# Patient Record
Sex: Male | Born: 1950 | Race: White | Hispanic: No | Marital: Single | Smoking: Current every day smoker
Health system: Southern US, Community
[De-identification: ages and names within clinical notes are randomized; demographics above are authoritative.]

## PROBLEM LIST (undated history)

## (undated) ENCOUNTER — Emergency Department (HOSPITAL_COMMUNITY): Payer: Commercial Managed Care - HMO

## (undated) DIAGNOSIS — B029 Zoster without complications: Secondary | ICD-10-CM

## (undated) DIAGNOSIS — I10 Essential (primary) hypertension: Secondary | ICD-10-CM

## (undated) DIAGNOSIS — F419 Anxiety disorder, unspecified: Secondary | ICD-10-CM

## (undated) DIAGNOSIS — M25519 Pain in unspecified shoulder: Secondary | ICD-10-CM

---

## 1999-02-04 ENCOUNTER — Emergency Department (HOSPITAL_COMMUNITY): Admission: EM | Admit: 1999-02-04 | Discharge: 1999-02-04 | Payer: Self-pay | Admitting: Emergency Medicine

## 2000-10-23 ENCOUNTER — Emergency Department (HOSPITAL_COMMUNITY): Admission: EM | Admit: 2000-10-23 | Discharge: 2000-10-23 | Payer: Self-pay | Admitting: Emergency Medicine

## 2000-11-25 ENCOUNTER — Emergency Department (HOSPITAL_COMMUNITY): Admission: EM | Admit: 2000-11-25 | Discharge: 2000-11-25 | Payer: Self-pay | Admitting: Emergency Medicine

## 2000-11-25 ENCOUNTER — Encounter: Payer: Self-pay | Admitting: Emergency Medicine

## 2000-12-21 ENCOUNTER — Encounter: Payer: Self-pay | Admitting: Emergency Medicine

## 2000-12-21 ENCOUNTER — Emergency Department (HOSPITAL_COMMUNITY): Admission: EM | Admit: 2000-12-21 | Discharge: 2000-12-21 | Payer: Self-pay | Admitting: Emergency Medicine

## 2001-04-03 ENCOUNTER — Encounter: Admission: RE | Admit: 2001-04-03 | Discharge: 2001-04-03 | Payer: Self-pay | Admitting: Internal Medicine

## 2001-11-21 ENCOUNTER — Encounter: Admission: RE | Admit: 2001-11-21 | Discharge: 2001-11-21 | Payer: Self-pay

## 2002-08-10 ENCOUNTER — Emergency Department (HOSPITAL_COMMUNITY): Admission: EM | Admit: 2002-08-10 | Discharge: 2002-08-10 | Payer: Self-pay | Admitting: Emergency Medicine

## 2002-08-10 ENCOUNTER — Encounter: Payer: Self-pay | Admitting: Emergency Medicine

## 2002-08-12 ENCOUNTER — Emergency Department (HOSPITAL_COMMUNITY): Admission: EM | Admit: 2002-08-12 | Discharge: 2002-08-12 | Payer: Self-pay | Admitting: Emergency Medicine

## 2002-08-16 ENCOUNTER — Emergency Department (HOSPITAL_COMMUNITY): Admission: EM | Admit: 2002-08-16 | Discharge: 2002-08-16 | Payer: Self-pay | Admitting: Emergency Medicine

## 2002-09-13 ENCOUNTER — Encounter: Payer: Self-pay | Admitting: Emergency Medicine

## 2002-09-13 ENCOUNTER — Emergency Department (HOSPITAL_COMMUNITY): Admission: EM | Admit: 2002-09-13 | Discharge: 2002-09-13 | Payer: Self-pay | Admitting: Emergency Medicine

## 2002-09-27 ENCOUNTER — Encounter: Payer: Self-pay | Admitting: Emergency Medicine

## 2002-09-27 ENCOUNTER — Emergency Department (HOSPITAL_COMMUNITY): Admission: EM | Admit: 2002-09-27 | Discharge: 2002-09-27 | Payer: Self-pay | Admitting: Emergency Medicine

## 2002-11-08 ENCOUNTER — Encounter: Payer: Self-pay | Admitting: Emergency Medicine

## 2002-11-08 ENCOUNTER — Emergency Department (HOSPITAL_COMMUNITY): Admission: EM | Admit: 2002-11-08 | Discharge: 2002-11-08 | Payer: Self-pay | Admitting: Emergency Medicine

## 2003-07-09 ENCOUNTER — Emergency Department (HOSPITAL_COMMUNITY): Admission: EM | Admit: 2003-07-09 | Discharge: 2003-07-09 | Payer: Self-pay | Admitting: Emergency Medicine

## 2003-07-09 ENCOUNTER — Encounter: Payer: Self-pay | Admitting: Emergency Medicine

## 2003-11-21 ENCOUNTER — Emergency Department (HOSPITAL_COMMUNITY): Admission: EM | Admit: 2003-11-21 | Discharge: 2003-11-21 | Payer: Self-pay | Admitting: Emergency Medicine

## 2003-12-13 ENCOUNTER — Emergency Department (HOSPITAL_COMMUNITY): Admission: EM | Admit: 2003-12-13 | Discharge: 2003-12-13 | Payer: Self-pay | Admitting: Emergency Medicine

## 2003-12-15 ENCOUNTER — Emergency Department (HOSPITAL_COMMUNITY): Admission: EM | Admit: 2003-12-15 | Discharge: 2003-12-15 | Payer: Self-pay | Admitting: Emergency Medicine

## 2004-09-13 ENCOUNTER — Emergency Department (HOSPITAL_COMMUNITY): Admission: EM | Admit: 2004-09-13 | Discharge: 2004-09-13 | Payer: Self-pay | Admitting: Emergency Medicine

## 2005-07-03 ENCOUNTER — Emergency Department (HOSPITAL_COMMUNITY): Admission: EM | Admit: 2005-07-03 | Discharge: 2005-07-03 | Payer: Self-pay | Admitting: Emergency Medicine

## 2006-01-10 ENCOUNTER — Emergency Department (HOSPITAL_COMMUNITY): Admission: EM | Admit: 2006-01-10 | Discharge: 2006-01-10 | Payer: Self-pay | Admitting: Emergency Medicine

## 2006-07-24 ENCOUNTER — Ambulatory Visit: Payer: Self-pay | Admitting: Internal Medicine

## 2006-08-23 ENCOUNTER — Ambulatory Visit: Payer: Self-pay | Admitting: Internal Medicine

## 2007-08-30 ENCOUNTER — Emergency Department (HOSPITAL_COMMUNITY): Admission: EM | Admit: 2007-08-30 | Discharge: 2007-08-30 | Payer: Self-pay | Admitting: Emergency Medicine

## 2009-08-15 ENCOUNTER — Emergency Department (HOSPITAL_COMMUNITY): Admission: EM | Admit: 2009-08-15 | Discharge: 2009-08-15 | Payer: Self-pay | Admitting: Emergency Medicine

## 2010-10-13 ENCOUNTER — Emergency Department (HOSPITAL_COMMUNITY)
Admission: EM | Admit: 2010-10-13 | Discharge: 2010-10-13 | Payer: Self-pay | Source: Home / Self Care | Admitting: Family Medicine

## 2011-02-01 LAB — URINALYSIS, ROUTINE W REFLEX MICROSCOPIC
Glucose, UA: NEGATIVE mg/dL
Nitrite: NEGATIVE
Protein, ur: NEGATIVE mg/dL
Urobilinogen, UA: 1 mg/dL (ref 0.0–1.0)
pH: 6 (ref 5.0–8.0)

## 2011-02-01 LAB — URINE CULTURE: Colony Count: NO GROWTH

## 2011-02-01 LAB — VALPROIC ACID LEVEL: Valproic Acid Lvl: 65.4 ug/mL (ref 50.0–100.0)

## 2011-02-01 LAB — POCT I-STAT, CHEM 8
BUN: 16 mg/dL (ref 6–23)
Calcium, Ion: 1.15 mmol/L (ref 1.12–1.32)
Glucose, Bld: 175 mg/dL — ABNORMAL HIGH (ref 70–99)
HCT: 48 % (ref 39.0–52.0)
TCO2: 27 mmol/L (ref 0–100)

## 2011-03-16 NOTE — Assessment & Plan Note (Signed)
[facility]                               PULMONARY OFFICE NOTE   NAME:Bryan Davies, Bryan Davies                        MRN:          [id number]  DATE:07/24/2006                            DOB:          28-Apr-1951    CHIEF COMPLAINT:  Cough.   HISTORY:  A 60 year old white male, active smoker, complaining of coughing  and dyspnea for the last 2 years, worse in the morning and has progressed to  the point where he is having trouble making it to the [employer] which is a  25-minute walk from his house.  It does appear to be worse in cold weather  than in warm.  The dyspnea is not associated with any exertional chest pain,  fever, chills, orthopnea, PND, or leg swelling.  His sputum consists of  thick white mucus with no purulence or hemoptysis.   He does have a history of mild chronic nasal congestion but no classic  itching, sneezing, or subjective wheezing.   PAST MEDICAL HISTORY:  Significant for hypertension and bipolar disorder.   ALLERGIES:  None known.   MEDICATIONS:  Valproic acid and chlorpromazine.   SOCIAL HISTORY:  He continues to smoke a pack-and-a-half per day, says his  roommate.  He is on disability for bipolar disorder.   FAMILY HISTORY:  Taken in detail and significant for the fact that his  father had lung cancer, was a smoker.   REVIEW OF SYSTEMS:  Taken in detail on the worksheet and significant for the  problems outlined above.   PHYSICAL EXAMINATION:  GENERAL:  This is a pleasant, ambulatory white male  who appears much older than stated age.  He is afebrile, normal vital signs.  HEENT:  Reveals poor dentition.  Neck supple without cervical adenopathy or  tenderness.  Trachea is midline without thyromegaly.  Nasal turbinates  normal.  Ear canals clear bilaterally.  LUNG FIELDS:  Reveal decreased breath sounds with hyperresonance to  percussion and moderate increase in expiratory time but no true wheezes.  HEART:  He has a  regular rhythm without murmur, gallop, or rub.  ABDOMEN:  Soft, benign.  EXTREMITIES:  Warm without calf tenderness, cyanosis, clubbing, or edema.   Chest x-ray is reviewed from January 10, 2006, and reveals mild COPD only.   IMPRESSION:  Chronic obstructive pulmonary disease, mostly consisting of  chronic bronchitic features with not much in terms in air flow obstruction  or variability to suggest significant emphysema or asthma.  For now I  believe bronchitis without significant bronchospasm is the main problem and  recommended therefore Mucinex DM two b.i.d. and emphasized efforts toward  smoking cessation as the primary goal of therapy here.  In fact, I told him  without smoking cessation there was very little I could do to either control  his cough or reverse the decline he will experience in terms of reduction in  best-day activity tolerance over the next 5-10 years.   I would like him to return for PFTs in 6 weeks and in the meantime work  harder on the issue of smoking  cessation that we discussed at length today.  Emphasized that he needs to discuss the smoking issue, which I believe is  entirely behavioral, with Behavioral Health.            ______________________________  Charlaine Dalton. Sherene Sires, MD, [facility]      MBW/MedQ  DD:  07/24/2006  DT:  07/26/2006  Job #:  [id number]   cc:   Talmadge Coventry, M.D.  Behavioral Health

## 2011-03-16 NOTE — Assessment & Plan Note (Signed)
[facility]                               PULMONARY OFFICE NOTE   NAME:Bryan Davies, Bryan Davies                        MRN:          [id number]  DATE:08/23/2006                            DOB:          1950-11-26    HISTORY:  A 60 year old white male active smoker with chronic cough  requesting inhalers and a cough medicine that my insurance will pay for.  He was seen initially on July 24, 2006, complaining of a chronic cough  and dyspnea with exertion for 2 years, worse in the morning, and typical of  a COPD/chronic bronchitic.  His chest x-ray revealed COPD only and it was  strongly recommended that he commit to quit smoking and then return for  followup PFTs.  He did not do either one, but returns today complaining that  his insurance will not pay for the Mucinex that I recommended and wants to  know if he could have an inhaler.   PHYSICAL EXAMINATION:  GENERAL:  He is a hoarse, ambulatory white male who  smells heavily of cigarettes.  He is afebrile with normal vital signs.  HEENT:  Unremarkable.  Pharynx clear.  LUNG FIELDS:  Reveal junky rhonchi on FVC maneuver, but no true wheezes.  HEART:  He has a regular rhythm without murmur, gallop, rub.  ABDOMEN:  Soft, benign.  EXTREMITIES:  Warm without calf tenderness, cyanosis, clubbing.   IMPRESSION:  1. Chronic obstructive pulmonary disease with chronic bronchitic features.      The only proven effective way to stop the natural history of the      disease is to stop smoking, and to not see specialists or use      inhalers.  However, I note this patient appears to have significant      psychologic dysfunction and therefore I am going to refer him back to      [facility] for consideration of smoking cessation treatment      including possibly using Wellbutrin and Chantix together (I would not      even attempt to do so in a patient who has underlying ongoing      psychiatric issues, however).  2. I pointed out to the patient that if he stopped smoking he probably      will not need inhalers, but in the meantime to treat him      symptomatically I did give a prescription for Combivent two puffs every      4 p.r.n., guaifenesin DM two b.i.d. p.r.n.   I have requested again that the patient return for a set of PFTs for  baseline purposes and have scheduled him for this as well.    ______________________________  Charlaine Dalton. Sherene Sires, MD, [facility]    MBW/MedQ  DD: 08/23/2006  DT: 08/26/2006  Job #: [id number]   cc:   Talmadge Coventry, M.D.

## 2011-07-12 ENCOUNTER — Inpatient Hospital Stay (INDEPENDENT_AMBULATORY_CARE_PROVIDER_SITE_OTHER)
Admission: RE | Admit: 2011-07-12 | Discharge: 2011-07-12 | Disposition: A | Discharge status: Home / Self Care | Payer: Medicare Other | Source: Ambulatory Visit | Attending: Family Medicine | Admitting: Family Medicine

## 2011-07-12 DIAGNOSIS — R7989 Other specified abnormal findings of blood chemistry: Secondary | ICD-10-CM

## 2011-07-12 LAB — POCT URINALYSIS DIP (DEVICE)
Glucose, UA: NEGATIVE mg/dL
Nitrite: NEGATIVE
Protein, ur: NEGATIVE mg/dL
Specific Gravity, Urine: 1.015 (ref 1.005–1.030)
Urobilinogen, UA: 1 mg/dL (ref 0.0–1.0)

## 2011-10-30 ENCOUNTER — Encounter: Payer: Self-pay | Admitting: Nurse Practitioner

## 2011-10-30 ENCOUNTER — Emergency Department (HOSPITAL_COMMUNITY)
Admission: EM | Admit: 2011-10-30 | Discharge: 2011-10-30 | Disposition: A | Discharge status: Home / Self Care | Payer: Medicare Other | Attending: Emergency Medicine | Admitting: Emergency Medicine

## 2011-10-30 DIAGNOSIS — M549 Dorsalgia, unspecified: Secondary | ICD-10-CM | POA: Insufficient documentation

## 2011-10-30 DIAGNOSIS — R079 Chest pain, unspecified: Secondary | ICD-10-CM | POA: Insufficient documentation

## 2011-10-30 DIAGNOSIS — B029 Zoster without complications: Secondary | ICD-10-CM

## 2011-10-30 DIAGNOSIS — F17200 Nicotine dependence, unspecified, uncomplicated: Secondary | ICD-10-CM | POA: Insufficient documentation

## 2011-10-30 HISTORY — DX: Zoster without complications: B02.9

## 2011-10-30 HISTORY — DX: Anxiety disorder, unspecified: F41.9

## 2011-10-30 MED ORDER — HYDROCODONE-ACETAMINOPHEN 5-500 MG PO TABS: 1.0000 | ORAL_TABLET | Freq: Four times a day (QID) | ORAL | Status: AC | PRN

## 2011-10-30 MED ORDER — ACYCLOVIR 400 MG PO TABS: 400.0000 mg | ORAL_TABLET | Freq: Four times a day (QID) | ORAL | Status: AC

## 2011-10-30 MED ORDER — PREDNISONE 20 MG PO TABS: 40.0000 mg | ORAL_TABLET | Freq: Every day | ORAL | Status: AC

## 2011-10-30 NOTE — ED Provider Notes (Signed)
History     CSN: [id number]  Arrival date & time 10/30/11  1357   First MD Initiated Contact with Patient 10/30/11 1421      Chief Complaint  Patient presents with  . Rash    (Consider location/radiation/quality/duration/timing/severity/associated sxs/prior treatment) Patient is a 61 y.o. male presenting with rash. The history is provided by the patient.  Rash  This is a new problem. The current episode started 2 days ago. The problem has been gradually worsening. The problem is associated with nothing. There has been no fever. The rash is present on the torso. The pain is at a severity of 6/10. The pain is moderate. The pain has been constant since onset. Associated symptoms include blisters and pain. He has tried nothing for the symptoms. The treatment provided no relief.    Past Medical History  Diagnosis Date  . Anxiety     History reviewed. No pertinent past surgical history.  History reviewed. No pertinent family history.  History  Substance Use Topics  . Smoking status: Current Everyday Smoker -- 1.0 packs/day  . Smokeless tobacco: Not on file  . Alcohol Use: No      Review of Systems  Skin: Positive for rash.  All other systems reviewed and are negative.    Allergies  Review of patient's allergies indicates no known allergies.  Home Medications   Current Outpatient Rx  Name Route Sig Dispense Refill  . IBUPROFEN 200 MG PO TABS Oral Take 600 mg by mouth every 6 (six) hours as needed. For headache.     Marland Kitchen VALPROIC ACID 250 MG PO CAPS Oral Take 750 mg by mouth 2 (two) times daily.       BP 145/102  Pulse 118  Temp(Src) 98.2 F (36.8 C) (Oral)  Resp 18  SpO2 97%  Physical Exam  Nursing note and vitals reviewed. Constitutional: He is oriented to person, place, and time. He appears well-developed and well-nourished. No distress.  HENT:  Head: Normocephalic and atraumatic.  Eyes: EOM are normal. Pupils are equal, round, and reactive to light.    Neurological: He is alert and oriented to person, place, and time.  Skin: Skin is warm and dry. Rash noted. Rash is maculopapular and vesicular.     Psychiatric: He has a normal mood and affect. His behavior is normal.    ED Course  Procedures (including critical care time)  Labs Reviewed - No data to display No results found.   No diagnosis found.    MDM   Patient with evidence of zoster. No other associated symptoms. Will treat with acyclovir and steroids.        Gwyneth Sprout, MD 10/30/11 1439

## 2011-10-30 NOTE — ED Notes (Signed)
Felt something itchy on chest and noticed rash on R chest. No new products or medications. Denies pain. Rash is not anywhere else on his body

## 2011-12-08 ENCOUNTER — Encounter (HOSPITAL_COMMUNITY): Payer: Self-pay | Admitting: *Deleted

## 2011-12-08 ENCOUNTER — Emergency Department (HOSPITAL_COMMUNITY)
Admission: EM | Admit: 2011-12-08 | Discharge: 2011-12-08 | Disposition: A | Discharge status: Home / Self Care | Payer: Medicare Other | Attending: Emergency Medicine | Admitting: Emergency Medicine

## 2011-12-08 DIAGNOSIS — F17200 Nicotine dependence, unspecified, uncomplicated: Secondary | ICD-10-CM | POA: Insufficient documentation

## 2011-12-08 DIAGNOSIS — B0229 Other postherpetic nervous system involvement: Secondary | ICD-10-CM | POA: Insufficient documentation

## 2011-12-08 DIAGNOSIS — R079 Chest pain, unspecified: Secondary | ICD-10-CM | POA: Insufficient documentation

## 2011-12-08 HISTORY — DX: Zoster without complications: B02.9

## 2011-12-08 MED ORDER — PREDNISONE 20 MG PO TABS: ORAL_TABLET | ORAL | Status: AC

## 2011-12-08 MED ORDER — GABAPENTIN 100 MG PO CAPS: 100.0000 mg | ORAL_CAPSULE | Freq: Three times a day (TID) | ORAL | Status: DC

## 2011-12-08 MED ORDER — OXYCODONE-ACETAMINOPHEN 5-325 MG PO TABS: 1.0000 | ORAL_TABLET | Freq: Four times a day (QID) | ORAL | Status: AC | PRN

## 2011-12-08 NOTE — ED Provider Notes (Signed)
History     CSN: [id number]  Arrival date & time 12/08/11  1104   First MD Initiated Contact with Patient 12/08/11 1229      Chief Complaint  Patient presents with  . Herpes Zoster    (Consider location/radiation/quality/duration/timing/severity/associated sxs/prior treatment) HPI  61yo male with recent diagnosis of shingle 3 months ago is presenting complaining of persisting burning sensation to his right mid chest in the same location of this shingle rash. Patient states he was treated with a course of antiviral medication, along with pain medication for one month. Medication initially helped but continues to endorse the same burning sensation. He denies fever, shortness of breath, or any other rash. He denies any change in his medication, chest and exertion, having new pets. Patient does not have a primary care Dr.  Past Medical History  Diagnosis Date  . Anxiety   . Shingles     History reviewed. No pertinent past surgical history.  History reviewed. No pertinent family history.  History  Substance Use Topics  . Smoking status: Current Everyday Smoker -- 1.0 packs/day  . Smokeless tobacco: Not on file  . Alcohol Use: No      Review of Systems  All other systems reviewed and are negative.    Allergies  Review of patient's allergies indicates no known allergies.  Home Medications   Current Outpatient Rx  Name Route Sig Dispense Refill  . IBUPROFEN 200 MG PO TABS Oral Take 600 mg by mouth every 6 (six) hours as needed. For headache.     Marland Kitchen VALPROIC ACID 250 MG PO CAPS Oral Take 750 mg by mouth 2 (two) times daily.       BP 135/100  Pulse 108  Temp(Src) 98.3 F (36.8 C) (Oral)  Resp 20  SpO2 98%  Physical Exam  Nursing note and vitals reviewed. Constitutional: He appears well-developed and well-nourished. No distress.  HENT:  Head: Atraumatic.  Eyes: Conjunctivae are normal.  Pulmonary/Chest: Effort normal. No respiratory distress. He exhibits  tenderness.  Abdominal: Soft.  Musculoskeletal: Normal range of motion.  Neurological: He is alert.  Skin: Rash noted.       ED Course  Procedures (including critical care time)  Labs Reviewed - No data to display No results found.   No diagnosis found.    MDM  Patient with recently diagnosed shingle. He is presenting with postherpetic neuralgia. His rash is resolving. There is no active lesion. Patient will be treated with pain medication, prednisone taper course, and Neurontin. I will also get patient's resources to find a primary care Dr. for further management of his condition.        Fayrene Helper, PA-C 12/08/11 1254

## 2011-12-08 NOTE — ED Notes (Signed)
Reports being diagnosed with shingles 3 months ago and still having severe pain to right side, a burning pain. Ambulatory at triage, no acute distress noted.

## 2011-12-08 NOTE — ED Provider Notes (Signed)
Medical screening examination/treatment/procedure(s) were conducted as a shared visit with non-physician practitioner(s) and myself.  I personally evaluated the patient during the encounter  Patient zoster rash for the past 3 months. Continued pain and burning. He's completed a course of antiviral medication.  resolving lesions. Tender to palpation.  Postherpetic neuralgia. Will be placed on Neurontin, steroids, pain medication.  Dayton Bailiff, MD 12/08/11 1311

## 2012-05-21 ENCOUNTER — Ambulatory Visit (INDEPENDENT_AMBULATORY_CARE_PROVIDER_SITE_OTHER): Payer: Medicare Other | Admitting: Family Medicine

## 2012-05-21 ENCOUNTER — Encounter: Payer: Self-pay | Admitting: Family Medicine

## 2012-05-21 VITALS — BP 160/99 | HR 89 | Ht 67.0 in | Wt 171.0 lb

## 2012-05-21 DIAGNOSIS — B0229 Other postherpetic nervous system involvement: Secondary | ICD-10-CM

## 2012-05-21 DIAGNOSIS — F17200 Nicotine dependence, unspecified, uncomplicated: Secondary | ICD-10-CM

## 2012-05-21 DIAGNOSIS — F319 Bipolar disorder, unspecified: Secondary | ICD-10-CM

## 2012-05-21 DIAGNOSIS — I10 Essential (primary) hypertension: Secondary | ICD-10-CM

## 2012-05-21 DIAGNOSIS — Z720 Tobacco use: Secondary | ICD-10-CM | POA: Insufficient documentation

## 2012-05-21 MED ORDER — GABAPENTIN 100 MG PO CAPS: 300.0000 mg | ORAL_CAPSULE | Freq: Three times a day (TID) | ORAL | Status: DC

## 2012-05-21 MED ORDER — CHLORTHALIDONE 25 MG PO TABS: 25.0000 mg | ORAL_TABLET | Freq: Every day | ORAL | Status: DC

## 2012-05-21 NOTE — Patient Instructions (Signed)
Dear Mr. Alesi,   Thank you for coming to clinic today. Please read below regarding the issues that we discussed.   1. Hypertension - Please start taking the medication called chlorthalidone. Take one pill daily.   2. Shingle Pain - Start the medication called gabapentin. Take 1 pill every 8 hours, but start with only 1 pill on the first day just to make sure it doesn't make you too tired.   Please follow up in clinic in 4 weeks . Please call earlier if you have any questions or concerns.   Sincerely,   Dr. Clinton Sawyer

## 2012-05-22 ENCOUNTER — Encounter: Payer: Self-pay | Admitting: Family Medicine

## 2012-05-22 NOTE — Assessment & Plan Note (Signed)
Offered assistance with quitting. Patient was thankful, but not ready to quit. In pre-contemplation stage. If he does want to try, then I will likely use a combination of nicotine patches and gum. I will not use Chantix with his bipolar disorder.

## 2012-05-22 NOTE — Progress Notes (Signed)
  Subjective:    Patient ID: Bryan Davies, male    DOB: 12-04-1950, 61 y.o.   MRN: [id number]  HPI  61 year old man with bipolar disorder who was instrurcted by his mental health provider to establish care with a PCP.   1. Right sided abdominal pain - in a distribution along his his right flank under his right pectoral, pain started 7 months ago when he developed shingles and since that time has been having pain exacerbated by movement and touch; was relieved by gabapentin prescribed several months ago by another provider but has not received a refill   2. High Blood Pressure - He was told by his mental health provider that his BP was high; denies a history of being on BP medication; no history of MI or CVA; denies chest pain, shortness of breath, TIA like symptoms  Review of Systems  All other systems reviewed and are negative.       Objective:   Physical Exam  Constitutional: He appears well-developed and well-nourished. No distress.       Smells strongly of cigarette smoke  HENT:  Head: Normocephalic and atraumatic.  Eyes: Pupils are equal, round, and reactive to light.  Cardiovascular: Normal rate, regular rhythm and normal heart sounds.   Pulmonary/Chest: Effort normal and breath sounds normal.  Skin: Lesion noted. No laceration noted. He is not diaphoretic.     Psychiatric: He has a normal mood and affect. Thought content normal.          Assessment & Plan:  61 year old man with bipolar, HTN, and post-herpectic neuralgia who is establishing care.

## 2012-05-22 NOTE — Assessment & Plan Note (Addendum)
Never been on medication, but has likely had essential hypertension for a prolonged period of time. Started with chlorthalidone 25 mg on 05/21/12. Check screening labs at upcoming visit.

## 2012-05-22 NOTE — Assessment & Plan Note (Signed)
Start Neurotin 300 mg TID. If this is ineffective, consider topical capsaicin and lidocaine. Additionally, I could try a TCA, but I'm hesitant to do so until I know more about his bipolar disorder.

## 2012-06-20 ENCOUNTER — Ambulatory Visit: Payer: Medicare Other | Admitting: Family Medicine

## 2012-07-01 ENCOUNTER — Ambulatory Visit (INDEPENDENT_AMBULATORY_CARE_PROVIDER_SITE_OTHER): Payer: Medicare Other | Admitting: Family Medicine

## 2012-07-01 ENCOUNTER — Encounter: Payer: Self-pay | Admitting: Family Medicine

## 2012-07-01 VITALS — BP 118/78 | HR 85 | Ht 67.0 in | Wt 166.2 lb

## 2012-07-01 DIAGNOSIS — B0229 Other postherpetic nervous system involvement: Secondary | ICD-10-CM

## 2012-07-01 DIAGNOSIS — I10 Essential (primary) hypertension: Secondary | ICD-10-CM

## 2012-07-01 DIAGNOSIS — Z720 Tobacco use: Secondary | ICD-10-CM

## 2012-07-01 DIAGNOSIS — F17200 Nicotine dependence, unspecified, uncomplicated: Secondary | ICD-10-CM

## 2012-07-01 LAB — BASIC METABOLIC PANEL
CO2: 29 mEq/L (ref 19–32)
Calcium: 9.5 mg/dL (ref 8.4–10.5)
Creat: 1.41 mg/dL — ABNORMAL HIGH (ref 0.50–1.35)
Glucose, Bld: 98 mg/dL (ref 70–99)
Sodium: 132 mEq/L — ABNORMAL LOW (ref 135–145)

## 2012-07-01 NOTE — Patient Instructions (Signed)
The [facility] QuitLine telephone number is 1-800-QUIT-NOW ([phone]).  Be sure to ask them about free patches.  Please follow up in one month.   Sincerely,   Dr. Clinton Sawyer

## 2012-07-02 ENCOUNTER — Encounter: Payer: Self-pay | Admitting: Family Medicine

## 2012-07-02 NOTE — Assessment & Plan Note (Signed)
Continue gabapentin 300 mg 3 times daily. 

## 2012-07-02 NOTE — Progress Notes (Signed)
  Subjective:    Patient ID: Bryan Davies, male    DOB: 10-31-1950, 61 y.o.   MRN: [id number]  HPI  Bryan Davies is a 61 year old male with bipolar disorder, tobacco abuse disorder, and hypertension who presents for guidance and smoking cessation. The patient has smoked since the age of 12 approximately one pack of cigarettes a day, which equates to 50 pack years. He has never tried quitting before. He is interested and has only smoked one cigarette today. He does not want to purchase another packet would like help decreasing his cravings. He is interested in using nicotine replacement with the patch or gum, like something that is affordable.  Review of Systems On his right flank from postherpetic neuralgia, currently an 8/10    Objective:   Physical Exam  BP 118/78  Pulse 85  Ht 5\' 7"  (1.702 m)  Wt 166 lb 3.2 oz (75.388 kg)  BMI 26.03 kg/m2 General: Alert and oriented x4 nondistressed middle-aged white male Cardiovascular: Regular Rate and rhythm Pulmonary: Lungs clear to auscultation bilaterally Psych: Normal mood, thought content, and behavior      Assessment & Plan:  61 year old male with past medical history of a 50-pack-year history of smoking would like smoking cessation. He was advised By one of our pharmacy residents space about 1 800 quit line. He was agreeable to this plan.

## 2012-07-02 NOTE — Assessment & Plan Note (Signed)
It is very encouraging the patient is willing to quit at this point. He is a poor candidate for medical therapy this was psychiatric history. She would like to use nicotine replacement therapy. Therefore he was encouraged to call 1 800 Quit now. History source radial to provide him with free nicotine patches. Otherwise he will purchase the 14 mg nicotine patches and use it daily for one month.

## 2012-07-02 NOTE — Addendum Note (Signed)
Addended by: Garnetta Buddy on: 07/02/2012 09:48 PM   Modules accepted: Level of Service

## 2012-07-02 NOTE — Assessment & Plan Note (Signed)
Well controlled on chlorthalidone. We'll check basic metabolic panel today.

## 2012-07-03 ENCOUNTER — Encounter: Payer: Self-pay | Admitting: Family Medicine

## 2012-07-14 ENCOUNTER — Emergency Department (HOSPITAL_COMMUNITY)
Admission: EM | Admit: 2012-07-14 | Discharge: 2012-07-14 | Disposition: A | Discharge status: Home / Self Care | Payer: Medicare Other | Attending: Emergency Medicine | Admitting: Emergency Medicine

## 2012-07-14 ENCOUNTER — Encounter (HOSPITAL_COMMUNITY): Payer: Self-pay

## 2012-07-14 DIAGNOSIS — R079 Chest pain, unspecified: Secondary | ICD-10-CM | POA: Insufficient documentation

## 2012-07-14 DIAGNOSIS — X58XXXA Exposure to other specified factors, initial encounter: Secondary | ICD-10-CM | POA: Insufficient documentation

## 2012-07-14 DIAGNOSIS — S40029A Contusion of unspecified upper arm, initial encounter: Secondary | ICD-10-CM | POA: Insufficient documentation

## 2012-07-14 NOTE — ED Notes (Signed)
Pt reports (L) arm pain starting today, bruise noted to LAC, pt denies any injury, pt also reports (R) rib pain x11 months, pt reports the pain started after his episode of shingles 11 months ago. Pt prescribed Gabapentin for pain w/no relief. Pt smells of ETOH

## 2012-07-14 NOTE — ED Notes (Signed)
The patient was AOx4 and stable at the time he decided to leave with being treated.

## 2012-07-15 ENCOUNTER — Emergency Department (HOSPITAL_COMMUNITY)
Admission: EM | Admit: 2012-07-15 | Discharge: 2012-07-15 | Disposition: A | Discharge status: Home / Self Care | Payer: Medicare Other | Attending: Emergency Medicine | Admitting: Emergency Medicine

## 2012-07-15 ENCOUNTER — Encounter (HOSPITAL_COMMUNITY): Payer: Self-pay | Admitting: Emergency Medicine

## 2012-07-15 DIAGNOSIS — S5010XA Contusion of unspecified forearm, initial encounter: Secondary | ICD-10-CM | POA: Insufficient documentation

## 2012-07-15 DIAGNOSIS — X58XXXA Exposure to other specified factors, initial encounter: Secondary | ICD-10-CM | POA: Insufficient documentation

## 2012-07-15 DIAGNOSIS — T148XXA Other injury of unspecified body region, initial encounter: Secondary | ICD-10-CM

## 2012-07-15 DIAGNOSIS — B0229 Other postherpetic nervous system involvement: Secondary | ICD-10-CM

## 2012-07-15 MED ORDER — HYDROCODONE-ACETAMINOPHEN 5-325 MG PO TABS: ORAL_TABLET | ORAL | Status: DC

## 2012-07-15 NOTE — ED Notes (Signed)
Pt here for bruises to right arm unkown how they got there and pain in right ribs from recent shingles.

## 2012-07-15 NOTE — ED Notes (Addendum)
Pt sts RUQ pain into rib where pt sts had shingles 10 months ago is still having pain; pt sts two bruises on arm unsure of how he got them; pt seen here recently for same

## 2012-07-15 NOTE — ED Provider Notes (Signed)
History     CSN: [id number]  Arrival date & time 07/15/12  7829   First MD Initiated Contact with Patient 07/15/12 1000      Chief Complaint  Patient presents with  . Bleeding/Bruising  . Pain    (Consider location/radiation/quality/duration/timing/severity/associated sxs/prior treatment) HPI Comments: Patient with history of shingles, Jan 2013, presents with chief complaint of continuing pain at shingles site and two bruises on his left forearm. Patient states he has seen his primary care doctor for his continuing shingle pain. He has been using Neurontin which has not provided relief. He reports the pain being constant and describes it as a stabbing sensation. He has not found anything that makes the pain better. Course is constant.   He also complains of two bruises on his left forearm that he noticed yesterday after fixing a friends vacuum. He states the bruises hurt yesterday but are fine today. He has not noticed bruising anywhere else on his body. He denies fever, fatigue, dizziness, shortness of breath, bleeding from his gums or blood in his stool. He has a history of hypertension.   The history is provided by the patient.    Past Medical History  Diagnosis Date  . Anxiety   . Shingles 2013    History reviewed. No pertinent past surgical history.  History reviewed. No pertinent family history.  History  Substance Use Topics  . Smoking status: Current Every Day Smoker -- 1.0 packs/day for 50 years  . Smokeless tobacco: Not on file   Comment: has not smoked since this morning. only one cig/today  . Alcohol Use: No      Review of Systems  Constitutional: Negative for fever and fatigue.  HENT: Negative for sore throat and rhinorrhea.   Eyes: Negative for redness.  Respiratory: Negative for cough.   Cardiovascular: Negative for chest pain.  Gastrointestinal: Positive for abdominal pain (skin/superficial, RUQ ). Negative for nausea, vomiting, diarrhea and blood in  stool.  Musculoskeletal: Negative for myalgias.  Skin: Positive for color change. Negative for rash.  Neurological: Negative for headaches.  Hematological: Does not bruise/bleed easily.    Allergies  Review of patient's allergies indicates no known allergies.  Home Medications   Current Outpatient Rx  Name Route Sig Dispense Refill  . CHLORTHALIDONE 25 MG PO TABS Oral Take 1 tablet (25 mg total) by mouth daily. 30 tablet 5  . GABAPENTIN 100 MG PO CAPS Oral Take 3 capsules (300 mg total) by mouth 3 (three) times daily. 120 capsule 5  . IBUPROFEN 200 MG PO TABS Oral Take 600 mg by mouth every 6 (six) hours as needed. For headache.     Marland Kitchen VALPROIC ACID 250 MG PO CAPS Oral Take 750 mg by mouth 2 (two) times daily.       BP 149/79  Pulse 93  Temp 97.6 F (36.4 C) (Oral)  Resp 16  SpO2 96%  Physical Exam  Nursing note and vitals reviewed. Constitutional: He appears well-developed and well-nourished.       In no acute distress  HENT:  Head: Normocephalic and atraumatic.  Eyes: Conjunctivae normal are normal.  Neck: Normal range of motion. Neck supple.  Cardiovascular: Normal rate, regular rhythm, normal heart sounds and intact distal pulses.   No murmur heard. Pulmonary/Chest: Effort normal and breath sounds normal. No respiratory distress. He has no wheezes.  Abdominal:       Right inferior ribs, pain/irritation with light touch. Skin appears normal. Contact of shirt does not seem to  bother patient.   Musculoskeletal: Normal range of motion.  Neurological: He is alert.  Skin: Skin is warm and dry. Bruising (two 1cm macular ecchymosis) noted.          two 1cm ecchymosis without bleeding or surrounding erythema  Psychiatric: He has a normal mood and affect.    ED Course  Procedures (including critical care time)  Labs Reviewed - No data to display No results found.   1. Post herpetic neuralgia   2. Bruising     10:57 AM Patient seen and examined.  Will prescribe  pain medication for severe pain. Otherwise, urged PCP follow-up. Patient verbalizes understanding and agrees with plan.    Vital signs reviewed and are as follows: Filed Vitals:   07/15/12 0904  BP: 149/79  Pulse: 93  Temp: 97.6 F (36.4 C)  Resp: 16   Patient counseled on use of narcotic pain medications. Counseled not to combine these medications with others containing tylenol. Urged not to drink alcohol, drive, or perform any other activities that requires focus while taking these medications. The patient verbalizes understanding and agrees with the plan.    MDM  Bruising: unclear etiology, although non-tender, improving, and not elsewhere on body. Reassured and ask patient to monitor for resolution.  Post-herpetic neuralgia: PCP follow-up as neurontin does not seem to be helping. Pain medication for severe pain.           [place], [place] 07/15/12 [phone]

## 2012-07-20 NOTE — ED Provider Notes (Signed)
Medical screening examination/treatment/procedure(s) were performed by non-physician practitioner and as supervising physician I was immediately available for consultation/collaboration.  Anais Koenen L Tessa Seaberry, MD 07/20/12 0111 

## 2012-07-29 ENCOUNTER — Ambulatory Visit: Payer: Medicare Other | Admitting: Family Medicine

## 2012-08-04 ENCOUNTER — Encounter: Payer: Self-pay | Admitting: Family Medicine

## 2012-08-04 ENCOUNTER — Ambulatory Visit (INDEPENDENT_AMBULATORY_CARE_PROVIDER_SITE_OTHER): Payer: Medicare Other | Admitting: Family Medicine

## 2012-08-04 VITALS — BP 145/89 | HR 98 | Ht 67.0 in | Wt 177.0 lb

## 2012-08-04 DIAGNOSIS — Z720 Tobacco use: Secondary | ICD-10-CM

## 2012-08-04 DIAGNOSIS — F17200 Nicotine dependence, unspecified, uncomplicated: Secondary | ICD-10-CM

## 2012-08-04 DIAGNOSIS — I10 Essential (primary) hypertension: Secondary | ICD-10-CM

## 2012-08-04 DIAGNOSIS — B0229 Other postherpetic nervous system involvement: Secondary | ICD-10-CM

## 2012-08-04 MED ORDER — LIDOCAINE 5 % EX OINT: TOPICAL_OINTMENT | Freq: Two times a day (BID) | CUTANEOUS | Status: DC

## 2012-08-04 NOTE — Patient Instructions (Addendum)
Dear Mr. Lassley,   Thank you for coming to clinic today. Please read below regarding the issues that we discussed.   1. Shingles Pain - Please take the gabapentin three times a day. Also, start using the lidocaine jelly. If this does not help, then call me at [phone] and leave me a message. I will try another cream.   2. Smoking - Consider calling 1800 Quit.   3. Colonoscopy - This is really important for screening for colon cancer. I will give you a number to call.   Please follow up in clinic in 2 weeks. Please call earlier if you have any questions or concerns.   Sincerely,   Dr. Clinton Sawyer

## 2012-08-04 NOTE — Progress Notes (Signed)
  Subjective:    Patient ID: Bryan Davies, male    DOB: [birth year], 61 y.o.   MRN: [id number]  HPI  61 year old M with post herpetic neuralgia, tobacco abuse, bipolar disorder, and hypertension who presents for evaluation of right sided pain.   Right Side Abdominal Pain - Shingles with post herpetic neuralgia diagnosed January 2013. Today complains of severe pain in the same distribution along T8 dermatome. Currently taking Gabapentin 300 mg BID, but he is prescribed it TID. He has never taken it TID as prescribed. He was prescribed hydrocodone-acetaminophen, 5 pills, in the ED on 07/15/12. He completed this course and said it helps more than the gabapentin. He has never used capsaicin cream or lidocaine cream.   Tobacco Abuse - After the last visit the patient was encouraged to call the quit line, which he did not. He did say that he cut back to 0.5 packs per day.   Review of Systems Negative for central chest pain, SOB, fever, chills, myalgias    Objective:   Physical Exam  BP 145/89  Pulse 98  Ht 5\' 7"  (1.702 m)  Wt 177 lb (80.287 kg)  BMI 27.72 kg/m2 Gen: middle age WM, non ill appearing, smells strongly like cigarette smoke Abd: healed vesicles in T8 distribution on right that are very tender to palpation      Assessment & Plan:  61 year old M with poorly controlled post-herpetic neuralgia.

## 2012-08-05 NOTE — Assessment & Plan Note (Signed)
BP Readings from Last 3 Encounters:  08/04/12 145/89  07/15/12 149/79  07/14/12 135/82   Poorly controlled at last 2 visits. Consider increasing chlorthalidone at next visit.

## 2012-08-05 NOTE — Assessment & Plan Note (Addendum)
Patient never called the quit line. He was once again encouraged to call.

## 2012-08-05 NOTE — Assessment & Plan Note (Signed)
I instructed the patient to take Gabapentin 300 mg three times a day instead of two. Additionally, he will use lidocaine jelly PRN. If this does not work, then I will try capsaicin cream.

## 2012-09-02 ENCOUNTER — Other Ambulatory Visit: Payer: Self-pay | Admitting: Family Medicine

## 2012-09-02 DIAGNOSIS — B0229 Other postherpetic nervous system involvement: Secondary | ICD-10-CM

## 2012-09-02 MED ORDER — GABAPENTIN 300 MG PO CAPS: 300.0000 mg | ORAL_CAPSULE | Freq: Three times a day (TID) | ORAL | Status: DC

## 2012-09-02 NOTE — Telephone Encounter (Signed)
Patient is calling for a refill on Gabapentin to go to [facility] on [place].

## 2012-09-02 NOTE — Telephone Encounter (Signed)
Forward to PCP for refill request, patient has appointment on Thursday.

## 2012-09-02 NOTE — Telephone Encounter (Signed)
Rx sent to [facility]

## 2012-09-04 ENCOUNTER — Ambulatory Visit (INDEPENDENT_AMBULATORY_CARE_PROVIDER_SITE_OTHER): Payer: Medicare Other | Admitting: Family Medicine

## 2012-09-04 ENCOUNTER — Encounter: Payer: Self-pay | Admitting: Family Medicine

## 2012-09-04 VITALS — BP 125/85 | HR 96 | Ht 67.75 in | Wt 187.0 lb

## 2012-09-04 DIAGNOSIS — N529 Male erectile dysfunction, unspecified: Secondary | ICD-10-CM

## 2012-09-04 DIAGNOSIS — B0229 Other postherpetic nervous system involvement: Secondary | ICD-10-CM

## 2012-09-04 DIAGNOSIS — Z720 Tobacco use: Secondary | ICD-10-CM

## 2012-09-04 DIAGNOSIS — F17200 Nicotine dependence, unspecified, uncomplicated: Secondary | ICD-10-CM

## 2012-09-04 MED ORDER — SILDENAFIL CITRATE 50 MG PO TABS: 50.0000 mg | ORAL_TABLET | Freq: Every day | ORAL | Status: DC | PRN

## 2012-09-04 NOTE — Progress Notes (Signed)
  Subjective:    Patient ID: Bryan Davies, male    DOB: Oct 28, 1951, 61 y.o.   MRN: [id number]  HPI  61 year old M w/ post-herpetic neuralgia, HTN, and tobacco abuse.  He is presenting for evaluation of post herpetic neuralgia. He is currently taking gabapentin 300 mg TID. He denies any side effects of the medication. He tried lidocaine jelly which was ineffective. He is no longer using it. He notes significant pain, like a knife stabbing him. He would like a prescription for a shingles vaccine.   Erectile Dysfunction - He is concerned about erectile dysfunction He cannot have or maintain an erection. It has been persistent for 10 years. He believes that it is caused by valproic acid. He has never tried any medications for this before.   Smoking Cessation - He has a 60 pack year history and would like to start using the patch. He does not think that he can do it without help.    Review of Systems Negative for chest pain, SOB, vomiting, nausea, and diarrhea    Objective:   Physical Exam BP 125/85  Pulse 96  Ht 5' 7.75" (1.721 m)  Wt 187 lb (84.823 kg)  BMI 28.64 kg/m2 Gen: middle aged WM, NAD, conversant HEENT: NCAT, PERRLA, OP clear and moist Pulm: CTA-B Abd: right sided scars along T6 dermatome, very TTP      Assessment & Plan:  61 year old w/ persistent post herpetic neuralgia, tobacco abuse, and erectile dysfunction.

## 2012-09-04 NOTE — Patient Instructions (Addendum)
Dear Bryan Davies,   Thank you for coming to clinic today. Please read below regarding the issues that we discussed.   1. Smoking - Call [employer]uit Now or [phone] or visit the website [email or web address]. Sometimes they give free patches. Otherwise, you can buy them yourself over the counter.   2. Viagra - There is no generic. I will give you a prescription for 5 pills if you would like.   3. Shingles - Increase the medication to 900 mg three times day.   Please follow up in clinic in 3 months. Please call earlier if you have any questions or concerns.   Sincerely,   Dr. Clinton Sawyer

## 2012-09-08 DIAGNOSIS — N529 Male erectile dysfunction, unspecified: Secondary | ICD-10-CM | POA: Insufficient documentation

## 2012-09-08 NOTE — Assessment & Plan Note (Signed)
Given information for [employer]uit [employer]. Patient will call to assess resources.

## 2012-09-08 NOTE — Assessment & Plan Note (Signed)
Likely relate to health status, age and possiby Depakote. No easily modifiable causes however. Patient given Rx for Viagra.

## 2012-09-08 NOTE — Assessment & Plan Note (Addendum)
Increase gabapentin to 900 mg TID. Rx for shingle vaccine given to patient and explained that vaccine will not alleviate the pain.

## 2012-09-18 ENCOUNTER — Encounter: Payer: Self-pay | Admitting: Family Medicine

## 2012-09-18 ENCOUNTER — Ambulatory Visit (INDEPENDENT_AMBULATORY_CARE_PROVIDER_SITE_OTHER): Payer: Medicare Other | Admitting: Family Medicine

## 2012-09-18 VITALS — BP 130/82 | HR 101 | Temp 97.9°F | Ht 67.75 in | Wt 186.0 lb

## 2012-09-18 DIAGNOSIS — Z720 Tobacco use: Secondary | ICD-10-CM

## 2012-09-18 DIAGNOSIS — F17200 Nicotine dependence, unspecified, uncomplicated: Secondary | ICD-10-CM

## 2012-09-18 DIAGNOSIS — M25511 Pain in right shoulder: Secondary | ICD-10-CM

## 2012-09-18 DIAGNOSIS — B0229 Other postherpetic nervous system involvement: Secondary | ICD-10-CM

## 2012-09-18 DIAGNOSIS — M25519 Pain in unspecified shoulder: Secondary | ICD-10-CM

## 2012-09-18 MED ORDER — ACETAMINOPHEN-CODEINE 300-30 MG PO TABS: 1.0000 | ORAL_TABLET | ORAL | Status: DC | PRN

## 2012-09-18 MED ORDER — PREDNISONE 50 MG PO TABS: 50.0000 mg | ORAL_TABLET | Freq: Every day | ORAL | Status: DC

## 2012-09-18 MED ORDER — LIDOCAINE 5 % EX PTCH: 1.0000 | MEDICATED_PATCH | CUTANEOUS | Status: DC

## 2012-09-18 NOTE — Assessment & Plan Note (Signed)
Seems interested in quitting but having difficult time getting patches. I have encouraged him to talk to his PCP, but also given information on 1-800-QUITNOW to call to ask about free patches. Patient appreciative.

## 2012-09-18 NOTE — Assessment & Plan Note (Signed)
Continue gabapentin. Will also treat with Lidoderm patch locally. Advised to not use any other creams or gels while using the patch. On review of information on uptodate, it seems codeine is effective in treatment as well. Will give Tylenol #3 for short trial. Patient advised to follow up with Dr. Clinton Sawyer in 2 weeks. Agrees with plan.

## 2012-09-18 NOTE — Assessment & Plan Note (Signed)
Patient with acute shoulder pain, has not tried any oral medication for the pain. ROM not severely limited. Given Tylenol #3 both for neuralgia as well as for shoulder pain. Unable to take NSAIDs because of renal insufficiency, therefore will also try short burst of prednisone to help with inflammation. Red flag symptoms discussed with patient. Will RTC in 2 weeks for follow up, and possible injection, if needed.

## 2012-09-18 NOTE — Patient Instructions (Signed)
It was nice to meet you today.  I have given you 3 new medications to use for a short period of time  1. Lidoderm patch to help with local pain on your side 2. Tylenol with codeine to help with both pains 3. Prednisone to help reduce the inflammation in your shoulder   Please follow up with Dr. Clinton Sawyer in 2 weeks for follow up.  Take care! Illene Sweeting M. Bethann Qualley, M.D.

## 2012-09-18 NOTE — Progress Notes (Signed)
Patient ID: Bryan Davies, male   DOB: 03/08/1951, 61 y.o.   MRN: [id number] [facility] Bryan Davies [name], MD Phone: [phone]   Subjective: HPI: Patient is a 61 y.o. male presenting to clinic today for same day appointment. Concerns today include shoulder pain and side pain.  1. Right side pain- Had shingles 11 months ago and reports has had pain ever since. Has had Zostavax and also been on Gabapentin which does not help. Reports pain is mid-axillary line and feels like someone is punching him in the side. He states it has not changed at all.   2. Right shoulder pain- Been going on for 2 weeks. Limited ROM. Feels better with hot water (showers), worse with movement. Denies any injury. Denies redness, swelling or rash. Denies fevers. Never had shoulder pain in the past. Has not tried any medications for the pain.  3. Tobacco use- Interested in quitting, requesting patch.  History Reviewed: Every day smoker. (10 cigs/day) Health Maintenance: UTD with flu shot  ROS: Please see HPI above.  Objective: Office vital signs reviewed.  Physical Examination:  General: Awake, alert. NAD. Speaks loudly. HEENT: Atraumatic, normocephalic Pulm: CTAB, no wheezes Cardio: RRR, no murmurs appreciated Abdomen: soft, nontender, nondistended MSK: No edema. Shoulder atraumatic, no swelling or erythema. Limited ROM when reaching up, but not back. No pain with supination or pronation. No TTP of joint.   Left flank without any obvious lesions. TTP over ribs mid-axillary. No edema or erythema Neuro: Grossly intact  Assessment: 61 yo M with shoulder pain and postherpetic neuralgia   Plan: See Problem List and After Visit Summary

## 2012-10-08 ENCOUNTER — Ambulatory Visit (INDEPENDENT_AMBULATORY_CARE_PROVIDER_SITE_OTHER): Payer: Medicare Other | Admitting: Family Medicine

## 2012-10-08 ENCOUNTER — Encounter: Payer: Self-pay | Admitting: Family Medicine

## 2012-10-08 VITALS — BP 125/81 | HR 92 | Temp 97.6°F | Ht 67.75 in | Wt 184.0 lb

## 2012-10-08 DIAGNOSIS — M25519 Pain in unspecified shoulder: Secondary | ICD-10-CM

## 2012-10-08 DIAGNOSIS — N179 Acute kidney failure, unspecified: Secondary | ICD-10-CM

## 2012-10-08 DIAGNOSIS — B0229 Other postherpetic nervous system involvement: Secondary | ICD-10-CM

## 2012-10-08 DIAGNOSIS — I10 Essential (primary) hypertension: Secondary | ICD-10-CM

## 2012-10-08 DIAGNOSIS — M25512 Pain in left shoulder: Secondary | ICD-10-CM | POA: Insufficient documentation

## 2012-10-08 LAB — BASIC METABOLIC PANEL
CO2: 28 mEq/L (ref 19–32)
Calcium: 9.1 mg/dL (ref 8.4–10.5)
Creat: 1.1 mg/dL (ref 0.50–1.35)
Sodium: 125 mEq/L — ABNORMAL LOW (ref 135–145)

## 2012-10-08 MED ORDER — CHLORTHALIDONE 25 MG PO TABS: 25.0000 mg | ORAL_TABLET | Freq: Every day | ORAL | Status: DC

## 2012-10-08 MED ORDER — NORTRIPTYLINE HCL 50 MG PO CAPS: 50.0000 mg | ORAL_CAPSULE | Freq: Every day | ORAL | Status: DC

## 2012-10-08 NOTE — Progress Notes (Signed)
  Subjective:    Patient ID: Bryan Davies, male    DOB: 1951-06-01, 61 y.o.   MRN: [id number]  HPI  61 year old M with post-herpetic neuralgia and shoulder pain who presents for follow up.   1. Post-Herpetic Neuralgia - Started in  The patient is taking Gabapentin 900 mg TID. He was also given Tylenol #3 and lidoderm patches after his last visit. Since his last visit he notes that the pain on his anterior chest wall has improved and is currently a 3/10. He is taking tylenol # 3 every 4 hours. He is using a new lidoderm patch each day. However, he now has pain under the right axilla. It is a 7/10. The pain is soreness. It started several day ago and is getting more painful. He denies any falls or injury. He denies any swelling.  2. Right Shoulder Pain - Was give Rx for prednisone at last visit. He currently notes that his shoulder pain has moved from the right to the left. He describes it as a pressure. It does not limit his ROM or normal daily activity.   Review of Systems Positive for tobacco use Negative for shortness of breath    Objective:   Physical Exam BP 125/81  Pulse 92  Temp 97.6 F (36.4 C) (Oral)  Ht 5' 7.75" (1.721 m)  Wt 184 lb (83.462 kg)  BMI 28.18 kg/m2 Gen: middle age, well appearing, slow and loud speech, smells very strongly of cigarettes Shoulder Exam - left Inspection: normal  Palpation:   Clavicle: no fracture, non tender  AC Joint: non tender  Scapula: normal             Biceps Tendon: tender to palpation ROM/Strength:  Abduction (Suprapsinatus): normal  Internal Rotation (Subsapularis):normal  External Rotation/Liftoff (I: normal Maneuvers:   Neer's: normal  Hawkin's:normal  Empty Can: normal  Drop Arm: not performed   Skin: Allodynia of right T5-6 dermatome, no new vesicles         Assessment & Plan:  61 year old male with uncontrolled post-herpetic neuralgia and possible inflammation of left biceps tendon.   Post-herpetic Neuralgia -  Start notritpyline 50 mg QHS; Cont Gabapenting; Cont PRN Lidoderm patches and Tylenol # 3; re-eval in 1 month Inflammation of Biceps Tendon - Ice area HTN - Well controlled; Cont chlorthalidone and check BMET today

## 2012-10-08 NOTE — Assessment & Plan Note (Signed)
Inflammation of Biceps Tendon - Ice and avoid heavy lifting

## 2012-10-08 NOTE — Assessment & Plan Note (Signed)
HTN - Well controlled; Cont chlorthalidone and check BMET today

## 2012-10-08 NOTE — Patient Instructions (Signed)
Dear Bryan Davies,   Thank you for coming to clinic today. Please read below regarding the issues that we discussed.   1. High Blood Pressure - I refilled your blood pressure medications. I am also going to check labs today. I will send you a letter with the results.   2. Right Sided Pain - This is all due to the the shingles pain. We need to start a new medication called nortriptyline. Start taking 50 mg each night. I sent the medication to the pharmacy. If it causes any changes in your mood, then stop taking it immediately. Continue with the gabapentin. Also, use the tyelnol # 3 and patches as needed.   3. Left Shoulder - Use ice, because this is inflammation of your biceps tendon.   Please follow up in clinic in 1 month. Please call earlier if you have any questions or concerns.   Sincerely,   Dr. Clinton Sawyer

## 2012-10-08 NOTE — Assessment & Plan Note (Signed)
Start notritpyline 50 mg QHS; Cont Gabapenting; Cont PRN Lidoderm patches and Tylenol # 3; re-eval in 1 month

## 2012-10-09 ENCOUNTER — Telehealth: Payer: Self-pay | Admitting: Family Medicine

## 2012-10-09 ENCOUNTER — Encounter: Payer: Self-pay | Admitting: Family Medicine

## 2012-10-09 DIAGNOSIS — E871 Hypo-osmolality and hyponatremia: Secondary | ICD-10-CM

## 2012-10-09 NOTE — Telephone Encounter (Signed)
Patient called to inform that sodium is low to 125. He is asymptomatic. This is secondary to his thiazide diuretic. Fortunately, the thiazide diuretic is working well. So we decided that he will drink an8 ounce original V8 each day for 1 month to increase his Na+. Then, he will return for a lab drawn and appt. He is in agreement with this plan.

## 2012-11-13 ENCOUNTER — Ambulatory Visit (INDEPENDENT_AMBULATORY_CARE_PROVIDER_SITE_OTHER): Payer: Medicare Other | Admitting: Family Medicine

## 2012-11-13 ENCOUNTER — Encounter: Payer: Self-pay | Admitting: Family Medicine

## 2012-11-13 VITALS — BP 152/90 | HR 85 | Ht 67.75 in | Wt 179.0 lb

## 2012-11-13 DIAGNOSIS — E871 Hypo-osmolality and hyponatremia: Secondary | ICD-10-CM

## 2012-11-13 DIAGNOSIS — I10 Essential (primary) hypertension: Secondary | ICD-10-CM

## 2012-11-13 DIAGNOSIS — B0229 Other postherpetic nervous system involvement: Secondary | ICD-10-CM

## 2012-11-13 MED ORDER — GABAPENTIN 300 MG PO CAPS: 300.0000 mg | ORAL_CAPSULE | Freq: Three times a day (TID) | ORAL | Status: DC

## 2012-11-13 MED ORDER — ACETAMINOPHEN-CODEINE 300-30 MG PO TABS: 1.0000 | ORAL_TABLET | ORAL | Status: DC | PRN

## 2012-11-13 MED ORDER — AMLODIPINE BESYLATE 5 MG PO TABS: 5.0000 mg | ORAL_TABLET | Freq: Every day | ORAL | Status: DC

## 2012-11-13 NOTE — Patient Instructions (Signed)
Dear Mr. Bryan Davies,   I have sent refills to the pharmacy for gabapentin and tylenol #3. Please take the nortriptyline every other night, and when you run out, I'll give you a prescription for 25 mg pills so you won't sleep so much.   When you can afford it, please start taking amlodipine 5 mg for blood pressure.   Thank you very much,   Dr. Clinton Sawyer

## 2012-11-13 NOTE — Progress Notes (Signed)
  Subjective:    Patient ID: Bryan Davies, male    DOB: [birth year], 62 y.o.   MRN: [id number]  HPI  62 year old M with HTN , tobacco abuse and post-herpetic neuralgia who presents for routine follow up.   1. Hyponatremia - Na+ 125 at last check. Likely secondary to thiazide that he is taking for HTN. Denies lightheadedness and weakness.   2. Hypertension  Home BP monitoring:  BP Readings from Last 3 Encounters:  11/13/12 152/90  10/08/12 125/81  09/18/12 130/82    Prescribed meds: chlorthalidone 25 mg daily   Hypertension ROS: taking medications as instructed, no medication side effects noted, no TIA's, no chest pain on exertion, no dyspnea on exertion and no swelling of ankles  3. Post-herpectic neuralgia: Has been present for 1 year; last on notriptyline, gabapentin, lidoderm and tylenol # 3; over the last month, he reports taking all 4 and that they are helping with the pain but causing him to sleep excessively; if he takes all medications then the pain is a 4/10, as opposed to a 7/10 right now    Review of Systems Positive for tobacco use     Objective:   Physical Exam BP 152/90  Pulse 85  Ht 5' 7.75" (1.721 m)  Wt 179 lb (81.194 kg)  BMI 27.42 kg/m2  Gen: appears older than stated age, smell strongly of cigarette smoke  CV: RRR, no murmurs, rubs, gallops Pulm: Lungs CTA-B Abd: allodynia along T6 dermatome     Assessment & Plan:  62 year old M with post herpetic neuralgia that is improvingand needs a change of HTN meds.

## 2012-11-17 DIAGNOSIS — E871 Hypo-osmolality and hyponatremia: Secondary | ICD-10-CM | POA: Insufficient documentation

## 2012-11-17 NOTE — Assessment & Plan Note (Signed)
Likely 2/2 to thiazide. Stop chlorthalidone and recheck in 1 month.

## 2012-11-17 NOTE — Assessment & Plan Note (Signed)
Cont Nortriptyline but decrease to 25 mg QHS. Cont Gabapentin and Tylenol # 3 PRN. Patient has lidoderm patches as well.

## 2012-11-17 NOTE — Assessment & Plan Note (Signed)
Stop chlorthalidone and start amlodipine 5 mg.

## 2013-01-02 ENCOUNTER — Ambulatory Visit: Payer: Medicare Other | Admitting: Family Medicine

## 2013-01-14 ENCOUNTER — Ambulatory Visit (HOSPITAL_COMMUNITY)
Admission: RE | Admit: 2013-01-14 | Discharge: 2013-01-14 | Disposition: A | Discharge status: Home / Self Care | Payer: Medicare Other | Source: Ambulatory Visit | Attending: Family Medicine | Admitting: Family Medicine

## 2013-01-14 ENCOUNTER — Ambulatory Visit (INDEPENDENT_AMBULATORY_CARE_PROVIDER_SITE_OTHER): Payer: Medicare Other | Admitting: Family Medicine

## 2013-01-14 ENCOUNTER — Encounter: Payer: Self-pay | Admitting: Family Medicine

## 2013-01-14 VITALS — BP 127/82 | HR 89 | Ht 67.75 in | Wt 177.0 lb

## 2013-01-14 DIAGNOSIS — M25519 Pain in unspecified shoulder: Secondary | ICD-10-CM

## 2013-01-14 DIAGNOSIS — M25512 Pain in left shoulder: Secondary | ICD-10-CM

## 2013-01-14 DIAGNOSIS — E871 Hypo-osmolality and hyponatremia: Secondary | ICD-10-CM

## 2013-01-14 DIAGNOSIS — B0229 Other postherpetic nervous system involvement: Secondary | ICD-10-CM

## 2013-01-14 DIAGNOSIS — R0789 Other chest pain: Secondary | ICD-10-CM

## 2013-01-14 DIAGNOSIS — I10 Essential (primary) hypertension: Secondary | ICD-10-CM

## 2013-01-14 DIAGNOSIS — R071 Chest pain on breathing: Secondary | ICD-10-CM

## 2013-01-14 DIAGNOSIS — F319 Bipolar disorder, unspecified: Secondary | ICD-10-CM

## 2013-01-14 DIAGNOSIS — Z136 Encounter for screening for cardiovascular disorders: Secondary | ICD-10-CM | POA: Insufficient documentation

## 2013-01-14 LAB — BASIC METABOLIC PANEL
Chloride: 101 mEq/L (ref 96–112)
Potassium: 4 mEq/L (ref 3.5–5.3)
Sodium: 140 mEq/L (ref 135–145)

## 2013-01-14 NOTE — Progress Notes (Signed)
  Subjective:    Patient ID: Bryan Davies, male    DOB: 1951-10-08, 62 y.o.   MRN: [id number]  HPI  62 year old M with HTN, bipolar disorder, and post-herpetic neuralgia who presents for follow up.   1. Hypertension  Home BP monitoring:  BP Readings from Last 3 Encounters:  01/14/13 127/82  11/13/12 152/90  10/08/12 125/81    Prescribed meds: Amlodipine 5 mg  Hypertension ROS: taking medications as instructed, no medication side effects noted, no TIA's, no chest pain on exertion, no dyspnea on exertion and no swelling of ankles  2. Left Shoulder Pain - Hurts in anterior left shoulder, limited raising of shoulder, cannot remember when it started , pain comes and goes; denies any heavy lifting or trauma, no surgery on that arm  3. Right Sided Chest Wall Pain - Right breast pain, above area where shingles pain is, hurts when palpated, no worsened by activity, no radiating, no associated with nausea, vomiting or diaphoresis, pain alleviated by pain meds - lidoderm, amitriptyline, tylenol # 3, and gabapentin - which are taken for post-herpetic neuropathy, no hx of CAD   Review of Systems Excessive sleepiness; Otherwise see HPI      Objective:   Physical Exam BP 127/82  Pulse 89  Ht 5' 7.75" (1.721 m)  Wt 177 lb (80.287 kg)  BMI 27.11 kg/m2 Gen: elderly WF, non ill appearing, pleasant and conversant CV: RRR, no murmurs Chest Wall: no visible abnormalities, no masses in breast, marked tenderness to palpation along T4 dermatome where healed herpetic lesions distributed, marked TTP around left nipple without skin changes   Shoulder Exam - left Inspection: WNL Palpation:   Clavicle:  non tender  AC Joint: non tender  Scapula: non tender             Biceps Tendon: mild tenderness ROM/Strength:  Abduction (Suprapsinatus): normal  Internal Rotation/Liftoff (Subsapularis): limited due to pain, strength intact  External Rotation (Infraspinatus/Teres Minor): limited due to pain,  strength intact Maneuvers:   Neer's: limited  Hawkin's:limited  Empty Can: normal  Drop Arm: limited   Psych: abnormal affect, juvenile like speech and response to questioning, no tangential thoughts, no psychomotor disturbances    Date: 01/14/13  Rate: 90  Rhythm: normal sinus rhythm  QRS Axis: normal  Intervals: normal  ST/T Wave abnormalities: normal  Conduction Disutrbances:none  Narrative Interpretation: no evidence of ischemia or injury or infarct  Old EKG Reviewed: none available        Assessment & Plan:  62 year old M with bipolar disease, tobacco abuse, post-herpetic neuralgia, and new anterior wall chest pain and left shoulder pain that are show no red flags for serious cardiac or pulm issues.

## 2013-01-14 NOTE — Patient Instructions (Signed)
Dear Bryan Davies,   I think that you are experiencing shoulder impingement syndrome. Please ice your shoulder and return for a steroid injection. I will send a copy of the valproic acid level to [facility].   Follow up as soon as you can for shoulder evaluation.   Take Care,   Dr. Clinton Sawyer

## 2013-01-15 LAB — VALPROIC ACID LEVEL: Valproic Acid Lvl: 22.1 ug/mL — ABNORMAL LOW (ref 50.0–100.0)

## 2013-01-15 NOTE — Assessment & Plan Note (Signed)
Impingement syndrome - return for steroid injection.

## 2013-01-15 NOTE — Assessment & Plan Note (Signed)
Obtained valproic acid level and faxed to [facility].

## 2013-01-15 NOTE — Assessment & Plan Note (Signed)
This is likely cause for anterior chest wall pain. No changes to med mgt.

## 2013-01-15 NOTE — Assessment & Plan Note (Signed)
Well controlled, continue norvasc 5 mg.

## 2013-01-15 NOTE — Assessment & Plan Note (Signed)
Check BMET. Likely improved since not taking HCTZ anymore.

## 2013-01-30 ENCOUNTER — Ambulatory Visit: Payer: Medicare Other

## 2013-02-16 ENCOUNTER — Encounter: Payer: Self-pay | Admitting: *Deleted

## 2013-02-16 ENCOUNTER — Ambulatory Visit: Payer: Medicare Other | Admitting: Family Medicine

## 2013-03-12 ENCOUNTER — Ambulatory Visit (INDEPENDENT_AMBULATORY_CARE_PROVIDER_SITE_OTHER): Payer: Medicare Other | Admitting: Family Medicine

## 2013-03-12 ENCOUNTER — Encounter: Payer: Self-pay | Admitting: Family Medicine

## 2013-03-12 VITALS — BP 141/79 | HR 91 | Temp 98.8°F | Wt 173.0 lb

## 2013-03-12 DIAGNOSIS — Z789 Other specified health status: Secondary | ICD-10-CM

## 2013-03-12 DIAGNOSIS — Z9189 Other specified personal risk factors, not elsewhere classified: Secondary | ICD-10-CM

## 2013-03-12 DIAGNOSIS — M25519 Pain in unspecified shoulder: Secondary | ICD-10-CM

## 2013-03-12 DIAGNOSIS — M25512 Pain in left shoulder: Secondary | ICD-10-CM

## 2013-03-12 DIAGNOSIS — L089 Local infection of the skin and subcutaneous tissue, unspecified: Secondary | ICD-10-CM

## 2013-03-12 MED ORDER — DOXYCYCLINE HYCLATE 100 MG PO TABS: 100.0000 mg | ORAL_TABLET | Freq: Two times a day (BID) | ORAL | Status: DC

## 2013-03-12 NOTE — Patient Instructions (Addendum)
Dear Mr. Loomer,   Thank you for coming today. Please read below.   1. Left Shoulder Pain - I think this is likely a problem with your rotator cuff, but it is minor. Please follow up in 4 weeks to see how the injection worked. If there is any swelling, redness or fever, come back sooner.   2. Tick Bites - Please take the antibiotic called Doxycycline twice a day for 7 days.   See you in 4 weeks.   Sincerely,   Dr. Clinton Sawyer

## 2013-03-12 NOTE — Progress Notes (Signed)
  Subjective:    Patient ID: Bryan Davies, male    DOB: 1951/04/10, 62 y.o.   MRN: [id number]  HPI  1. Left Shoulder Pain -  Location: anterior Duration: several months Onset: gradual Exacerbating factors: reaching for something overhead, sleeping on the left side, walking his dog Alleviating factors: rest Hx of injury: no Hx of imaging: no Hx of intervention: no   2. Tick bite - 2 bites on left hip, occurred about 2 weeks ago, ticks were removed before they became engorged but left hip with swelling and tenderness that is persistent; denies fever, chills, rash, arthralgias   Review of Systems     Objective:   Physical Exam BP 141/79  Pulse 91  Temp(Src) 98.8 F (37.1 C) (Oral)  Wt 173 lb (78.472 kg)  BMI 26.49 kg/m2  Shoulder Exam - left Inspection: normal  Palpation:   Clavicle: WNL  AC Joint: WNL  Scapula: WNL             Biceps Tendon: WNL ROM/Strength:  Abduction (Suprapsinatus): normal  Internal Rotation/Liftoff (Subsapularis):limited  External Rotation (Infraspinatus/Teres Minor): limited Maneuvers:   Neer's: normal  Hawkin's:limited  Empty Can: normal  Drop Arm: normal    Left Hip - marked induration, erythema and tenderness 5 cm x 4 cm, not warm, no fluctuance      Assessment & Plan:   Aspiration/Injection Procedure Note Bryan Davies [id number] [birth year]  Procedure: Injection left subacromial space   Indications: Rotator cuff impingement   Procedure Details Consent: Risks of procedure as well as the alternatives and risks of each were explained to the (patient/caregiver).  Consent for procedure obtained. Time Out: Verified patient identification, verified procedure, site/side was marked, verified correct patient position, special equipment/implants available, medications/allergies/relevent history reviewed, required imaging and test results available.  Performed   Skin prepped with alcohol.  Local Anesthesia Used:Lidocaine 2% plain; 2  mL Steroid Used: methylprednisolone 40 mg  Approach: posterior A sterile dressing was applied.  Patient did tolerate procedure well. Estimated blood loss: 0  Montgomery Favor 03/21/2013, 10:19 AM

## 2013-03-21 DIAGNOSIS — L089 Local infection of the skin and subcutaneous tissue, unspecified: Secondary | ICD-10-CM | POA: Insufficient documentation

## 2013-03-21 NOTE — Assessment & Plan Note (Signed)
Concern for rotator cuff tendinopathy vs impingement, bu exam equivocal. No weakness or neurologic signs requiring imaging. Patient offered steroid injection and accepted. See Procedure note.

## 2013-03-21 NOTE — Assessment & Plan Note (Addendum)
Concern for marked cutaneous reaction to tick bites actually representing cutaneous infection. Given severity of infection and tick bite, will use doxycycline 100 mg BID x 7 days for coverage of MRSA infections as well as rickettsial disease, although unlikely.

## 2013-04-10 ENCOUNTER — Ambulatory Visit: Payer: Medicare Other | Admitting: Family Medicine

## 2013-04-17 ENCOUNTER — Encounter: Payer: Self-pay | Admitting: Family Medicine

## 2013-04-17 ENCOUNTER — Ambulatory Visit (INDEPENDENT_AMBULATORY_CARE_PROVIDER_SITE_OTHER): Payer: Medicare Other | Admitting: Family Medicine

## 2013-04-17 VITALS — BP 124/82 | HR 90 | Ht 67.5 in | Wt 170.0 lb

## 2013-04-17 DIAGNOSIS — L089 Local infection of the skin and subcutaneous tissue, unspecified: Secondary | ICD-10-CM

## 2013-04-17 DIAGNOSIS — B0229 Other postherpetic nervous system involvement: Secondary | ICD-10-CM

## 2013-04-17 DIAGNOSIS — M25511 Pain in right shoulder: Secondary | ICD-10-CM

## 2013-04-17 DIAGNOSIS — M25519 Pain in unspecified shoulder: Secondary | ICD-10-CM

## 2013-04-17 MED ORDER — METHYLPREDNISOLONE ACETATE 40 MG/ML IJ SUSP
40.0000 mg | Freq: Once | INTRAMUSCULAR | Status: AC
  Administered 2013-04-17: 40 mg via INTRA_ARTICULAR

## 2013-04-17 MED ORDER — GABAPENTIN 300 MG PO CAPS: 600.0000 mg | ORAL_CAPSULE | Freq: Three times a day (TID) | ORAL | Status: DC

## 2013-04-17 NOTE — Progress Notes (Signed)
Subjective:    Patient ID: Bryan Davies, male    DOB: 1951/06/22, 62 y.o.   MRN: [id number]  HPI  1. Post Herpetic Neuralgia - Pain exacerbated by dog pulling that arm during walks > Taking nortriptyline 25 mg at night and gabapentin 300 mg TID and lidoderm patches and still concerned about the pain > Patient is still surprised by how much it hurts > Patient states that an "aid" said there is better medication that he can be put on   2. Follow Up Tick Bite - Multiple bites on left hip and large induration when seen on 03/12/13. Patient started on doxycycline. Swelling is markedly improved. Patient denies fever, chills, rash, and pain or swelling in multiple joints.   3. Right Shoulder Pain -  Location: anterior  Duration: 1 month  Onset: gradual  Exacerbating factors: reaching for something overhead, sleeping on the left side, walking his dog  Alleviating factors: rest  Hx of injury: no  Hx of imaging: no  Hx of intervention: no    Current Outpatient Prescriptions on File Prior to Visit  Medication Sig Dispense Refill  . Acetaminophen-Codeine (TYLENOL/CODEINE #3) 300-30 MG per tablet Take 1 tablet by mouth every 4 (four) hours as needed for pain.  30 tablet  2  . amLODipine (NORVASC) 5 MG tablet Take 1 tablet (5 mg total) by mouth daily.  30 tablet  2  . doxycycline (VIBRA-TABS) 100 MG tablet Take 1 tablet (100 mg total) by mouth 2 (two) times daily.  14 tablet  0  . gabapentin (NEURONTIN) 300 MG capsule Take 1 capsule (300 mg total) by mouth 3 (three) times daily.  90 capsule  5  . ibuprofen (ADVIL,MOTRIN) 200 MG tablet Take 600 mg by mouth every 6 (six) hours as needed. For headache.       . lidocaine (LIDODERM) 5 % Place 1 patch onto the skin daily. Remove & Discard patch within 12 hours or as directed by MD. Do not use with any other creams or gels.  30 patch  0  . nortriptyline (PAMELOR) 50 MG capsule Take 1 capsule (50 mg total) by mouth at bedtime.  30 capsule  2  . sildenafil  (VIAGRA) 50 MG tablet Take 1 tablet (50 mg total) by mouth daily as needed for erectile dysfunction.  5 tablet  0  . valproic acid (DEPAKENE) 250 MG capsule Take 750 mg by mouth 2 (two) times daily.        No current facility-administered medications on file prior to visit.     Review of Systems See HPI    Objective:   Physical Exam BP 124/82  Pulse 90  Ht 5' 7.5" (1.715 m)  Wt 170 lb (77.111 kg)  BMI 26.22 kg/m2  Shoulder Exam - right  Inspection: normal  Palpation:   Clavicle: WNL   AC Joint: WNL   Scapula: WNL   Biceps Tendon: WNL  ROM/Strength:   Abduction (Suprapsinatus): limited   Internal Rotation/Liftoff (Subsapularis):limited   External Rotation (Infraspinatus/Teres Minor): limited  Maneuvers:   Neer's: limited   Hawkin's:limited   Empty Can: normal    Chest Wall: right sided tenderness to palpation along T4 dermatome where healed herpetic lesions distributed, no changes from previous exams   Skin: left hip with erythema or induration     Assessment & Plan:   Procedure: Injection left subacromial space  Indications: shoulder pain concerning for rotator cuff tendonopathy   Procedure Details  Consent: Risks of procedure as well  as the alternatives and risks of each were explained to the (patient/caregiver). Consent for procedure obtained.  Time Out: Verified patient identification, verified procedure, site/side was marked, verified correct patient position, special equipment/implants available, medications/allergies/relevent history reviewed, required imaging and test results available. Performed  Skin prepped with alcohol.  Local Anesthesia Used:Lidocaine 2% plain; 2 mL  Steroid Used: methylprednisolone 40 mg  Approach: posterior  A sterile dressing was applied.  Patient did tolerate procedure well.  Estimated blood loss: 0

## 2013-04-17 NOTE — Patient Instructions (Signed)
Mr. Hetland,   Please increase the dose of gabapentin to 600 mg (2 pills), three times a day. So you would be taking 6 pills a day.   Please follow up in 6 weeks for your shoulder and chest pain.   Sincerely,   Dr. Clinton Sawyer

## 2013-04-19 DIAGNOSIS — M25511 Pain in right shoulder: Secondary | ICD-10-CM | POA: Insufficient documentation

## 2013-04-19 NOTE — Assessment & Plan Note (Signed)
Resolved

## 2013-04-19 NOTE — Assessment & Plan Note (Signed)
Based upon physical exam, patient with likely rotator cuff tendonopathy. Will try subacromial steroid injection for pain relief. F/u in 4-6 weeks. Consider imaging at that time if not improved. Also, may benefit from physical therapy.

## 2013-04-19 NOTE — Assessment & Plan Note (Signed)
That patient has consistent pain, which is expected with this condition unfortunately. However, I have told the patient at every visit that this pain will be chronic, but he does not seem to understand that. I once again said that I cannot cure this problem. Increasing the gabapentin to 600 mg TID is the best option for now. I am trying to avoid and SNRI give his bipolar disorder, which is managed by another physician.

## 2013-06-01 ENCOUNTER — Encounter: Payer: Self-pay | Admitting: Family Medicine

## 2013-06-01 ENCOUNTER — Ambulatory Visit (INDEPENDENT_AMBULATORY_CARE_PROVIDER_SITE_OTHER): Payer: Medicare Other | Admitting: Family Medicine

## 2013-06-01 VITALS — BP 131/86 | HR 88 | Ht 67.5 in | Wt 170.5 lb

## 2013-06-01 DIAGNOSIS — M25561 Pain in right knee: Secondary | ICD-10-CM | POA: Insufficient documentation

## 2013-06-01 DIAGNOSIS — M25569 Pain in unspecified knee: Secondary | ICD-10-CM

## 2013-06-01 DIAGNOSIS — M25562 Pain in left knee: Secondary | ICD-10-CM

## 2013-06-01 DIAGNOSIS — I10 Essential (primary) hypertension: Secondary | ICD-10-CM

## 2013-06-01 MED ORDER — MELOXICAM 15 MG PO TABS: 15.0000 mg | ORAL_TABLET | Freq: Every day | ORAL | Status: DC

## 2013-06-01 MED ORDER — AMLODIPINE BESYLATE 5 MG PO TABS: 5.0000 mg | ORAL_TABLET | Freq: Every day | ORAL | Status: DC

## 2013-06-01 NOTE — Patient Instructions (Addendum)
Dear Mr. Olden,   I think that your pain is from bursitis under the knee cap. This should improve if you ice your knee and use a medication call Meloxciam. Use it once a day for at least 2 weeks. Also, using a compression brace may help.   Please follow up sooner for this issue if there is increased redness or swelling or pain not controlled with the medication. Also, please come back sooner if you develop fever or chills.   Follow up in 1 month.   Sincerely,   Dr. Clinton Sawyer

## 2013-06-01 NOTE — Assessment & Plan Note (Signed)
No evidence of septic joint or gout. Likely suprapatellar bursitis. Try conservative measures with ice, NSAIDS and compression brace. F/u in 1 month.

## 2013-06-01 NOTE — Assessment & Plan Note (Signed)
Well controlled on amlodipine. Continue 5 mg daily. Encouraged to stop smoking.

## 2013-06-01 NOTE — Progress Notes (Signed)
  Subjective:    Patient ID: Bryan Davies, male    DOB: August 07, 1951, 62 y.o.   MRN: [id number]  HPI  62 year old M with post-herpetic neuralgia, hypertension, and tobacco abuse presenting for evaluation.   Hypertension  Home BP monitoring:   Office BP: BP Readings from Last 3 Encounters:  06/01/13 131/86  04/17/13 124/82  03/12/13 141/79    Prescribed meds: Amlodipine   Hypertension ROS:  Taking medications as prescribed:Yes Chest pain: No Shortness of breath: No Swelling of extremities: No TIA symptoms: No   Knee Pain Left knee, anterior Superior to patellar 1 month duration, worsening over that time, weakness after 0.5 mi walking No symptoms on left Denies swelling, fever, chills, redness  Review of Systems     Objective:   Physical Exam BP 131/86  Pulse 88  Ht 5' 7.5" (1.715 m)  Wt 170 lb 8 oz (77.338 kg)  BMI 26.29 kg/m2  CV: RRR, no murmurs Pulm: CTA-B Left knee: normal appearing, no redness or swelling, tenderness in suprapatellar region on palpation, 5/5 strength, normal extension and flexion of knee LE: no edema    Assessment & Plan:

## 2013-06-18 ENCOUNTER — Encounter (HOSPITAL_COMMUNITY): Payer: Self-pay | Admitting: *Deleted

## 2013-06-18 ENCOUNTER — Emergency Department (HOSPITAL_COMMUNITY)
Admission: EM | Admit: 2013-06-18 | Discharge: 2013-06-18 | Disposition: A | Discharge status: Home / Self Care | Payer: Medicare Other | Attending: Emergency Medicine | Admitting: Emergency Medicine

## 2013-06-18 DIAGNOSIS — M533 Sacrococcygeal disorders, not elsewhere classified: Secondary | ICD-10-CM

## 2013-06-18 DIAGNOSIS — F411 Generalized anxiety disorder: Secondary | ICD-10-CM | POA: Insufficient documentation

## 2013-06-18 DIAGNOSIS — Z8619 Personal history of other infectious and parasitic diseases: Secondary | ICD-10-CM | POA: Insufficient documentation

## 2013-06-18 DIAGNOSIS — F17200 Nicotine dependence, unspecified, uncomplicated: Secondary | ICD-10-CM | POA: Insufficient documentation

## 2013-06-18 DIAGNOSIS — Z79899 Other long term (current) drug therapy: Secondary | ICD-10-CM | POA: Insufficient documentation

## 2013-06-18 DIAGNOSIS — I10 Essential (primary) hypertension: Secondary | ICD-10-CM | POA: Insufficient documentation

## 2013-06-18 HISTORY — DX: Essential (primary) hypertension: I10

## 2013-06-18 MED ORDER — NAPROXEN 375 MG PO TABS: 375.0000 mg | ORAL_TABLET | Freq: Two times a day (BID) | ORAL | Status: DC

## 2013-06-18 MED ORDER — PREDNISONE 20 MG PO TABS: 40.0000 mg | ORAL_TABLET | Freq: Every day | ORAL | Status: DC

## 2013-06-18 NOTE — ED Provider Notes (Signed)
CSN: [id number]     Arrival date & time 06/18/13  1607 History    This chart was scribed for Bryan Sinning, PA, working with Gerhard Munch, MD by Blanchard Kelch, ED Scribe. This patient was seen in room WTR6/WTR6 and the patient's care was started at 5:49 PM.     Chief Complaint  Patient presents with  . Hip Pain    Patient is a 62 y.o. male presenting with hip pain. The history is provided by the patient. No language interpreter was used.  Hip Pain This is a new problem. The current episode started more than 1 week ago (wosened today). The problem has been gradually worsening. Pertinent negatives include no chest pain, no headaches and no shortness of breath. The symptoms are aggravated by bending. Nothing relieves the symptoms.    HPI Comments: CESARIO WEIDINGER is a 62 y.o. male who presents to the Emergency Department complaining of severe, constant, posterior right hip pain that began about two weeks ago, but worsened today when patient was bending over to put on shoes. Pain worse with ambulation and bending over.  Patient denies any previous similar pain to the hip. He has not taken anything for the pain.  Patient denies numbness, tingling or any recent injuries to the hip.  Denies fever or chills.    Past Medical History  Diagnosis Date  . Anxiety   . Shingles 2013  . Hypertension    History reviewed. No pertinent past surgical history. No family history on file. History  Substance Use Topics  . Smoking status: Current Every Day Smoker -- 1.00 packs/day for 50 years  . Smokeless tobacco: Not on file     Comment: 10  cigarettes per day, waits an hour between  . Alcohol Use: No    Review of Systems  Respiratory: Negative for shortness of breath.   Cardiovascular: Negative for chest pain.  Musculoskeletal: Positive for arthralgias (right posterior hip).  Neurological: Negative for weakness, numbness and headaches.  All other systems reviewed and are  negative.    Allergies  Review of patient's allergies indicates no known allergies.  Home Medications   Current Outpatient Rx  Name  Route  Sig  Dispense  Refill  . Acetaminophen-Codeine (TYLENOL/CODEINE #3) 300-30 MG per tablet   Oral   Take 1 tablet by mouth every 4 (four) hours as needed for pain.   30 tablet   2   . amLODipine (NORVASC) 5 MG tablet   Oral   Take 1 tablet (5 mg total) by mouth daily.   30 tablet   11   . gabapentin (NEURONTIN) 300 MG capsule   Oral   Take 2 capsules (600 mg total) by mouth 3 (three) times daily.   90 capsule   5   . lidocaine (LIDODERM) 5 %   Transdermal   Place 1 patch onto the skin daily. Remove & Discard patch within 12 hours or as directed by MD. Do not use with any other creams or gels.   30 patch   0   . meloxicam (MOBIC) 15 MG tablet   Oral   Take 1 tablet (15 mg total) by mouth daily.   30 tablet   0   . nortriptyline (PAMELOR) 50 MG capsule   Oral   Take 1 capsule (50 mg total) by mouth at bedtime.   30 capsule   2   . valproic acid (DEPAKENE) 250 MG capsule   Oral   Take 750 mg  by mouth 2 (two) times daily.          . sildenafil (VIAGRA) 50 MG tablet   Oral   Take 1 tablet (50 mg total) by mouth daily as needed for erectile dysfunction.   5 tablet   0    Triage Vitals: BP 162/73  Pulse 95  Temp(Src) 98.4 F (36.9 C) (Oral)  Resp 18  SpO2 95%  Physical Exam  Nursing note and vitals reviewed. Constitutional: He is oriented to person, place, and time. He appears well-developed and well-nourished.  HENT:  Head: Normocephalic and atraumatic.  Eyes: Conjunctivae and EOM are normal.  Cardiovascular: Normal rate, regular rhythm and normal heart sounds.   2+ DP pulse bilaterally  Pulmonary/Chest: Effort normal and breath sounds normal.  Musculoskeletal:  Tenderness upon palpitation of right SI joint. No erythema or edema present.  Neurological: He is alert and oriented to person, place, and time.   Sensation of both feet intact  Skin: Skin is warm and dry.  Psychiatric: He has a normal mood and affect.    ED Course  DIAGNOSTIC STUDIES:  Oxygen Saturation is 95% on room air, adequate by my interpretation.    COORDINATION OF CARE:  6:02 PM -Will order anti-inflammatory medication for SI joint inflammation. Discussed treatment with ice and rest to relieve pain. Patient verbalizes understanding and agrees with treatment plan.   Procedures (including critical care time)  Labs Reviewed - No data to display No results found. No diagnosis found.  MDM  Patient presenting with pain of the right SI joint.  No acute injury or trauma.  Patient neurovascularly intact.  Able to ambulate.  Patient instructed to take NSAIDS and given referral to Orthopedics.  Return precautions given.  I personally performed the services described in this documentation, which was scribed in my presence. The recorded information has been reviewed and is accurate.    Pascal Lux [place], PA-C 06/19/13 1100

## 2013-06-18 NOTE — ED Notes (Signed)
Pt reports he was bending over to put his shoes on this am and started to notice pain in his R hip.  Denies any injury.

## 2013-06-19 NOTE — ED Provider Notes (Signed)
  Medical screening examination/treatment/procedure(s) were performed by non-physician practitioner and as supervising physician I was immediately available for consultation/collaboration.    Gerhard Munch, MD 06/19/13 [phone]

## 2013-06-26 ENCOUNTER — Ambulatory Visit (INDEPENDENT_AMBULATORY_CARE_PROVIDER_SITE_OTHER): Payer: Medicare Other | Admitting: Family Medicine

## 2013-06-26 ENCOUNTER — Encounter: Payer: Self-pay | Admitting: Family Medicine

## 2013-06-26 VITALS — BP 137/85 | HR 98 | Ht 67.0 in | Wt 174.0 lb

## 2013-06-26 DIAGNOSIS — M25569 Pain in unspecified knee: Secondary | ICD-10-CM

## 2013-06-26 DIAGNOSIS — M533 Sacrococcygeal disorders, not elsewhere classified: Secondary | ICD-10-CM

## 2013-06-26 DIAGNOSIS — M25562 Pain in left knee: Secondary | ICD-10-CM

## 2013-06-26 MED ORDER — MELOXICAM 15 MG PO TABS: 15.0000 mg | ORAL_TABLET | Freq: Every day | ORAL | Status: DC

## 2013-06-26 NOTE — Progress Notes (Signed)
  Subjective:    Patient ID: Bryan Davies, male    DOB: [birth year], 62 y.o.   MRN: [id number]  HPI  62 year old M with bipolar disorder, tobacco abuse, HTN and post-herpectic neuralgia who presents for evaluation of musculoskeletal pain.   Knee Pain, Bilateraly - seen on 06/01/13 and thought that findings consistent with suprapatellar bursitis and recommended conservative measure of ice, NSAIDS, and compression brace - patient reports that pain improved on left, he's been wearing a compression brace and using Mobic 15 mg daily, he has not been icing or exercising - Now he is experiencing similar pain on the right side, with pain superior to the patellar, he notes it is a 9/10, it is worsened by getting out of his bathtub, he denies swelling, redness, known trauma, he thinks he needs a brace for this knee as well  Right Hip Pain - seen in ED for this issue on 06/18/13, had a normal exam, diagnosis of sacroilitis  given Rx for prednisone and Naproxyn and referred to the orthopedic physician  - Patient has not filled either prescription or made an appointment with the orthopedic surgeon - Pain persistent on right ride on posterior lower back - No falls injuries or previous surgeries - Pain exacerbated by palpation    Review of Systems Neg for fever, chills, weight loss, rash, joint swelling    Objective:   Physical Exam BP 137/85  Pulse 98  Ht 5\' 7"  (1.702 m)  Wt 174 lb (78.926 kg)  BMI 27.25 kg/m2 Gen: middle aged WM, non ill appearing, non distressed, smells strongly of cigarette smoke Knee Exam:  Laterality: right Appearance: normal Edema: no   Tenderness: yes  Superior to patella along quad tendon Range of Motion: > Passive Extension: normal Flexion:normal > Active Extension: normal Flexion: normal Laxity: none Maneuvers: > Lachman's: neg > Grind: neg Strength:  > Quadricep: 5 > Hamstring: 5 Gait: normal    Laterality: left Appearance: normal Edema: no   Tenderness:  yes  Superior to patella along quad tendon Range of Motion: > Passive Extension: normal Flexion:normal > Active Extension: normal Flexion: normal Laxity: none Maneuvers: > Lachman's: neg > Grind: neg Strength:  > Quadricep: 5 > Hamstring: 5 Gait: normal  Back: normal appearance, tenderness over right SI joint       Assessment & Plan:  Stable, bilateral superior knee pain and right sacroiliac pain

## 2013-06-26 NOTE — Patient Instructions (Addendum)
Dear Mr. Schara,   You knee pain is caused by bursitis, that I think is related to weakening of your quad muscles of the thigh. You need to strengthen these muscles. Swimming is a great idea. You need to do it 3-4 times a week. Also, you can try the exercises that I have given you on the handout.  Your hip pain is caused by inflammation of sacroiliac joint. It will get better in a few weeks with icing and use of Meloxicam. Also, try exercises on the sheet that I give you.   DO NOT PICK UP PRESCRIPTIONS FOR PREDNISONE OR NAPROXYN. Take only meloxicam for pain.   Come back in 4 weeks.   Sincerely,   Dr. Clinton Sawyer

## 2013-06-28 DIAGNOSIS — M533 Sacrococcygeal disorders, not elsewhere classified: Secondary | ICD-10-CM | POA: Insufficient documentation

## 2013-06-28 NOTE — Assessment & Plan Note (Signed)
Assessment: right sided sacroiliitis Plan: stretch, ice, anti-inflammatory, given handout for exercises, f/u in 4 weeks

## 2013-06-28 NOTE — Assessment & Plan Note (Signed)
Assessment: patient with "knee" pain that is slightly superior to the patella and consistent with suprapatellar bursitis bilaterally Plan: I emphasized need to strengthen his quadriceps and stretch daily, patient given handouts for exercises, but states that he prefers to swim in the [facility] pool; given a refill for meloxicam to use daily and also encouraged to ice

## 2013-07-02 ENCOUNTER — Emergency Department (HOSPITAL_COMMUNITY)
Admission: EM | Admit: 2013-07-02 | Discharge: 2013-07-03 | Disposition: A | Discharge status: Home / Self Care | Payer: Medicare Other | Attending: Emergency Medicine | Admitting: Emergency Medicine

## 2013-07-02 ENCOUNTER — Other Ambulatory Visit: Payer: Self-pay

## 2013-07-02 ENCOUNTER — Emergency Department (HOSPITAL_COMMUNITY): Payer: Medicare Other

## 2013-07-02 DIAGNOSIS — Z79899 Other long term (current) drug therapy: Secondary | ICD-10-CM | POA: Insufficient documentation

## 2013-07-02 DIAGNOSIS — R0789 Other chest pain: Secondary | ICD-10-CM

## 2013-07-02 DIAGNOSIS — F411 Generalized anxiety disorder: Secondary | ICD-10-CM | POA: Insufficient documentation

## 2013-07-02 DIAGNOSIS — F17200 Nicotine dependence, unspecified, uncomplicated: Secondary | ICD-10-CM | POA: Insufficient documentation

## 2013-07-02 DIAGNOSIS — I10 Essential (primary) hypertension: Secondary | ICD-10-CM | POA: Insufficient documentation

## 2013-07-02 DIAGNOSIS — Z8619 Personal history of other infectious and parasitic diseases: Secondary | ICD-10-CM | POA: Insufficient documentation

## 2013-07-02 DIAGNOSIS — R071 Chest pain on breathing: Secondary | ICD-10-CM | POA: Insufficient documentation

## 2013-07-02 LAB — BASIC METABOLIC PANEL
BUN: 13 mg/dL (ref 6–23)
CO2: 23 mEq/L (ref 19–32)
Chloride: 97 mEq/L (ref 96–112)
Creatinine, Ser: 0.85 mg/dL (ref 0.50–1.35)
Glucose, Bld: 93 mg/dL (ref 70–99)

## 2013-07-02 LAB — CBC
HCT: 40.5 % (ref 39.0–52.0)
MCV: 86.5 fL (ref 78.0–100.0)
RBC: 4.68 MIL/uL (ref 4.22–5.81)
WBC: 7.7 10*3/uL (ref 4.0–10.5)

## 2013-07-02 MED ORDER — NITROGLYCERIN 0.4 MG SL SUBL
0.4000 mg | SUBLINGUAL_TABLET | SUBLINGUAL | Status: DC | PRN
  Administered 2013-07-02 (×2): 0.4 mg via SUBLINGUAL
  Filled 2013-07-02: qty 25

## 2013-07-02 MED ORDER — ASPIRIN 81 MG PO CHEW
324.0000 mg | CHEWABLE_TABLET | Freq: Once | ORAL | Status: AC
  Administered 2013-07-02: 324 mg via ORAL
  Filled 2013-07-02: qty 4

## 2013-07-02 MED ORDER — ASPIRIN 325 MG PO TABS: 325.0000 mg | ORAL_TABLET | ORAL | Status: DC

## 2013-07-02 NOTE — ED Provider Notes (Signed)
CSN: [id number]     Arrival date & time 07/02/13  2145 History   First MD Initiated Contact with Patient 07/02/13 2240     Chief Complaint  Patient presents with  . Chest Pain   (Consider location/radiation/quality/duration/timing/severity/associated sxs/prior Treatment) HPI This 62 year old male has no known history of coronary artery disease had sudden onset sharp stabbing anterior chest pain at 9:00 this evening well localized nonradiating without associated symptoms it is worse with palpation and position changes without fever without cough without shortness breath abdominal pain without lightheadedness he does have anxiety, at baseline he has chronic stable right-sided chest pain from postherpetic neuralgia different than tonight's pain. Past Medical History  Diagnosis Date  . Anxiety   . Shingles 2013  . Hypertension    No past surgical history on file. No family history on file. History  Substance Use Topics  . Smoking status: Current Every Day Smoker -- 1.00 packs/day for 50 years  . Smokeless tobacco: Not on file     Comment: 10  cigarettes per day, waits an hour between  . Alcohol Use: No    Review of Systems 10 Systems reviewed and are negative for acute change except as noted in the HPI. Allergies  Review of patient's allergies indicates no known allergies.  Home Medications   Current Outpatient Rx  Name  Route  Sig  Dispense  Refill  . acetaminophen-codeine (TYLENOL #3) 300-30 MG per tablet   Oral   Take 3 tablets by mouth 3 (three) times daily.         Marland Kitchen amLODipine (NORVASC) 5 MG tablet   Oral   Take 1 tablet (5 mg total) by mouth daily.   30 tablet   11   . gabapentin (NEURONTIN) 300 MG capsule   Oral   Take 2 capsules (600 mg total) by mouth 3 (three) times daily.   90 capsule   5   . lidocaine (LIDODERM) 5 %   Transdermal   Place 1 patch onto the skin as needed (pain). Remove & Discard patch within 12 hours or as directed by MD         .  meloxicam (MOBIC) 15 MG tablet   Oral   Take 1 tablet (15 mg total) by mouth daily.   60 tablet   0   . nortriptyline (PAMELOR) 50 MG capsule   Oral   Take 1 capsule (50 mg total) by mouth at bedtime.   30 capsule   2   . valproic acid (DEPAKENE) 250 MG capsule   Oral   Take 750 mg by mouth 2 (two) times daily.           BP 125/71  Pulse 72  Temp(Src) 98.7 F (37.1 C) (Oral)  Resp 18  Wt 176 lb (79.833 kg)  BMI 27.56 kg/m2  SpO2 95% Physical Exam  Nursing note and vitals reviewed. Constitutional:  Awake, alert, nontoxic appearance.  HENT:  Head: Atraumatic.  Eyes: Right eye exhibits no discharge. Left eye exhibits no discharge.  Neck: Neck supple.  Cardiovascular: Normal rate and regular rhythm.   No murmur heard. Pulmonary/Chest: Effort normal and breath sounds normal. No respiratory distress. He has no wheezes. He has no rales. He exhibits tenderness.  Anterior lower sternal region chest wall tenderness to palpation without instability or rash  Abdominal: Soft. There is no tenderness. There is no rebound.  Musculoskeletal: He exhibits no edema and no tenderness.  Baseline ROM, no obvious new focal weakness.  Neurological:  He is alert.  Mental status and motor strength appears baseline for patient and situation.  Skin: No rash noted.  Psychiatric:  Anxious    ED Course  Procedures (including critical care time) ECG: Normal sinus rhythm, ventricular rate 87, normal axis, normal intervals, no acute ischemic changes noted, no comparison ECG available, impression normal ECG  Patient understand and agree with initial ED impression and plan with expectations set for ED visit. Clinically suspect chest wall pain, doubt pulmonary embolism with d-dimer normal, initial 2 sets of troponin normal, if third of troponin negative anticipate discharge. Labs Review Labs Reviewed  BASIC METABOLIC PANEL - Abnormal; Notable for the following:    Sodium 133 (*)    All other  components within normal limits  CBC  D-DIMER, QUANTITATIVE  POCT I-STAT TROPONIN I  POCT I-STAT TROPONIN I  POCT I-STAT TROPONIN I   Imaging Review Dg Chest Port 1 View  07/02/2013   *RADIOLOGY REPORT*  Clinical Data: Chest pain.  PORTABLE CHEST - 1 VIEW  Comparison: 08/30/2007.  Findings: Chronic interstitial coarsening and borderline hyperinflation.  No acute infiltrate, edema, effusion, pneumothorax.  Normal heart size.  Aortic atherosclerosis.  No acute osseous findings.  IMPRESSION:  No evidence of active intrathoracic disease.   Original Report Authenticated By: Tiburcio Pea    MDM   1. Chest wall pain    Care endorsed to Dr. Elesa Massed.    Hurman Horn, MD 07/03/13 2127

## 2013-07-02 NOTE — ED Notes (Signed)
Pt presents with sudden onset chest pain that started at 2100- shortness of breath associated with symptoms, lung sounds clear throughout.  Pt denies radiation of pain.  NSR on monitor, IV in place.  Dr. Fonnie Jarvis at bedside.

## 2013-07-02 NOTE — ED Notes (Addendum)
Presents with sternal chest pain described as, "it feels like my chest will explode" pain began at 2100, associated with SOB, denies nausea, denies diaphoresis, denies dizziness. Pt is tearful. Bilateral breath sounds clear.  Pain is intermittent.  Denies use of viagra today.

## 2013-07-03 LAB — POCT I-STAT TROPONIN I
Troponin i, poc: 0.01 ng/mL (ref 0.00–0.08)
Troponin i, poc: 0.01 ng/mL (ref 0.00–0.08)

## 2013-07-03 NOTE — ED Provider Notes (Signed)
4:38 AM  Assumed care from Dr. Fonnie Jarvis.  Pt is a 62 y.o. M with h/o anxiety and HTN presents the emergency department with chest wall pain. He has had 3 sets of negative cardiac enzymes. He is hemodynamically stable. We'll discharge home with outpatient followup.  Layla Maw Quintel Mccalla, DO 07/03/13 [phone]

## 2013-07-24 ENCOUNTER — Encounter: Payer: Self-pay | Admitting: Family Medicine

## 2013-07-24 ENCOUNTER — Ambulatory Visit (INDEPENDENT_AMBULATORY_CARE_PROVIDER_SITE_OTHER): Payer: Medicare Other | Admitting: Family Medicine

## 2013-07-24 VITALS — BP 134/88 | HR 89 | Temp 98.5°F | Wt 171.0 lb

## 2013-07-24 DIAGNOSIS — L301 Dyshidrosis [pompholyx]: Secondary | ICD-10-CM

## 2013-07-24 DIAGNOSIS — T148 Other injury of unspecified body region: Secondary | ICD-10-CM

## 2013-07-24 DIAGNOSIS — W57XXXA Bitten or stung by nonvenomous insect and other nonvenomous arthropods, initial encounter: Secondary | ICD-10-CM

## 2013-07-24 MED ORDER — TRIAMCINOLONE ACETONIDE 0.1 % EX CREA: TOPICAL_CREAM | Freq: Two times a day (BID) | CUTANEOUS | Status: DC

## 2013-07-24 NOTE — Patient Instructions (Signed)
Dear Mr. Maye,   Thank you for coming to clinic today. Please read below regarding the issues that we discussed.   1. Rash on arm: this looks like flea bites to me. You need to make sure your dog is properly treated. Also recommend using one of the flea bombs inside her house.  2. Dry skin on hands: you have a form of eczema which is inflammation of the skin. You need to use a steroid cream twice a day for the next 3 weeks  3. Now is the best time to quit smoking. Let me know when you're ready to quit  Please follow up in clinic in 4 weeks this right-handed daily and check on her shoulder. Please call earlier if you have any questions or concerns.   Sincerely,   Dr. Clinton Sawyer

## 2013-07-24 NOTE — Progress Notes (Signed)
  Subjective:    Patient ID: Bryan Davies, male    DOB: 01-09-1951, 62 y.o.   MRN: [id number]  HPI  Rash on right arm - 2 weeks duration, itches, non painful, no drainage, no other parts, no known contacts with similar rash, does have a dog with fleas  Dryness of hands - bilaterally, only washes hands 7 times day after using the restroom using Dove soap, uses OTC moisturizer with Vitamin E daily, denies handling of chemicals or potentially caustic substances    Review of Systems Negative unless stated in HPI    Objective:   Physical Exam BP 161/99  Pulse 89  Temp(Src) 98.5 F (36.9 C) (Oral)  Wt 171 lb (77.565 kg)  BMI 26.78 kg/m2 Gen: middle age WM, non ill appearing, smells like cigarettes Skin right forearm: 2 red discrete papules in healing stage, closely grouped without erythema or exudate Hands: very dry palms and intertriginous areas with cracking and mild bleeding, no evidence of excoriation       Assessment & Plan:

## 2013-07-26 DIAGNOSIS — L301 Dyshidrosis [pompholyx]: Secondary | ICD-10-CM | POA: Insufficient documentation

## 2013-07-26 DIAGNOSIS — W57XXXA Bitten or stung by nonvenomous insect and other nonvenomous arthropods, initial encounter: Secondary | ICD-10-CM | POA: Insufficient documentation

## 2013-07-26 NOTE — Assessment & Plan Note (Signed)
Assessment: likely flea bites given appearance, distribution, and exposure Plan: self-limited, must treat dog

## 2013-07-26 NOTE — Assessment & Plan Note (Signed)
Assessment: marked dryness of palms and dorsum of hands without other indication  Plan: use medium potency triamcinolone 0.1% BID

## 2013-08-17 ENCOUNTER — Ambulatory Visit (INDEPENDENT_AMBULATORY_CARE_PROVIDER_SITE_OTHER): Payer: Medicare Other | Admitting: Family Medicine

## 2013-08-17 ENCOUNTER — Encounter: Payer: Self-pay | Admitting: Family Medicine

## 2013-08-17 ENCOUNTER — Ambulatory Visit: Payer: Medicare Other | Admitting: Family Medicine

## 2013-08-17 VITALS — BP 136/84 | HR 88 | Ht 67.0 in | Wt 177.0 lb

## 2013-08-17 DIAGNOSIS — M25561 Pain in right knee: Secondary | ICD-10-CM

## 2013-08-17 DIAGNOSIS — M25569 Pain in unspecified knee: Secondary | ICD-10-CM

## 2013-08-17 NOTE — Assessment & Plan Note (Signed)
Right knee pain resolving and left knee pain persistent and still likely suprapatellar bursitis. There is no evidence of swelling decreased range of motion which was limited the patient. Therefore no indication for further imaging or aspiration at this time Plan: given prescription for another compression sleeve and encouraged to use anti-inflammatories and exercise

## 2013-08-17 NOTE — Progress Notes (Signed)
  Subjective:    Patient ID: Bryan Davies, male    DOB: 12-Jul-1951, 62 y.o.   MRN: [id number]  HPI  62 year old M with left knee pain.   Left knee pain - Present for several month. Last addressed in August and diagnosed with suprapatellar bursitis. He was use a compression sleeve and taking Mobic for relief. He was also encouraged to exercise. Today he notes persistent knee pain superior to the patella and the same area where it was. He has not worn the knee compression sleeve for approximately 3 weeks, which he believed it was helping. He is still taking meloxicam daily. He is not regularly exercising. He denies any recent falls or swelling of the area    Review of Systems     Objective:   Physical Exam BP 136/84  Pulse 88  Ht 5\' 7"  (1.702 m)  Wt 177 lb (80.287 kg)  BMI 27.72 kg/m2  Gen.: elderly white male, smells strongly of cigarettes  Knee Exam:  Laterality: left Appearance:  Edema: no   Tenderness: yes superior to patella  Range of Motion: Passive Extension: normal Flexion:normal Active Extension: normal Flexion: normal Strength:  Quadricep: 5/5 Hamstring: 5/5 Gait: normal          Assessment & Plan:

## 2013-08-17 NOTE — Patient Instructions (Signed)
It was nice to see you today Mr. [place]. Please call your insurance company to see if the brace will be paid for. I am not sure that it will. Also, I have refilled the anti-inflammatory medication for you.   Please come back in 1-2 weeks to discuss the left calf pain.   Sincerely,   Dr. Clinton Sawyer

## 2013-08-21 ENCOUNTER — Telehealth: Payer: Self-pay | Admitting: Family Medicine

## 2013-08-21 DIAGNOSIS — B0229 Other postherpetic nervous system involvement: Secondary | ICD-10-CM

## 2013-08-21 MED ORDER — GABAPENTIN 300 MG PO CAPS: 600.0000 mg | ORAL_CAPSULE | Freq: Three times a day (TID) | ORAL | Status: DC

## 2013-08-21 NOTE — Addendum Note (Signed)
Addended by: Garnetta Buddy on: 08/21/2013 06:01 PM   Modules accepted: Orders

## 2013-08-21 NOTE — Telephone Encounter (Signed)
Will fwd to Md.  Vadhir Mcnay L, CMA  

## 2013-08-21 NOTE — Telephone Encounter (Signed)
He is out of gabentin Pharmacy-[facility] on [address]

## 2013-08-21 NOTE — Telephone Encounter (Signed)
Rx sent to pharmacy   

## 2013-08-26 ENCOUNTER — Ambulatory Visit (INDEPENDENT_AMBULATORY_CARE_PROVIDER_SITE_OTHER): Payer: Medicare Other | Admitting: Family Medicine

## 2013-08-26 ENCOUNTER — Encounter: Payer: Self-pay | Admitting: Family Medicine

## 2013-08-26 VITALS — BP 136/89 | HR 93 | Ht 67.0 in | Wt 171.0 lb

## 2013-08-26 DIAGNOSIS — IMO0001 Reserved for inherently not codable concepts without codable children: Secondary | ICD-10-CM

## 2013-08-26 DIAGNOSIS — M25512 Pain in left shoulder: Secondary | ICD-10-CM

## 2013-08-26 DIAGNOSIS — M25519 Pain in unspecified shoulder: Secondary | ICD-10-CM

## 2013-08-26 DIAGNOSIS — M791 Myalgia, unspecified site: Secondary | ICD-10-CM

## 2013-08-26 DIAGNOSIS — R51 Headache: Secondary | ICD-10-CM

## 2013-08-26 MED ORDER — METHYLPREDNISOLONE ACETATE 40 MG/ML IJ SUSP
40.0000 mg | Freq: Once | INTRAMUSCULAR | Status: AC
  Administered 2013-08-26: 40 mg via INTRA_ARTICULAR

## 2013-08-26 MED ORDER — ASPIRIN 325 MG PO TABS
325.0000 mg | ORAL_TABLET | Freq: Once | ORAL | Status: AC
  Administered 2013-08-26: 325 mg via ORAL

## 2013-08-26 MED ORDER — ASPIRIN 325 MG PO TABS: 325.0000 mg | ORAL_TABLET | Freq: Every day | ORAL | Status: DC

## 2013-08-26 NOTE — Progress Notes (Signed)
  Subjective:    Patient ID: Bryan Davies, male    DOB: 11-Dec-1950, 62 y.o.   MRN: [id number]  HPI  62 year old M who presents for pain of numerous locations. This is a subacute process as he has had numerous visits in the past couple months for shoulder pain, knee pain, chest wall pain and back pain. The most bothersome is his left shoulder.   Left Shoulder Pain - approximately 8-9 mos duration, had steroid injection in May 2014 for presumed rotator cuff impingement, pt notes that this provided relief for 2 months, currently pain is exacerbated by walking his dog who pulls on his arm and even lifting his arm with the phone is painful; relief with rest; is not using any anti-inflammatory, no regular exercise; no hx of imaging or surgery    Current Outpatient Prescriptions on File Prior to Visit  Medication Sig Dispense Refill  . acetaminophen-codeine (TYLENOL #3) 300-30 MG per tablet Take 3 tablets by mouth 3 (three) times daily.      Marland Kitchen amLODipine (NORVASC) 5 MG tablet Take 1 tablet (5 mg total) by mouth daily.  30 tablet  11  . gabapentin (NEURONTIN) 300 MG capsule Take 2 capsules (600 mg total) by mouth 3 (three) times daily.  90 capsule  5  . lidocaine (LIDODERM) 5 % Place 1 patch onto the skin as needed (pain). Remove & Discard patch within 12 hours or as directed by MD      . nortriptyline (PAMELOR) 50 MG capsule Take 1 capsule (50 mg total) by mouth at bedtime.  30 capsule  2  . triamcinolone cream (KENALOG) 0.1 % Apply topically 2 (two) times daily.  454 g  1  . valproic acid (DEPAKENE) 250 MG capsule Take 750 mg by mouth 2 (two) times daily.        No current facility-administered medications on file prior to visit.      Review of Systems Chest wall pain from post-herpetic neuralgia     Objective:   Physical Exam BP 136/89  Pulse 93  Ht 5\' 7"  (1.702 m)  Wt 171 lb (77.565 kg)  BMI 26.78 kg/m2  Shoulder Exam - left Inspection:  Palpation:   Clavicle:  normal   AC Joint:   normal  Scapula: normal             Biceps Tendon: normal  ROM/Strength:  Abduction (Suprapsinatus): normal  Internal Rotation/Liftoff (Subsapularis):normal  External Rotation (Infraspinatus/Teres Minor): normal Maneuvers:   Neer's: Positive  Hawkin's: Positive  Empty Can/Jobes for supraspinatus: Negative  Drop Arm: Negative Obrien's test for labrum tear: positive for pain but not weakness      Assessment & Plan:  Subacromial Injection: left sided  Consent obtained and time out performed.  Area cleaned with betadine and alcohol.  40Mg  of depomedrol and 4ml of 2% lidocaine was injected into the subacromial bursa without complication or bleeding. Patient tolerated the procedure well.

## 2013-08-26 NOTE — Patient Instructions (Signed)
Mr. Richart,  It was nice to see you today. Please read below.   Shoulder Pain - We injected it with steroids today and hopefully this will provide relief. Please follow up with the sports medicine center for an ultrasound of the shoulder.  Muscle Pain - I am going to check 2 blood labs today and will send you a letter with the results.   Come back after you see the sports medicine doctors.   Sincerely,   Dr. Clinton Sawyer

## 2013-08-27 ENCOUNTER — Encounter: Payer: Self-pay | Admitting: Family Medicine

## 2013-08-27 ENCOUNTER — Ambulatory Visit: Payer: Medicare Other | Admitting: Family Medicine

## 2013-08-27 LAB — SEDIMENTATION RATE: Sed Rate: 3 mm/hr (ref 0–16)

## 2013-08-28 NOTE — Assessment & Plan Note (Signed)
Concern for rotator cuff tendinopathy vs impingement, bu exam equivocal. No weakness or neurologic signs requiring imaging. Patient offered steroid injection and accepted. See Procedure note. Follow up at sports medicine for evaluation with ultrasound.

## 2013-08-31 ENCOUNTER — Telehealth: Payer: Self-pay | Admitting: Family Medicine

## 2013-08-31 NOTE — Telephone Encounter (Signed)
Please tell the patient that anytime he needs a medication refill to call his pharmacy. Additionally, tell him that I refilled his blood pressure medication in August with enough refills to last a year.

## 2013-08-31 NOTE — Telephone Encounter (Signed)
Needs blood pressure med refilled. [facility] on [address]

## 2013-09-01 NOTE — Telephone Encounter (Signed)
Called pt. LMVM to call us back. Please see Dr.Williamson's message. Thanks. Lorenda Hatchet, Renato Battles (waiting for call back)

## 2013-09-03 NOTE — Telephone Encounter (Signed)
tgot message on both number dialed that number is incorrect.Bryan Davies

## 2013-09-09 ENCOUNTER — Telehealth: Payer: Self-pay

## 2013-09-09 NOTE — Telephone Encounter (Signed)
Patient request RX for anti-fungal medication to clear fungus under nails and toenails.

## 2013-09-10 NOTE — Telephone Encounter (Signed)
Called pt. Both numbers are INCORRECT. Waiting for pt to call back. Please tell pt, that he needs OV for evaluation of his nail fungus. We do not rx anti-fungal medications without evaluation. Thank you. Lorenda Hatchet, Renato Battles

## 2013-09-16 ENCOUNTER — Encounter: Payer: Self-pay | Admitting: Family Medicine

## 2013-09-16 ENCOUNTER — Ambulatory Visit (INDEPENDENT_AMBULATORY_CARE_PROVIDER_SITE_OTHER): Payer: Medicare Other | Admitting: Family Medicine

## 2013-09-16 VITALS — BP 165/84 | HR 98 | Temp 97.6°F | Wt 173.0 lb

## 2013-09-16 DIAGNOSIS — F17200 Nicotine dependence, unspecified, uncomplicated: Secondary | ICD-10-CM

## 2013-09-16 DIAGNOSIS — Z23 Encounter for immunization: Secondary | ICD-10-CM

## 2013-09-16 DIAGNOSIS — B0229 Other postherpetic nervous system involvement: Secondary | ICD-10-CM

## 2013-09-16 DIAGNOSIS — Z720 Tobacco use: Secondary | ICD-10-CM

## 2013-09-16 DIAGNOSIS — M25569 Pain in unspecified knee: Secondary | ICD-10-CM

## 2013-09-16 DIAGNOSIS — M25561 Pain in right knee: Secondary | ICD-10-CM

## 2013-09-16 DIAGNOSIS — L608 Other nail disorders: Secondary | ICD-10-CM

## 2013-09-16 DIAGNOSIS — L603 Nail dystrophy: Secondary | ICD-10-CM

## 2013-09-16 MED ORDER — MELOXICAM 15 MG PO TABS: 15.0000 mg | ORAL_TABLET | Freq: Every day | ORAL | Status: DC

## 2013-09-16 MED ORDER — NORTRIPTYLINE HCL 50 MG PO CAPS: 100.0000 mg | ORAL_CAPSULE | Freq: Every day | ORAL | Status: DC

## 2013-09-16 NOTE — Progress Notes (Signed)
  Subjective:    Patient ID: Bryan Davies, male    DOB: June 03, 1951, 62 y.o.   MRN: [id number]  HPI  62 year old M with hypertension, post-herpetic neuralgia, and tobacco abuse who presents for evaluation.   Nail "fungus": pt concerned about thickened brittle nails, worsening and breaking over last few months, patient is worried that this is caused by a fungus and wants a medication for fungus, never been treated fr this, patient smokes 2-3 ppd of cigarettes and he not considered that as cause  Cigarette Smoking  - 1 ppd for > 40 years, has quit for 3 years at a time greater than 10 years ago, smoking helps with stress that is going on in his life; understands benefits of quitting but not interested at this time   Left Knee Pain - persistent problem, duration for approximately 6 months, first diagnosed with suprapatellar bursitis and encouraged to use meloxicam as well as daily exercises; patient followed up in October for the problem and had not done any exercising but was using meloxicam with minimal relief; again he was encouraged to do daily exercises; patient presents today with pain in the same location superior to the left kneecap that is tender to the touch and worsened with walking; it is not swollen; he has not been exercising; he is specifically requesting anti-inflammatory medication for the pain   Review of Systems Positive for left shoulder pain, left elbow pain, bilateral foot pain    Objective:   Physical Exam BP 165/84  Pulse 98  Temp(Src) 97.6 F (36.4 C) (Oral)  Wt 173 lb (78.472 kg) Gen: middle age 62, non ill appearing, dissheveled Finger Nails: thickened, brittle, ridges with vertical splintering (onychorrhexis) and trachyonychia  Knee Exam:  Laterality: left Appearance: normal appearing Edema: no   Tenderness: yes  Suprapatellar area exquisitely tender Range of Motion: Passive Extension: normal Flexion:normal Active Extension: normal Flexion: normal Strength:   Quadricep: 5/5 Hamstring: 5/5 Gait: normal        Assessment & Plan:

## 2013-09-16 NOTE — Assessment & Plan Note (Signed)
Assessment: left knee pain worse than right and history and physical exam findings are consistent with suprapatellar bursitis and possible quad tendinitis or tendinopathy; unfortunately patient has not done any exercising rehabilitation for this issue Plan:  - Given refill of Mobic, encouraged exercise, - Will check X-ray today to look for  - Also asked patient to address with Sports Medicine at upcoming visit

## 2013-09-16 NOTE — Patient Instructions (Signed)
Mr. Peruski,   It was nice to see you.  Left Knee Pain - I still think you have bursitis and tendonitis of the knee causing this pain. You may take the anti-inflammatory each day. You still need to do exercises for this to strengthen this area. Today we can get x-rays to see if there is arthritis because you had this pain for several months.  Left shoulder pain - It is too early for another injection and and not a good idea to get another one any way because last one didn't work. Please followup with sports medicine tomorrow at 10 AM so they can evaluate your shoulder.  Generalized Pain - You're having pain in numerous joints which I'm concerned as part of a general pain syndrome because your markers of inflammation and muscle breakdown are both negative. Therefore I want you to increase your nortriptyline medication to 100 mg or 2 tablets each night. This medication is good for generalized pain.   Fingernails - This may be the result of some fungus, but more likely it is caused by smoking. If you do not quit smokingthe nails will continue to breakdown. I realize that he smoked to deal with stress, please let us know we can do to help you with your desire to quit.  Followup in 2 weeks to check her blood pressure.   Sincerely,   Dr. Clinton Sawyer

## 2013-09-17 ENCOUNTER — Ambulatory Visit (INDEPENDENT_AMBULATORY_CARE_PROVIDER_SITE_OTHER): Payer: Medicare Other | Admitting: Sports Medicine

## 2013-09-17 ENCOUNTER — Encounter: Payer: Self-pay | Admitting: Sports Medicine

## 2013-09-17 VITALS — BP 109/76 | Ht 67.0 in | Wt 173.0 lb

## 2013-09-17 DIAGNOSIS — M25522 Pain in left elbow: Secondary | ICD-10-CM

## 2013-09-17 DIAGNOSIS — M25569 Pain in unspecified knee: Secondary | ICD-10-CM

## 2013-09-17 DIAGNOSIS — M25561 Pain in right knee: Secondary | ICD-10-CM

## 2013-09-17 DIAGNOSIS — M25529 Pain in unspecified elbow: Secondary | ICD-10-CM

## 2013-09-17 NOTE — Progress Notes (Signed)
  Subjective:    Patient ID: Bryan Davies, male    DOB: [birth year], 62 y.o.   MRN: [id number]  HPI chief complaint: Left elbow, bilateral knee pain  62 year old male patient is referred by family practice for evaluation. He has multiple complaints. He complains of left elbow pain which began without any trauma. He describes a burning and aching discomfort. Also gets pain in the distal quadriceps of each knee, left greater than right. He is not noticed any swelling. He recently saw his PCP yesterday who ordered x-rays of his knee and placed him on meloxicam. He has yet to get the x-ray and has yet to pick up his medicine. He is interested in some type of compression sleeves for both his knees and his elbow. He denies any recent trauma. No fevers or chills.  Medical history and current medications are reviewed    Review of Systems     Objective:   Physical Exam Well-developed, no acute distress. Sitting correctly in the exam room  Left arm: There is a palpable lipoma along the lateral midportion of the humerus. It does not appear to be terribly tender to palpation. There is no significant soft tissue swelling. He has full range of motion at the elbow with no effusion. No soft tissue swelling here. Some tenderness over the common extensor tendon but not marked. Neurovascularly intact distally.  Examination of each knee shows good range of motion. No effusion. No soft tissue swelling. He is tender to palpation along the quadriceps tendon bilaterally, left greater than right. No palpable defect. No ecchymosis. Knees are stable ligamentous exam. He is neurovascular intact distally. He walks without a limp.       Assessment & Plan:  Diffuse body aches to include bilateral knee and left elbow pain  I agree with his primary care physician that he should try an oral anti-inflammatory. Patient will pick up the meloxicam at his pharmacy as ordered by his PCP. I've also encouraged him to go ahead and  get the x-ray of his knee ordered by his PCP as well. I've given him a prescription for bilateral knee compression sleeves in the left elbow sleeve. He will take the prescription to [facility] medical supply. He understands that if insurance does not cover it he may have to pay for them out of pocket. Followup with me when necessary.

## 2013-09-20 DIAGNOSIS — L603 Nail dystrophy: Secondary | ICD-10-CM | POA: Insufficient documentation

## 2013-09-20 NOTE — Assessment & Plan Note (Signed)
A: pt w/ persistent 1/2 ppd - 1ppd smoker, in pre contemplation phase P: pt not ready to quit so no strategies discussed

## 2013-09-20 NOTE — Assessment & Plan Note (Signed)
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

## 2013-09-27 ENCOUNTER — Ambulatory Visit (HOSPITAL_COMMUNITY)
Admission: RE | Admit: 2013-09-27 | Discharge: 2013-09-27 | Disposition: A | Discharge status: Home / Self Care | Payer: Medicare Other | Source: Ambulatory Visit | Attending: Family Medicine | Admitting: Family Medicine

## 2013-09-27 DIAGNOSIS — M25561 Pain in right knee: Secondary | ICD-10-CM

## 2013-09-27 DIAGNOSIS — M25569 Pain in unspecified knee: Secondary | ICD-10-CM | POA: Insufficient documentation

## 2013-10-01 ENCOUNTER — Encounter (HOSPITAL_COMMUNITY): Payer: Self-pay | Admitting: Emergency Medicine

## 2013-10-01 ENCOUNTER — Emergency Department (HOSPITAL_COMMUNITY)
Admission: EM | Admit: 2013-10-01 | Discharge: 2013-10-01 | Disposition: A | Discharge status: Home / Self Care | Payer: Medicare Other | Attending: Emergency Medicine | Admitting: Emergency Medicine

## 2013-10-01 DIAGNOSIS — F411 Generalized anxiety disorder: Secondary | ICD-10-CM | POA: Insufficient documentation

## 2013-10-01 DIAGNOSIS — F17200 Nicotine dependence, unspecified, uncomplicated: Secondary | ICD-10-CM | POA: Insufficient documentation

## 2013-10-01 DIAGNOSIS — Z79899 Other long term (current) drug therapy: Secondary | ICD-10-CM | POA: Insufficient documentation

## 2013-10-01 DIAGNOSIS — Z8619 Personal history of other infectious and parasitic diseases: Secondary | ICD-10-CM | POA: Insufficient documentation

## 2013-10-01 DIAGNOSIS — I10 Essential (primary) hypertension: Secondary | ICD-10-CM | POA: Insufficient documentation

## 2013-10-01 DIAGNOSIS — M25512 Pain in left shoulder: Secondary | ICD-10-CM

## 2013-10-01 DIAGNOSIS — M25519 Pain in unspecified shoulder: Secondary | ICD-10-CM | POA: Insufficient documentation

## 2013-10-01 HISTORY — DX: Pain in unspecified shoulder: M25.519

## 2013-10-01 MED ORDER — KETOROLAC TROMETHAMINE 30 MG/ML IJ SOLN
30.0000 mg | Freq: Once | INTRAMUSCULAR | Status: AC
  Administered 2013-10-01: 30 mg via INTRAMUSCULAR
  Filled 2013-10-01: qty 1

## 2013-10-01 MED ORDER — HYDROCODONE-ACETAMINOPHEN 5-325 MG PO TABS: ORAL_TABLET | ORAL | Status: DC

## 2013-10-01 NOTE — ED Provider Notes (Signed)
Medical screening examination/treatment/procedure(s) were performed by non-physician practitioner and as supervising physician I was immediately available for consultation/collaboration.   Dione Booze, MD 10/01/13 [phone]

## 2013-10-01 NOTE — ED Provider Notes (Signed)
CSN: [id number]     Arrival date & time 10/01/13  0030 History   First MD Initiated Contact with Patient 10/01/13 [phone]     Chief Complaint  Patient presents with  . Shoulder Pain   (Consider location/radiation/quality/duration/timing/severity/associated sxs/prior Treatment) HPI Comments: Patient with history of left shoulder pain for the past 7 months, managed by his PCP. Patient notes worsening pain in his left shoulder since his last steroid injection 1 month ago. He states that he could not sleep last night because of pain. He is taking Mobic, Neurontin, Depakote at home. No other treatments prior to arrival. Patient has decreased range of motion. No fevers, nausea or vomiting. No skin changes or rash. Patient requests injection in the shoulder. The onset of this condition was chronic. The course is worsening. Aggravating factors: movement. Alleviating factors: none.    The history is provided by the patient.    Past Medical History  Diagnosis Date  . Anxiety   . Shingles 2013  . Hypertension   . Shoulder pain    History reviewed. No pertinent past surgical history. No family history on file. History  Substance Use Topics  . Smoking status: Current Every Day Smoker -- 1.00 packs/day for 50 years  . Smokeless tobacco: Not on file     Comment: 10  cigarettes per day, waits an hour between  . Alcohol Use: No    Review of Systems  Constitutional: Negative for activity change.  Musculoskeletal: Positive for arthralgias and myalgias. Negative for back pain, gait problem, joint swelling and neck pain.  Skin: Negative for wound.  Neurological: Negative for weakness and numbness.    Allergies  Review of patient's allergies indicates no known allergies.  Home Medications   Current Outpatient Rx  Name  Route  Sig  Dispense  Refill  . amLODipine (NORVASC) 5 MG tablet   Oral   Take 1 tablet (5 mg total) by mouth daily.   30 tablet   11   . gabapentin (NEURONTIN) 300 MG  capsule   Oral   Take 2 capsules (600 mg total) by mouth 3 (three) times daily.   90 capsule   5   . meloxicam (MOBIC) 15 MG tablet   Oral   Take 1 tablet (15 mg total) by mouth daily.   30 tablet   1   . valproic acid (DEPAKENE) 250 MG capsule   Oral   Take 750 mg by mouth 2 (two) times daily.           BP 155/73  Pulse 99  Temp(Src) 98.3 F (36.8 C)  Resp 18  SpO2 98% Physical Exam  Nursing note and vitals reviewed. Constitutional: He appears well-developed and well-nourished.  HENT:  Head: Normocephalic and atraumatic.  Eyes: Conjunctivae are normal.  Neck: Normal range of motion. Neck supple.  Cardiovascular: Normal pulses.   Pulses:      Radial pulses are 2+ on the right side, and 2+ on the left side.  Musculoskeletal: He exhibits tenderness. He exhibits no edema.       Left shoulder: He exhibits decreased range of motion (cannot raise arm above shoulder height) and tenderness (lateral posterior, mild). He exhibits no bony tenderness.       Left elbow: Normal.       Left wrist: Normal.       Cervical back: Normal. He exhibits normal range of motion, no tenderness and no bony tenderness.  Neurological: He is alert. No sensory deficit.  Motor, sensation,  and vascular distal to the injury is fully intact.   Skin: Skin is warm and dry.  Psychiatric: He has a normal mood and affect.    ED Course  Procedures (including critical care time) Labs Review Labs Reviewed - No data to display Imaging Review No results found.  EKG Interpretation   None      1:02 AM Patient seen and examined. Medications ordered.   Vital signs reviewed and are as follows: Filed Vitals:   10/01/13 0033  BP: 155/73  Pulse: 99  Temp: 98.3 F (36.8 C)  Resp: 18   Patient counseled on use of narcotic pain medications. Counseled not to combine these medications with others containing tylenol. Urged not to drink alcohol, drive, or perform any other activities that requires focus  while taking these medications. The patient verbalizes understanding and agrees with the plan.   MDM   1. Shoulder pain, left    Toradol here. Continue Mobic at home. Vicodin for home. PCP f/u encouraged. UE is neurovascularly intact.     Renne Crigler, PA-C 10/01/13 [phone]

## 2013-10-01 NOTE — ED Notes (Addendum)
C/o L shoulder pain x 7 months.  States he has already seen a specialist for same and had cortisone injections.  Reports continued pain and unable to sleep last night due to pain.

## 2013-10-03 ENCOUNTER — Emergency Department (HOSPITAL_COMMUNITY)
Admission: EM | Admit: 2013-10-03 | Discharge: 2013-10-03 | Disposition: A | Discharge status: Home / Self Care | Payer: Medicare Other | Attending: Emergency Medicine | Admitting: Emergency Medicine

## 2013-10-03 ENCOUNTER — Encounter (HOSPITAL_COMMUNITY): Payer: Self-pay | Admitting: Emergency Medicine

## 2013-10-03 DIAGNOSIS — Z79899 Other long term (current) drug therapy: Secondary | ICD-10-CM | POA: Insufficient documentation

## 2013-10-03 DIAGNOSIS — M25519 Pain in unspecified shoulder: Secondary | ICD-10-CM | POA: Insufficient documentation

## 2013-10-03 DIAGNOSIS — I10 Essential (primary) hypertension: Secondary | ICD-10-CM | POA: Insufficient documentation

## 2013-10-03 DIAGNOSIS — F411 Generalized anxiety disorder: Secondary | ICD-10-CM | POA: Insufficient documentation

## 2013-10-03 DIAGNOSIS — M25512 Pain in left shoulder: Secondary | ICD-10-CM

## 2013-10-03 DIAGNOSIS — F17200 Nicotine dependence, unspecified, uncomplicated: Secondary | ICD-10-CM | POA: Insufficient documentation

## 2013-10-03 DIAGNOSIS — Z791 Long term (current) use of non-steroidal anti-inflammatories (NSAID): Secondary | ICD-10-CM | POA: Insufficient documentation

## 2013-10-03 MED ORDER — IBUPROFEN 800 MG PO TABS
800.0000 mg | ORAL_TABLET | Freq: Once | ORAL | Status: AC
  Administered 2013-10-03: 800 mg via ORAL
  Filled 2013-10-03: qty 1

## 2013-10-03 NOTE — ED Notes (Signed)
Pt reports having pain in his left shoulder x 7 months. Pt reports seeing his PCP about this pain, but his pain was not managed. Pt is also reporting left elbow pain.

## 2013-10-03 NOTE — ED Notes (Signed)
[place], MD at bedside.  

## 2013-10-03 NOTE — ED Provider Notes (Signed)
CSN: [id number]     Arrival date & time 10/03/13  0356 History   First MD Initiated Contact with Patient 10/03/13 [phone]     Chief Complaint  Patient presents with  . Shoulder Pain    Patient is a 62 y.o. male presenting with shoulder pain. The history is provided by the patient.  Shoulder Pain This is a chronic problem. The current episode started more than 1 week ago. The problem occurs daily. The problem has been gradually worsening. Pertinent negatives include no chest pain and no shortness of breath. Exacerbated by: palpation. The symptoms are relieved by rest.  pt reports left shoulder painx7 months Seems worse when he is walking his dog and his dog chases a squirrel No falls/direct trauma No cp/sob No focal weakness   Past Medical History  Diagnosis Date  . Anxiety   . Shingles 2013  . Hypertension   . Shoulder pain    History reviewed. No pertinent past surgical history. History reviewed. No pertinent family history. History  Substance Use Topics  . Smoking status: Current Every Day Smoker -- 0.50 packs/day for 50 years    Types: Cigarettes  . Smokeless tobacco: Not on file     Comment: 10  cigarettes per day, waits an hour between  . Alcohol Use: No    Review of Systems  Respiratory: Negative for shortness of breath.   Cardiovascular: Negative for chest pain.    Allergies  Review of patient's allergies indicates no known allergies.  Home Medications   Current Outpatient Rx  Name  Route  Sig  Dispense  Refill  . amLODipine (NORVASC) 5 MG tablet   Oral   Take 1 tablet (5 mg total) by mouth daily.   30 tablet   11   . gabapentin (NEURONTIN) 300 MG capsule   Oral   Take 2 capsules (600 mg total) by mouth 3 (three) times daily.   90 capsule   5   . HYDROcodone-acetaminophen (NORCO/VICODIN) 5-325 MG per tablet      Take 1-2 tablets every 6 hours as needed for severe pain   8 tablet   0   . meloxicam (MOBIC) 15 MG tablet   Oral   Take 1 tablet (15 mg  total) by mouth daily.   30 tablet   1   . valproic acid (DEPAKENE) 250 MG capsule   Oral   Take 750 mg by mouth 2 (two) times daily.           BP 147/85  Pulse 98  Temp(Src) 98.1 F (36.7 C) (Oral)  Resp 18  SpO2 95% Physical Exam CONSTITUTIONAL: Well developed/well nourished HEAD: Normocephalic/atraumatic EYES: EOMI ENMT: Mucous membranes moist NECK: supple no meningeal signs SPINE:entire spine nontender CV: S1/S2 noted, no murmurs/rubs/gallops noted LUNGS: Lungs are clear to auscultation bilaterally, no apparent distress ABDOMEN: soft, nontender, no rebound or guarding NEURO: Pt is awake/alert, moves all extremitiesx4 EXTREMITIES: pulses normal, full ROM. Mild tenderness to left scapula. No erythema/warmth/deformity noted to left shoulder.  He can range the left shoulder but is limited due to pain SKIN: warm, color normal PSYCH: no abnormalities of mood noted  ED Course  Procedures (including critical care time) Labs Review Labs Reviewed - No data to display Imaging Review No results found.  EKG Interpretation   None       MDM   1. Left shoulder pain    Nursing notes including past medical history and social history reviewed and considered in documentation Previous records  reviewed and considered - recent ED visit reviewed  Advised need for ortho/PCP followup He has had imaging previously (recent CXR) that did not reveal any bony injury and his pain has been present for 7 months     Joya Gaskins, MD 10/03/13 0501

## 2013-10-08 ENCOUNTER — Ambulatory Visit (INDEPENDENT_AMBULATORY_CARE_PROVIDER_SITE_OTHER): Payer: Medicare Other | Admitting: Family Medicine

## 2013-10-08 ENCOUNTER — Encounter: Payer: Self-pay | Admitting: Family Medicine

## 2013-10-08 VITALS — BP 113/78 | HR 97 | Ht 67.0 in | Wt 169.0 lb

## 2013-10-08 DIAGNOSIS — Z720 Tobacco use: Secondary | ICD-10-CM

## 2013-10-08 DIAGNOSIS — F17200 Nicotine dependence, unspecified, uncomplicated: Secondary | ICD-10-CM

## 2013-10-08 DIAGNOSIS — I10 Essential (primary) hypertension: Secondary | ICD-10-CM

## 2013-10-08 NOTE — Patient Instructions (Signed)
Mr. Schools,   It was nice to see you today. Your blood pressure looks great. Please continue your blood pressure medication and consider smoking cessation when you are ready.   Come back in 4 weeks.   Merry Christmas,   Dr. Clinton Sawyer

## 2013-10-08 NOTE — Assessment & Plan Note (Signed)
A: persistent smoking P: no desire to quit

## 2013-10-08 NOTE — Progress Notes (Signed)
   Subjective:    Patient ID: Bryan Davies, male    DOB: 24-Aug-1951, 62 y.o.   MRN: [id number]  HPI  62 year old M with HTN, bipolar disorder and tobacco abuse disorder who presents for follow up.   Hypertension  Home BP monitoring: no  Office BP: BP Readings from Last 3 Encounters:  10/08/13 113/78  10/03/13 124/72  10/01/13 155/73    Prescribed meds: amlodipine   Hypertension ROS:  Taking medications as prescribed:Yes Chest pain: No Shortness of breath: No Swelling of extremities: No TIA symptoms: No Regular exercise: Yes Low Na+ diet: No Alcohol/tobacco/drug use: Yes tobacco 1-2 ppd   Smoking Pt continues smoking for stress relief; 1-2 ppd; denies interest in quitting at this time    Review of Systems Left knee and elbow pain stable    Objective:   Physical Exam BP 113/78  Pulse 97  Ht 5\' 7"  (1.702 m)  Wt 169 lb (76.658 kg)  BMI 26.46 kg/m2 Gen: fatigued appearing, non distressed  Pulm: CTA-B CV: RRR, no murmurs       Assessment & Plan:

## 2013-10-08 NOTE — Assessment & Plan Note (Signed)
A: stable P: cont amlodipine

## 2013-10-26 ENCOUNTER — Ambulatory Visit: Payer: Medicare Other | Admitting: Sports Medicine

## 2013-11-09 ENCOUNTER — Ambulatory Visit (INDEPENDENT_AMBULATORY_CARE_PROVIDER_SITE_OTHER): Payer: Medicare HMO | Admitting: Family Medicine

## 2013-11-09 ENCOUNTER — Encounter: Payer: Self-pay | Admitting: Family Medicine

## 2013-11-09 ENCOUNTER — Other Ambulatory Visit: Payer: Self-pay | Admitting: Family Medicine

## 2013-11-09 VITALS — BP 146/88 | HR 96 | Ht 67.0 in | Wt 175.0 lb

## 2013-11-09 DIAGNOSIS — I10 Essential (primary) hypertension: Secondary | ICD-10-CM

## 2013-11-09 DIAGNOSIS — M25519 Pain in unspecified shoulder: Secondary | ICD-10-CM

## 2013-11-09 DIAGNOSIS — M25512 Pain in left shoulder: Secondary | ICD-10-CM

## 2013-11-09 LAB — LIPID PANEL
Cholesterol: 192 mg/dL (ref 0–200)
HDL: 41 mg/dL (ref >39)
LDL Cholesterol: 84 mg/dL (ref 0–99)
Total CHOL/HDL Ratio: 4.7 Ratio
Triglycerides: 337 mg/dL — ABNORMAL HIGH (ref <150)
VLDL: 67 mg/dL — ABNORMAL HIGH (ref 0–40)

## 2013-11-09 NOTE — Assessment & Plan Note (Signed)
A: persistent pain with symptoms and signs of rotator cuff tendinopathy with impingement w/ poss labrum involvement P: Pt to return to sports medicine for ultrasound of left shoulder to see if rotator cuff damage and recommendations for treatment

## 2013-11-09 NOTE — Progress Notes (Signed)
   Subjective:    Patient ID: Bryan Davies, male    DOB: 1951-10-03, 63 y.o.   MRN: [id number]  HPI  63 year old M presenting for evaluation of chronic left shoulder pain.   Left Shoulder Pain - Chronic pain in left shoulder. Pt has been seen for this problem numerous times in the since May 2014. The pain is located in the lateral deltoid. The pain is exacerbated by movement, lifting, walking his dog, and sleeping on his left side. It is mildly relieved by meloxicam. He had sub-acromnial steroid injections in May and October 2014, both of which provided relief for 2 weeks. He was [facility] for the problem, but complained of knee pain while there. He has had no imaging of the shoulder performed.    HTN and Tobacco Abuse - pt at increased risk for CAD and CVA, have not checked lipids checked in several years and never been on cholesterol medication; no hx of MI or CVA   Review of Systems See HPI    Objective:   Physical Exam BP 146/88  Pulse 96  Ht 5\' 7"  (1.702 m)  Wt 175 lb (79.379 kg)  BMI 27.40 kg/m2 Gen: middle age 34M, non ill appearing, dissheveled, smell like tobacco smoke  Shoulder Exam - left Inspection: norma appearing, no swelling redness or atrophy Palpation:   Clavicle: non tender  AC Joint: non tender  Scapula: non tender             Biceps Tendon: non tender  ROM/Strength:  Abduction (Suprapsinatus): limited  Internal Rotation/Liftoff (Subsapularis):normal  External Rotation (Infraspinatus/Teres Minor): limited Maneuvers:   Neer's: limited by pain  Hawkin's: Positive  Empty Can/Jobes for supraspinatus: limited by pain, no weakness   Obrien's test for labrum tear: limited by pain       Assessment & Plan:

## 2013-11-09 NOTE — Assessment & Plan Note (Signed)
A: stable BP, increased risk of CVA and CAD P: check lipids, continue Norvasc

## 2013-11-09 NOTE — Patient Instructions (Signed)
Mr. Bryan Davies,   Thank you for coming to clinic today. Please read below regarding the issues that we discussed.   1. Left Shoulder - I think you have an injury to your left shoulder in the labrum or rotator cuff. Please go to Sports Medicine for a follow up.   2. Cholesterol - I will let you know the results of your cholesterol test.   3. Cigarette Smoking - The single best thing that you can do for yourself is to quit smoking. Let me know how I can help.   Please follow up in clinic in 2 months. Please call earlier if you have any questions or concerns.   Sincerely,   Dr. Clinton SawyerWilliamson

## 2013-11-10 ENCOUNTER — Telehealth: Payer: Self-pay | Admitting: Family Medicine

## 2013-11-10 DIAGNOSIS — E781 Pure hyperglyceridemia: Secondary | ICD-10-CM

## 2013-11-10 MED ORDER — ATORVASTATIN CALCIUM 40 MG PO TABS: 40.0000 mg | ORAL_TABLET | Freq: Every day | ORAL | Status: DC

## 2013-11-10 NOTE — Telephone Encounter (Signed)
Pt called and discussed results of lipid panel and need for statin to reduce CV disease risk. Pt agreeable. I will prescribe lipitor 40 mg po daily. Risks and benefits discussed. Pt without any more questions.

## 2013-12-08 ENCOUNTER — Other Ambulatory Visit: Payer: Self-pay | Admitting: Family Medicine

## 2013-12-09 ENCOUNTER — Other Ambulatory Visit: Payer: Self-pay | Admitting: Family Medicine

## 2014-01-05 ENCOUNTER — Other Ambulatory Visit: Payer: Self-pay | Admitting: Family Medicine

## 2014-01-05 DIAGNOSIS — B0229 Other postherpetic nervous system involvement: Secondary | ICD-10-CM

## 2014-02-02 ENCOUNTER — Ambulatory Visit: Payer: Medicare HMO | Admitting: Family Medicine

## 2014-02-08 ENCOUNTER — Other Ambulatory Visit: Payer: Self-pay | Admitting: Family Medicine

## 2014-02-11 ENCOUNTER — Ambulatory Visit (INDEPENDENT_AMBULATORY_CARE_PROVIDER_SITE_OTHER): Payer: Medicare HMO | Admitting: Family Medicine

## 2014-02-11 ENCOUNTER — Encounter: Payer: Self-pay | Admitting: Family Medicine

## 2014-02-11 ENCOUNTER — Other Ambulatory Visit: Payer: Self-pay | Admitting: Family Medicine

## 2014-02-11 VITALS — BP 160/84 | HR 110 | Temp 97.9°F | Ht 67.0 in | Wt 172.6 lb

## 2014-02-11 DIAGNOSIS — E785 Hyperlipidemia, unspecified: Secondary | ICD-10-CM

## 2014-02-11 DIAGNOSIS — M549 Dorsalgia, unspecified: Secondary | ICD-10-CM

## 2014-02-11 LAB — POCT URINALYSIS DIPSTICK
BILIRUBIN UA: NEGATIVE
Glucose, UA: NEGATIVE
Ketones, UA: NEGATIVE
NITRITE UA: NEGATIVE
PH UA: 6
PROTEIN UA: NEGATIVE
Spec Grav, UA: 1.02
Urobilinogen, UA: 0.2

## 2014-02-11 LAB — POCT UA - MICROSCOPIC ONLY

## 2014-02-11 LAB — COMPREHENSIVE METABOLIC PANEL
ALT: 11 U/L (ref 0–53)
AST: 11 U/L (ref 0–37)
Albumin: 3.9 g/dL (ref 3.5–5.2)
Alkaline Phosphatase: 95 U/L (ref 39–117)
BUN: 15 mg/dL (ref 6–23)
CO2: 27 meq/L (ref 19–32)
Calcium: 8.9 mg/dL (ref 8.4–10.5)
Chloride: 106 mEq/L (ref 96–112)
Creat: 0.79 mg/dL (ref 0.50–1.35)
GLUCOSE: 95 mg/dL (ref 70–99)
POTASSIUM: 3.7 meq/L (ref 3.5–5.3)
Sodium: 142 mEq/L (ref 135–145)
Total Bilirubin: 0.2 mg/dL (ref 0.2–1.2)
Total Protein: 6.9 g/dL (ref 6.0–8.3)

## 2014-02-11 LAB — RHEUMATOID FACTOR

## 2014-02-11 MED ORDER — MELOXICAM 15 MG PO TABS: 15.0000 mg | ORAL_TABLET | Freq: Every day | ORAL | Status: DC | PRN

## 2014-02-11 NOTE — Assessment & Plan Note (Signed)
A: origin most likely MSK since patient very stiff and tender in paraspinal muscle, unlikely to be renal and no signs of infection, will check BMP and CBC; this is part of continued MSK pain and pt with a history of normal CK and ESR but will check more labs P:  - check urinalysis and CBC - given rx for NSAID - stretch and exercise encouraged

## 2014-02-11 NOTE — Progress Notes (Signed)
   Subjective:    Patient ID: Bryan Davies, male    DOB: Feb 16, 1951, 63 y.o.   MRN: [id number]  HPI  63 year old M with chronic MSK pain who presents for evaluation of back pain.    Back Pain: Location: lower back bilaterally, patient thinks that the pain is originating from his kidneys Although he has no history of kidney problems including stones or infections  Duration: 2 months Quality: 8/10 Current Functional Status:  ADL's - within normal limits Preceding Events: no falls. no trauma Alleviating Factors: rest Exacerbating Factors: going from seated to standing position and drinking coffee Hx of intervention: no surgery, no PT Hx of imaging: no Red Flags: no weakness, no numbness and tingling, no impaired bowel or bladder function   Hyperlipidemia: Pt with increased risk of CVD so started on atorvastatin 40 mg daily in January. No follow up since. He reports no problems with tolerance or comliance with the medication.   Current Outpatient Prescriptions on File Prior to Visit  Medication Sig Dispense Refill  . amLODipine (NORVASC) 5 MG tablet Take 1 tablet (5 mg total) by mouth daily.  30 tablet  11  . atorvastatin (LIPITOR) 40 MG tablet TAKE 1 TABLET BY MOUTH EVERY DAY  90 tablet  0  . gabapentin (NEURONTIN) 300 MG capsule TAKE 2 CAPSULES BY MOUTH THREE TIMES DAILY  180 capsule  5  . HYDROcodone-acetaminophen (NORCO/VICODIN) 5-325 MG per tablet Take 1-2 tablets every 6 hours as needed for severe pain  8 tablet  0  . meloxicam (MOBIC) 15 MG tablet TAKE 1 TABLET BY MOUTH DAILY  30 tablet  0  . nortriptyline (PAMELOR) 50 MG capsule       . valproic acid (DEPAKENE) 250 MG capsule Take 750 mg by mouth 2 (two) times daily.        No current facility-administered medications on file prior to visit.     Review of Systems See HPI     Objective:   Physical Exam BP 160/84  Pulse 110  Temp(Src) 97.9 F (36.6 C) (Oral)  Ht 5\' 7"  (1.702 m)  Wt 172 lb 9.6 oz (78.291 kg)  BMI  27.03 kg/m2  Gen: middle aged WM, well appearing, and pleasant Back:  Appearance: sciolosis no Palpation: tenderness of paraspinal muscles yes, spinous process no; pelvis no  Flexion: severely limited forward flexion of the lumbar and thoracic spine Extension: normal CVA tenderness: no  Neuro: Strength hip flexion 5/5, hip abduction 5/5, hip adduction 5/5,  knee extension 5/5, knee flexion 5/5, dorsiflexion 5/5, plantar flexion 5/5 bilaterally Reflexes: patella 2/2 Bilateral   Straight Leg Raise: negative Sensation to light touch intact: yes      Assessment & Plan:

## 2014-02-11 NOTE — Assessment & Plan Note (Signed)
Pt started on atorvastatin 40 mg 3 months ago, check LFT's today, I doubt that the medicine is causing his MSK pain since that has been presents for several years

## 2014-02-11 NOTE — Patient Instructions (Signed)
Mr. Bryan Davies,   Great to see you. We need to check some labs today to make sure your kidneys are okay. For now, you should stretch your back every day. Please read the exercises that I have listed below. This is probably the cause of your pain.   Please come back for follow up in 1-2 weeks.   Sincerely,   Dr. Clinton SawyerWilliamson  Back Exercises Back exercises help treat and prevent back injuries. The goal of back exercises is to increase the strength of your abdominal and back muscles and the flexibility of your back. These exercises should be started when you no longer have back pain. Back exercises include:  Pelvic Tilt. Lie on your back with your knees bent. Tilt your pelvis until the lower part of your back is against the floor. Hold this position 5 to 10 sec and repeat 5 to 10 times.  Knee to Chest. Pull first 1 knee up against your chest and hold for 20 to 30 seconds, repeat this with the other knee, and then both knees. This may be done with the other leg straight or bent, whichever feels better.  Sit-Ups or Curl-Ups. Bend your knees 90 degrees. Start with tilting your pelvis, and do a partial, slow sit-up, lifting your trunk only 30 to 45 degrees off the floor. Take at least 2 to 3 seconds for each sit-up. Do not do sit-ups with your knees out straight. If partial sit-ups are difficult, simply do the above but with only tightening your abdominal muscles and holding it as directed.  Hip-Lift. Lie on your back with your knees flexed 90 degrees. Push down with your feet and shoulders as you raise your hips a couple inches off the floor; hold for 10 seconds, repeat 5 to 10 times.  Back arches. Lie on your stomach, propping yourself up on bent elbows. Slowly press on your hands, causing an arch in your low back. Repeat 3 to 5 times. Any initial stiffness and discomfort should lessen with repetition over time.  Shoulder-Lifts. Lie face down with arms beside your body. Keep hips and torso pressed to  floor as you slowly lift your head and shoulders off the floor. Do not overdo your exercises, especially in the beginning. Exercises may cause you some mild back discomfort which lasts for a few minutes; however, if the pain is more severe, or lasts for more than 15 minutes, do not continue exercises until you see your caregiver. Improvement with exercise therapy for back problems is slow.  See your caregivers for assistance with developing a proper back exercise program. Document Released: 11/22/2004 Document Revised: 01/07/2012 Document Reviewed: 08/16/2011 [facility] Patient Information 2014 [place].

## 2014-02-12 LAB — ANA: Anti Nuclear Antibody(ANA): NEGATIVE

## 2014-02-25 ENCOUNTER — Encounter: Payer: Self-pay | Admitting: Family Medicine

## 2014-02-25 ENCOUNTER — Ambulatory Visit (INDEPENDENT_AMBULATORY_CARE_PROVIDER_SITE_OTHER): Payer: Medicare HMO | Admitting: Family Medicine

## 2014-02-25 VITALS — BP 128/80 | HR 100 | Ht 67.0 in | Wt 170.0 lb

## 2014-02-25 DIAGNOSIS — M549 Dorsalgia, unspecified: Secondary | ICD-10-CM

## 2014-02-25 DIAGNOSIS — S90129A Contusion of unspecified lesser toe(s) without damage to nail, initial encounter: Secondary | ICD-10-CM

## 2014-02-25 DIAGNOSIS — S90122A Contusion of left lesser toe(s) without damage to nail, initial encounter: Secondary | ICD-10-CM

## 2014-02-25 NOTE — Assessment & Plan Note (Signed)
A: improved, has trigger point P: encouraged continued stretching and exercise

## 2014-02-25 NOTE — Progress Notes (Signed)
   Subjective:    Patient ID: Annye EnglishRoger D Montesano, male    DOB: August 10, 1951, 63 y.o.   MRN: [id number]  HPI  F/u back pain - Seen two weeks ago and he had concern that he had a kidney issue causing lower back pain. My exam was most consistent with lumbago due to muscle tightness. A urinalysis was snot concerning for infection and creatinine was normal. I also check his ANA and RF b/c he comes in with joint and muscle pain at every visit. Sed rate was normal in 2014. RF normal and ANA neg. Pt has not been exercising. He has been taking NSAID with relief. No weakness of LE o saddle anesthesia.   Toe pain - Patient stubbed his fourth toe of the left foot on a piece of furniture. 2 days ago. Has experience increased bruising and swelling on "top" of the toe. It is painful.    Review of Systems Se HPI    Objective:   Physical Exam BP 128/80  Pulse 100  Ht 5\' 7"  (1.702 m)  Wt 170 lb (77.111 kg)  BMI 26.62 kg/m2  Gen: middle aged male, non ill appearing LLE: marked echymosis of distal 4th digit with TTP, active ROM normal and no obvious bony deformity Back: TTP of thoracic paraspinal muscle on right side      Assessment & Plan:

## 2014-02-25 NOTE — Assessment & Plan Note (Signed)
A: obvious contusion with ecchymosis, possible fracture, but non displaced so no need for X-ray P: - Given digital nerve block with lidocaine - Buddy splinted

## 2014-03-19 ENCOUNTER — Ambulatory Visit: Payer: Medicare HMO | Admitting: Family Medicine

## 2014-04-07 ENCOUNTER — Ambulatory Visit: Payer: Medicare HMO | Admitting: Family Medicine

## 2014-04-27 ENCOUNTER — Other Ambulatory Visit: Payer: Self-pay | Admitting: Family Medicine

## 2014-05-03 ENCOUNTER — Ambulatory Visit: Payer: Medicare HMO | Admitting: Family Medicine

## 2014-06-29 ENCOUNTER — Other Ambulatory Visit: Payer: Self-pay | Admitting: *Deleted

## 2014-07-13 ENCOUNTER — Other Ambulatory Visit: Payer: Self-pay | Admitting: *Deleted

## 2014-07-14 ENCOUNTER — Other Ambulatory Visit: Payer: Self-pay | Admitting: *Deleted

## 2014-07-14 MED ORDER — MELOXICAM 15 MG PO TABS: ORAL_TABLET | ORAL | Status: DC

## 2014-11-25 ENCOUNTER — Encounter (HOSPITAL_COMMUNITY): Payer: Self-pay

## 2014-11-25 ENCOUNTER — Emergency Department (HOSPITAL_COMMUNITY): Payer: Commercial Managed Care - HMO

## 2014-11-25 ENCOUNTER — Emergency Department (HOSPITAL_COMMUNITY)
Admission: EM | Admit: 2014-11-25 | Discharge: 2014-11-25 | Disposition: A | Discharge status: Home / Self Care | Payer: Commercial Managed Care - HMO | Attending: Emergency Medicine | Admitting: Emergency Medicine

## 2014-11-25 DIAGNOSIS — R0781 Pleurodynia: Secondary | ICD-10-CM | POA: Diagnosis not present

## 2014-11-25 DIAGNOSIS — R079 Chest pain, unspecified: Secondary | ICD-10-CM | POA: Insufficient documentation

## 2014-11-25 DIAGNOSIS — F419 Anxiety disorder, unspecified: Secondary | ICD-10-CM | POA: Insufficient documentation

## 2014-11-25 DIAGNOSIS — R509 Fever, unspecified: Secondary | ICD-10-CM | POA: Diagnosis present

## 2014-11-25 DIAGNOSIS — Z79899 Other long term (current) drug therapy: Secondary | ICD-10-CM | POA: Diagnosis not present

## 2014-11-25 DIAGNOSIS — Z8619 Personal history of other infectious and parasitic diseases: Secondary | ICD-10-CM | POA: Diagnosis not present

## 2014-11-25 DIAGNOSIS — Z791 Long term (current) use of non-steroidal anti-inflammatories (NSAID): Secondary | ICD-10-CM | POA: Diagnosis not present

## 2014-11-25 DIAGNOSIS — I10 Essential (primary) hypertension: Secondary | ICD-10-CM | POA: Insufficient documentation

## 2014-11-25 DIAGNOSIS — Z720 Tobacco use: Secondary | ICD-10-CM | POA: Diagnosis not present

## 2014-11-25 MED ORDER — CYCLOBENZAPRINE HCL 10 MG PO TABS: 10.0000 mg | ORAL_TABLET | Freq: Two times a day (BID) | ORAL | Status: DC | PRN

## 2014-11-25 NOTE — ED Provider Notes (Signed)
CSN: [id number]     Arrival date & time 11/25/14  2205 History  This chart was scribed for Bryan Horsemanobert Everli Rother, PA-C working with Richardean Canalavid H Yao, MD by Evon Slackerrance Branch, ED Scribe. This patient was seen in room TR08C/TR08C and the patient's care was started at 10:28 PM.      Chief Complaint  Patient presents with  . Fever  . Medication Refill   Patient is a 64 y.o. male presenting with fever. The history is provided by the patient. No language interpreter was used.  Fever Associated symptoms: chest pain (right sided rib pain)   Associated symptoms: no chills, no diarrhea, no dysuria, no nausea and no vomiting    HPI Comments: Annye EnglishRoger D Colburn is a 64 y.o. male who presents to the Emergency Department complaining of "knot" in the center of his chest. Pt states that he also is having some right sided rib cage pain onset 1 year prior. Pt states that he decided to come in today because he got tired of hurting. Pt states that he did vomit 1 week ago but denies any today. Pt nausea, fevers, constipation or diarrhea.  Pt states that he has a Hx of shingles.    Past Medical History  Diagnosis Date  . Anxiety   . Shingles 2013  . Hypertension   . Shoulder pain    History reviewed. No pertinent past surgical history. History reviewed. No pertinent family history. History  Substance Use Topics  . Smoking status: Current Every Day Smoker -- 0.50 packs/day for 50 years    Types: Cigarettes  . Smokeless tobacco: Not on file     Comment: 10  cigarettes per day, waits an hour between  . Alcohol Use: No    Review of Systems  Constitutional: Negative for fever and chills.  Respiratory: Negative for shortness of breath.   Cardiovascular: Positive for chest pain (right sided rib pain).  Gastrointestinal: Negative for nausea, vomiting, diarrhea and constipation.  Genitourinary: Negative for dysuria.      Allergies  Review of patient's allergies indicates no known allergies.  Home Medications    Prior to Admission medications   Medication Sig Start Date End Date Taking? Authorizing Provider  amLODipine (NORVASC) 5 MG tablet Take 1 tablet (5 mg total) by mouth daily. 06/01/13   Garnetta BuddyEdward Williamson V, MD  atorvastatin (LIPITOR) 40 MG tablet TAKE 1 TABLET BY MOUTH EVERY DAY 02/08/14   Lonia SkinnerStephanie E Losq, MD  gabapentin (NEURONTIN) 300 MG capsule TAKE 2 CAPSULES BY MOUTH THREE TIMES DAILY 01/05/14   Garnetta BuddyEdward Williamson V, MD  meloxicam (MOBIC) 15 MG tablet TAKE 1 TABLET BY MOUTH DAILY AS NEEDED FOR PAIN 07/14/14   Myra RudeJeremy E Schmitz, MD  nortriptyline (PAMELOR) 50 MG capsule  11/08/13   Historical Provider, MD  valproic acid (DEPAKENE) 250 MG capsule Take 750 mg by mouth 2 (two) times daily.     Historical Provider, MD   BP 167/83 mmHg  Pulse 103  Temp(Src) 98 F (36.7 C) (Oral)  Resp 18  SpO2 100%   Physical Exam  Constitutional: He is oriented to person, place, and time. He appears well-developed and well-nourished. No distress.  HENT:  Head: Normocephalic and atraumatic.  Eyes: Conjunctivae and EOM are normal.  Neck: Neck supple. No tracheal deviation present.  Cardiovascular: Normal rate, regular rhythm, normal heart sounds and intact distal pulses.  Exam reveals no gallop and no friction rub.   No murmur heard. Pulmonary/Chest: Effort normal and breath sounds normal. No respiratory distress.  He has no wheezes. He has no rales. He exhibits tenderness.  Small nodule on left inferior sternum, moderate inferior rib pain.  Abdominal: Soft. He exhibits no distension and no mass. There is no tenderness. There is no rebound and no guarding.  No focal abdominal tenderness.   Musculoskeletal: Normal range of motion.  Neurological: He is alert and oriented to person, place, and time.  Skin: Skin is warm and dry.  No evidence of rash or shingles  Psychiatric: He has a normal mood and affect. His behavior is normal.  Nursing note and vitals reviewed.   ED Course  Procedures (including  critical care time) DIAGNOSTIC STUDIES: Oxygen Saturation is 100% on RA, normal by my interpretation.    COORDINATION OF CARE: 11:00 PM-Discussed treatment plan with pt at bedside and pt agreed to plan.     Labs Review Labs Reviewed - No data to display  Imaging Review Dg Chest 2 View  11/25/2014   CLINICAL DATA:  Right-sided rib pain. Firm nodule on the sternum. History of shingles, COPD, hypertension. Smoker.  EXAM: CHEST  2 VIEW  COMPARISON:  07/02/2013  FINDINGS: Central interstitial changes with peribronchial thickening suggesting chronic bronchitis. Normal heart size and pulmonary vascularity. No focal airspace disease or consolidation in the lungs. No blunting of costophrenic angles. No pneumothorax. Mediastinal contours appear intact. Degenerative changes in the spine. Visualized portions of the ribs are nondisplaced.  IMPRESSION: Chronic bronchitic changes in the lungs. No evidence of active pulmonary disease.   Electronically Signed   By: Burman Nieves M.D.   On: 11/25/2014 23:19     EKG Interpretation None      MDM   Final diagnoses:  Rib pain on right side   Patient with right rib pain 1 year. Patient also reports having a fever at home, but he is afebrile here, and vital signs are stable. Also complains of a left sternum nodule that has been present for an unknown amount of time. No acute findings on chest x-ray. Vital signs are stable. Patient is well-appearing. He is not in any apparent distress. Will discharge to home with primary care follow-up.   I personally performed the services described in this documentation, which was scribed in my presence. The recorded information has been reviewed and is accurate.      Bryan Horseman, PA-C 11/26/14 0003  Richardean Canal, MD 11/26/14 [phone]

## 2014-11-25 NOTE — Discharge Instructions (Signed)

## 2014-11-25 NOTE — ED Notes (Signed)
Pt from home with reported fever; sts his forehead feels hot to touch.  Pt also reporting knot to chest.  Has not taken his BP medicine in 7 months.

## 2014-12-09 ENCOUNTER — Telehealth: Payer: Self-pay | Admitting: Family Medicine

## 2014-12-09 DIAGNOSIS — I10 Essential (primary) hypertension: Secondary | ICD-10-CM

## 2014-12-09 DIAGNOSIS — B0229 Other postherpetic nervous system involvement: Secondary | ICD-10-CM

## 2014-12-09 MED ORDER — GABAPENTIN 300 MG PO CAPS: 600.0000 mg | ORAL_CAPSULE | Freq: Three times a day (TID) | ORAL | Status: DC

## 2014-12-09 MED ORDER — AMLODIPINE BESYLATE 5 MG PO TABS: 5.0000 mg | ORAL_TABLET | Freq: Every day | ORAL | Status: DC

## 2014-12-09 NOTE — Telephone Encounter (Signed)
Patient called requesting back brace for ongoing shoulder pain. I called patient and informed him that he would need to make an appointment in order for me to evaluate him.   He cited issues with transportation so I refilled his amlodipine and gabapentin. He will try to make an appointment with me by March 3.   Myra RudeJeremy E Jadier Rockers, MD PGY-2, [facility] 12/09/2014, 11:16 AM

## 2014-12-09 NOTE — Telephone Encounter (Signed)
Pt called and would like a prescription for a back brace. He has already talked to his pharmacy and they have approved this. Please call him on his cell phone [phone] with any questions and also how will will receive this. jw

## 2015-01-25 ENCOUNTER — Emergency Department (HOSPITAL_COMMUNITY)
Admission: EM | Admit: 2015-01-25 | Discharge: 2015-01-26 | Disposition: A | Discharge status: Home / Self Care | Payer: Medicare HMO | Attending: Emergency Medicine | Admitting: Emergency Medicine

## 2015-01-25 ENCOUNTER — Encounter (HOSPITAL_COMMUNITY): Payer: Self-pay | Admitting: *Deleted

## 2015-01-25 DIAGNOSIS — R079 Chest pain, unspecified: Secondary | ICD-10-CM

## 2015-01-25 DIAGNOSIS — R0789 Other chest pain: Secondary | ICD-10-CM | POA: Insufficient documentation

## 2015-01-25 DIAGNOSIS — Z720 Tobacco use: Secondary | ICD-10-CM | POA: Diagnosis not present

## 2015-01-25 DIAGNOSIS — Z79899 Other long term (current) drug therapy: Secondary | ICD-10-CM | POA: Diagnosis not present

## 2015-01-25 DIAGNOSIS — R51 Headache: Secondary | ICD-10-CM | POA: Diagnosis not present

## 2015-01-25 DIAGNOSIS — I10 Essential (primary) hypertension: Secondary | ICD-10-CM | POA: Diagnosis not present

## 2015-01-25 DIAGNOSIS — R42 Dizziness and giddiness: Secondary | ICD-10-CM | POA: Diagnosis not present

## 2015-01-25 DIAGNOSIS — Z8619 Personal history of other infectious and parasitic diseases: Secondary | ICD-10-CM | POA: Diagnosis not present

## 2015-01-25 DIAGNOSIS — R519 Headache, unspecified: Secondary | ICD-10-CM

## 2015-01-25 LAB — COMPREHENSIVE METABOLIC PANEL
ALT: 19 U/L (ref 0–53)
AST: 20 U/L (ref 0–37)
Albumin: 3.7 g/dL (ref 3.5–5.2)
Alkaline Phosphatase: 94 U/L (ref 39–117)
Anion gap: 12 (ref 5–15)
BUN: 12 mg/dL (ref 6–23)
CO2: 23 mmol/L (ref 19–32)
Calcium: 9.1 mg/dL (ref 8.4–10.5)
Chloride: 102 mmol/L (ref 96–112)
Creatinine, Ser: 1.04 mg/dL (ref 0.50–1.35)
GFR calc Af Amer: 87 mL/min — ABNORMAL LOW (ref >90)
GFR calc non Af Amer: 75 mL/min — ABNORMAL LOW (ref >90)
Glucose, Bld: 121 mg/dL — ABNORMAL HIGH (ref 70–99)
Potassium: 4 mmol/L (ref 3.5–5.1)
SODIUM: 137 mmol/L (ref 135–145)
Total Bilirubin: 0.3 mg/dL (ref 0.3–1.2)
Total Protein: 7 g/dL (ref 6.0–8.3)

## 2015-01-25 LAB — CBC WITH DIFFERENTIAL/PLATELET
BASOS PCT: 1 % (ref 0–1)
Basophils Absolute: 0 10*3/uL (ref 0.0–0.1)
Eosinophils Absolute: 0.1 10*3/uL (ref 0.0–0.7)
Eosinophils Relative: 1 % (ref 0–5)
HCT: 45.8 % (ref 39.0–52.0)
Hemoglobin: 15.9 g/dL (ref 13.0–17.0)
Lymphocytes Relative: 29 % (ref 12–46)
Lymphs Abs: 2.2 10*3/uL (ref 0.7–4.0)
MCH: 30.6 pg (ref 26.0–34.0)
MCHC: 34.7 g/dL (ref 30.0–36.0)
MCV: 88.1 fL (ref 78.0–100.0)
MONO ABS: 0.7 10*3/uL (ref 0.1–1.0)
Monocytes Relative: 9 % (ref 3–12)
Neutro Abs: 4.4 10*3/uL (ref 1.7–7.7)
Neutrophils Relative %: 60 % (ref 43–77)
Platelets: 216 10*3/uL (ref 150–400)
RBC: 5.2 MIL/uL (ref 4.22–5.81)
RDW: 13.2 % (ref 11.5–15.5)
WBC: 7.4 10*3/uL (ref 4.0–10.5)

## 2015-01-25 LAB — CBG MONITORING, ED: Glucose-Capillary: 117 mg/dL — ABNORMAL HIGH (ref 70–99)

## 2015-01-25 LAB — TROPONIN I

## 2015-01-25 NOTE — ED Notes (Signed)
The pt ate 2 cream filled doughnuts one hour ago in 15 minutes he was dizzy and developed a headache.  niot a diabetic.  He still has the headache and dizziness

## 2015-01-26 ENCOUNTER — Emergency Department (HOSPITAL_COMMUNITY): Payer: Medicare HMO

## 2015-01-26 MED ORDER — SODIUM CHLORIDE 0.9 % IV BOLUS (SEPSIS)
1000.0000 mL | INTRAVENOUS | Status: AC
  Administered 2015-01-26: 1000 mL via INTRAVENOUS

## 2015-01-26 MED ORDER — DIPHENHYDRAMINE HCL 50 MG/ML IJ SOLN
25.0000 mg | Freq: Once | INTRAMUSCULAR | Status: AC
  Administered 2015-01-26: 25 mg via INTRAVENOUS
  Filled 2015-01-26: qty 1

## 2015-01-26 MED ORDER — ACETAMINOPHEN 325 MG PO TABS
650.0000 mg | ORAL_TABLET | Freq: Once | ORAL | Status: AC
  Administered 2015-01-26: 650 mg via ORAL
  Filled 2015-01-26: qty 2

## 2015-01-26 MED ORDER — METOCLOPRAMIDE HCL 5 MG/ML IJ SOLN
5.0000 mg | Freq: Once | INTRAMUSCULAR | Status: AC
  Administered 2015-01-26: 5 mg via INTRAVENOUS
  Filled 2015-01-26: qty 2

## 2015-01-26 NOTE — ED Notes (Signed)
XRAY called and informed pt is ready for his xray.

## 2015-01-26 NOTE — ED Provider Notes (Signed)
CSN: [id number]     Arrival date & time 01/25/15  2138 History  .This chart was scribed for Purvis Sheffield, MD by Bronson Curb, ED Scribe. This patient was seen in room D32C/D32C and the patient's care was started at 12:20 AM.   Chief Complaint  Patient presents with  . Dizziness    Patient is a 64 y.o. male presenting with headaches. The history is provided by the patient. No language interpreter was used.  Headache Pain location:  Frontal Quality: "swimming" Severity at highest:  9/10 Onset quality:  Sudden Duration:  4 hours Timing:  Constant Progression:  Unchanged Chronicity:  New Context: eating   Relieved by:  None tried Ineffective treatments:  None tried Associated symptoms: dizziness   Associated symptoms: no abdominal pain, no back pain, no cough, no diarrhea, no fever, no nausea, no neck pain, no sore throat and no vomiting      HPI Comments: Bryan Davies is a 64 y.o. male, with history of anxiety, shingles, and HTN, who presents to the Emergency Department complaining of sudden onset 9/10 "swimming" frontal headache that began approximately 4 hours ago after eating 2 doughnuts. He denies any recent head injury or fever. No treatments tried PTA. Patient is also complaining of stabbing, left chest pain that began with onset headache. He is currently having chest pain and headache at this time with associated dizziness. He denies history of headaches.   Patient is also complaining of chronic pain moderate-severe pain to his right side, and reports he is taking gabapentin without relief. He reports recent history of shingles in the same area.    Past Medical History  Diagnosis Date  . Anxiety   . Shingles 2013  . Hypertension   . Shoulder pain    History reviewed. No pertinent past surgical history. No family history on file. History  Substance Use Topics  . Smoking status: Current Every Day Smoker -- 0.50 packs/day for 50 years    Types: Cigarettes  .  Smokeless tobacco: Not on file     Comment: 10  cigarettes per day, waits an hour between  . Alcohol Use: No    Review of Systems  Constitutional: Negative for fever and chills.  HENT: Negative for rhinorrhea and sore throat.   Eyes: Negative for visual disturbance.  Respiratory: Negative for cough and shortness of breath.   Cardiovascular: Positive for chest pain. Negative for leg swelling.  Gastrointestinal: Negative for nausea, vomiting, abdominal pain and diarrhea.  Genitourinary: Negative for dysuria.  Musculoskeletal: Negative for back pain and neck pain.  Skin: Negative for rash.  Neurological: Positive for dizziness and headaches. Negative for light-headedness.  Hematological: Does not bruise/bleed easily.  Psychiatric/Behavioral: Negative for confusion.      Allergies  Review of patient's allergies indicates no known allergies.  Home Medications   Prior to Admission medications   Medication Sig Start Date End Date Taking? Authorizing Provider  amLODipine (NORVASC) 5 MG tablet Take 1 tablet (5 mg total) by mouth daily. 12/09/14  Yes Myra Rude, MD  gabapentin (NEURONTIN) 300 MG capsule Take 2 capsules (600 mg total) by mouth 3 (three) times daily. 12/09/14  Yes Myra Rude, MD  atorvastatin (LIPITOR) 40 MG tablet TAKE 1 TABLET BY MOUTH EVERY DAY Patient not taking: Reported on 01/25/2015 02/08/14   Lonia Skinner, MD  cyclobenzaprine (FLEXERIL) 10 MG tablet Take 1 tablet (10 mg total) by mouth 2 (two) times daily as needed for muscle spasms. Patient not taking:  Reported on 01/25/2015 11/25/14   Roxy Horsemanobert Browning, PA-C  meloxicam (MOBIC) 15 MG tablet TAKE 1 TABLET BY MOUTH DAILY AS NEEDED FOR PAIN Patient not taking: Reported on 01/25/2015 07/14/14   Myra RudeJeremy E Schmitz, MD   Triage Vitals: BP 125/75 mmHg  Pulse 85  Temp(Src) 98.2 F (36.8 C) (Oral)  Resp 23  Ht 5' 7.75" (1.721 m)  Wt 180 lb (81.647 kg)  BMI 27.57 kg/m2  SpO2 95%  Physical Exam  Constitutional:  He is oriented to person, place, and time. He appears well-developed and well-nourished. No distress.  HENT:  Head: Normocephalic and atraumatic.  Right Ear: Hearing normal.  Left Ear: Hearing normal.  Nose: Nose normal.  Mouth/Throat: Oropharynx is clear and moist and mucous membranes are normal.  Eyes: Conjunctivae and EOM are normal. Pupils are equal, round, and reactive to light.  Neck: Normal range of motion. Neck supple.  Cardiovascular: Regular rhythm, S1 normal and S2 normal.  Exam reveals no gallop and no friction rub.   No murmur heard. Pulmonary/Chest: Effort normal and breath sounds normal. No respiratory distress. He exhibits tenderness.  Focal tenderness in the left upper chest wall.  Evidence of healing shingles in the right lower anterior chest.  Abdominal: Soft. Normal appearance and bowel sounds are normal. There is no hepatosplenomegaly. There is no tenderness. There is no rebound, no guarding, no tenderness at McBurney's point and negative Murphy's sign. No hernia.  Musculoskeletal: Normal range of motion.  Neurological: He is alert and oriented to person, place, and time. He has normal strength. No cranial nerve deficit or sensory deficit. Coordination normal. GCS eye subscore is 4. GCS verbal subscore is 5. GCS motor subscore is 6.  alert, oriented x3 speech: normal in context and clarity memory: intact grossly cranial nerves II-XII: intact motor strength: full proximally and distally no involuntary movements or tremors sensation: intact to light touch diffusely  cerebellar: finger-to-nose and heel-to-shin intact gait: normal forwards and backwards  Skin: Skin is warm, dry and intact. No rash noted. No cyanosis.  Psychiatric: He has a normal mood and affect. His speech is normal and behavior is normal. Thought content normal.  Nursing note and vitals reviewed.   ED Course  Procedures (including critical care time)  DIAGNOSTIC STUDIES: Oxygen Saturation is 95%  on room air, adequate by my interpretation.    COORDINATION OF CARE: At 0028 Discussed treatment plan with patient which includes migraine cocktail. Patient agrees.   Labs Review Labs Reviewed  COMPREHENSIVE METABOLIC PANEL - Abnormal; Notable for the following:    Glucose, Bld 121 (*)    GFR calc non Af Amer 75 (*)    GFR calc Af Amer 87 (*)    All other components within normal limits  CBG MONITORING, ED - Abnormal; Notable for the following:    Glucose-Capillary 117 (*)    All other components within normal limits  CBC WITH DIFFERENTIAL/PLATELET  TROPONIN I    Imaging Review Dg Chest 2 View  01/26/2015   CLINICAL DATA:  Right and left lower chest pain for 1 night. Shortness of breath. Smoking history.  EXAM: CHEST  2 VIEW  COMPARISON:  11/25/2014  FINDINGS: Lungs are hyperinflated with mild central bronchial thickening and coarse interstitial markings, unchanged. The cardiomediastinal contours are normal. The lungs are clear. Pulmonary vasculature is normal. No consolidation, pleural effusion, or pneumothorax. No acute osseous abnormalities are seen.  IMPRESSION: No acute pulmonary process.  Stable chronic change.   Electronically Signed   By: Shawna OrleansMelanie  Ehinger M.D.   On: 01/26/2015 01:26     EKG Interpretation   Date/Time:  Tuesday January 25 2015 21:47:59 EDT Ventricular Rate:  102 PR Interval:  132 QRS Duration: 82 QT Interval:  340 QTC Calculation: 443 R Axis:   57 Text Interpretation:  Sinus tachycardia Possible Left atrial enlargement  Borderline ECG No significant change since last tracing Confirmed by  Lysa Livengood  MD, [place] ([id number]) on 01/26/2015 12:23:23 AM        MDM   Final diagnoses:  Chest pain  Headache, unspecified headache type    3:32 AM 64 y.o. male w hx of HTN who presents with headache and chest pain. Chest pain is atypical in that it is in his left upper chest and right lateral chest. CP reproducible w/ palpation. He has had similar symptoms  previously. Headache is frontal and began around 8 PM. He has a normal neurologic exam. Vital signs unremarkable here. Screening labs and imaging thus far noncontributory. Will treat headache with migraine cocktail.  3:32 AM: HA resolved, pt cont to appear well. CP atypical. Neg workup. I have discussed the diagnosis/risks/treatment options with the patient and believe the pt to be eligible for discharge home to follow-up with his pcp as needed. We also discussed returning to the ED immediately if new or worsening sx occur. We discussed the sx which are most concerning (e.g., worsening HA, fever, worsening cp) that necessitate immediate return. Medications administered to the patient during their visit and any new prescriptions provided to the patient are listed below.  Medications given during this visit Medications  sodium chloride 0.9 % bolus 1,000 mL (0 mLs Intravenous Stopped 01/26/15 0217)  metoCLOPramide (REGLAN) injection 5 mg (5 mg Intravenous Given 01/26/15 0105)  diphenhydrAMINE (BENADRYL) injection 25 mg (25 mg Intravenous Given 01/26/15 0104)  acetaminophen (TYLENOL) tablet 650 mg (650 mg Oral Given 01/26/15 0031)    New Prescriptions   No medications on file       I personally performed the services described in this documentation, which was scribed in my presence. The recorded information has been reviewed and is accurate.    Purvis Sheffield, MD 01/26/15 [phone]

## 2015-02-08 ENCOUNTER — Ambulatory Visit: Payer: Commercial Managed Care - HMO | Admitting: Family Medicine

## 2015-02-21 ENCOUNTER — Ambulatory Visit: Payer: Commercial Managed Care - HMO | Admitting: Family Medicine

## 2015-05-19 ENCOUNTER — Encounter (HOSPITAL_COMMUNITY): Payer: Self-pay | Admitting: Emergency Medicine

## 2015-05-19 ENCOUNTER — Emergency Department (HOSPITAL_COMMUNITY)
Admission: EM | Admit: 2015-05-19 | Discharge: 2015-05-19 | Disposition: A | Discharge status: Home / Self Care | Payer: Medicare HMO | Attending: Emergency Medicine | Admitting: Emergency Medicine

## 2015-05-19 ENCOUNTER — Emergency Department (HOSPITAL_COMMUNITY): Payer: Medicare HMO

## 2015-05-19 DIAGNOSIS — Z8619 Personal history of other infectious and parasitic diseases: Secondary | ICD-10-CM | POA: Diagnosis not present

## 2015-05-19 DIAGNOSIS — Z8659 Personal history of other mental and behavioral disorders: Secondary | ICD-10-CM | POA: Insufficient documentation

## 2015-05-19 DIAGNOSIS — G43909 Migraine, unspecified, not intractable, without status migrainosus: Secondary | ICD-10-CM | POA: Diagnosis not present

## 2015-05-19 DIAGNOSIS — I10 Essential (primary) hypertension: Secondary | ICD-10-CM | POA: Diagnosis not present

## 2015-05-19 DIAGNOSIS — R1084 Generalized abdominal pain: Secondary | ICD-10-CM | POA: Diagnosis not present

## 2015-05-19 DIAGNOSIS — R51 Headache: Secondary | ICD-10-CM | POA: Diagnosis present

## 2015-05-19 DIAGNOSIS — Z720 Tobacco use: Secondary | ICD-10-CM | POA: Insufficient documentation

## 2015-05-19 DIAGNOSIS — R519 Headache, unspecified: Secondary | ICD-10-CM

## 2015-05-19 LAB — BASIC METABOLIC PANEL
Anion gap: 6 (ref 5–15)
BUN: 8 mg/dL (ref 6–20)
CALCIUM: 8.4 mg/dL — AB (ref 8.9–10.3)
CO2: 24 mmol/L (ref 22–32)
CREATININE: 0.93 mg/dL (ref 0.61–1.24)
Chloride: 108 mmol/L (ref 101–111)
GFR calc Af Amer: >60 mL/min (ref >60)
GFR calc non Af Amer: >60 mL/min (ref >60)
Glucose, Bld: 94 mg/dL (ref 65–99)
Potassium: 4.3 mmol/L (ref 3.5–5.1)
SODIUM: 138 mmol/L (ref 135–145)

## 2015-05-19 LAB — CBC
HEMATOCRIT: 44.1 % (ref 39.0–52.0)
Hemoglobin: 15.4 g/dL (ref 13.0–17.0)
MCH: 30 pg (ref 26.0–34.0)
MCHC: 34.9 g/dL (ref 30.0–36.0)
MCV: 85.8 fL (ref 78.0–100.0)
PLATELETS: 162 10*3/uL (ref 150–400)
RBC: 5.14 MIL/uL (ref 4.22–5.81)
RDW: 13.2 % (ref 11.5–15.5)
WBC: 8.6 10*3/uL (ref 4.0–10.5)

## 2015-05-19 MED ORDER — DIPHENHYDRAMINE HCL 25 MG PO CAPS
50.0000 mg | ORAL_CAPSULE | Freq: Once | ORAL | Status: AC
  Administered 2015-05-19: 50 mg via ORAL
  Filled 2015-05-19: qty 2

## 2015-05-19 MED ORDER — DEXAMETHASONE 4 MG PO TABS
10.0000 mg | ORAL_TABLET | Freq: Once | ORAL | Status: AC
  Administered 2015-05-19: 10 mg via ORAL
  Filled 2015-05-19: qty 3

## 2015-05-19 MED ORDER — HYDROXYZINE HCL 25 MG PO TABS: 25.0000 mg | ORAL_TABLET | Freq: Four times a day (QID) | ORAL | Status: DC | PRN

## 2015-05-19 MED ORDER — METOCLOPRAMIDE HCL 10 MG PO TABS
10.0000 mg | ORAL_TABLET | Freq: Once | ORAL | Status: AC
  Administered 2015-05-19: 10 mg via ORAL
  Filled 2015-05-19: qty 1

## 2015-05-19 NOTE — Discharge Instructions (Signed)

## 2015-05-19 NOTE — ED Notes (Signed)
Pt. Stated, i got some type of bug that's all in my head ad on my back.  I can feel them crunching all the time.  i also have a stomach ache and a migraine headache.

## 2015-05-19 NOTE — ED Notes (Signed)
Pt states he has had bugs crawling on his head, back and legs x 1 month.  Pt states bugs crawled under his door in his room.  Pt describes bugs as stick shaped, blue and white that you can only see with a special light because they glow in the dark that stand on their back legs and bite him (feels like a needle being stuck in skin).  Pt states he has to sleep on his couch to keep away from him.  Pt states he has hx of bi-polar and usually takes depokote but has not been on it in months.  Pt denies hearing voices or hallucinations.

## 2015-05-19 NOTE — ED Notes (Signed)
Pt back inside 

## 2015-05-19 NOTE — ED Notes (Signed)
Pt stated as he was walking out the door he was "going to smoke and would be back in a min. "

## 2015-05-19 NOTE — ED Provider Notes (Signed)
CSN: [id number]     Arrival date & time 05/19/15  1252 History   First MD Initiated Contact with Patient 05/19/15 [phone]     Chief Complaint  Patient presents with  . Animal Bite  . Abdominal Pain  . Migraine     (Consider location/radiation/quality/duration/timing/severity/associated sxs/prior Treatment) HPI Comments: Here with concerns for insect bites. States he has bugs biting him when he's lying down asleep at night. The bites wake him up. He cannot show me a bite site. He has no bug with him to show me. When asked if I could see the bugs, he says, "if you had a special light you could see them." Denies any SI/HI. Has history of bipolar disorder, has been noncompliant with his depakote for months, but says he, "feels great." Patient also reports a frontal headache for 2 months. Happens daily. Rates at 9 out of 10. No alleviating or exacerbating factors. No blurry vision, nausea, vomiting, diarrhea. No numbness or weakness anywhere.  Patient is a 64 y.o. male presenting with animal bite, abdominal pain, and migraines. The history is provided by the patient.  Animal Bite Contact animal:  Insect Animal bite location: diffuse. Time since incident:  2 months Pain details:    Quality:  Itching   Severity:  Mild   Progression:  Unchanged Incident location: in his bed. Provoked: unprovoked   Notifications:  None Associated symptoms: no fever   Abdominal Pain Associated symptoms: no cough, no fever, no shortness of breath and no vomiting   Migraine Associated symptoms include abdominal pain. Pertinent negatives include no shortness of breath.    Past Medical History  Diagnosis Date  . Anxiety   . Shingles 2013  . Hypertension   . Shoulder pain    History reviewed. No pertinent past surgical history. No family history on file. History  Substance Use Topics  . Smoking status: Current Every Day Smoker -- 0.50 packs/day for 50 years    Types: Cigarettes  . Smokeless tobacco: Not  on file     Comment: 10  cigarettes per day, waits an hour between  . Alcohol Use: No    Review of Systems  Constitutional: Negative for fever.  Respiratory: Negative for cough and shortness of breath.   Gastrointestinal: Positive for abdominal pain. Negative for vomiting.  All other systems reviewed and are negative.     Allergies  Review of patient's allergies indicates no known allergies.  Home Medications   Prior to Admission medications   Medication Sig Start Date End Date Taking? Authorizing Provider  acetaminophen (TYLENOL) 500 MG tablet Take 500 mg by mouth every 6 (six) hours as needed for mild pain or headache.   Yes Historical Provider, MD  amLODipine (NORVASC) 5 MG tablet Take 1 tablet (5 mg total) by mouth daily. Patient not taking: Reported on 05/19/2015 12/09/14   Myra Rude, MD  atorvastatin (LIPITOR) 40 MG tablet TAKE 1 TABLET BY MOUTH EVERY DAY Patient not taking: Reported on 01/25/2015 02/08/14   Lonia Skinner, MD  cyclobenzaprine (FLEXERIL) 10 MG tablet Take 1 tablet (10 mg total) by mouth 2 (two) times daily as needed for muscle spasms. Patient not taking: Reported on 01/25/2015 11/25/14   Roxy Horseman, PA-C  gabapentin (NEURONTIN) 300 MG capsule Take 2 capsules (600 mg total) by mouth 3 (three) times daily. Patient not taking: Reported on 05/19/2015 12/09/14   Myra Rude, MD  meloxicam (MOBIC) 15 MG tablet TAKE 1 TABLET BY MOUTH DAILY AS NEEDED FOR  PAIN Patient not taking: Reported on 01/25/2015 07/14/14   Myra Rude, MD   BP 161/94 mmHg  Pulse 91  Temp(Src) 98.4 F (36.9 C) (Oral)  Resp 20  SpO2 98% Physical Exam  Constitutional: He is oriented to person, place, and time. He appears well-developed and well-nourished. No distress.  HENT:  Head: Normocephalic and atraumatic.  Mouth/Throat: Oropharynx is clear and moist. No oropharyngeal exudate.  Eyes: EOM are normal. Pupils are equal, round, and reactive to light.  Neck: Normal range of  motion. Neck supple.  Cardiovascular: Normal rate and regular rhythm.  Exam reveals no friction rub.   No murmur heard. Pulmonary/Chest: Effort normal and breath sounds normal. No respiratory distress. He has no wheezes. He has no rales.  Abdominal: Soft. He exhibits no distension. There is no tenderness. There is no rebound.  Musculoskeletal: Normal range of motion. He exhibits no edema.  Neurological: He is alert and oriented to person, place, and time.  Skin: No rash (No insect bites appreciated) noted. He is not diaphoretic.  Nursing note and vitals reviewed.   ED Course  Procedures (including critical care time) Labs Review Labs Reviewed  BASIC METABOLIC PANEL - Abnormal; Notable for the following:    Calcium 8.4 (*)    All other components within normal limits  CBC    Imaging Review Ct Head Wo Contrast  05/19/2015   CLINICAL DATA:  Migraine-type headaches with nausea and vomiting  EXAM: CT HEAD WITHOUT CONTRAST  TECHNIQUE: Contiguous axial images were obtained from the base of the skull through the vertex without intravenous contrast.  COMPARISON:  None.  FINDINGS: The ventricles are normal in size and configuration. There is, however, mild cerebellar atrophy. There is no intracranial mass, hemorrhage, extra-axial fluid collection, or midline shift. There is rather minimal small vessel disease in the centra semiovale bilaterally. Elsewhere gray-white compartments are normal. No acute infarct evident. Bony calvarium appears intact. Orbits appear symmetric and within normal limits bilaterally on this study. The mastoid air cells are clear.  IMPRESSION: Mild cerebellar atrophy. Minimal periventricular small vessel disease. No intracranial mass, hemorrhage, or acute appearing infarct.   Electronically Signed   By: Bretta Bang III M.D.   On: 05/19/2015 16:33     EKG Interpretation None      MDM   Final diagnoses:  Nonintractable headache, unspecified chronicity pattern,  unspecified headache type    64 year old male here with concerns for bug bites and headache. Bug bites: I cannot see any rash or insect bites. I suspect this could be psych related. He denies any cocaine use. Symptoms only formication, but without drug use, unclear origin. He wants a cream or shampoo for the itching. We'll give prescription for Atarax Headache: Present for 2 months. No neurologic symptoms. Denies prior hx of headaches. Will scan his head. Offered IV headache cocktail, however he refused IV meds. Will give meds orally. No real relief after his meds. CT negative. Patient feels like his headache is due to the insect bites. I think these symtpoms are Psych related. Instructed to f/u with PCP. Labs ok. Given atarax for some itching and hopefully to help him rest.   Elwin Mocha, MD 05/19/15 1725

## 2015-05-19 NOTE — ED Notes (Signed)
Pt off unit with CT 

## 2015-07-11 ENCOUNTER — Encounter: Payer: Self-pay | Admitting: Family Medicine

## 2015-07-11 ENCOUNTER — Ambulatory Visit (INDEPENDENT_AMBULATORY_CARE_PROVIDER_SITE_OTHER): Payer: Medicare HMO | Admitting: Family Medicine

## 2015-07-11 VITALS — BP 153/129 | HR 118 | Temp 97.6°F | Wt 164.0 lb

## 2015-07-11 DIAGNOSIS — R6884 Jaw pain: Secondary | ICD-10-CM | POA: Diagnosis not present

## 2015-07-11 DIAGNOSIS — B0229 Other postherpetic nervous system involvement: Secondary | ICD-10-CM

## 2015-07-11 DIAGNOSIS — Z720 Tobacco use: Secondary | ICD-10-CM

## 2015-07-11 LAB — CBC
HEMATOCRIT: 46 % (ref 39.0–52.0)
Hemoglobin: 15.6 g/dL (ref 13.0–17.0)
MCH: 29.5 pg (ref 26.0–34.0)
MCHC: 33.9 g/dL (ref 30.0–36.0)
MCV: 87.1 fL (ref 78.0–100.0)
MPV: 10.8 fL (ref 8.6–12.4)
Platelets: 175 10*3/uL (ref 150–400)
RBC: 5.28 MIL/uL (ref 4.22–5.81)
RDW: 13.5 % (ref 11.5–15.5)
WBC: 10.7 10*3/uL — ABNORMAL HIGH (ref 4.0–10.5)

## 2015-07-11 MED ORDER — PENICILLIN V POTASSIUM 500 MG PO TABS: 500.0000 mg | ORAL_TABLET | Freq: Four times a day (QID) | ORAL | Status: DC

## 2015-07-11 MED ORDER — GABAPENTIN 300 MG PO CAPS: 600.0000 mg | ORAL_CAPSULE | Freq: Three times a day (TID) | ORAL | Status: DC

## 2015-07-11 MED ORDER — CEFTRIAXONE SODIUM 1 G IJ SOLR
250.0000 mg | INTRAMUSCULAR | Status: DC
  Administered 2015-07-11: 250 mg via INTRAMUSCULAR

## 2015-07-11 MED ORDER — CEFTRIAXONE SODIUM 250 MG IJ SOLR: 250.0000 mg | Freq: Once | INTRAMUSCULAR | Status: DC

## 2015-07-11 NOTE — Patient Instructions (Signed)
Thank you for coming in,   Please follow up with me in 1 to 2 days.   I have started penicillin for your dental pain.   If you pain gets severe please seek immediate care in the ER.   Please bring all of your medications with you to each visit.    Please feel free to call with any questions or concerns at any time, at [phone]. --Dr. Jordan Likes

## 2015-07-11 NOTE — Assessment & Plan Note (Signed)
Stable.  Refilled gabapentin. 

## 2015-07-11 NOTE — Assessment & Plan Note (Signed)
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 If no improvement consider switching to Augmentin and CT neck w/ contrast  - CBC  - given indications for immediate care  - discussed with Dr. Deirdre Priest

## 2015-07-11 NOTE — Assessment & Plan Note (Signed)
No indication of quitting  Reports only cold [place] works for him.

## 2015-07-11 NOTE — Progress Notes (Signed)
Subjective:    Bryan Davies - 64 y.o. male MRN [id number]  Date of birth: 1951-07-13  HPI  Bryan Davies is here for dental pain.   Dental pain Location: right lower jaw.  Pain started: started two weeks. Got worse about 1 week ago. Swollen today which is what brought him in.  Pain is: achy  Severity: 9/10. Intermittent  Medications tried: ibuprofen has helped, tylenol no improvement  Similar pain previously: long time ago. Was given ABX and once the swelling was improved his tooth was pulled.   Symptoms Redness: no Swelling: yes  Fever: felt warm yesterday was 101 Rash: no He tried some oragel but vomited after he swollowed it.  Having some diaphoresis.   Tobacco abuse: half a pack per day. Smoked since he was 64 years old. Has tried to quit cold [place] and it lasted thirty days. Has tried 3 or 4 times.   Post herpatic neuralgia:  He takes gabapentin for this.  Had herpes zoster last year.  Sharp pain with improvement with the gabapentin.   Health Maintenance:  Health Maintenance Due  Topic Date Due  . Hepatitis C Screening  Jul 17, 1951  . HIV Screening  12/14/1967  . INFLUENZA VACCINE  05/30/2015    -  reports that he has been smoking Cigarettes.  He has a 25 pack-year smoking history. He does not have any smokeless tobacco history on file. - Review of Systems: Per HPI. - Past Medical History: Patient Active Problem List   Diagnosis Date Noted  . Pain in lower jaw 07/11/2015  . Hyperlipidemia 02/11/2014  . 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 Dyshydrosis 07/26/2013  . 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 Hypertension 05/21/2012  . Post herpetic neuralgia 05/21/2012  . Bipolar disorder 05/21/2012  . Tobacco abuse 05/21/2012   - Medications: reviewed and updated Current Outpatient Prescriptions  Medication Sig Dispense Refill  . acetaminophen (TYLENOL) 500 MG tablet Take 500 mg by mouth every 6 (six) hours as needed for mild pain or headache.    Marland Kitchen  amLODipine (NORVASC) 5 MG tablet Take 1 tablet (5 mg total) by mouth daily. (Patient not taking: Reported on 05/19/2015) 90 tablet 0  . atorvastatin (LIPITOR) 40 MG tablet TAKE 1 TABLET BY MOUTH EVERY DAY (Patient not taking: Reported on 01/25/2015) 90 tablet 0  . cyclobenzaprine (FLEXERIL) 10 MG tablet Take 1 tablet (10 mg total) by mouth 2 (two) times daily as needed for muscle spasms. (Patient not taking: Reported on 01/25/2015) 20 tablet 0  . gabapentin (NEURONTIN) 300 MG capsule Take 2 capsules (600 mg total) by mouth 3 (three) times daily. 180 capsule 0  . hydrOXYzine (ATARAX/VISTARIL) 25 MG tablet Take 1 tablet (25 mg total) by mouth every 6 (six) hours as needed. 12 tablet 0  . meloxicam (MOBIC) 15 MG tablet TAKE 1 TABLET BY MOUTH DAILY AS NEEDED FOR PAIN (Patient not taking: Reported on 01/25/2015) 30 tablet 0  . penicillin v potassium (VEETID) 500 MG tablet Take 1 tablet (500 mg total) by mouth 4 (four) times daily. For a total of 7 days. 28 tablet 0   Current Facility-Administered Medications  Medication Dose Route Frequency Provider Last Rate Last Dose  . cefTRIAXone (ROCEPHIN) injection 250 mg  250 mg Intramuscular Q24H Myra Rude, MD   250 mg at 07/11/15 1517     Review of Systems See HPI     Objective:   Physical Exam BP 153/129 mmHg  Pulse 118  Temp(Src) 97.6  F (36.4 C) (Oral)  Wt 164 lb (74.39 kg) Gen: NAD, alert, cooperative with exam,  HEENT: TTP in the right lower mandible with some swelling, no TTP in the soft tissue of the neck. Poor dentition. 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 If no improvement consider switching to Augmentin and CT neck w/ contrast  - CBC  - given indications for immediate care  - discussed with Dr. Deirdre Priest   Tobacco abuse No indication of quitting  Reports only cold [place] works for him.   Post herpetic neuralgia Stable  - Refilled gabapentin

## 2015-07-14 ENCOUNTER — Ambulatory Visit (INDEPENDENT_AMBULATORY_CARE_PROVIDER_SITE_OTHER): Payer: Medicare HMO | Admitting: Internal Medicine

## 2015-07-14 ENCOUNTER — Encounter: Payer: Self-pay | Admitting: Internal Medicine

## 2015-07-14 VITALS — BP 123/87 | HR 100 | Temp 97.8°F | Wt 167.0 lb

## 2015-07-14 DIAGNOSIS — R6884 Jaw pain: Secondary | ICD-10-CM

## 2015-07-14 NOTE — Patient Instructions (Signed)
Continue Penicillin and Mobic for dental abscess. Try to decrease the amount you are smoking, bad for infection. Any fevers, chills, worsening swelling please come back in or go to emergency room. Please follow up on Monday.    Abscessed Tooth An abscessed tooth is an infection around your tooth. It may be caused by holes or damage to the tooth (cavity) or a dental disease. An abscessed tooth causes mild to very bad pain in and around the tooth. See your dentist right away if you have tooth or gum pain. HOME CARE  Take your medicine as told. Finish it even if you start to feel better.  Do not drive after taking pain medicine.  Rinse your mouth (gargle) often with salt water ( teaspoon salt in 8 ounces of warm water).  Do not apply heat to the outside of your face. GET HELP RIGHT AWAY IF:   You have a temperature by mouth above 102 F (38.9 C), not controlled by medicine.  You have chills and a very bad headache.  You have problems breathing or swallowing.  Your mouth will not open.  You develop puffiness (swelling) on the neck or around the eye.  Your pain is not helped by medicine.  Your pain is getting worse instead of better. MAKE SURE YOU:   Understand these instructions.  Will watch your condition.  Will get help right away if you are not doing well or get worse. Document Released: 04/02/2008 Document Revised: 01/07/2012 Document Reviewed: 01/23/2011 [facility] Patient Information 2015 [place], [place]. This information is not intended to replace advice given to you by your health care provider. Make sure you discuss any questions you have with your health care provider.

## 2015-07-14 NOTE — Progress Notes (Signed)
Patient ID: Bryan Davies, male   DOB: February 23, 1951, 64 y.o.   MRN: [id number]   [name] Noralee Chars, MD Phone: [phone]  Subjective:   # Dental Abscess - improving. Took Penicillin only once yesterday. Pain improved with mobic. Still having pain thought. Waiting till infection cleared to make an appointment with Dentist.   All relevant systems were reviewed and were negative unless otherwise noted in the HPI  Past Medical History Reviewed problem list.  Medications- reviewed and updated Current Outpatient Prescriptions  Medication Sig Dispense Refill  . acetaminophen (TYLENOL) 500 MG tablet Take 500 mg by mouth every 6 (six) hours as needed for mild pain or headache.    Marland Kitchen amLODipine (NORVASC) 5 MG tablet Take 1 tablet (5 mg total) by mouth daily. (Patient not taking: Reported on 05/19/2015) 90 tablet 0  . atorvastatin (LIPITOR) 40 MG tablet TAKE 1 TABLET BY MOUTH EVERY DAY (Patient not taking: Reported on 01/25/2015) 90 tablet 0  . cyclobenzaprine (FLEXERIL) 10 MG tablet Take 1 tablet (10 mg total) by mouth 2 (two) times daily as needed for muscle spasms. (Patient not taking: Reported on 01/25/2015) 20 tablet 0  . gabapentin (NEURONTIN) 300 MG capsule Take 2 capsules (600 mg total) by mouth 3 (three) times daily. 180 capsule 0  . hydrOXYzine (ATARAX/VISTARIL) 25 MG tablet Take 1 tablet (25 mg total) by mouth every 6 (six) hours as needed. 12 tablet 0  . meloxicam (MOBIC) 15 MG tablet TAKE 1 TABLET BY MOUTH DAILY AS NEEDED FOR PAIN (Patient not taking: Reported on 01/25/2015) 30 tablet 0  . penicillin v potassium (VEETID) 500 MG tablet Take 1 tablet (500 mg total) by mouth 4 (four) times daily. For a total of 7 days. 28 tablet 0   No current facility-administered medications for this visit.   Chief complaint-noted No additions to family history Social history- patient is a smoker  Objective: BP 123/87 mmHg  Pulse 100  Temp(Src) 97.8 F (36.6 C) (Oral)  Wt  167 lb (75.751 kg) Gen: NAD, alert, cooperative with exam Neck: FROM, supple, slight lymphadenopathy under the right mandible  HEENT: Mouth with several missing teething. Right lower jaw with half a tooth - with several large caries, surround by a slightly erythematous and tender gum.   CV: RRR, good S1/S2, no murmur, cap refill <3 Resp: CTABL, no wheezes, non-labored  Assessment/Plan: Pain in lower jaw No fevers. Decreasing swelling - Has been taking Mobic daily, which has helped with the pain  - Will continue to take Penicillin QID, patient did not realize he needs to take penicillin 4 times daily, performed teach-back  - Return Precautions Discussed - Advised patient to make a dental appointment for following  - Follow up with [facility] on Monday

## 2015-07-14 NOTE — Assessment & Plan Note (Addendum)
No fevers. Decreasing swelling - Has been taking Mobic daily, which has helped with the pain  - Will continue to take Penicillin QID, patient did not realize he needs to take penicillin 4 times daily, performed teach-back  - Return Precautions Discussed - Advised patient to make a dental appointment for following  - Follow up with [facility] on Monday

## 2015-11-24 DIAGNOSIS — B0222 Postherpetic trigeminal neuralgia: Secondary | ICD-10-CM | POA: Diagnosis not present

## 2015-11-24 DIAGNOSIS — I10 Essential (primary) hypertension: Secondary | ICD-10-CM | POA: Diagnosis not present

## 2015-12-05 ENCOUNTER — Ambulatory Visit (INDEPENDENT_AMBULATORY_CARE_PROVIDER_SITE_OTHER): Payer: Medicare HMO | Admitting: Family Medicine

## 2015-12-05 VITALS — BP 127/93 | HR 95 | Temp 97.7°F | Wt 174.0 lb

## 2015-12-05 DIAGNOSIS — M25512 Pain in left shoulder: Secondary | ICD-10-CM

## 2015-12-05 DIAGNOSIS — Z1159 Encounter for screening for other viral diseases: Secondary | ICD-10-CM | POA: Diagnosis not present

## 2015-12-05 DIAGNOSIS — I10 Essential (primary) hypertension: Secondary | ICD-10-CM

## 2015-12-05 DIAGNOSIS — R3911 Hesitancy of micturition: Secondary | ICD-10-CM | POA: Diagnosis not present

## 2015-12-05 DIAGNOSIS — R69 Illness, unspecified: Secondary | ICD-10-CM | POA: Diagnosis not present

## 2015-12-05 DIAGNOSIS — Z23 Encounter for immunization: Secondary | ICD-10-CM

## 2015-12-05 DIAGNOSIS — B0229 Other postherpetic nervous system involvement: Secondary | ICD-10-CM

## 2015-12-05 DIAGNOSIS — Z114 Encounter for screening for human immunodeficiency virus [HIV]: Secondary | ICD-10-CM | POA: Diagnosis not present

## 2015-12-05 DIAGNOSIS — Z720 Tobacco use: Secondary | ICD-10-CM

## 2015-12-05 LAB — POCT URINALYSIS DIPSTICK
BILIRUBIN UA: NEGATIVE
Glucose, UA: NEGATIVE
KETONES UA: NEGATIVE
Leukocytes, UA: NEGATIVE
NITRITE UA: NEGATIVE
PH UA: 6.5
Protein, UA: NEGATIVE
RBC UA: NEGATIVE
SPEC GRAV UA: 1.015
Urobilinogen, UA: 1

## 2015-12-05 MED ORDER — AMLODIPINE BESYLATE 5 MG PO TABS: 5.0000 mg | ORAL_TABLET | Freq: Every day | ORAL | Status: DC

## 2015-12-05 MED ORDER — TAMSULOSIN HCL 0.4 MG PO CAPS: 0.4000 mg | ORAL_CAPSULE | Freq: Every day | ORAL | Status: DC

## 2015-12-05 NOTE — Assessment & Plan Note (Signed)
Has been occurring for at least three years now.  May need to consider other etiology if still present  - advised to take gabapentin three times a day instead of BID  - consider chest x-ray at follow up.

## 2015-12-05 NOTE — Assessment & Plan Note (Signed)
Would like to quit but doesn't want to use medication  - counseling given.

## 2015-12-05 NOTE — Progress Notes (Addendum)
Subjective:    Bryan Davies - 65 y.o. male MRN [id number]  Date of birth: Apr 13, 1951  HPI  Bryan Davies is here for hypertension follow-up.  HTN Disease Monitoring: Home BP Monitoring nurse checked it one time but he can't give a specific reading   Medications:amlodipine  Chest pain- no     Dyspnea- no Compliance-  yes.  Lightheadedness-  no   Edema- no  Left shoulder pain  Location: lateral left shoulder  Pain started: 6 months, reports lying on it wrong and has hurt since  Reports pain worse with turning the steering wheel while driving  Pain is: sharp  Severity: 7/10 Medications tried: none Recent trauma: no prior injury  Similar pain previously: no  Hurts to lie on his left side.  Denies any numbness or tingling in his hand  Imaging of left shoulder 11/25/2000: showing normal AC joint  No history of dislocation.  Pain reproduced with abduction Hasn't had much relief with his previous remedies   Symptoms Redness:no Swelling:no Fever: no Weakness: no Weight loss: no Rash: no  Urinary complaints. Trouble initiating a stream  Had to stand at the toilet 3-4 mintues to intiate a stream  Has to push hard to initate stream  Reports weak stream  Has been occurring since over the age of 65 year old  Gets up 7 times per night to urinate. Produces urine every time  He has incomplete voiding.  Drinks regular coffee three times a day  Drinks very little water  Has at least two sodas per day (sprite and ginger ale)   Post herpetic neuralgia:  Was seen in 2013 Gabapentin works intermittently. Taking it two times a day Reports as sharp like a knife and behind his rib   Tobacco abuse: He has smoked since he was 65 years of age. He has able to quit one time. Was able to quit for 30 days but restarted secondary to stress. Previously quit using cold [place] method Smokes a half a pack per day and currently Is not interested and pursuing medical interventions at this  time  Health Maintenance:  - flu vaccine today  - Screening for HIV and hepatitis C today Health Maintenance Due  Topic Date Due  . Hepatitis C Screening  03/26/1951  . HIV Screening  12/14/1967  . INFLUENZA VACCINE  05/30/2015    -  reports that he has been smoking Cigarettes.  He has a 25 pack-year smoking history. He does not have any smokeless tobacco history on file. - Review of Systems: Per HPI. - Past Medical History: Patient Active Problem List   Diagnosis Date Noted  . Left shoulder pain 12/05/2015  . Urinary hesitancy 12/05/2015  . Pain in lower jaw 07/11/2015  . Hyperlipidemia 02/11/2014  . 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 Dyshydrosis 07/26/2013  . 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 Hypertension 05/21/2012  . Post herpetic neuralgia 05/21/2012  . Bipolar disorder (HCC) 05/21/2012  . Tobacco abuse 05/21/2012   - Medications: reviewed and updated Current Outpatient Prescriptions  Medication Sig Dispense Refill  . acetaminophen (TYLENOL) 500 MG tablet Take 500 mg by mouth every 6 (six) hours as needed for mild pain or headache.    Marland Kitchen amLODipine (NORVASC) 5 MG tablet Take 1 tablet (5 mg total) by mouth daily. 90 tablet 1  . atorvastatin (LIPITOR) 40 MG tablet TAKE 1 TABLET BY MOUTH EVERY DAY (Patient not taking: Reported on 01/25/2015) 90 tablet 0  . cyclobenzaprine (FLEXERIL) 10  MG tablet Take 1 tablet (10 mg total) by mouth 2 (two) times daily as needed for muscle spasms. (Patient not taking: Reported on 01/25/2015) 20 tablet 0  . gabapentin (NEURONTIN) 300 MG capsule Take 2 capsules (600 mg total) by mouth 3 (three) times daily. 180 capsule 0  . hydrOXYzine (ATARAX/VISTARIL) 25 MG tablet Take 1 tablet (25 mg total) by mouth every 6 (six) hours as needed. 12 tablet 0  . meloxicam (MOBIC) 15 MG tablet TAKE 1 TABLET BY MOUTH DAILY AS NEEDED FOR PAIN (Patient not taking: Reported on 01/25/2015) 30 tablet 0  . penicillin v potassium (VEETID) 500 MG tablet Take 1  tablet (500 mg total) by mouth 4 (four) times daily. For a total of 7 days. 28 tablet 0  . tamsulosin (FLOMAX) 0.4 MG CAPS capsule Take 1 capsule (0.4 mg total) by mouth daily. 30 capsule 3   No current facility-administered medications for this visit.     Review of Systems See HPI     Objective:   Physical Exam BP 127/93 mmHg  Pulse 95  Temp(Src) 97.7 F (36.5 C) (Oral)  Wt 174 lb (78.926 kg) Gen: NAD, alert, cooperative with exam,  HEENT: NCAT, PERRL, clear conjunctiva, supple neck CV: RRR, good S1/S2, II/IV systolic murmur,  Resp: CTABL, no wheezes, non-labored Abd: SNTND, BS present, no guarding or organomegaly Skin: no rashes, normal turgor  Neuro: no gross deficits.  Psych: good insight, alert and oriented Rectal: smooth +1 prostate that is non-tender, normal sphincter tone,  MSK: Tenderness to palpation on the inferior border of the lateral T8 rib Shoulder: Inspection reveals no abnormalities, atrophy or asymmetry. Palpation is normal with no tenderness over AC joint or bicipital groove. ROM is full in all planes. Rotator cuff strength normal throughout. No signs of impingement Hawkin's tests Positive Neer's test  No labral pathology noted with negative Obrien's, negative clunk and good stability. Normal scapular function observed. No painful arc and no drop arm sign. No apprehension sign    INJECTION: Left Shoulder injection  Patient was given informed consent, signed copy in the chart. Appropriate time out was taken. Area prepped and draped in usual sterile fashion. One cc of methylprednisolone 40 mg/ml plus four cc of 1% lidocaine without epinephrine was injected into the Subacromial space using a posterior approach. The patient tolerated the procedure well. There were no complications. Post procedure instructions were given.   Assessment & Plan:   Hypertension Controlled today - refilled amlodipine  - Lipid panel today  Post herpetic neuralgia Has been  occurring for at least three years now.  May need to consider other etiology if still present  - advised to take gabapentin three times a day instead of BID  - consider chest x-ray at follow up.   Tobacco abuse Would like to quit but doesn't want to use medication  - counseling given.   Left shoulder pain Most likely related to rotator cuff pathology He had good strength throughout the exam but pain reproduced with Neer's test  No pain with palpation over the [facility] joint Injection given today Does not want to perform any physical therapy - Home modalities sheet given as well as thera band - If still in pain can consider reimaging since last x-ray was from 2002 versus referral to sports medicine for ultrasound evaluation as well as consideration of nitroglycerin patch  Urinary hesitancy Symptoms most suggestive of BPH - Urinalysis and PSA today.  - Started Flomax. If still having complaints after 2 weeks  then consider titrating up on Flomax - Discussed with Dr. Mauricio Po

## 2015-12-05 NOTE — Assessment & Plan Note (Addendum)
Controlled today - refilled amlodipine  - Lipid panel today

## 2015-12-05 NOTE — Assessment & Plan Note (Signed)
Most likely related to rotator cuff pathology He had good strength throughout the exam but pain reproduced with Neer's test  No pain with palpation over the [facility] joint Injection given today Does not want to perform any physical therapy - Home modalities sheet given as well as thera band - If still in pain can consider reimaging since last x-ray was from 2002 versus referral to sports medicine for ultrasound evaluation as well as consideration of nitroglycerin patch

## 2015-12-05 NOTE — Assessment & Plan Note (Signed)
Symptoms most suggestive of BPH - Urinalysis and PSA today.  - Started Flomax. If still having complaints after 2 weeks then consider titrating up on Flomax - Discussed with Dr. Mauricio Po

## 2015-12-05 NOTE — Patient Instructions (Signed)
Thank you for coming in,   I will call or send a letter with the results from today.   I will send something in for your prostate.   Please increase your gabapentin to three times per day   I will send something in for your urinary problems.   Please bring all of your medications with you to each visit.   Sign up for My Chart to have easy access to your labs results, and communication with your Primary care physician   Please feel free to call with any questions or concerns at any time, at [phone]. --Dr. Jordan Likes  Benign Prostatic Hyperplasia An enlarged prostate (benign prostatic hyperplasia) is common in older men. You may experience the following:  Weak urine stream.  Dribbling.  Feeling like the bladder has not emptied completely.  Difficulty starting urination.  Getting up frequently at night to urinate.  Urinating more frequently during the day. HOME CARE INSTRUCTIONS  Monitor your prostatic hyperplasia for any changes. The following actions may help to alleviate any discomfort you are experiencing:  Give yourself time when you urinate.  Stay away from alcohol.  Avoid beverages containing caffeine, such as coffee, tea, and colas, because they can make the problem worse.  Avoid decongestants, antihistamines, and some prescription medicines that can make the problem worse.  Follow up with your health care provider for further treatment as recommended. SEEK MEDICAL CARE IF:  You are experiencing progressive difficulty voiding.  Your urine stream is progressively getting narrower.  You are awaking from sleep with the urge to void more frequently.  You are constantly feeling the need to void.  You experience loss of urine, especially in small amounts. SEEK IMMEDIATE MEDICAL CARE IF:   You develop increased pain with urination or are unable to urinate.  You develop severe abdominal pain, vomiting, a high fever, or fainting.  You develop back pain or blood  in your urine. MAKE SURE YOU:   Understand these instructions.  Will watch your condition.  Will get help right away if you are not doing well or get worse.   This information is not intended to replace advice given to you by your health care provider. Make sure you discuss any questions you have with your health care provider.   Document Released: 10/15/2005 Document Revised: 11/05/2014 Document Reviewed: 03/17/2013 Elsevier Interactive Patient Education [employer]

## 2015-12-06 LAB — PSA, MEDICARE: PSA: 1.01 ng/mL (ref <=4.00)

## 2015-12-06 LAB — LIPID PANEL
CHOL/HDL RATIO: 3.6 ratio (ref <=5.0)
Cholesterol: 152 mg/dL (ref 125–200)
HDL: 42 mg/dL (ref >=40)
LDL Cholesterol: 96 mg/dL (ref <130)
TRIGLYCERIDES: 69 mg/dL (ref <150)
VLDL: 14 mg/dL (ref <30)

## 2015-12-06 LAB — HEPATITIS C ANTIBODY: HCV Ab: NEGATIVE

## 2015-12-06 LAB — HIV ANTIBODY (ROUTINE TESTING W REFLEX): HIV: NONREACTIVE

## 2015-12-07 MED ORDER — METHYLPREDNISOLONE ACETATE 40 MG/ML IJ SUSP
40.0000 mg | Freq: Once | INTRAMUSCULAR | Status: AC
  Administered 2015-12-05: 40 mg via INTRAMUSCULAR

## 2015-12-07 NOTE — Addendum Note (Signed)
Addended by: Glennie Hawk on: 12/07/2015 08:27 AM   Modules accepted: Orders

## 2015-12-09 ENCOUNTER — Encounter: Payer: Self-pay | Admitting: Family Medicine

## 2016-04-09 ENCOUNTER — Other Ambulatory Visit: Payer: Self-pay | Admitting: *Deleted

## 2016-04-09 DIAGNOSIS — R3911 Hesitancy of micturition: Secondary | ICD-10-CM

## 2016-04-09 MED ORDER — TAMSULOSIN HCL 0.4 MG PO CAPS: 0.4000 mg | ORAL_CAPSULE | Freq: Every day | ORAL | Status: DC

## 2016-07-24 ENCOUNTER — Other Ambulatory Visit: Payer: Self-pay | Admitting: Family Medicine

## 2016-07-24 DIAGNOSIS — B0229 Other postherpetic nervous system involvement: Secondary | ICD-10-CM

## 2016-07-24 DIAGNOSIS — I10 Essential (primary) hypertension: Secondary | ICD-10-CM

## 2016-07-24 DIAGNOSIS — R3911 Hesitancy of micturition: Secondary | ICD-10-CM

## 2016-07-24 NOTE — Telephone Encounter (Signed)
Patient came to office requesting refill on his meds.: Gabapentin, his BP med and his med for prostate. Please let patient know when done [phone]

## 2016-07-25 ENCOUNTER — Other Ambulatory Visit: Payer: Self-pay | Admitting: Family Medicine

## 2016-07-25 DIAGNOSIS — B0229 Other postherpetic nervous system involvement: Secondary | ICD-10-CM

## 2016-07-25 MED ORDER — GABAPENTIN 300 MG PO CAPS: 600.0000 mg | ORAL_CAPSULE | Freq: Three times a day (TID) | ORAL | 0 refills | Status: DC

## 2016-07-25 MED ORDER — TAMSULOSIN HCL 0.4 MG PO CAPS: 0.4000 mg | ORAL_CAPSULE | Freq: Every day | ORAL | 3 refills | Status: DC

## 2016-07-25 MED ORDER — AMLODIPINE BESYLATE 5 MG PO TABS: 5.0000 mg | ORAL_TABLET | Freq: Every day | ORAL | 0 refills | Status: DC

## 2016-07-25 NOTE — Telephone Encounter (Signed)
Will refill meds once. Called pt and informed him that refill was sent. Needs an appointment for further refills. Please have the patient make an appointment soon.

## 2016-08-18 DIAGNOSIS — R69 Illness, unspecified: Secondary | ICD-10-CM | POA: Diagnosis not present

## 2017-01-14 DIAGNOSIS — R011 Cardiac murmur, unspecified: Secondary | ICD-10-CM | POA: Diagnosis not present

## 2017-01-14 DIAGNOSIS — Z0000 Encounter for general adult medical examination without abnormal findings: Secondary | ICD-10-CM | POA: Diagnosis not present

## 2017-01-14 DIAGNOSIS — Z79899 Other long term (current) drug therapy: Secondary | ICD-10-CM | POA: Diagnosis not present

## 2017-01-14 DIAGNOSIS — B0223 Postherpetic polyneuropathy: Secondary | ICD-10-CM | POA: Diagnosis not present

## 2017-01-14 DIAGNOSIS — I10 Essential (primary) hypertension: Secondary | ICD-10-CM | POA: Diagnosis not present

## 2017-01-14 DIAGNOSIS — R42 Dizziness and giddiness: Secondary | ICD-10-CM | POA: Diagnosis not present

## 2017-01-14 DIAGNOSIS — R69 Illness, unspecified: Secondary | ICD-10-CM | POA: Diagnosis not present

## 2017-01-14 DIAGNOSIS — Z6826 Body mass index (BMI) 26.0-26.9, adult: Secondary | ICD-10-CM | POA: Diagnosis not present

## 2017-01-14 DIAGNOSIS — Z9114 Patient's other noncompliance with medication regimen: Secondary | ICD-10-CM | POA: Diagnosis not present

## 2017-10-23 DIAGNOSIS — R69 Illness, unspecified: Secondary | ICD-10-CM | POA: Diagnosis not present

## 2018-10-20 ENCOUNTER — Emergency Department (HOSPITAL_COMMUNITY)
Admission: EM | Admit: 2018-10-20 | Discharge: 2018-10-20 | Disposition: A | Discharge status: Home / Self Care | Payer: Medicare Other | Attending: Emergency Medicine | Admitting: Emergency Medicine

## 2018-10-20 ENCOUNTER — Other Ambulatory Visit: Payer: Self-pay

## 2018-10-20 ENCOUNTER — Emergency Department (HOSPITAL_COMMUNITY): Payer: Medicare Other

## 2018-10-20 ENCOUNTER — Encounter (HOSPITAL_COMMUNITY): Payer: Self-pay | Admitting: *Deleted

## 2018-10-20 DIAGNOSIS — M25511 Pain in right shoulder: Secondary | ICD-10-CM | POA: Insufficient documentation

## 2018-10-20 DIAGNOSIS — G8929 Other chronic pain: Secondary | ICD-10-CM

## 2018-10-20 DIAGNOSIS — R51 Headache: Secondary | ICD-10-CM | POA: Diagnosis not present

## 2018-10-20 DIAGNOSIS — R0789 Other chest pain: Secondary | ICD-10-CM | POA: Diagnosis present

## 2018-10-20 DIAGNOSIS — F17210 Nicotine dependence, cigarettes, uncomplicated: Secondary | ICD-10-CM | POA: Insufficient documentation

## 2018-10-20 DIAGNOSIS — R519 Headache, unspecified: Secondary | ICD-10-CM

## 2018-10-20 DIAGNOSIS — M545 Low back pain, unspecified: Secondary | ICD-10-CM

## 2018-10-20 DIAGNOSIS — I10 Essential (primary) hypertension: Secondary | ICD-10-CM | POA: Diagnosis not present

## 2018-10-20 LAB — CBC
HCT: 49.3 % (ref 39.0–52.0)
HEMOGLOBIN: 16.1 g/dL (ref 13.0–17.0)
MCH: 29.3 pg (ref 26.0–34.0)
MCHC: 32.7 g/dL (ref 30.0–36.0)
MCV: 89.8 fL (ref 80.0–100.0)
Platelets: 182 10*3/uL (ref 150–400)
RBC: 5.49 MIL/uL (ref 4.22–5.81)
RDW: 12.3 % (ref 11.5–15.5)
WBC: 6.4 10*3/uL (ref 4.0–10.5)
nRBC: 0 % (ref 0.0–0.2)

## 2018-10-20 LAB — BASIC METABOLIC PANEL
ANION GAP: 11 (ref 5–15)
BUN: 15 mg/dL (ref 8–23)
CO2: 23 mmol/L (ref 22–32)
Calcium: 8.8 mg/dL — ABNORMAL LOW (ref 8.9–10.3)
Chloride: 104 mmol/L (ref 98–111)
Creatinine, Ser: 0.98 mg/dL (ref 0.61–1.24)
GFR calc Af Amer: >60 mL/min (ref >60)
GFR calc non Af Amer: >60 mL/min (ref >60)
Glucose, Bld: 83 mg/dL (ref 70–99)
Potassium: 4.1 mmol/L (ref 3.5–5.1)
SODIUM: 138 mmol/L (ref 135–145)

## 2018-10-20 LAB — TROPONIN I: Troponin I: <0.03 ng/mL

## 2018-10-20 MED ORDER — KETOROLAC TROMETHAMINE 60 MG/2ML IM SOLN
30.0000 mg | Freq: Once | INTRAMUSCULAR | Status: AC
  Administered 2018-10-20: 30 mg via INTRAMUSCULAR
  Filled 2018-10-20: qty 2

## 2018-10-20 NOTE — ED Provider Notes (Signed)
[facility] EMERGENCY DEPARTMENT Provider Note   CSN: [id number] Arrival date & time: 10/20/18  1602     History   Chief Complaint Chief Complaint  Patient presents with  . Chest Pain  . headache  . back    HPI Bryan Davies is a 67 y.o. male.  Patient is a 67 year old male who has multiple complaints.  He complains of pain in the center of his chest.  It is worse when he coughs.  He also has pain in his right shoulder.  He has pain in his mid back.  He also has intermittent headaches.  All these complaints seem to have been going on for at least several months.  This includes the chest pain.  He does report that anything is different today he just wanted to try to figure out why he is having so much pain.  I cannot really elicit any 1 thing that is bothering him more than the other.  He denies any shortness of breath.  No abdominal pain.  He has some intermittent nausea and vomiting which he says that he has because his teeth are bad he cannot chew his food all the way so it chokes him.  He denies any other types of vomiting.  No diarrhea.  No fevers.  No URI symptoms.  No leg swelling.  He takes over-the-counter pain medications with some improvement in symptoms.  He is followed by a PCP, Dr. Katrinka Blazing.     Past Medical History:  Diagnosis Date  . Anxiety   . Hypertension   . Shingles 2013  . Shoulder pain     Patient Active Problem List   Diagnosis Date Noted  . Left shoulder pain 12/05/2015  . Urinary hesitancy 12/05/2015  . Pain in lower jaw 07/11/2015  . Hyperlipidemia 02/11/2014  . 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 Dyshydrosis 07/26/2013  . 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 Hypertension 05/21/2012  . Post herpetic neuralgia 05/21/2012  . Bipolar disorder (HCC) 05/21/2012  . Tobacco abuse 05/21/2012    History reviewed. No pertinent surgical history.      Home Medications    Prior to Admission medications   Medication Sig  Start Date End Date Taking? Authorizing Provider  acetaminophen (TYLENOL) 500 MG tablet Take 1,000 mg by mouth every 6 (six) hours as needed for mild pain or headache.    Yes [provider]  amLODipine (NORVASC) 5 MG tablet Take 1 tablet (5 mg total) by mouth daily. Patient not taking: Reported on 10/20/2018 07/25/16   Garth Bigness, MD  atorvastatin (LIPITOR) 40 MG tablet TAKE 1 TABLET BY MOUTH EVERY DAY Patient not taking: Reported on 01/25/2015 02/08/14   Lonia Skinner, MD  cyclobenzaprine (FLEXERIL) 10 MG tablet Take 1 tablet (10 mg total) by mouth 2 (two) times daily as needed for muscle spasms. Patient not taking: Reported on 01/25/2015 11/25/14   Roxy Horseman, PA-C  gabapentin (NEURONTIN) 300 MG capsule TAKE 2 CAPSULE BY MOUTH THREE TIMES DAILY Patient not taking: Reported on 10/20/2018 07/26/16   Garth Bigness, MD  hydrOXYzine (ATARAX/VISTARIL) 25 MG tablet Take 1 tablet (25 mg total) by mouth every 6 (six) hours as needed. Patient not taking: Reported on 10/20/2018 05/19/15   Elwin Mocha, MD  meloxicam (MOBIC) 15 MG tablet TAKE 1 TABLET BY MOUTH DAILY AS NEEDED FOR PAIN Patient not taking: Reported on 01/25/2015 07/14/14   Myra Rude, MD  tamsulosin (FLOMAX) 0.4 MG CAPS capsule Take 1 capsule (  0.4 mg total) by mouth daily. Patient not taking: Reported on 10/20/2018 07/25/16   Garth Bignessimberlake, Kathryn, MD    Family History No family history on file.  Social History Social History   Tobacco Use  . Smoking status: Current Every Day Smoker    Packs/day: 0.50    Years: 50.00    Pack years: 25.00    Types: Cigarettes  . Smokeless tobacco: Never Used  . Tobacco comment: 10  cigarettes per day, waits an hour between  Substance Use Topics  . Alcohol use: No  . Drug use: No     Allergies   Patient has no known allergies.   Review of Systems Review of Systems  Constitutional: Negative for chills, diaphoresis, fatigue and fever.  HENT: Negative for  congestion, rhinorrhea and sneezing.   Eyes: Negative.   Respiratory: Negative for cough, chest tightness and shortness of breath.   Cardiovascular: Positive for chest pain. Negative for leg swelling.  Gastrointestinal: Positive for vomiting. Negative for abdominal pain, blood in stool, diarrhea and nausea.  Genitourinary: Negative for difficulty urinating, flank pain, frequency and hematuria.  Musculoskeletal: Positive for arthralgias and back pain.  Skin: Negative for rash.  Neurological: Positive for headaches. Negative for dizziness, speech difficulty, weakness and numbness.     Physical Exam Updated Vital Signs BP (!) 152/76   Pulse 89   Temp 98.3 F (36.8 C) (Oral)   Resp (!) 21   Ht 5\' 7"  (1.702 m)   Wt 81.6 kg   SpO2 94%   BMI 28.19 kg/m   Physical Exam Constitutional:      Appearance: He is well-developed.  HENT:     Head: Normocephalic and atraumatic.  Eyes:     Pupils: Pupils are equal, round, and reactive to light.  Neck:     Musculoskeletal: Normal range of motion and neck supple.  Cardiovascular:     Rate and Rhythm: Normal rate and regular rhythm.     Heart sounds: Murmur present.  Pulmonary:     Effort: Pulmonary effort is normal. No respiratory distress.     Breath sounds: Normal breath sounds. No wheezing or rales.  Chest:     Chest wall: Tenderness present.  Abdominal:     General: Bowel sounds are normal.     Palpations: Abdomen is soft.     Tenderness: There is no abdominal tenderness. There is no guarding or rebound.  Musculoskeletal: Normal range of motion.  Lymphadenopathy:     Cervical: No cervical adenopathy.  Skin:    General: Skin is warm and dry.     Findings: No rash.  Neurological:     Mental Status: He is alert and oriented to person, place, and time.     Comments: Motor 5/5 all extremities Sensation grossly intact to LT all extremities Finger to Nose intact, no pronator drift CN II-XII grossly intact       ED Treatments /  Results  Labs (all labs ordered are listed, but only abnormal results are displayed) Labs Reviewed  BASIC METABOLIC PANEL - Abnormal; Notable for the following components:      Result Value   Calcium 8.8 (*)    All other components within normal limits  CBC  TROPONIN I    EKG EKG Interpretation  Date/Time:  Monday October 20 2018 16:22:00 EST Ventricular Rate:  101 PR Interval:  134 QRS Duration: 84 QT Interval:  348 QTC Calculation: 451 R Axis:   -16 Text Interpretation:  Sinus tachycardia Possible Left  atrial enlargement Borderline ECG since last tracing no significant change Confirmed by Rolan BuccoBelfi, Kaneisha Ellenberger [phone]([id number]) on 10/20/2018 11:41:54 PM   Radiology Dg Chest 2 View  Result Date: 10/20/2018 CLINICAL DATA:  Chest and back pain with headache. EXAM: CHEST - 2 VIEW COMPARISON:  Radiographs 01/26/2015 and 11/25/2014. FINDINGS: The heart size and mediastinal contours are stable. There is aortic atherosclerosis. There is stable chronic lung disease with central airway and interstitial thickening. No confluent airspace opacity, pleural effusion or pneumothorax. The bones appear unchanged. IMPRESSION: Stable chest without evidence of acute cardiopulmonary process. Electronically Signed   By: Carey BullocksWilliam  Veazey M.D.   On: 10/20/2018 17:12   Ct Head Wo Contrast  Result Date: 10/20/2018 CLINICAL DATA:  Headaches EXAM: CT HEAD WITHOUT CONTRAST TECHNIQUE: Contiguous axial images were obtained from the base of the skull through the vertex without intravenous contrast. COMPARISON:  05/19/2015 FINDINGS: Brain: Mild atrophic changes and chronic white matter ischemic change is noted. No findings to suggest acute hemorrhage, acute infarction or space-occupying mass lesion are noted. Vascular: No hyperdense vessel or unexpected calcification. Skull: Normal. Negative for fracture or focal lesion. Sinuses/Orbits: No acute finding. Other: None. IMPRESSION: Chronic atrophic and ischemic changes without acute  intracranial abnormality. Electronically Signed   By: Alcide CleverMark  Lukens M.D.   On: 10/20/2018 22:28    Procedures Procedures (including critical care time)  Medications Ordered in ED Medications  ketorolac (TORADOL) injection 30 mg (30 mg Intramuscular Given 10/20/18 2127)     Initial Impression / Assessment and Plan / ED Course  I have reviewed the triage vital signs and the nursing notes.  Pertinent labs & imaging results that were available during my care of the patient were reviewed by me and considered in my medical decision making (see chart for details).     Patient is a 67 year old male who presents with various complaints.  They seem to be mostly chronic in nature.  He has chest pain which is reproducible.  He states this is been hurting for a couple months.  He has no ischemic changes on EKG.  Normal troponin.  It does not sound cardiac in nature.  He has no associated symptoms.  He also has some back pain and shoulder pain and headaches which seem to be chronic as well.  He reported worsening of his headache pattern and therefore a CT was performed which shows no acute abnormality.  He is neurologically intact.  His other labs are non-concerning.  His chest x-ray is clear without evidence of pneumonia.  He was discharged home in good condition.  He will follow-up with his PCP at the family medicine center.  Final Clinical Impressions(s) / ED Diagnoses   Final diagnoses:  Atypical chest pain  Chronic midline low back pain without sciatica  Nonintractable headache, unspecified chronicity pattern, unspecified headache type    ED Discharge Orders    None       Rolan BuccoBelfi, Robbert Langlinais, MD 10/20/18 2343

## 2018-10-20 NOTE — ED Triage Notes (Signed)
The pt is c/o pain in his chest headache and back pain for 2 moths  No distress at present

## 2019-01-15 ENCOUNTER — Inpatient Hospital Stay (HOSPITAL_COMMUNITY): Payer: Medicare PPO | Admitting: Certified Registered"

## 2019-01-15 ENCOUNTER — Emergency Department (HOSPITAL_COMMUNITY): Payer: Medicare PPO

## 2019-01-15 ENCOUNTER — Encounter (HOSPITAL_COMMUNITY)
Admission: EM | Disposition: A | Discharge status: Home Health | Payer: Self-pay | Source: Home / Self Care | Attending: Neurology

## 2019-01-15 ENCOUNTER — Other Ambulatory Visit: Payer: Self-pay

## 2019-01-15 ENCOUNTER — Inpatient Hospital Stay (HOSPITAL_COMMUNITY): Payer: Medicare PPO

## 2019-01-15 ENCOUNTER — Encounter (HOSPITAL_COMMUNITY): Payer: Self-pay

## 2019-01-15 ENCOUNTER — Inpatient Hospital Stay (HOSPITAL_COMMUNITY)
Admission: EM | Admit: 2019-01-15 | Discharge: 2019-01-18 | DRG: 035 | Disposition: A | Discharge status: Home Health | Payer: Medicare PPO | Attending: Neurology | Admitting: Neurology

## 2019-01-15 DIAGNOSIS — R29707 NIHSS score 7: Secondary | ICD-10-CM | POA: Diagnosis present

## 2019-01-15 DIAGNOSIS — R339 Retention of urine, unspecified: Secondary | ICD-10-CM | POA: Diagnosis not present

## 2019-01-15 DIAGNOSIS — Z79899 Other long term (current) drug therapy: Secondary | ICD-10-CM

## 2019-01-15 DIAGNOSIS — I69351 Hemiplegia and hemiparesis following cerebral infarction affecting right dominant side: Secondary | ICD-10-CM

## 2019-01-15 DIAGNOSIS — R29810 Facial weakness: Secondary | ICD-10-CM | POA: Diagnosis present

## 2019-01-15 DIAGNOSIS — E785 Hyperlipidemia, unspecified: Secondary | ICD-10-CM | POA: Diagnosis present

## 2019-01-15 DIAGNOSIS — E876 Hypokalemia: Secondary | ICD-10-CM | POA: Diagnosis not present

## 2019-01-15 DIAGNOSIS — I10 Essential (primary) hypertension: Secondary | ICD-10-CM | POA: Diagnosis present

## 2019-01-15 DIAGNOSIS — F419 Anxiety disorder, unspecified: Secondary | ICD-10-CM | POA: Diagnosis present

## 2019-01-15 DIAGNOSIS — F319 Bipolar disorder, unspecified: Secondary | ICD-10-CM | POA: Diagnosis present

## 2019-01-15 DIAGNOSIS — J449 Chronic obstructive pulmonary disease, unspecified: Secondary | ICD-10-CM | POA: Diagnosis present

## 2019-01-15 DIAGNOSIS — I6601 Occlusion and stenosis of right middle cerebral artery: Secondary | ICD-10-CM

## 2019-01-15 DIAGNOSIS — I6521 Occlusion and stenosis of right carotid artery: Secondary | ICD-10-CM | POA: Diagnosis present

## 2019-01-15 DIAGNOSIS — I63511 Cerebral infarction due to unspecified occlusion or stenosis of right middle cerebral artery: Principal | ICD-10-CM | POA: Diagnosis present

## 2019-01-15 DIAGNOSIS — D72829 Elevated white blood cell count, unspecified: Secondary | ICD-10-CM

## 2019-01-15 DIAGNOSIS — Z23 Encounter for immunization: Secondary | ICD-10-CM | POA: Diagnosis present

## 2019-01-15 DIAGNOSIS — F17210 Nicotine dependence, cigarettes, uncomplicated: Secondary | ICD-10-CM | POA: Diagnosis present

## 2019-01-15 DIAGNOSIS — Z888 Allergy status to other drugs, medicaments and biological substances status: Secondary | ICD-10-CM

## 2019-01-15 DIAGNOSIS — R270 Ataxia, unspecified: Secondary | ICD-10-CM | POA: Diagnosis present

## 2019-01-15 DIAGNOSIS — R451 Restlessness and agitation: Secondary | ICD-10-CM | POA: Diagnosis not present

## 2019-01-15 DIAGNOSIS — R4781 Slurred speech: Secondary | ICD-10-CM | POA: Diagnosis present

## 2019-01-15 DIAGNOSIS — R0602 Shortness of breath: Secondary | ICD-10-CM | POA: Diagnosis not present

## 2019-01-15 DIAGNOSIS — I639 Cerebral infarction, unspecified: Secondary | ICD-10-CM | POA: Diagnosis not present

## 2019-01-15 DIAGNOSIS — F17200 Nicotine dependence, unspecified, uncomplicated: Secondary | ICD-10-CM | POA: Diagnosis not present

## 2019-01-15 DIAGNOSIS — I63231 Cerebral infarction due to unspecified occlusion or stenosis of right carotid arteries: Secondary | ICD-10-CM | POA: Diagnosis not present

## 2019-01-15 DIAGNOSIS — R9082 White matter disease, unspecified: Secondary | ICD-10-CM | POA: Diagnosis present

## 2019-01-15 HISTORY — PX: RADIOLOGY WITH ANESTHESIA: SHX6223

## 2019-01-15 LAB — URINALYSIS, ROUTINE W REFLEX MICROSCOPIC
Bilirubin Urine: NEGATIVE
Glucose, UA: NEGATIVE mg/dL
Hgb urine dipstick: NEGATIVE
KETONES UR: NEGATIVE mg/dL
Leukocytes,Ua: NEGATIVE
Nitrite: NEGATIVE
PH: 5 (ref 5.0–8.0)
Protein, ur: NEGATIVE mg/dL
Specific Gravity, Urine: 1.017 (ref 1.005–1.030)

## 2019-01-15 LAB — RAPID URINE DRUG SCREEN, HOSP PERFORMED
Amphetamines: NOT DETECTED
BENZODIAZEPINES: NOT DETECTED
Barbiturates: NOT DETECTED
Cocaine: NOT DETECTED
Opiates: NOT DETECTED
Tetrahydrocannabinol: NOT DETECTED

## 2019-01-15 LAB — CBG MONITORING, ED: Glucose-Capillary: 129 mg/dL — ABNORMAL HIGH (ref 70–99)

## 2019-01-15 SURGERY — IR WITH ANESTHESIA
Anesthesia: General

## 2019-01-15 MED ORDER — IOHEXOL 350 MG/ML SOLN
75.0000 mL | Freq: Once | INTRAVENOUS | Status: AC | PRN
  Administered 2019-01-15: 75 mL via INTRAVENOUS

## 2019-01-15 MED ORDER — IOPAMIDOL (ISOVUE-370) INJECTION 76%
40.0000 mL | Freq: Once | INTRAVENOUS | Status: AC | PRN
  Administered 2019-01-15: 40 mL via INTRAVENOUS

## 2019-01-15 MED ORDER — SUCCINYLCHOLINE CHLORIDE 200 MG/10ML IV SOSY
PREFILLED_SYRINGE | INTRAVENOUS | Status: DC | PRN
  Administered 2019-01-15: 80 mg via INTRAVENOUS

## 2019-01-15 MED ORDER — IOPAMIDOL (ISOVUE-370) INJECTION 76%
INTRAVENOUS | Status: AC
  Filled 2019-01-15: qty 50

## 2019-01-15 MED ORDER — SODIUM CHLORIDE 0.9 % IV SOLN: 50.0000 mL | Freq: Once | INTRAVENOUS | Status: DC

## 2019-01-15 MED ORDER — ASPIRIN 81 MG PO CHEW
CHEWABLE_TABLET | ORAL | Status: AC
  Administered 2019-01-15: 81 mg via OROGASTRIC
  Filled 2019-01-15: qty 1

## 2019-01-15 MED ORDER — LIDOCAINE 2% (20 MG/ML) 5 ML SYRINGE
INTRAMUSCULAR | Status: DC | PRN
  Administered 2019-01-15: 60 mg via INTRAVENOUS

## 2019-01-15 MED ORDER — NITROGLYCERIN 1 MG/10 ML FOR IR/CATH LAB
INTRA_ARTERIAL | Status: AC
  Filled 2019-01-15: qty 10

## 2019-01-15 MED ORDER — PANTOPRAZOLE SODIUM 40 MG IV SOLR
40.0000 mg | Freq: Every day | INTRAVENOUS | Status: DC
  Administered 2019-01-16: 40 mg via INTRAVENOUS
  Filled 2019-01-15: qty 40

## 2019-01-15 MED ORDER — EPTIFIBATIDE 20 MG/10ML IV SOLN
INTRAVENOUS | Status: AC
  Administered 2019-01-16: 3 mg via INTRA_ARTERIAL
  Filled 2019-01-15: qty 10

## 2019-01-15 MED ORDER — ASPIRIN 325 MG PO TABS
ORAL_TABLET | ORAL | Status: AC
  Filled 2019-01-15: qty 1

## 2019-01-15 MED ORDER — SODIUM CHLORIDE 0.9 % IV SOLN
INTRAVENOUS | Status: DC | PRN
  Administered 2019-01-15: via INTRAVENOUS

## 2019-01-15 MED ORDER — TIROFIBAN HCL IN NACL 5-0.9 MG/100ML-% IV SOLN
INTRAVENOUS | Status: AC
  Filled 2019-01-15: qty 100

## 2019-01-15 MED ORDER — FENTANYL CITRATE (PF) 100 MCG/2ML IJ SOLN
INTRAMUSCULAR | Status: DC | PRN
  Administered 2019-01-15: 100 ug via INTRAVENOUS

## 2019-01-15 MED ORDER — ACETAMINOPHEN 325 MG PO TABS
650.0000 mg | ORAL_TABLET | ORAL | Status: DC | PRN
  Administered 2019-01-16 (×2): 325 mg via ORAL
  Administered 2019-01-17: 650 mg via ORAL
  Filled 2019-01-15 (×3): qty 2

## 2019-01-15 MED ORDER — CEFAZOLIN SODIUM-DEXTROSE 2-3 GM-%(50ML) IV SOLR
INTRAVENOUS | Status: DC | PRN
  Administered 2019-01-15: 2 g via INTRAVENOUS

## 2019-01-15 MED ORDER — PROPOFOL 10 MG/ML IV BOLUS
INTRAVENOUS | Status: DC | PRN
  Administered 2019-01-15: 120 mg via INTRAVENOUS

## 2019-01-15 MED ORDER — STROKE: EARLY STAGES OF RECOVERY BOOK
Freq: Once | Status: AC
  Administered 2019-01-16: 1

## 2019-01-15 MED ORDER — FENTANYL CITRATE (PF) 100 MCG/2ML IJ SOLN
INTRAMUSCULAR | Status: AC
  Filled 2019-01-15: qty 2

## 2019-01-15 MED ORDER — TICAGRELOR 90 MG PO TABS
ORAL_TABLET | ORAL | Status: AC
  Administered 2019-01-15: 180 mg via OROGASTRIC
  Filled 2019-01-15: qty 2

## 2019-01-15 MED ORDER — ROCURONIUM BROMIDE 50 MG/5ML IV SOSY
PREFILLED_SYRINGE | INTRAVENOUS | Status: DC | PRN
  Administered 2019-01-15: 50 mg via INTRAVENOUS
  Administered 2019-01-16: 20 mg via INTRAVENOUS

## 2019-01-15 MED ORDER — ACETAMINOPHEN 650 MG RE SUPP: 650.0000 mg | RECTAL | Status: DC | PRN

## 2019-01-15 MED ORDER — CEFAZOLIN SODIUM-DEXTROSE 2-4 GM/100ML-% IV SOLN
INTRAVENOUS | Status: AC
  Filled 2019-01-15: qty 100

## 2019-01-15 MED ORDER — CLOPIDOGREL BISULFATE 300 MG PO TABS
ORAL_TABLET | ORAL | Status: AC
  Filled 2019-01-15: qty 1

## 2019-01-15 MED ORDER — SODIUM CHLORIDE 0.9 % IV SOLN
INTRAVENOUS | Status: DC
  Administered 2019-01-15: 21:00:00 via INTRAVENOUS

## 2019-01-15 MED ORDER — ACETAMINOPHEN 160 MG/5ML PO SOLN: 650.0000 mg | ORAL | Status: DC | PRN

## 2019-01-15 MED ORDER — ALTEPLASE (STROKE) FULL DOSE INFUSION
0.9000 mg/kg | Freq: Once | INTRAVENOUS | Status: AC
  Administered 2019-01-15: 66.5 mg via INTRAVENOUS
  Filled 2019-01-15: qty 100

## 2019-01-15 NOTE — Progress Notes (Signed)
Before obtaining CT Perfusion, Dr. Laurence Slate notified of NIH 6 and new onset of headache rated 7/10. CT Head ordered prior to perfusion study. Waited in CT for Dr. Laurence Slate to read scans. Notified from Dr. Laurence Slate to take patient to IR suite and wait for IR team.

## 2019-01-15 NOTE — Anesthesia Preprocedure Evaluation (Signed)
Anesthesia Evaluation  Patient identified by MRN, date of birth, ID band Patient awake    Reviewed: Allergy & Precautions, H&P , NPO status , Patient's Chart, lab work & pertinent test results  Airway Mallampati: II  TM Distance: >3 FB Neck ROM: Full    Dental no notable dental hx. (+) Teeth Intact, Dental Advisory Given   Pulmonary Current Smoker,    Pulmonary exam normal breath sounds clear to auscultation       Cardiovascular hypertension, Pt. on medications  Rhythm:Regular Rate:Normal     Neuro/Psych Anxiety Bipolar Disorder CVA, Residual Symptoms    GI/Hepatic negative GI ROS, Neg liver ROS,   Endo/Other  negative endocrine ROS  Renal/GU negative Renal ROS  negative genitourinary   Musculoskeletal   Abdominal   Peds  Hematology negative hematology ROS (+)   Anesthesia Other Findings   Reproductive/Obstetrics negative OB ROS                             Anesthesia Physical Anesthesia Plan  ASA: III and emergent  Anesthesia Plan: General   Post-op Pain Management:    Induction: Intravenous  PONV Risk Score and Plan: 1 and Ondansetron  Airway Management Planned: Oral ETT  Additional Equipment:   Intra-op Plan:   Post-operative Plan: Possible Post-op intubation/ventilation  Informed Consent: I have reviewed the patients History and Physical, chart, labs and discussed the procedure including the risks, benefits and alternatives for the proposed anesthesia with the patient or authorized representative who has indicated his/her understanding and acceptance.     Dental advisory given  Plan Discussed with: CRNA  Anesthesia Plan Comments:         Anesthesia Quick Evaluation

## 2019-01-15 NOTE — Anesthesia Procedure Notes (Signed)
Procedure Name: Intubation Date/Time: 01/15/2019 11:39 PM Performed by: Babs Bertin, CRNA Pre-anesthesia Checklist: Patient identified, Emergency Drugs available, Suction available and Patient being monitored Patient Re-evaluated:Patient Re-evaluated prior to induction Oxygen Delivery Method: Circle System Utilized Preoxygenation: Pre-oxygenation with 100% oxygen Induction Type: IV induction and Rapid sequence Laryngoscope Size: Mac and 3 Grade View: Grade I Tube type: Oral Tube size: 7.5 mm Number of attempts: 1 Airway Equipment and Method: Stylet and Oral airway Placement Confirmation: ETT inserted through vocal cords under direct vision,  positive ETCO2 and breath sounds checked- equal and bilateral Secured at: 22 cm Tube secured with: Tape Dental Injury: Teeth and Oropharynx as per pre-operative assessment

## 2019-01-15 NOTE — H&P (Signed)
Chief Complaint: Confusion, loss of consciousness   History obtained from: Patient and Chart    HPI:                                                                                                                                       Bryan Davies is an 68 y.o. male  With PMH of HTN, COPD, HLD, tobacco abuse, bipolar disorder presents to the emergency department after having a passing out episode.  Last known normal was around 5:15 PM.  Patient went to visit his daughter with his girlfriend who has been sick with a cold.  While he was there, smoking a cigarette he suddenly felt sleepy and then later the patient passed out and he was noted to have low pulse.  She started CPR and his pulse return but patient was still unresponsive.  He had an episode where he appeared to stiffen before he passed out.  EMS was called and patient was brought to the ER for syncope.  While waiting in the emergency department, the ED provider noticed patient had a facial droop, drift in the left upper extremity and stroke alerted the patient. She felt there may have been some neglect, however not obvious on my assessment.  Patient's NIH stroke scale was 7 mainly for ataxia, drift, slurred speech and facial droop.  Patient received IV TPA after CT head was negative for hemorrhage.  Stat CT angiogram was performed which showed patient had an occlusion in the right ICA at the origin with reconstitution in the cavernous portion, and paucity of flow to the right MCA without clear cut off.  Upon reassessing the patient after CT angiogram-NIHSS had improved to 3.  Discussed with neuro interventional neurologist, and since patient symptoms were mild we decided to proceed with a diagnostic angiogram and possible stenting for tomorrow morning.  However, patient symptoms worsened and it was decided to take the patient for emergent carotid stenting.  Date last known well: 3.19.20 Time last known well: 5.15pm tPA Given:  yes NIHSS: 7 Baseline MRS 0     Past Medical History:  Diagnosis Date  . Anxiety   . Hypertension   . Shingles 2013  . Shoulder pain     History reviewed. No pertinent surgical history.  History reviewed. No pertinent family history. Social History:  reports that he has been smoking cigarettes. He has a 25.00 pack-year smoking history. He has never used smokeless tobacco. He reports that he does not drink alcohol or use drugs.  Allergies: No Known Allergies  Medications:  I reviewed home medications.   ROS:                                                                                                                                     14 systems reviewed and negative except above and chronic cough from COPD.    Examination:                                                                                                      General: Appears well-developed  Psych: Affect appropriate to situation Eyes: No scleral injection HENT: No OP obstrucion Head: Normocephalic.  Cardiovascular: Normal rate and regular rhythm.  Respiratory: Effort normal and breath sounds normal to anterior ascultation GI: Soft.  No distension. There is no tenderness.  Skin: WDI    Neurological Examination Mental Status: Awake but slightly somnolent, oriented, thought content appropriate.  . Able to follow 3 step commands without difficulty.  Slurred speech. Cranial Nerves: II: Visual fields grossly normal,  III,IV, VI: ptosis not present, extra-ocular motions intact bilaterally, pupils equal, round, reactive to light and accommodation V,VII: Left facial droop, facial light touch sensation normal bilaterally VIII: hearing normal bilaterally IX,X: uvula rises symmetrically XI: bilateral shoulder shrug XII: midline tongue extension Motor: Right : Upper extremity   5/5    Left:      Upper extremity   4+/5  Lower extremity   5/5     Lower extremity   5/5 Tone and bulk:normal tone throughout; no atrophy noted Sensory: Pinprick and light touch intact throughout, bilaterally Deep Tendon Reflexes: 2+ and symmetric throughout Plantars: Right: downgoing   Left: downgoing Cerebellar: Dysmetria in the left upper extremity and impaired heel-to-shin in the left lower extremity Gait: Not Assessed.     Lab Results: Basic Metabolic Panel: No results for input(s): NA, K, CL, CO2, GLUCOSE, BUN, CREATININE, CALCIUM, MG, PHOS in the last 168 hours.  CBC: No results for input(s): WBC, NEUTROABS, HGB, HCT, MCV, PLT in the last 168 hours.  Coagulation Studies: No results for input(s): LABPROT, INR in the last 72 hours.  Imaging: Ct Angio Head W Or Wo Contrast  Result Date: 01/15/2019 CLINICAL DATA:  Stroke follow-up. EXAM: CT ANGIOGRAPHY HEAD AND NECK TECHNIQUE: Multidetector CT imaging of the head and neck was performed using the standard protocol during bolus administration of intravenous contrast. Multiplanar CT image reconstructions and MIPs were obtained to evaluate the vascular anatomy. Carotid stenosis measurements (when applicable) are obtained utilizing NASCET criteria, using the distal internal  carotid diameter as the denominator. CONTRAST:  37mL OMNIPAQUE IOHEXOL 350 MG/ML SOLN COMPARISON:  Head CT 01/15/2019 FINDINGS: CTA NECK FINDINGS SKELETON: There is no bony spinal canal stenosis. No lytic or blastic lesion. OTHER NECK: Normal pharynx, larynx and major salivary glands. No cervical lymphadenopathy. Unremarkable thyroid gland. UPPER CHEST: No pneumothorax or pleural effusion. No nodules or masses. AORTIC ARCH: There is no calcific atherosclerosis of the aortic arch. There is no aneurysm, dissection or hemodynamically significant stenosis of the visualized ascending aorta and aortic arch. Conventional 3 vessel aortic branching pattern. There is atherosclerotic plaque within  the proximal arch vessels. No high-grade stenosis. RIGHT CAROTID SYSTEM: --Common carotid artery: There is predominantly noncalcified plaque within the distal right common carotid artery causing approximately 60% stenosis. --Internal carotid artery: Occluded at its origin. There is return of opacification at the proximal petrous segment. --External carotid artery: No acute abnormality. LEFT CAROTID SYSTEM: --Common carotid artery: Widely patent origin without common carotid artery dissection or aneurysm. --Internal carotid artery: No dissection, occlusion or aneurysm. Mild atherosclerotic disease at the carotid bifurcation without hemodynamically significant stenosis. --External carotid artery: No acute abnormality. VERTEBRAL ARTERIES: Right dominant configuration. Both origins are normal. No dissection, occlusion or flow-limiting stenosis to the vertebrobasilar confluence. CTA HEAD FINDINGS POSTERIOR CIRCULATION: --Vertebral arteries: Normal right dominant configuration of V4 segments. --Posterior inferior cerebellar arteries (PICA): Patent origins from the vertebral arteries. --Anterior inferior cerebellar arteries (AICA): Patent origins from the basilar artery. --Basilar artery: Normal. --Superior cerebellar arteries: Normal. --Posterior cerebral arteries (PCA): Normal. Both originate from the basilar artery. Posterior communicating arteries (p-comm) are diminutive or absent. ANTERIOR CIRCULATION: --Intracranial internal carotid arteries: There is diminished opacification of the right ICA at the skull base. Atherosclerotic calcification of the left ICA. --Anterior cerebral arteries (ACA): Normal. Both A1 segments are present. Patent anterior communicating artery (a-comm). --Middle cerebral arteries (MCA): Normal. VENOUS SINUSES: As permitted by contrast timing, patent. ANATOMIC VARIANTS: None DELAYED PHASE: Not performed. Review of the MIP images confirms the above findings. IMPRESSION: 1. Occlusion of the right  internal carotid artery at its origin, likely acute in the setting of reported left-sided symptoms. 2. The right MCA is patent, but there is diminished enhancement within the right MCA territory, possibly secondary to occlusion of small distal branches. 3. Critical Value/emergent results were called by telephone at the time of interpretation on 01/15/2019 at 8:54 pm to Dr. Arther Dames , who verbally acknowledged these results. Electronically Signed   By: Deatra Robinson M.D.   On: 01/15/2019 20:55   Ct Angio Neck W Or Wo Contrast  Result Date: 01/15/2019 CLINICAL DATA:  Stroke follow-up. EXAM: CT ANGIOGRAPHY HEAD AND NECK TECHNIQUE: Multidetector CT imaging of the head and neck was performed using the standard protocol during bolus administration of intravenous contrast. Multiplanar CT image reconstructions and MIPs were obtained to evaluate the vascular anatomy. Carotid stenosis measurements (when applicable) are obtained utilizing NASCET criteria, using the distal internal carotid diameter as the denominator. CONTRAST:  16mL OMNIPAQUE IOHEXOL 350 MG/ML SOLN COMPARISON:  Head CT 01/15/2019 FINDINGS: CTA NECK FINDINGS SKELETON: There is no bony spinal canal stenosis. No lytic or blastic lesion. OTHER NECK: Normal pharynx, larynx and major salivary glands. No cervical lymphadenopathy. Unremarkable thyroid gland. UPPER CHEST: No pneumothorax or pleural effusion. No nodules or masses. AORTIC ARCH: There is no calcific atherosclerosis of the aortic arch. There is no aneurysm, dissection or hemodynamically significant stenosis of the visualized ascending aorta and aortic arch. Conventional 3 vessel aortic branching pattern. There is  atherosclerotic plaque within the proximal arch vessels. No high-grade stenosis. RIGHT CAROTID SYSTEM: --Common carotid artery: There is predominantly noncalcified plaque within the distal right common carotid artery causing approximately 60% stenosis. --Internal carotid artery: Occluded  at its origin. There is return of opacification at the proximal petrous segment. --External carotid artery: No acute abnormality. LEFT CAROTID SYSTEM: --Common carotid artery: Widely patent origin without common carotid artery dissection or aneurysm. --Internal carotid artery: No dissection, occlusion or aneurysm. Mild atherosclerotic disease at the carotid bifurcation without hemodynamically significant stenosis. --External carotid artery: No acute abnormality. VERTEBRAL ARTERIES: Right dominant configuration. Both origins are normal. No dissection, occlusion or flow-limiting stenosis to the vertebrobasilar confluence. CTA HEAD FINDINGS POSTERIOR CIRCULATION: --Vertebral arteries: Normal right dominant configuration of V4 segments. --Posterior inferior cerebellar arteries (PICA): Patent origins from the vertebral arteries. --Anterior inferior cerebellar arteries (AICA): Patent origins from the basilar artery. --Basilar artery: Normal. --Superior cerebellar arteries: Normal. --Posterior cerebral arteries (PCA): Normal. Both originate from the basilar artery. Posterior communicating arteries (p-comm) are diminutive or absent. ANTERIOR CIRCULATION: --Intracranial internal carotid arteries: There is diminished opacification of the right ICA at the skull base. Atherosclerotic calcification of the left ICA. --Anterior cerebral arteries (ACA): Normal. Both A1 segments are present. Patent anterior communicating artery (a-comm). --Middle cerebral arteries (MCA): Normal. VENOUS SINUSES: As permitted by contrast timing, patent. ANATOMIC VARIANTS: None DELAYED PHASE: Not performed. Review of the MIP images confirms the above findings. IMPRESSION: 1. 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 2. The right MCA is patent, but there is diminished enhancement within the right MCA territory, possibly secondary to occlusion of small distal branches. 3.  Critical Value/emergent results were called by telephone at the time of interpretation on 01/15/2019 at 8:54 pm to Dr. Arther Dames , who verbally acknowledged these results. Electronically Signed   By: Deatra Robinson M.D.   On: 01/15/2019 20:55   Ct Head Code Stroke Wo Contrast  Result Date: 01/15/2019 CLINICAL DATA:  Code stroke.  Left-sided weakness.  Stroke EXAM: CT HEAD WITHOUT CONTRAST TECHNIQUE: Contiguous axial images were obtained from the base of the skull through the vertex without intravenous contrast. COMPARISON:  CT head 10/20/2018 FINDINGS: Brain: Mild atrophy. Negative for acute infarct, hemorrhage, or mass. Vascular: Hyperdense right MCA compatible with acute thrombosis. This was not seen previously. No other hyperdense vessel. Skull: Negative Sinuses/Orbits: Negative Other: None ASPECTS ([place]berta Stroke Program Early CT Score) - Ganglionic level infarction (caudate, lentiform nuclei, internal capsule, insula, M1-M3 cortex): 7 - Supraganglionic infarction (M4-M6 cortex): 3 Total score (0-10 with 10 being normal): 10 IMPRESSION: 1. Hyperdense right MCA compatible with acute thrombosis. Negative for acute infarct or hemorrhage 2. ASPECTS is 10 3. These results were called by telephone at the time of interpretation on 01/15/2019 at 8:26 pm to Dr. Laurence Slate, who verbally acknowledged these results. Electronically Signed   By: Marlan Palau M.D.   On: 01/15/2019 20:27     ASSESSMENT AND PLAN   68 year old male with PMH of HTN, COPD, HLD, tobacco abuse, bipolar disorder presents to the ED after having episode of loss of consciousness.  While in the ED, patient noted to have slurred speech, facial droop and left-sided weakness and ataxia.  He received IV TPA.  CT angiogram revealed right ICA occlusion with positive blood flow in the right MCA territory without clear MCA branch occlusion.  Patient symptoms improved after IV TPA, however again worsened, now having neglect.  CT perfusion was obtained  which showed no core however 100 cc of penumbra.  Patient was taken to IR for emergent carotid stenting and possible mechanical thrombectomy if MCA branch was occluded.  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 status post TPA and emergent carotid stenting  Risk factors: HTN, COPD, HLD, tobacco abuse, Etiology: atherosclerotic disease    # MRI of the brain without contrast #Transthoracic Echo  #Aspirin and Brilinta given within 24-hour period of TPA as risk of stent thrombosis greater than hemorrhagic conversion  #Post IR CT head performed shows no hemorrhage, Repeat CT head in 24 hours #Start or continue Atorvastatin 80 mg/other high intensity statin # BP goal: Per neuro IR # HBAIC and Lipid profile # Telemetry monitoring # Frequent neuro checks #  stroke swallow screen    Hypertension Goal SBP is between 120-1 40, started on Cardene drip  COPD Nebs as needed  Tobacco abuse Smoking cessation  Hyperlipidemia Start high dose statin when patient passes swallow evaluation Lipidprofile ordered   Please page stroke NP  Or  PA  Or MD from 8am -4 pm  as this patient from this time will be  followed by the stroke.   You can look them up on [email or web address]  Password [email or web address]     This patient is neurologically critically ill due to stroke status post TPA and emergent carotid stenting.  He is at risk for significant risk of neurological worsening from cerebral edema,  death from brain herniation, heart failure, hemorrhagic conversion, infection, respiratory failure and seizure. This patient's care requires constant monitoring of vital signs, hemodynamics, respiratory and cardiac monitoring, review of multiple databases, neurological assessment, discussion with family, other specialists and medical decision making of high complexity.  I spent 90 minutes of neurocritical time in the care of this patient.     Sushanth Aroor [facility] Pager Number  [phone]

## 2019-01-15 NOTE — Significant Event (Signed)
Rapid Response Event Note   IR Activation Page 2249. I was with another patient in a medical emergency, I called [facility]ICU and per NS, patient was already on their way to IR.   Bryan Davies

## 2019-01-15 NOTE — ED Notes (Signed)
Per MD, did not send flu swab d/t pt not having respiratory symptoms

## 2019-01-15 NOTE — ED Triage Notes (Signed)
Pt was at girlfriends house and had syncopal episode for 2 minutes. Denies hitting head. Pt has had shortness of breath and cough today. Pt is current smoker. Pt stepdaughter was at a store and was sneezed on by someone and has had fever and cough ever since.

## 2019-01-15 NOTE — Progress Notes (Signed)
PHARMACIST CODE STROKE RESPONSE  Notified to mix tPA at 20:17 by Dr. Laurence Slate Delivered tPA to RN at 20:20  tPA dose = 6.7 mg bolus over 1 minute followed by 59.8 mg for a total dose of 66.5 mg over 1 hour  Issues/delays encountered (if applicable): none  Bryan Davies 01/15/19 8:31 PM

## 2019-01-15 NOTE — ED Provider Notes (Signed)
[facility] EMERGENCY DEPARTMENT Provider Note   CSN: [id number] Arrival date & time: 01/15/19  1742    History   Chief Complaint Chief Complaint  Patient presents with  . Shortness of Breath  . Loss of Consciousness  . Code Stroke    HPI Bryan Davies is a 68 y.o. male.     HPI Both had headache Went outside today to smoke cigarette, then he began to feel sleepy and girlfriend said she would drive him home. Went to go see daughter because she is sick with a "chest cold" although reports she has not had fever or cough. Then he passed out and daughter came out and pulse was low and heart stopped. Was trying to remember CPR.  Daughter went to school to become a paramedic and she was rubbing his chest and he came back before they started CPR. His pulse returned but he was still unconscious ---Was unconscious for at least 10 minutes per fiance, wet himself.  Fiance reports he appeared to stiffen some before his syncopal episode like he was going to have a seizure  He reports some abdominal pain on ROS. In triage reported dyspnea and cough today, but on my history reports chronic smokers cough, no shortness of breath, no chest pain.  Denies numbness, headache now although had one this morning. Denies new medications, stopping medications, has not used etoh in 25 years. 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   Past Medical History:  Diagnosis Date  . Anxiety   . Hypertension   . Shingles 2013  . Shoulder pain     Patient Active Problem List   Diagnosis Date Noted  . Ischemic stroke (HCC) 01/15/2019  . Left shoulder pain 12/05/2015  . Urinary hesitancy 12/05/2015  . Pain in lower jaw 07/11/2015  . Hyperlipidemia 02/11/2014  . 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 Dyshydrosis 07/26/2013  . 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 Hypertension 05/21/2012  . Post herpetic neuralgia 05/21/2012  . Bipolar disorder (HCC) 05/21/2012  .  Tobacco abuse 05/21/2012    History reviewed. No pertinent surgical history.      Home Medications    Prior to Admission medications   Medication Sig Start Date End Date Taking? Authorizing Provider  acetaminophen (TYLENOL) 500 MG tablet Take 1,000 mg by mouth every 6 (six) hours as needed for mild pain or headache.     [provider]  amLODipine (NORVASC) 5 MG tablet Take 1 tablet (5 mg total) by mouth daily. Patient not taking: Reported on 10/20/2018 07/25/16   Garth Bigness, MD  atorvastatin (LIPITOR) 40 MG tablet TAKE 1 TABLET BY MOUTH EVERY DAY Patient not taking: Reported on 01/25/2015 02/08/14   Lonia Skinner, MD  cyclobenzaprine (FLEXERIL) 10 MG tablet Take 1 tablet (10 mg total) by mouth 2 (two) times daily as needed for muscle spasms. Patient not taking: Reported on 01/25/2015 11/25/14   Roxy Horseman, PA-C  gabapentin (NEURONTIN) 300 MG capsule TAKE 2 CAPSULE BY MOUTH THREE TIMES DAILY Patient not taking: Reported on 10/20/2018 07/26/16   Garth Bigness, MD  hydrOXYzine (ATARAX/VISTARIL) 25 MG tablet Take 1 tablet (25 mg total) by mouth every 6 (six) hours as needed. Patient not taking: Reported on 10/20/2018 05/19/15   Elwin Mocha, MD  meloxicam (MOBIC) 15 MG tablet TAKE 1 TABLET BY MOUTH DAILY AS NEEDED FOR PAIN Patient not taking: Reported on 01/25/2015 07/14/14   Myra Rude, MD  tamsulosin [facility]) 0.4 MG  CAPS capsule Take 1 capsule (0.4 mg total) by mouth daily. Patient not taking: Reported on 10/20/2018 07/25/16   Garth Bigness, MD    Family History History reviewed. No pertinent family history.  Social History Social History   Tobacco Use  . Smoking status: Current Every Day Smoker    Packs/day: 0.50    Years: 50.00    Pack years: 25.00    Types: Cigarettes  . Smokeless tobacco: Never Used  . Tobacco comment: 10  cigarettes per day, waits an hour between  Substance Use Topics  . Alcohol use: No  . Drug use: No      Allergies   Patient has no known allergies.   Review of Systems Review of Systems  Unable to perform ROS: Acuity of condition  Constitutional: Positive for diaphoresis and fatigue. Negative for fever.  Eyes: Negative for visual disturbance.  Respiratory: Positive for cough. Negative for shortness of breath.   Cardiovascular: Negative for chest pain.  Gastrointestinal: Positive for abdominal pain and vomiting. Negative for blood in stool and nausea.  Skin: Positive for rash.  Neurological: Positive for syncope. Negative for weakness, numbness and headaches. Speech difficulty: reports he might be more slurred than usual southern drawl, pt sleepy.     Physical Exam Updated Vital Signs BP (!) 145/74   Pulse 82   Temp 98.1 F (36.7 C) (Oral)   Resp (!) 21   Ht  (1.702 m)   Wt 73.9 kg   SpO2 93%   BMI 25.53 kg/m   Physical Exam Vitals signs and nursing note reviewed.  Constitutional:      General: He is not in acute distress.    Appearance: He is well-developed. He is ill-appearing. He is not diaphoretic.  HENT:     Head: Normocephalic and atraumatic.  Eyes:     General: No visual field deficit.    Conjunctiva/sclera: Conjunctivae normal.  Neck:     Musculoskeletal: Normal range of motion.  Cardiovascular:     Rate and Rhythm: Normal rate and regular rhythm.     Heart sounds: Normal heart sounds. No murmur. No friction rub. No gallop.   Pulmonary:     Effort: Pulmonary effort is normal. No respiratory distress.     Breath sounds: Rhonchi present. No wheezing or rales.  Abdominal:     General: There is no distension.     Palpations: Abdomen is soft.     Tenderness: There is no abdominal tenderness. There is no guarding.  Genitourinary:    Rectum: Guaiac stool:    Skin:    General: Skin is warm and dry.  Neurological:     Mental Status: He is alert and oriented to person, place, and time.     Cranial Nerves: Dysarthria and facial asymmetry present. No  cranial nerve deficit.     Sensory: Sensation is intact. No sensory deficit.     Motor: Weakness (fluctuating left sided weakness, at times appears more severe, then immediate reexam improved then worsening again) and pronator drift present.     Coordination: Coordination abnormal. Finger-Nose-Finger Test abnormal. Heel to Shin Test normal.      ED Treatments / Results  Labs (all labs ordered are listed, but only abnormal results are displayed) Labs Reviewed  CBC - Abnormal; Notable for the following components:      Result Value   WBC 11.3 (*)    All other components within normal limits  DIFFERENTIAL - Abnormal; Notable for the following components:  Neutro Abs 9.7 (*)    Abs Immature Granulocytes 0.21 (*)    All other components within normal limits  COMPREHENSIVE METABOLIC PANEL - Abnormal; Notable for the following components:   CO2 21 (*)    Glucose, Bld 140 (*)    All other components within normal limits  URINALYSIS, ROUTINE W REFLEX MICROSCOPIC - Abnormal; Notable for the following components:   APPearance HAZY (*)    All other components within normal limits  CBG MONITORING, ED - Abnormal; Notable for the following components:   Glucose-Capillary 129 (*)    All other components within normal limits  MRSA PCR SCREENING  ETHANOL  PROTIME-INR  APTT  RAPID URINE DRUG SCREEN, HOSP PERFORMED  HIV ANTIBODY (ROUTINE TESTING W REFLEX)  HEMOGLOBIN A1C  LIPID PANEL  I-STAT CREATININE, ED    EKG EKG Interpretation  Date/Time:  Thursday January 15 2019 17:58:05 EDT Ventricular Rate:  86 PR Interval:    QRS Duration: 94 QT Interval:  403 QTC Calculation: 482 R Axis:   29 Text Interpretation:  Sinus rhythm Probable left atrial enlargement Borderline prolonged QT interval No significant change since last tracing Confirmed by Alvira Monday ([id number]) on 01/15/2019 7:35:40 PM   Radiology Ct Angio Head W Or Wo Contrast  Result Date: 01/15/2019 CLINICAL DATA:  Stroke  follow-up. EXAM: CT ANGIOGRAPHY HEAD AND NECK TECHNIQUE: Multidetector CT imaging of the head and neck was performed using the standard protocol during bolus administration of intravenous contrast. Multiplanar CT image reconstructions and MIPs were obtained to evaluate the vascular anatomy. Carotid stenosis measurements (when applicable) are obtained utilizing NASCET criteria, using the distal internal carotid diameter as the denominator. CONTRAST:  62mL OMNIPAQUE IOHEXOL 350 MG/ML SOLN COMPARISON:  Head CT 01/15/2019 FINDINGS: CTA NECK FINDINGS SKELETON: There is no bony spinal canal stenosis. No lytic or blastic lesion. OTHER NECK: Normal pharynx, larynx and major salivary glands. No cervical lymphadenopathy. Unremarkable thyroid gland. UPPER CHEST: No pneumothorax or pleural effusion. No nodules or masses. AORTIC ARCH: There is no calcific atherosclerosis of the aortic arch. There is no aneurysm, dissection or hemodynamically significant stenosis of the visualized ascending aorta and aortic arch. Conventional 3 vessel aortic branching pattern. There is atherosclerotic plaque within the proximal arch vessels. No high-grade stenosis. RIGHT CAROTID SYSTEM: --Common carotid artery: There is predominantly noncalcified plaque within the distal right common carotid artery causing approximately 60% stenosis. --Internal carotid artery: Occluded at its origin. There is return of opacification at the proximal petrous segment. --External carotid artery: No acute abnormality. LEFT CAROTID SYSTEM: --Common carotid artery: Widely patent origin without common carotid artery dissection or aneurysm. --Internal carotid artery: No dissection, occlusion or aneurysm. Mild atherosclerotic disease at the carotid bifurcation without hemodynamically significant stenosis. --External carotid artery: No acute abnormality. VERTEBRAL ARTERIES: Right dominant configuration. Both origins are normal. No dissection, occlusion or flow-limiting  stenosis to the vertebrobasilar confluence. CTA HEAD FINDINGS POSTERIOR CIRCULATION: --Vertebral arteries: Normal right dominant configuration of V4 segments. --Posterior inferior cerebellar arteries (PICA): Patent origins from the vertebral arteries. --Anterior inferior cerebellar arteries (AICA): Patent origins from the basilar artery. --Basilar artery: Normal. --Superior cerebellar arteries: Normal. --Posterior cerebral arteries (PCA): Normal. Both originate from the basilar artery. Posterior communicating arteries (p-comm) are diminutive or absent. ANTERIOR CIRCULATION: --Intracranial internal carotid arteries: There is diminished opacification of the right ICA at the skull base. Atherosclerotic calcification of the left ICA. --Anterior cerebral arteries (ACA): Normal. Both A1 segments are present. Patent anterior communicating artery (a-comm). --Middle cerebral arteries (MCA): Normal. VENOUS  SINUSES: As permitted by contrast timing, patent. ANATOMIC VARIANTS: None DELAYED PHASE: Not performed. Review of the MIP images confirms the above findings. IMPRESSION: 1. 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 2. The right MCA is patent, but there is diminished enhancement within the right MCA territory, possibly secondary to occlusion of small distal branches. 3. Critical Value/emergent results were called by telephone at the time of interpretation on 01/15/2019 at 8:54 pm to Dr. Arther Dames , who verbally acknowledged these results. Electronically Signed   By: Deatra Robinson M.D.   On: 01/15/2019 20:55   Ct Head Wo Contrast  Result Date: 01/15/2019 CLINICAL DATA:  Left-sided weakness. EXAM: CT PERFUSION BRAIN CT HEAD WITHOUT CONTRAST TECHNIQUE: Multiphase CT imaging of the brain was performed following IV bolus contrast injection. Subsequent parametric perfusion maps were calculated using RAPID software. CONTRAST:  27mL ISOVUE-370  IOPAMIDOL (ISOVUE-370) INJECTION 76% COMPARISON:  None. FINDINGS: CT head findings: Brain: There is no mass, hemorrhage or extra-axial collection. The size and configuration of the ventricles and extra-axial CSF spaces are normal. There is poor gray-white differentiation of the posterior right insular cortex and within the anterior right temporal lobe. Vascular: No abnormal hyperdensity of the major intracranial arteries or dural venous sinuses. No intracranial atherosclerosis. Skull: The visualized skull base, calvarium and extracranial soft tissues are normal. Sinuses/Orbits: No fluid levels or advanced mucosal thickening of the visualized paranasal sinuses. No mastoid or middle ear effusion. The orbits are normal. ASPECTS (Alberta Stroke Program Early CT Score) - Ganglionic level infarction (caudate, lentiform nuclei, internal capsule, insula, M1-M3 cortex): 5 - Supraganglionic infarction (M4-M6 cortex): 3 Total score (0-10 with 10 being normal): 8 CT Brain Perfusion Findings: CBF (<30%) Volume: 35mL Perfusion (Tmax>6.0s) volume: Mismatch Volume: Infarct Core: 0 mL Infarction Location:No demonstrable infarct by CT perfusion criteria. IMPRESSION: 1. Progressive loss of gray-white differentiation within the posterior right insula and anterior right temporal lobe. ASPECTS is 8. 2. No intracranial hemorrhage. 3. CT perfusion analysis demonstrating ischemic penumbra of 104 mL. Though there is no core infarct demonstrated by the perfusion scan, the early changes on the noncontrast portion of the study suggest that there most likely is a small core, though clearly smaller than the at risk region. These results were called by telephone at the time of interpretation on 01/15/2019 at 10:41 pm to Dr. Arther Dames , who verbally acknowledged these results. Electronically Signed   By: Deatra Robinson M.D.   On: 01/15/2019 22:41   Ct Angio Neck W Or Wo Contrast  Result Date: 01/15/2019 CLINICAL DATA:  Stroke  follow-up. EXAM: CT ANGIOGRAPHY HEAD AND NECK TECHNIQUE: Multidetector CT imaging of the head and neck was performed using the standard protocol during bolus administration of intravenous contrast. Multiplanar CT image reconstructions and MIPs were obtained to evaluate the vascular anatomy. Carotid stenosis measurements (when applicable) are obtained utilizing NASCET criteria, using the distal internal carotid diameter as the denominator. CONTRAST:  50mL OMNIPAQUE IOHEXOL 350 MG/ML SOLN COMPARISON:  Head CT 01/15/2019 FINDINGS: CTA NECK FINDINGS SKELETON: There is no bony spinal canal stenosis. No lytic or blastic lesion. OTHER NECK: Normal pharynx, larynx and major salivary glands. No cervical lymphadenopathy. Unremarkable thyroid gland. UPPER CHEST: No pneumothorax or pleural effusion. No nodules or masses. AORTIC ARCH: There is no calcific atherosclerosis of the aortic arch. There is no aneurysm, dissection or hemodynamically significant stenosis of the visualized ascending aorta and aortic arch. Conventional 3  vessel aortic branching pattern. There is atherosclerotic plaque within the proximal arch vessels. No high-grade stenosis. RIGHT CAROTID SYSTEM: --Common carotid artery: There is predominantly noncalcified plaque within the distal right common carotid artery causing approximately 60% stenosis. --Internal carotid artery: Occluded at its origin. There is return of opacification at the proximal petrous segment. --External carotid artery: No acute abnormality. LEFT CAROTID SYSTEM: --Common carotid artery: Widely patent origin without common carotid artery dissection or aneurysm. --Internal carotid artery: No dissection, occlusion or aneurysm. Mild atherosclerotic disease at the carotid bifurcation without hemodynamically significant stenosis. --External carotid artery: No acute abnormality. VERTEBRAL ARTERIES: Right dominant configuration. Both origins are normal. No dissection, occlusion or flow-limiting  stenosis to the vertebrobasilar confluence. CTA HEAD FINDINGS POSTERIOR CIRCULATION: --Vertebral arteries: Normal right dominant configuration of V4 segments. --Posterior inferior cerebellar arteries (PICA): Patent origins from the vertebral arteries. --Anterior inferior cerebellar arteries (AICA): Patent origins from the basilar artery. --Basilar artery: Normal. --Superior cerebellar arteries: Normal. --Posterior cerebral arteries (PCA): Normal. Both originate from the basilar artery. Posterior communicating arteries (p-comm) are diminutive or absent. ANTERIOR CIRCULATION: --Intracranial internal carotid arteries: There is diminished opacification of the right ICA at the skull base. Atherosclerotic calcification of the left ICA. --Anterior cerebral arteries (ACA): Normal. Both A1 segments are present. Patent anterior communicating artery (a-comm). --Middle cerebral arteries (MCA): Normal. VENOUS SINUSES: As permitted by contrast timing, patent. ANATOMIC VARIANTS: None DELAYED PHASE: Not performed. Review of the MIP images confirms the above findings. IMPRESSION: 1. 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 2. The right MCA is patent, but there is diminished enhancement within the right MCA territory, possibly secondary to occlusion of small distal branches. 3. Critical Value/emergent results were called by telephone at the time of interpretation on 01/15/2019 at 8:54 pm to Dr. Arther Dames , who verbally acknowledged these results. Electronically Signed   By: Deatra Robinson M.D.   On: 01/15/2019 20:55   Ct Cerebral Perfusion W Contrast  Result Date: 01/15/2019 CLINICAL DATA:  Left-sided weakness. EXAM: CT PERFUSION BRAIN CT HEAD WITHOUT CONTRAST TECHNIQUE: Multiphase CT imaging of the brain was performed following IV bolus contrast injection. Subsequent parametric perfusion maps were calculated using RAPID software. CONTRAST:  40mL  ISOVUE-370 IOPAMIDOL (ISOVUE-370) INJECTION 76% COMPARISON:  None. FINDINGS: CT head findings: Brain: There is no mass, hemorrhage or extra-axial collection. The size and configuration of the ventricles and extra-axial CSF spaces are normal. There is poor gray-white differentiation of the posterior right insular cortex and within the anterior right temporal lobe. Vascular: No abnormal hyperdensity of the major intracranial arteries or dural venous sinuses. No intracranial atherosclerosis. Skull: The visualized skull base, calvarium and extracranial soft tissues are normal. Sinuses/Orbits: No fluid levels or advanced mucosal thickening of the visualized paranasal sinuses. No mastoid or middle ear effusion. The orbits are normal. ASPECTS (Alberta Stroke Program Early CT Score) - Ganglionic level infarction (caudate, lentiform nuclei, internal capsule, insula, M1-M3 cortex): 5 - Supraganglionic infarction (M4-M6 cortex): 3 Total score (0-10 with 10 being normal): 8 CT Brain Perfusion Findings: CBF (<30%) Volume: 0mL Perfusion (Tmax>6.0s) volume: Mismatch Volume: Infarct Core: 0 mL Infarction Location:No demonstrable infarct by CT perfusion criteria. IMPRESSION: 1. Progressive loss of gray-white differentiation within the posterior right insula and anterior right temporal lobe. ASPECTS is 8. 2. No intracranial hemorrhage. 3. CT perfusion analysis demonstrating ischemic penumbra of 104 mL. Though there is no core infarct demonstrated by the perfusion scan, the  early changes on the noncontrast portion of the study suggest that there most likely is a small core, though clearly smaller than the at risk region. These results were called by telephone at the time of interpretation on 01/15/2019 at 10:41 pm to Dr. Arther Dames , who verbally acknowledged these results. Electronically Signed   By: Deatra Robinson M.D.   On: 01/15/2019 22:41   Ct Head Code Stroke Wo Contrast  Result Date: 01/15/2019 CLINICAL DATA:   Code stroke.  Left-sided weakness.  Stroke EXAM: CT HEAD WITHOUT CONTRAST TECHNIQUE: Contiguous axial images were obtained from the base of the skull through the vertex without intravenous contrast. COMPARISON:  CT head 10/20/2018 FINDINGS: Brain: Mild atrophy. Negative for acute infarct, hemorrhage, or mass. Vascular: Hyperdense right MCA compatible with acute thrombosis. This was not seen previously. No other hyperdense vessel. Skull: Negative Sinuses/Orbits: Negative Other: None ASPECTS (Alberta Stroke Program Early CT Score) - Ganglionic level infarction (caudate, lentiform nuclei, internal capsule, insula, M1-M3 cortex): 7 - Supraganglionic infarction (M4-M6 cortex): 3 Total score (0-10 with 10 being normal): 10 IMPRESSION: 1. Hyperdense right MCA compatible with acute thrombosis. Negative for acute infarct or hemorrhage 2. ASPECTS is 10 3. These results were called by telephone at the time of interpretation on 01/15/2019 at 8:26 pm to Dr. Laurence Slate, who verbally acknowledged these results. Electronically Signed   By: Marlan Palau M.D.   On: 01/15/2019 20:27    Procedures .Critical Care Performed by: Alvira Monday, MD Authorized by: Alvira Monday, MD   Critical care provider statement:    Critical care time (minutes):  30   Critical care was necessary to treat or prevent imminent or life-threatening deterioration of the following conditions:  CNS failure or compromise   Critical care was time spent personally by me on the following activities:  Discussions with consultants, ordering and review of radiographic studies, ordering and review of laboratory studies, re-evaluation of patient's condition and examination of patient   (including critical care time)  Medications Ordered in ED Medications  alteplase (ACTIVASE) 1 mg/mL infusion 66.5 mg (66.5 mg Intravenous New Bag/Given 01/15/19 2056)    Followed by  0.9 %  sodium chloride infusion (has no administration in time range)   stroke:  mapping our early stages of recovery book (has no administration in time range)  0.9 %  sodium chloride infusion ( Intravenous New Bag/Given 01/15/19 2125)  acetaminophen (TYLENOL) tablet 650 mg (has no administration in time range)    Or  acetaminophen (TYLENOL) solution 650 mg (has no administration in time range)    Or  acetaminophen (TYLENOL) suppository 650 mg (has no administration in time range)  pantoprazole (PROTONIX) injection 40 mg (has no administration in time range)  iopamidol (ISOVUE-370) 76 % injection (has no administration in time range)  tirofiban (AGGRASTAT) 5-0.9 MG/100ML-% injection (has no administration in time range)  aspirin 325 MG tablet (has no administration in time range)  clopidogrel (PLAVIX) 300 MG tablet (has no administration in time range)  nitroGLYCERIN 100 mcg/mL intra-arterial injection (has no administration in time range)  ceFAZolin (ANCEF) 2-4 GM/100ML-% IVPB (has no administration in time range)  iohexol (OMNIPAQUE) 350 MG/ML injection 75 mL (75 mLs Intravenous Contrast Given 01/15/19 2033)  iopamidol (ISOVUE-370) 76 % injection 40 mL (40 mLs Intravenous Contrast Given 01/15/19 2229)  ticagrelor (BRILINTA) 90 MG tablet (180 mg Orogastric Given 01/15/19 2351)  eptifibatide (INTEGRILIN) 20 MG/10ML injection (3 mg Intra-arterial Given 01/16/19 0032)  aspirin 81 MG chewable tablet (81 mg Orogastric  Given 01/15/19 2351)  fentaNYL (SUBLIMAZE) 100 MCG/2ML injection (  Override pull for Anesthesia 01/15/19 2339)     Initial Impression / Assessment and Plan / ED Course  I have reviewed the triage vital signs and the nursing notes.  Pertinent labs & imaging results that were available during my care of the patient were reviewed by me and considered in my medical decision making (see chart for details).        68 year old male with history of hypertension, hyperlipidemia, smoking, presents with concern for syncope.    On my evaluation he is sleepy, able to  provide history, and fiance providing most of history of events.  History provided concerning for seizure vs syncope.  However, my physical exam is notable for left sided weakness of the left face, arm and leg and question at times of neglect. Code stroke was called when physical exam revealed these deficits with last known normal 530PM.   Patient taken to CT and Dr. Laurence Slate to bedside.  CTA shows occlusion of right internal carotid artery at its origin, and given left sided symptoms, tPA was initiated and he was taken emergently to IR for further care.   Of note, history provided to EMS and triage was for syncopal episode, dyspnea, cough and sick contact with possible COVID concern----however on my history he denies new cough or dyspnea and reports sick contact has "chest cold" but no fever or cough.  Given he is presenting with CVA and NOT presenting with cough, shortness of breath or fever and in addition has no known COVID19 contacts, I have low suspicion for COVID19 and he does not meet testing criteria.   Admitted to Neurology for further care of right sided CVA and ICA occlusion.   Final Clinical Impressions(s) / ED Diagnoses   Final diagnoses:  Right sided cerebral hemisphere cerebrovascular accident (CVA) (HCC)  ICAO (internal carotid artery occlusion), right    ED Discharge Orders    None       Alvira Monday, MD 01/16/19 0121

## 2019-01-15 NOTE — ED Notes (Signed)
Called Code Stroke

## 2019-01-15 NOTE — Code Documentation (Signed)
Code Stroke called at 2001 by ED staff. LSN 1715. Patient came to ED via [facility] for syncope at home. Code Stroke was called for LT sided weakness, slurred speech, and facial weakness.   NIH 7. 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 NVIR called at 2037. Plan - admit to NTICU and monitor overnight, if symptoms worsen -- will go NVIR tonight, if not then will proceed for NVIR in the morning. 1st [facility] post TPA start - 3.   Patient is already prepped for NVIR should the need arise  Start Time 2001 End Time 2110

## 2019-01-16 ENCOUNTER — Encounter (HOSPITAL_COMMUNITY): Payer: Self-pay | Admitting: Interventional Radiology

## 2019-01-16 ENCOUNTER — Inpatient Hospital Stay (HOSPITAL_COMMUNITY): Payer: Medicare PPO

## 2019-01-16 DIAGNOSIS — I6601 Occlusion and stenosis of right middle cerebral artery: Secondary | ICD-10-CM | POA: Diagnosis present

## 2019-01-16 DIAGNOSIS — I639 Cerebral infarction, unspecified: Secondary | ICD-10-CM

## 2019-01-16 HISTORY — PX: IR CT HEAD LTD: IMG2386

## 2019-01-16 HISTORY — PX: IR INTRAVSC STENT CERV CAROTID W/O EMB-PROT MOD SED INC ANGIO: IMG2304

## 2019-01-16 HISTORY — PX: IR PERCUTANEOUS ART THROMBECTOMY/INFUSION INTRACRANIAL INC DIAG ANGIO: IMG6087

## 2019-01-16 HISTORY — PX: IR ANGIO VERTEBRAL SEL SUBCLAVIAN INNOMINATE UNI R MOD SED: IMG5365

## 2019-01-16 LAB — BASIC METABOLIC PANEL
ANION GAP: 9 (ref 5–15)
BUN: 22 mg/dL (ref 8–23)
CHLORIDE: 113 mmol/L — AB (ref 98–111)
CO2: 20 mmol/L — ABNORMAL LOW (ref 22–32)
Calcium: 8.2 mg/dL — ABNORMAL LOW (ref 8.9–10.3)
Creatinine, Ser: 0.96 mg/dL (ref 0.61–1.24)
GFR calc Af Amer: >60 mL/min (ref >60)
GFR calc non Af Amer: >60 mL/min (ref >60)
Glucose, Bld: 124 mg/dL — ABNORMAL HIGH (ref 70–99)
Potassium: 3.8 mmol/L (ref 3.5–5.1)
Sodium: 142 mmol/L (ref 135–145)

## 2019-01-16 LAB — COMPREHENSIVE METABOLIC PANEL
ALT: 20 U/L (ref 0–44)
AST: 16 U/L (ref 15–41)
Albumin: 3.6 g/dL (ref 3.5–5.0)
Alkaline Phosphatase: 71 U/L (ref 38–126)
Anion gap: 14 (ref 5–15)
BUN: 23 mg/dL (ref 8–23)
CO2: 21 mmol/L — AB (ref 22–32)
Calcium: 9.2 mg/dL (ref 8.9–10.3)
Chloride: 104 mmol/L (ref 98–111)
Creatinine, Ser: 0.9 mg/dL (ref 0.61–1.24)
GFR calc Af Amer: >60 mL/min (ref >60)
GFR calc non Af Amer: >60 mL/min (ref >60)
Glucose, Bld: 140 mg/dL — ABNORMAL HIGH (ref 70–99)
POTASSIUM: 3.8 mmol/L (ref 3.5–5.1)
Sodium: 139 mmol/L (ref 135–145)
Total Bilirubin: 0.5 mg/dL (ref 0.3–1.2)
Total Protein: 6.8 g/dL (ref 6.5–8.1)

## 2019-01-16 LAB — CBC
HCT: 46 % (ref 39.0–52.0)
Hemoglobin: 15.8 g/dL (ref 13.0–17.0)
MCH: 30.6 pg (ref 26.0–34.0)
MCHC: 34.3 g/dL (ref 30.0–36.0)
MCV: 89 fL (ref 80.0–100.0)
Platelets: 176 10*3/uL (ref 150–400)
RBC: 5.17 MIL/uL (ref 4.22–5.81)
RDW: 12.4 % (ref 11.5–15.5)
WBC: 11.3 10*3/uL — AB (ref 4.0–10.5)
nRBC: 0 % (ref 0.0–0.2)

## 2019-01-16 LAB — CBC WITH DIFFERENTIAL/PLATELET
ABS IMMATURE GRANULOCYTES: 0.2 10*3/uL — AB (ref 0.00–0.07)
Basophils Absolute: 0.1 10*3/uL (ref 0.0–0.1)
Basophils Relative: 0 %
Eosinophils Absolute: 0 10*3/uL (ref 0.0–0.5)
Eosinophils Relative: 0 %
HCT: 41.8 % (ref 39.0–52.0)
Hemoglobin: 14 g/dL (ref 13.0–17.0)
Immature Granulocytes: 2 %
LYMPHS ABS: 1.1 10*3/uL (ref 0.7–4.0)
Lymphocytes Relative: 9 %
MCH: 30.1 pg (ref 26.0–34.0)
MCHC: 33.5 g/dL (ref 30.0–36.0)
MCV: 89.9 fL (ref 80.0–100.0)
Monocytes Absolute: 0.8 10*3/uL (ref 0.1–1.0)
Monocytes Relative: 7 %
Neutro Abs: 9.9 10*3/uL — ABNORMAL HIGH (ref 1.7–7.7)
Neutrophils Relative %: 82 %
Platelets: 153 10*3/uL (ref 150–400)
RBC: 4.65 MIL/uL (ref 4.22–5.81)
RDW: 12.6 % (ref 11.5–15.5)
WBC: 12 10*3/uL — ABNORMAL HIGH (ref 4.0–10.5)
nRBC: 0 % (ref 0.0–0.2)

## 2019-01-16 LAB — DIFFERENTIAL
ABS IMMATURE GRANULOCYTES: 0.21 10*3/uL — AB (ref 0.00–0.07)
BASOS PCT: 0 %
Basophils Absolute: 0.1 10*3/uL (ref 0.0–0.1)
Eosinophils Absolute: 0 10*3/uL (ref 0.0–0.5)
Eosinophils Relative: 0 %
Immature Granulocytes: 2 %
Lymphocytes Relative: 8 %
Lymphs Abs: 0.9 10*3/uL (ref 0.7–4.0)
Monocytes Absolute: 0.4 10*3/uL (ref 0.1–1.0)
Monocytes Relative: 4 %
Neutro Abs: 9.7 10*3/uL — ABNORMAL HIGH (ref 1.7–7.7)
Neutrophils Relative %: 86 %

## 2019-01-16 LAB — ETHANOL: Alcohol, Ethyl (B): <10 mg/dL (ref <10)

## 2019-01-16 LAB — APTT: aPTT: 26 seconds (ref 24–36)

## 2019-01-16 LAB — HIV ANTIBODY (ROUTINE TESTING W REFLEX): HIV Screen 4th Generation wRfx: NONREACTIVE

## 2019-01-16 LAB — ECHOCARDIOGRAM COMPLETE
Height: 67 in
Weight: 2608 oz

## 2019-01-16 LAB — HEMOGLOBIN A1C
Hgb A1c MFr Bld: 6.6 % — ABNORMAL HIGH (ref 4.8–5.6)
Mean Plasma Glucose: 142.72 mg/dL

## 2019-01-16 LAB — LIPID PANEL
Cholesterol: 152 mg/dL (ref 0–200)
HDL: 35 mg/dL — AB (ref >40)
LDL Cholesterol: 96 mg/dL (ref 0–99)
Total CHOL/HDL Ratio: 4.3 RATIO
Triglycerides: 107 mg/dL (ref <150)
VLDL: 21 mg/dL (ref 0–40)

## 2019-01-16 LAB — PROTIME-INR
INR: 1.1 (ref 0.8–1.2)
Prothrombin Time: 14.1 seconds (ref 11.4–15.2)

## 2019-01-16 LAB — MRSA PCR SCREENING: MRSA by PCR: NEGATIVE

## 2019-01-16 LAB — PLATELET INHIBITION P2Y12: Platelet Function  P2Y12: 75 [PRU] — ABNORMAL LOW (ref 182–335)

## 2019-01-16 MED ORDER — ASPIRIN 81 MG PO CHEW: 81.0000 mg | CHEWABLE_TABLET | Freq: Every day | ORAL | Status: DC

## 2019-01-16 MED ORDER — IOHEXOL 300 MG/ML  SOLN
15.0000 mL | Freq: Once | INTRAMUSCULAR | Status: AC | PRN
  Administered 2019-01-16: 15 mL via INTRA_ARTERIAL

## 2019-01-16 MED ORDER — PNEUMOCOCCAL VAC POLYVALENT 25 MCG/0.5ML IJ INJ
0.5000 mL | INJECTION | INTRAMUSCULAR | Status: AC
  Administered 2019-01-17: 0.5 mL via INTRAMUSCULAR
  Filled 2019-01-16: qty 0.5

## 2019-01-16 MED ORDER — TICAGRELOR 90 MG PO TABS: 90.0000 mg | ORAL_TABLET | Freq: Two times a day (BID) | ORAL | Status: DC

## 2019-01-16 MED ORDER — SODIUM CHLORIDE 0.9 % IV BOLUS
1000.0000 mL | Freq: Once | INTRAVENOUS | Status: AC
  Administered 2019-01-16: 1000 mL via INTRAVENOUS

## 2019-01-16 MED ORDER — ACETAMINOPHEN 325 MG PO TABS: 650.0000 mg | ORAL_TABLET | ORAL | Status: DC | PRN

## 2019-01-16 MED ORDER — NICOTINE 14 MG/24HR TD PT24
14.0000 mg | MEDICATED_PATCH | Freq: Every day | TRANSDERMAL | Status: DC
  Administered 2019-01-16 – 2019-01-18 (×3): 14 mg via TRANSDERMAL
  Filled 2019-01-16 (×3): qty 1

## 2019-01-16 MED ORDER — TAMSULOSIN HCL 0.4 MG PO CAPS
0.4000 mg | ORAL_CAPSULE | Freq: Every day | ORAL | Status: DC
  Administered 2019-01-16 – 2019-01-18 (×3): 0.4 mg via ORAL
  Filled 2019-01-16 (×3): qty 1

## 2019-01-16 MED ORDER — ONDANSETRON HCL 4 MG/2ML IJ SOLN
INTRAMUSCULAR | Status: DC | PRN
  Administered 2019-01-16: 4 mg via INTRAVENOUS

## 2019-01-16 MED ORDER — ONDANSETRON HCL 4 MG/2ML IJ SOLN: 4.0000 mg | Freq: Four times a day (QID) | INTRAMUSCULAR | Status: DC | PRN

## 2019-01-16 MED ORDER — IOHEXOL 300 MG/ML  SOLN
100.0000 mL | Freq: Once | INTRAMUSCULAR | Status: AC | PRN
  Administered 2019-01-16: 100 mL via INTRA_ARTERIAL

## 2019-01-16 MED ORDER — HYDROMORPHONE HCL 1 MG/ML IJ SOLN: 0.2500 mg | INTRAMUSCULAR | Status: DC | PRN

## 2019-01-16 MED ORDER — ASPIRIN 81 MG PO CHEW
81.0000 mg | CHEWABLE_TABLET | Freq: Every day | ORAL | Status: DC
  Administered 2019-01-16 – 2019-01-18 (×3): 81 mg via ORAL
  Filled 2019-01-16 (×3): qty 1

## 2019-01-16 MED ORDER — HYDROXYZINE HCL 25 MG PO TABS
25.0000 mg | ORAL_TABLET | Freq: Four times a day (QID) | ORAL | Status: DC | PRN
  Administered 2019-01-17: 25 mg via ORAL
  Filled 2019-01-16 (×2): qty 1

## 2019-01-16 MED ORDER — EPHEDRINE SULFATE-NACL 50-0.9 MG/10ML-% IV SOSY
PREFILLED_SYRINGE | INTRAVENOUS | Status: DC | PRN
  Administered 2019-01-16: 10 mg via INTRAVENOUS

## 2019-01-16 MED ORDER — INFLUENZA VAC SPLIT HIGH-DOSE 0.5 ML IM SUSY
0.5000 mL | PREFILLED_SYRINGE | INTRAMUSCULAR | Status: AC
  Administered 2019-01-17: 0.5 mL via INTRAMUSCULAR
  Filled 2019-01-16: qty 0.5

## 2019-01-16 MED ORDER — ACETAMINOPHEN 650 MG RE SUPP: 650.0000 mg | RECTAL | Status: DC | PRN

## 2019-01-16 MED ORDER — TICAGRELOR 90 MG PO TABS
90.0000 mg | ORAL_TABLET | Freq: Two times a day (BID) | ORAL | Status: DC
  Administered 2019-01-16 – 2019-01-18 (×5): 90 mg via ORAL
  Filled 2019-01-16 (×5): qty 1

## 2019-01-16 MED ORDER — AMLODIPINE BESYLATE 5 MG PO TABS
5.0000 mg | ORAL_TABLET | Freq: Every day | ORAL | Status: DC
  Administered 2019-01-17: 5 mg via ORAL
  Filled 2019-01-16 (×2): qty 1

## 2019-01-16 MED ORDER — PHENYLEPHRINE 40 MCG/ML (10ML) SYRINGE FOR IV PUSH (FOR BLOOD PRESSURE SUPPORT)
PREFILLED_SYRINGE | INTRAVENOUS | Status: DC | PRN
  Administered 2019-01-16 (×2): 80 ug via INTRAVENOUS

## 2019-01-16 MED ORDER — CYCLOBENZAPRINE HCL 10 MG PO TABS
10.0000 mg | ORAL_TABLET | Freq: Two times a day (BID) | ORAL | Status: DC | PRN
  Administered 2019-01-17: 10 mg via ORAL
  Filled 2019-01-16: qty 1

## 2019-01-16 MED ORDER — LABETALOL HCL 5 MG/ML IV SOLN
INTRAVENOUS | Status: DC | PRN
  Administered 2019-01-16: 10 mg via INTRAVENOUS

## 2019-01-16 MED ORDER — SODIUM CHLORIDE 0.9 % IV SOLN
INTRAVENOUS | Status: DC
  Administered 2019-01-17: 09:00:00 via INTRAVENOUS

## 2019-01-16 MED ORDER — ATORVASTATIN CALCIUM 40 MG PO TABS
40.0000 mg | ORAL_TABLET | Freq: Every day | ORAL | Status: DC
  Administered 2019-01-16 – 2019-01-18 (×3): 40 mg via ORAL
  Filled 2019-01-16 (×3): qty 1

## 2019-01-16 MED ORDER — ACETAMINOPHEN 500 MG PO TABS: 1000.0000 mg | ORAL_TABLET | Freq: Four times a day (QID) | ORAL | Status: DC | PRN

## 2019-01-16 MED ORDER — GLYCOPYRROLATE PF 0.2 MG/ML IJ SOSY
PREFILLED_SYRINGE | INTRAMUSCULAR | Status: DC | PRN
  Administered 2019-01-16: .2 mg via INTRAVENOUS

## 2019-01-16 MED ORDER — ACETAMINOPHEN 160 MG/5ML PO SOLN: 650.0000 mg | ORAL | Status: DC | PRN

## 2019-01-16 MED ORDER — SUGAMMADEX SODIUM 200 MG/2ML IV SOLN
INTRAVENOUS | Status: DC | PRN
  Administered 2019-01-16: 200 mg via INTRAVENOUS

## 2019-01-16 MED ORDER — CLEVIDIPINE BUTYRATE 0.5 MG/ML IV EMUL
0.0000 mg/h | INTRAVENOUS | Status: DC
  Administered 2019-01-16: 1 mg/h via INTRAVENOUS
  Filled 2019-01-16: qty 50

## 2019-01-16 MED ORDER — GABAPENTIN 300 MG PO CAPS
300.0000 mg | ORAL_CAPSULE | Freq: Two times a day (BID) | ORAL | Status: DC
  Administered 2019-01-16 – 2019-01-18 (×5): 300 mg via ORAL
  Filled 2019-01-16 (×5): qty 1

## 2019-01-16 NOTE — Progress Notes (Signed)
NAME:  Bryan Davies, MRN:  [id number], DOB:  1951/08/23, LOS: 1 ADMISSION DATE:  01/15/2019, CONSULTATION DATE:  01/16/2019 REFERRING MD: Ursula Beath neurology, CHIEF COMPLAINT: Right-sided weakness  HPI/course in hospital  69 year old man who presented yesterday with right hemiplegia.  Improving symptoms with TPA but still sizable penumbra on CT perfusion study.  Underwent successful carotid stenting.  Past Medical History   Past Medical History:  Diagnosis Date  . Anxiety   . Hypertension   . Shingles 2013  . Shoulder pain      Past Surgical History:  Procedure Laterality Date  . RADIOLOGY WITH ANESTHESIA N/A 01/15/2019   Procedure: IR WITH ANESTHESIA;  Surgeon: Julieanne Cotton, MD;  Location: [facility] OR;  Service: Radiology;  Laterality: N/A;     Review of Systems:   Review of Systems  All other systems reviewed and are negative.   Social History   reports that he has been smoking cigarettes. He has a 25.00 pack-year smoking history. He has never used smokeless tobacco. He reports that he does not drink alcohol or use drugs.   Family History   His family history is not on file.   Allergies No Known Allergies   Home Medications  Prior to Admission medications   Medication Sig Start Date End Date Taking? Authorizing Provider  acetaminophen (TYLENOL) 500 MG tablet Take 1,000 mg by mouth every 6 (six) hours as needed for mild pain or headache.     [provider]  amLODipine (NORVASC) 5 MG tablet Take 1 tablet (5 mg total) by mouth daily. Patient not taking: Reported on 10/20/2018 07/25/16   Garth Bigness, MD  atorvastatin (LIPITOR) 40 MG tablet TAKE 1 TABLET BY MOUTH EVERY DAY Patient not taking: Reported on 01/25/2015 02/08/14   Lonia Skinner, MD  cyclobenzaprine (FLEXERIL) 10 MG tablet Take 1 tablet (10 mg total) by mouth 2 (two) times daily as needed for muscle spasms. Patient not taking: Reported on 01/25/2015 11/25/14   Roxy Horseman, PA-C   gabapentin (NEURONTIN) 300 MG capsule TAKE 2 CAPSULE BY MOUTH THREE TIMES DAILY Patient not taking: Reported on 10/20/2018 07/26/16   Garth Bigness, MD  hydrOXYzine (ATARAX/VISTARIL) 25 MG tablet Take 1 tablet (25 mg total) by mouth every 6 (six) hours as needed. Patient not taking: Reported on 10/20/2018 05/19/15   Elwin Mocha, MD  meloxicam (MOBIC) 15 MG tablet TAKE 1 TABLET BY MOUTH DAILY AS NEEDED FOR PAIN Patient not taking: Reported on 01/25/2015 07/14/14   Myra Rude, MD  tamsulosin (FLOMAX) 0.4 MG CAPS capsule Take 1 capsule (0.4 mg total) by mouth daily. Patient not taking: Reported on 10/20/2018 07/25/16   Garth Bigness, MD     Interim history/subjective:  Complains of inability urinate.  Known prostatism.  Objective   Blood pressure (!) 104/55, pulse 79, temperature 98.5 F (36.9 C), temperature source Oral, resp. rate 16, height 5\' 7"  (1.702 m), weight 73.9 kg, SpO2 98 %.        Intake/Output Summary (Last 24 hours) at 01/16/2019 1040 Last data filed at 01/16/2019 1000 Gross per 24 hour  Intake 1443.44 ml  Output 1025 ml  Net 418.44 ml   Filed Weights   01/15/19 1800  Weight: 73.9 kg    Examination: Physical Exam  Constitutional: He is oriented to person, place, and time. He appears well-nourished.  HENT:  Head: Atraumatic.  Eyes: Pupils are equal, round, and reactive to light.  Neck: Neck supple. No JVD present.  Cardiovascular: Regular rhythm.  Murmur (Aortic 3/6) heard. Respiratory: Breath sounds normal.  GI: Soft.  Musculoskeletal:        General: No edema.  Neurological: He is alert and oriented to person, place, and time.  No focal deficits on screening exam.     Ancillary tests (personally reviewed)  CBC: Recent Labs  Lab 01/15/19 2251 01/16/19 0542  WBC 11.3* 12.0*  NEUTROABS 9.7* 9.9*  HGB 15.8 14.0  HCT 46.0 41.8  MCV 89.0 89.9  PLT 176 153    Basic Metabolic Panel: Recent Labs  Lab 01/15/19 2251 01/16/19 0542   NA 139 142  K 3.8 3.8  CL 104 113*  CO2 21* 20*  GLUCOSE 140* 124*  BUN 23 22  CREATININE 0.90 0.96  CALCIUM 9.2 8.2*   GFR: Estimated Creatinine Clearance: 70.8 mL/min (by C-G formula based on SCr of 0.96 mg/dL). Recent Labs  Lab 01/15/19 2251 01/16/19 0542  WBC 11.3* 12.0*    Liver Function Tests: Recent Labs  Lab 01/15/19 2251  AST 16  ALT 20  ALKPHOS 71  BILITOT 0.5  PROT 6.8  ALBUMIN 3.6   No results for input(s): LIPASE, AMYLASE in the last 168 hours. No results for input(s): AMMONIA in the last 168 hours.  ABG    Component Value Date/Time   TCO2 27 08/15/2009 1525     Coagulation Profile: Recent Labs  Lab 01/15/19 2251  INR 1.1    Cardiac Enzymes: No results for input(s): CKTOTAL, CKMB, CKMBINDEX, TROPONINI in the last 168 hours.  HbA1C: Hgb A1c MFr Bld  Date/Time Value Ref Range Status  01/16/2019 05:42 AM 6.6 (H) 4.8 - 5.6 % Final    Comment:    (NOTE) Pre diabetes:          5.7%-6.4% Diabetes:              >6.4% Glycemic control for   <7.0% adults with diabetes     CBG: Recent Labs  Lab 01/15/19 2004  GLUCAP 129*     Assessment & Plan:  Status post cerebrovascular revascularization with near normal exam. No critical care issues. Secondary stroke prevention.  Best practice:  Diet: Speech evaluation Pain/Anxiety/Delirium protocol (if indicated): As needed VAP protocol (if indicated): Not indicated DVT prophylaxis: Unfractionated heparin and early  mobilization GI prophylaxis: Not indicated Urinary catheter: Restarting Flomax Glucose control: Phase 1 Mobility: Stroke rehabilitative Code Status: Full code Family Communication: No family present Disposition: May transfer after 24-hour TPA window CCM will sign off.  Critical care time: Not applicable    Lynnell Catalan, MD [facility] ICU Physician [facility]  Pager: [phone] Mobile: [phone] After hours: [phone].  01/16/2019, 10:40  AM

## 2019-01-16 NOTE — Progress Notes (Addendum)
Referring Physician(s): CODE STROKE- Aroor, Dara Lords  Supervising Physician: Julieanne Cotton  Patient Status:  [facility] - In-pt  Chief Complaint: None  Subjective:  Right MCA inferior division occlusion s/p 3mg  IA integrelin achieving a TICI 2b revascularization 01/16/2019 by Dr. Corliss Skains. Proximal right ICA acute occlusion s/p stent assisted angioplasty 01/16/2019 by Dr. Corliss Skains. Patient awake and alert laying in bed with no complaints at this time. Accompanied by girlfriend at bedside. Can spontaneously move all extremities. Right groin incision c/d/i.   Allergies: Patient has no known allergies.  Medications: Prior to Admission medications   Medication Sig Start Date End Date Taking? Authorizing Provider  acetaminophen (TYLENOL) 500 MG tablet Take 1,000 mg by mouth every 6 (six) hours as needed for mild pain or headache.     [provider]  amLODipine (NORVASC) 5 MG tablet Take 1 tablet (5 mg total) by mouth daily. Patient not taking: Reported on 10/20/2018 07/25/16   Garth Bigness, MD  atorvastatin (LIPITOR) 40 MG tablet TAKE 1 TABLET BY MOUTH EVERY DAY Patient not taking: Reported on 01/25/2015 02/08/14   Lonia Skinner, MD  cyclobenzaprine (FLEXERIL) 10 MG tablet Take 1 tablet (10 mg total) by mouth 2 (two) times daily as needed for muscle spasms. Patient not taking: Reported on 01/25/2015 11/25/14   Roxy Horseman, PA-C  gabapentin (NEURONTIN) 300 MG capsule TAKE 2 CAPSULE BY MOUTH THREE TIMES DAILY Patient not taking: Reported on 10/20/2018 07/26/16   Garth Bigness, MD  hydrOXYzine (ATARAX/VISTARIL) 25 MG tablet Take 1 tablet (25 mg total) by mouth every 6 (six) hours as needed. Patient not taking: Reported on 10/20/2018 05/19/15   Elwin Mocha, MD  meloxicam (MOBIC) 15 MG tablet TAKE 1 TABLET BY MOUTH DAILY AS NEEDED FOR PAIN Patient not taking: Reported on 01/25/2015 07/14/14   Myra Rude, MD  tamsulosin (FLOMAX) 0.4 MG CAPS capsule Take  1 capsule (0.4 mg total) by mouth daily. Patient not taking: Reported on 10/20/2018 07/25/16   Garth Bigness, MD     Vital Signs: BP 115/65   Pulse 86   Temp 98.5 F (36.9 C) (Oral)   Resp 15   Ht 5\' 7"  (1.702 m)   Wt 163 lb (73.9 kg)   SpO2 95%   BMI 25.53 kg/m   Physical Exam Vitals signs and nursing note reviewed.  Constitutional:      General: He is not in acute distress.    Appearance: Normal appearance.  Pulmonary:     Effort: Pulmonary effort is normal. No respiratory distress.  Skin:    General: Skin is warm and dry.     Comments: Right groin incision soft without active bleeding or hematoma.  Neurological:     Mental Status: He is alert.     Comments: Alert, awake, and oriented x3. Speech and comprehension intact. PERRL bilaterally. EOMs intact bilaterally without nystagmus or subjective diplopia. No facial asymmetry. Tongue midline. Can spontaneously move all extremities. No pronator drift. Fine motor and coordination intact and symmetric. Visual fields, gait, romberg, heel to toe not assessed. Distal pulses 2+ bilaterally.  Psychiatric:        Mood and Affect: Mood normal.        Behavior: Behavior normal.        Thought Content: Thought content normal.        Judgment: Judgment normal.     Imaging: Ct Angio Head W Or Wo Contrast  Result Date: 01/15/2019 CLINICAL DATA:  Stroke follow-up. EXAM: CT ANGIOGRAPHY HEAD AND  NECK TECHNIQUE: Multidetector CT imaging of the head and neck was performed using the standard protocol during bolus administration of intravenous contrast. Multiplanar CT image reconstructions and MIPs were obtained to evaluate the vascular anatomy. Carotid stenosis measurements (when applicable) are obtained utilizing NASCET criteria, using the distal internal carotid diameter as the denominator. CONTRAST:  75mL OMNIPAQUE IOHEXOL 350 MG/ML SOLN COMPARISON:  Head CT 01/15/2019 FINDINGS: CTA NECK FINDINGS SKELETON: There is no bony  spinal canal stenosis. No lytic or blastic lesion. OTHER NECK: Normal pharynx, larynx and major salivary glands. No cervical lymphadenopathy. Unremarkable thyroid gland. UPPER CHEST: No pneumothorax or pleural effusion. No nodules or masses. AORTIC ARCH: There is no calcific atherosclerosis of the aortic arch. There is no aneurysm, dissection or hemodynamically significant stenosis of the visualized ascending aorta and aortic arch. Conventional 3 vessel aortic branching pattern. There is atherosclerotic plaque within the proximal arch vessels. No high-grade stenosis. RIGHT CAROTID SYSTEM: --Common carotid artery: There is predominantly noncalcified plaque within the distal right common carotid artery causing approximately 60% stenosis. --Internal carotid artery: Occluded at its origin. There is return of opacification at the proximal petrous segment. --External carotid artery: No acute abnormality. LEFT CAROTID SYSTEM: --Common carotid artery: Widely patent origin without common carotid artery dissection or aneurysm. --Internal carotid artery: No dissection, occlusion or aneurysm. Mild atherosclerotic disease at the carotid bifurcation without hemodynamically significant stenosis. --External carotid artery: No acute abnormality. VERTEBRAL ARTERIES: Right dominant configuration. Both origins are normal. No dissection, occlusion or flow-limiting stenosis to the vertebrobasilar confluence. CTA HEAD FINDINGS POSTERIOR CIRCULATION: --Vertebral arteries: Normal right dominant configuration of V4 segments. --Posterior inferior cerebellar arteries (PICA): Patent origins from the vertebral arteries. --Anterior inferior cerebellar arteries (AICA): Patent origins from the basilar artery. --Basilar artery: Normal. --Superior cerebellar arteries: Normal. --Posterior cerebral arteries (PCA): Normal. Both originate from the basilar artery. Posterior communicating arteries (p-comm) are diminutive or absent. ANTERIOR CIRCULATION:  --Intracranial internal carotid arteries: There is diminished opacification of the right ICA at the skull base. Atherosclerotic calcification of the left ICA. --Anterior cerebral arteries (ACA): Normal. Both A1 segments are present. Patent anterior communicating artery (a-comm). --Middle cerebral arteries (MCA): Normal. VENOUS SINUSES: As permitted by contrast timing, patent. ANATOMIC VARIANTS: None DELAYED PHASE: Not performed. Review of the MIP images confirms the above findings. IMPRESSION: 1. 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 2. The right MCA is patent, but there is diminished enhancement within the right MCA territory, possibly secondary to occlusion of small distal branches. 3. Critical Value/emergent results were called by telephone at the time of interpretation on 01/15/2019 at 8:54 pm to Dr. Arther Dames , who verbally acknowledged these results. Electronically Signed   By: Deatra Robinson M.D.   On: 01/15/2019 20:55   Ct Head Wo Contrast  Result Date: 01/15/2019 CLINICAL DATA:  Left-sided weakness. EXAM: CT PERFUSION BRAIN CT HEAD WITHOUT CONTRAST TECHNIQUE: Multiphase CT imaging of the brain was performed following IV bolus contrast injection. Subsequent parametric perfusion maps were calculated using RAPID software. CONTRAST:  40mL ISOVUE-370 IOPAMIDOL (ISOVUE-370) INJECTION 76% COMPARISON:  None. FINDINGS: CT head findings: Brain: There is no mass, hemorrhage or extra-axial collection. The size and configuration of the ventricles and extra-axial CSF spaces are normal. There is poor gray-white differentiation of the posterior right insular cortex and within the anterior right temporal lobe. Vascular: No abnormal hyperdensity of the major intracranial arteries or dural venous sinuses. No intracranial atherosclerosis. Skull: The visualized  skull base, calvarium and extracranial soft tissues are normal.  Sinuses/Orbits: No fluid levels or advanced mucosal thickening of the visualized paranasal sinuses. No mastoid or middle ear effusion. The orbits are normal. ASPECTS (Alberta Stroke Program Early CT Score) - Ganglionic level infarction (caudate, lentiform nuclei, internal capsule, insula, M1-M3 cortex): 5 - Supraganglionic infarction (M4-M6 cortex): 3 Total score (0-10 with 10 being normal): 8 CT Brain Perfusion Findings: CBF (<30%) Volume: 23mL Perfusion (Tmax>6.0s) volume: Mismatch Volume: Infarct Core: 0 mL Infarction Location:No demonstrable infarct by CT perfusion criteria. IMPRESSION: 1. Progressive loss of gray-white differentiation within the posterior right insula and anterior right temporal lobe. ASPECTS is 8. 2. No intracranial hemorrhage. 3. CT perfusion analysis demonstrating ischemic penumbra of 104 mL. Though there is no core infarct demonstrated by the perfusion scan, the early changes on the noncontrast portion of the study suggest that there most likely is a small core, though clearly smaller than the at risk region. These results were called by telephone at the time of interpretation on 01/15/2019 at 10:41 pm to Dr. Arther Dames , who verbally acknowledged these results. Electronically Signed   By: Deatra Robinson M.D.   On: 01/15/2019 22:41   Ct Angio Neck W Or Wo Contrast  Result Date: 01/15/2019 CLINICAL DATA:  Stroke follow-up. EXAM: CT ANGIOGRAPHY HEAD AND NECK TECHNIQUE: Multidetector CT imaging of the head and neck was performed using the standard protocol during bolus administration of intravenous contrast. Multiplanar CT image reconstructions and MIPs were obtained to evaluate the vascular anatomy. Carotid stenosis measurements (when applicable) are obtained utilizing NASCET criteria, using the distal internal carotid diameter as the denominator. CONTRAST:  1mL OMNIPAQUE IOHEXOL 350 MG/ML SOLN COMPARISON:  Head CT 01/15/2019 FINDINGS: CTA NECK FINDINGS SKELETON: There is no  bony spinal canal stenosis. No lytic or blastic lesion. OTHER NECK: Normal pharynx, larynx and major salivary glands. No cervical lymphadenopathy. Unremarkable thyroid gland. UPPER CHEST: No pneumothorax or pleural effusion. No nodules or masses. AORTIC ARCH: There is no calcific atherosclerosis of the aortic arch. There is no aneurysm, dissection or hemodynamically significant stenosis of the visualized ascending aorta and aortic arch. Conventional 3 vessel aortic branching pattern. There is atherosclerotic plaque within the proximal arch vessels. No high-grade stenosis. RIGHT CAROTID SYSTEM: --Common carotid artery: There is predominantly noncalcified plaque within the distal right common carotid artery causing approximately 60% stenosis. --Internal carotid artery: Occluded at its origin. There is return of opacification at the proximal petrous segment. --External carotid artery: No acute abnormality. LEFT CAROTID SYSTEM: --Common carotid artery: Widely patent origin without common carotid artery dissection or aneurysm. --Internal carotid artery: No dissection, occlusion or aneurysm. Mild atherosclerotic disease at the carotid bifurcation without hemodynamically significant stenosis. --External carotid artery: No acute abnormality. VERTEBRAL ARTERIES: Right dominant configuration. Both origins are normal. No dissection, occlusion or flow-limiting stenosis to the vertebrobasilar confluence. CTA HEAD FINDINGS POSTERIOR CIRCULATION: --Vertebral arteries: Normal right dominant configuration of V4 segments. --Posterior inferior cerebellar arteries (PICA): Patent origins from the vertebral arteries. --Anterior inferior cerebellar arteries (AICA): Patent origins from the basilar artery. --Basilar artery: Normal. --Superior cerebellar arteries: Normal. --Posterior cerebral arteries (PCA): Normal. Both originate from the basilar artery. Posterior communicating arteries (p-comm) are diminutive or absent. ANTERIOR  CIRCULATION: --Intracranial internal carotid arteries: There is diminished opacification of the right ICA at the skull base. Atherosclerotic calcification of the left ICA. --Anterior cerebral arteries (ACA): Normal. Both A1 segments are present. Patent anterior communicating artery (a-comm). --Middle cerebral arteries (MCA): Normal. VENOUS SINUSES:  As permitted by contrast timing, patent. ANATOMIC VARIANTS: None DELAYED PHASE: Not performed. Review of the MIP images confirms the above findings. IMPRESSION: 1. 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 2. The right MCA is patent, but there is diminished enhancement within the right MCA territory, possibly secondary to occlusion of small distal branches. 3. Critical Value/emergent results were called by telephone at the time of interpretation on 01/15/2019 at 8:54 pm to Dr. Arther Dames , who verbally acknowledged these results. Electronically Signed   By: Deatra Robinson M.D.   On: 01/15/2019 20:55   Ct Cerebral Perfusion W Contrast  Result Date: 01/15/2019 CLINICAL DATA:  Left-sided weakness. EXAM: CT PERFUSION BRAIN CT HEAD WITHOUT CONTRAST TECHNIQUE: Multiphase CT imaging of the brain was performed following IV bolus contrast injection. Subsequent parametric perfusion maps were calculated using RAPID software. CONTRAST:  40mL ISOVUE-370 IOPAMIDOL (ISOVUE-370) INJECTION 76% COMPARISON:  None. FINDINGS: CT head findings: Brain: There is no mass, hemorrhage or extra-axial collection. The size and configuration of the ventricles and extra-axial CSF spaces are normal. There is poor gray-white differentiation of the posterior right insular cortex and within the anterior right temporal lobe. Vascular: No abnormal hyperdensity of the major intracranial arteries or dural venous sinuses. No intracranial atherosclerosis. Skull: The visualized skull base, calvarium and extracranial soft tissues  are normal. Sinuses/Orbits: No fluid levels or advanced mucosal thickening of the visualized paranasal sinuses. No mastoid or middle ear effusion. The orbits are normal. ASPECTS (Alberta Stroke Program Early CT Score) - Ganglionic level infarction (caudate, lentiform nuclei, internal capsule, insula, M1-M3 cortex): 5 - Supraganglionic infarction (M4-M6 cortex): 3 Total score (0-10 with 10 being normal): 8 CT Brain Perfusion Findings: CBF (<30%) Volume: 0mL Perfusion (Tmax>6.0s) volume: Mismatch Volume: Infarct Core: 0 mL Infarction Location:No demonstrable infarct by CT perfusion criteria. IMPRESSION: 1. Progressive loss of gray-white differentiation within the posterior right insula and anterior right temporal lobe. ASPECTS is 8. 2. No intracranial hemorrhage. 3. CT perfusion analysis demonstrating ischemic penumbra of 104 mL. Though there is no core infarct demonstrated by the perfusion scan, the early changes on the noncontrast portion of the study suggest that there most likely is a small core, though clearly smaller than the at risk region. These results were called by telephone at the time of interpretation on 01/15/2019 at 10:41 pm to Dr. Arther Dames , who verbally acknowledged these results. Electronically Signed   By: Deatra Robinson M.D.   On: 01/15/2019 22:41   Ct Head Code Stroke Wo Contrast  Result Date: 01/15/2019 CLINICAL DATA:  Code stroke.  Left-sided weakness.  Stroke EXAM: CT HEAD WITHOUT CONTRAST TECHNIQUE: Contiguous axial images were obtained from the base of the skull through the vertex without intravenous contrast. COMPARISON:  CT head 10/20/2018 FINDINGS: Brain: Mild atrophy. Negative for acute infarct, hemorrhage, or mass. Vascular: Hyperdense right MCA compatible with acute thrombosis. This was not seen previously. No other hyperdense vessel. Skull: Negative Sinuses/Orbits: Negative Other: None ASPECTS (Alberta Stroke Program Early CT Score) - Ganglionic level infarction  (caudate, lentiform nuclei, internal capsule, insula, M1-M3 cortex): 7 - Supraganglionic infarction (M4-M6 cortex): 3 Total score (0-10 with 10 being normal): 10 IMPRESSION: 1. Hyperdense right MCA compatible with acute thrombosis. Negative for acute infarct or hemorrhage 2. ASPECTS is 10 3. These results were called by telephone at the time of interpretation on 01/15/2019 at 8:26 pm to Dr. Laurence Slate, who verbally acknowledged these results. Electronically  Signed   By: Marlan Palauharles  Clark M.D.   On: 01/15/2019 20:27    Labs:  CBC: Recent Labs    10/20/18 1627 01/15/19 2251 01/16/19 0542  WBC 6.4 11.3* 12.0*  HGB 16.1 15.8 14.0  HCT 49.3 46.0 41.8  PLT 182 176 153    COAGS: Recent Labs    01/15/19 2251  INR 1.1  APTT 26    BMP: Recent Labs    10/20/18 1627 01/15/19 2251 01/16/19 0542  NA 138 139 142  K 4.1 3.8 3.8  CL 104 104 113*  CO2 23 21* 20*  GLUCOSE 83 140* 124*  BUN 15 23 22   CALCIUM 8.8* 9.2 8.2*  CREATININE 0.98 0.90 0.96  GFRNONAA >60 >60 >60  GFRAA >60 >60 >60    LIVER FUNCTION TESTS: Recent Labs    01/15/19 2251  BILITOT 0.5  AST 16  ALT 20  ALKPHOS 71  PROT 6.8  ALBUMIN 3.6    Assessment and Plan:  Right MCA inferior division occlusion s/p 3mg  IA integrelin achieving a TICI 2b revascularization 01/16/2019 by Dr. Corliss Skainseveshwar. Proximal right ICA acute occlusion s/p stent assisted angioplasty 01/16/2019 by Dr. Corliss Skainseveshwar. Patient's condition improving- can spontaneously move all extremities, no gross residual neuro deficits. Right groin incision stable. Continue taking Brilinta 90 mg twice daily and Aspirin 81 mg once daily- will obtain P2Y12 today to check effectiveness of DAPT. Plan to follow-up with Dr. Corliss Skainseveshwar in clinic 4 weeks after discharge. Appreciate and agree with neurology management. NIR to follow.   Electronically Signed: Elwin MochaAlexandra Zanovia Rotz, PA-C 01/16/2019, 8:53 AM   I spent a total of 25 Minutes at the the patient's bedside AND on the  patient's hospital floor or unit, greater than 50% of which was counseling/coordinating care for right MCA inferior division and proximal right ICA occlusions s/p revascularization.

## 2019-01-16 NOTE — Progress Notes (Signed)
  Echocardiogram 2D Echocardiogram has been performed.  Celene Skeen 01/16/2019, 10:33 AM

## 2019-01-16 NOTE — Procedures (Signed)
S/P RT common carotid arteriogram followed by completye revascularization of Rt MCA inf division with 3mg  of IA inteegrelin with TICI 2 B revascularization and Revascularization of symptomatic acute occlusion of prox RT ICA with stent assisted angioplasty with prox flow arrest.

## 2019-01-16 NOTE — Progress Notes (Signed)
OT Cancellation Note  Patient Details Name: Bryan Davies MRN: [id number] DOB: [birth year]   Cancelled Treatment:    Reason Eval/Treat Not Completed: Active bedrest order.  Will check back.  Jeani Hawking, OTR/L Acute Rehabilitation Services Pager [phone] Office [phone]   Jeani Hawking M 01/16/2019, 10:12 AM

## 2019-01-16 NOTE — Progress Notes (Signed)
Patient ID: Bryan Davies, male   DOB: 1951-04-16, 68 y.o.   MRN: [id number] INR. 68Y RH M LSW at 1715 . Acute symptoms of lt sided weakness and slurred speech. MRSS 0. CT brain No ICH ASPECT 10.Given IV TPA. CTA occluded RT ICA prox probably acute with poor filling of RT MCA ? Inf division . Worsening symptoms CT repeat No ICH. ASPECTS ?9. CTP No core though penumbra of . Endovascular revascularization of RT ICA and RT MCA D/W patient.Reasons risks alternatives discussed. Risks of ICH 10 %,worsening neuro deficit,vent dependency,death inability tom  Revascularize all reviewed. Patient expressed understanding and gave witnessed consent to proceed. S.Bernon Arviso MD

## 2019-01-16 NOTE — Plan of Care (Signed)
  Problem: Education: Goal: Knowledge of disease or condition will improve Outcome: Progressing Goal: Knowledge of secondary prevention will improve Outcome: Progressing Goal: Knowledge of patient specific risk factors addressed and post discharge goals established will improve Outcome: Progressing This RN + MDs  spoke in length with pt today about risks of smoking and strategies to help quit. Discussed the fact that pt's significant other also smokes. Utilizing nicotine patch starting today. Pt reports desire to quit.   Problem: Coping: Goal: Will verbalize positive feelings about self Outcome: Progressing Goal: Will identify appropriate support needs Outcome: Progressing   Problem: Nutrition: Goal: Dietary intake will improve Outcome: Progressing   Problem: Ischemic Stroke/TIA Tissue Perfusion: Goal: Complications of ischemic stroke/TIA will be minimized Outcome: Progressing   Problem: Nutrition: Goal: Adequate nutrition will be maintained Outcome: Progressing

## 2019-01-16 NOTE — Evaluation (Signed)
Speech Language Pathology Evaluation Patient Details Name: Bryan Davies MRN: [id number] DOB: 10-02-1951 Today's Date: 01/16/2019 Time: 9390-3009 SLP Time Calculation (min) (ACUTE ONLY): 32 min  Problem List:  Patient Active Problem List   Diagnosis Date Noted  . 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 Ischemic stroke (HCC) 01/15/2019  . Left shoulder pain 12/05/2015  . Urinary hesitancy 12/05/2015  . Pain in lower jaw 07/11/2015  . Hyperlipidemia 02/11/2014  . 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 Dyshydrosis 07/26/2013  . 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 Hypertension 05/21/2012  . Post herpetic neuralgia 05/21/2012  . Bipolar disorder (HCC) 05/21/2012  . Tobacco abuse 05/21/2012   Past Medical History:  Past Medical History:  Diagnosis Date  . Anxiety   . Hypertension   . Shingles 2013  . Shoulder pain    Past Surgical History:  Past Surgical History:  Procedure Laterality Date  . RADIOLOGY WITH ANESTHESIA N/A 01/15/2019   Procedure: IR WITH ANESTHESIA;  Surgeon: Julieanne Cotton, MD;  Location: [facility] OR;  Service: Radiology;  Laterality: N/A;   HPI:  Pt is a 68 y.o. male  With PMH of HTN, COPD, HLD, tobacco abuse, bipolar disorder presents to the emergency department after having a passing out episode. CT of the head revealed progressive loss of gray-white differentiation within the posterior right insula and anterior right temporal lobe. Pt receieved IV TPA. Proximal right ICA acute occlusion s/p stent assisted angioplasty conducted on 01/16/2019   Assessment / Plan / Recommendation Clinical Impression  Pt participated in speech/language/cognition evaluation and he denied any baseline deficits in these areas. He initially denied any changes in cognition but after completion of the assessment, he indicated that his processing speed has been slower since the stroke. [facility] Cognitive Assessment 8.1 was completed to evaluate the pt's  cognitive-linguistic skills. He achieved a score of 18/30 which is below the normal limits of 26 or more out of 30 and is suggestive of a mild-moderate impairment. He demonstrated deficits in the areas of attention, mental manipulation, divergent naming, abstract reasoning, and delayed recall. Skilled SLP services are clinically indicated at this time to improve cognition. Pt, and nursing were educated regarding results and recommendations; both parties verbalized understanding as well as agreement with plan of care.    SLP Assessment  SLP Recommendation/Assessment: Patient needs continued Speech Lanaguage Pathology Services SLP Visit Diagnosis: Cognitive communication deficit (R41.841)    Follow Up Recommendations  (Pt will benefit from continued SLP services )    Frequency and Duration min 2x/week  2 weeks      SLP Evaluation Cognition  Overall Cognitive Status: Within Functional Limits for tasks assessed Arousal/Alertness: Awake/alert Orientation Level: Oriented to person;Oriented to place;Oriented to situation;Disoriented to time(Disoriented to day but oriented to date, month, year) Attention: Focused;Sustained Focused Attention: Impaired Focused Attention Impairment: Verbal complex(Vigilance impaired: 0/1) Sustained Attention: Impaired(Serial 7s: 2/3) Sustained Attention Impairment: Verbal complex Memory: Impaired Memory Impairment: Retrieval deficit;Decreased recall of new information(Immediate: 4/5; Delayed: 2/5; With cues: 5/5) Awareness: Impaired Awareness Impairment: Intellectual impairment Problem Solving: Appears intact Executive Function: Reasoning;Sequencing Reasoning: Impaired Reasoning Impairment: Verbal complex(Abstract: 0/2) Sequencing: Appears intact(Clock drawing: 3/3) Behaviors: Impulsive Safety/Judgment: Appears intact       Comprehension  Auditory Comprehension Overall Auditory Comprehension: Appears within functional limits for tasks assessed Yes/No  Questions: Within Functional Limits Commands: Impaired Complex Commands: (Trail completion: 0/1) Conversation: Complex Reading Comprehension Reading Status: Not tested    Expression Expression Primary Mode of Expression: Verbal Verbal Expression Overall  Verbal Expression: Appears within functional limits for tasks assessed Initiation: No impairment Automatic Speech: Counting;Day of week;Month of year(WNL) Level of Generative/Spontaneous Verbalization: Sentence Repetition: Impaired Level of Impairment: Sentence level(1/2) Naming: No impairment(Confrontational: 3/3) Pragmatics: No impairment Interfering Components: Attention Written Expression Dominant Hand: Right Written Expression: (Difficulty copying cube: 0/1)   Oral / Motor  Motor Speech Overall Motor Speech: Appears within functional limits for tasks assessed Respiration: Within functional limits Phonation: Normal Resonance: Within functional limits Articulation: Within functional limitis Intelligibility: Intelligible Motor Planning: Witnin functional limits Motor Speech Errors: Not applicable   Apolo Cutshaw I. Vear Clock, MS, CCC-SLP Acute Rehabilitation Services Office number [phone] Pager [phone]             [name] 01/16/2019, 3:47 PM

## 2019-01-16 NOTE — Transfer of Care (Signed)
Immediate Anesthesia Transfer of Care Note  Patient: Bryan Davies  Procedure(s) Performed: IR WITH ANESTHESIA (N/A )  Patient Location: PACU  Anesthesia Type:General  Level of Consciousness: awake and alert   Airway & Oxygen Therapy: Patient Spontanous Breathing and Patient connected to face mask oxygen  Post-op Assessment: Report given to RN and Post -op Vital signs reviewed and stable  Post vital signs: Reviewed and stable  Last Vitals:  Vitals Value Taken Time  BP 106/95 01/16/2019  2:05 AM  Temp    Pulse 78 01/16/2019  2:04 AM  Resp 20 01/16/2019  2:05 AM  SpO2 99 % 01/16/2019  2:04 AM  Vitals shown include unvalidated device data.  Last Pain:  Vitals:   01/15/19 2150  TempSrc: Oral  PainSc: 7          Complications: No apparent anesthesia complications

## 2019-01-16 NOTE — Progress Notes (Addendum)
STROKE TEAM PROGRESS NOTE   INTERVAL HISTORY No family is at the bedside.  He became agitated when asked about stroke symptom onset and being with his girlfriend. I have reviewed history of present illness with the patient. He seems to doing quite well after mechanical thrombectomy with practically no neurological deficits. Blood pressure adequately controlled. He is complaining of urinary retention and having a tough time making urine Vitals:   01/16/19 0600 01/16/19 0630 01/16/19 0700 01/16/19 0715  BP: 99/62 98/61 115/65   Pulse: 88 88 86   Resp: Temp:   98.5 F (36.9 C)   TempSrc:   Oral   SpO2: 94% 94% 95% 95%  Weight:      Height:        CBC:  Recent Labs  Lab 01/15/19 2251 01/16/19 0542  WBC 11.3* 12.0*  NEUTROABS 9.7* 9.9*  HGB 15.8 14.0  HCT 46.0 41.8  MCV 89.0 89.9  PLT 176 153    Basic Metabolic Panel:  Recent Labs  Lab 01/15/19 2251 01/16/19 0542  NA 139 142  K 3.8 3.8  CL 104 113*  CO2 21* 20*  GLUCOSE 140* 124*  BUN 23 22  CREATININE 0.90 0.96  CALCIUM 9.2 8.2*   Lipid Panel:     Component Value Date/Time   CHOL 152 01/16/2019 0542   TRIG 107 01/16/2019 0542   HDL 35 (L) 01/16/2019 0542   CHOLHDL 4.3 01/16/2019 0542   VLDL 21 01/16/2019 0542   LDLCALC 96 01/16/2019 0542   HgbA1c:  Lab Results  Component Value Date   HGBA1C 6.6 (H) 01/16/2019   Urine Drug Screen:     Component Value Date/Time   LABOPIA NONE DETECTED 01/15/2019 2003   COCAINSCRNUR NONE DETECTED 01/15/2019 2003   LABBENZ NONE DETECTED 01/15/2019 2003   AMPHETMU NONE DETECTED 01/15/2019 2003   THCU NONE DETECTED 01/15/2019 2003   LABBARB NONE DETECTED 01/15/2019 2003    Alcohol Level     Component Value Date/Time   ETH <10 01/15/2019 2251    IMAGING Ct Angio Head W Or Wo Contrast  Result Date: 01/15/2019 CLINICAL DATA:  Stroke follow-up. EXAM: CT ANGIOGRAPHY HEAD AND NECK TECHNIQUE: Multidetector CT imaging of the head and neck was performed using the  standard protocol during bolus administration of intravenous contrast. Multiplanar CT image reconstructions and MIPs were obtained to evaluate the vascular anatomy. Carotid stenosis measurements (when applicable) are obtained utilizing NASCET criteria, using the distal internal carotid diameter as the denominator. CONTRAST:  75mL OMNIPAQUE IOHEXOL 350 MG/ML SOLN COMPARISON:  Head CT 01/15/2019 FINDINGS: CTA NECK FINDINGS SKELETON: There is no bony spinal canal stenosis. No lytic or blastic lesion. OTHER NECK: Normal pharynx, larynx and major salivary glands. No cervical lymphadenopathy. Unremarkable thyroid gland. UPPER CHEST: No pneumothorax or pleural effusion. No nodules or masses. AORTIC ARCH: There is no calcific atherosclerosis of the aortic arch. There is no aneurysm, dissection or hemodynamically significant stenosis of the visualized ascending aorta and aortic arch. Conventional 3 vessel aortic branching pattern. There is atherosclerotic plaque within the proximal arch vessels. No high-grade stenosis. RIGHT CAROTID SYSTEM: --Common carotid artery: There is predominantly noncalcified plaque within the distal right common carotid artery causing approximately 60% stenosis. --Internal carotid artery: Occluded at its origin. There is return of opacification at the proximal petrous segment. --External carotid artery: No acute abnormality. LEFT CAROTID SYSTEM: --Common carotid artery: Widely patent origin without common carotid artery dissection or aneurysm. --Internal carotid artery: No dissection,  occlusion or aneurysm. Mild atherosclerotic disease at the carotid bifurcation without hemodynamically significant stenosis. --External carotid artery: No acute abnormality. VERTEBRAL ARTERIES: Right dominant configuration. Both origins are normal. No dissection, occlusion or flow-limiting stenosis to the vertebrobasilar confluence. CTA HEAD FINDINGS POSTERIOR CIRCULATION: --Vertebral arteries: Normal right dominant  configuration of V4 segments. --Posterior inferior cerebellar arteries (PICA): Patent origins from the vertebral arteries. --Anterior inferior cerebellar arteries (AICA): Patent origins from the basilar artery. --Basilar artery: Normal. --Superior cerebellar arteries: Normal. --Posterior cerebral arteries (PCA): Normal. Both originate from the basilar artery. Posterior communicating arteries (p-comm) are diminutive or absent. ANTERIOR CIRCULATION: --Intracranial internal carotid arteries: There is diminished opacification of the right ICA at the skull base. Atherosclerotic calcification of the left ICA. --Anterior cerebral arteries (ACA): Normal. Both A1 segments are present. Patent anterior communicating artery (a-comm). --Middle cerebral arteries (MCA): Normal. VENOUS SINUSES: As permitted by contrast timing, patent. ANATOMIC VARIANTS: None DELAYED PHASE: Not performed. Review of the MIP images confirms the above findings. IMPRESSION: 1. 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 2. The right MCA is patent, but there is diminished enhancement within the right MCA territory, possibly secondary to occlusion of small distal branches. 3. Critical Value/emergent results were called by telephone at the time of interpretation on 01/15/2019 at 8:54 pm to Dr. Arther Dames , who verbally acknowledged these results. Electronically Signed   By: Deatra Robinson M.D.   On: 01/15/2019 20:55   Ct Head Wo Contrast  Result Date: 01/15/2019 CLINICAL DATA:  Left-sided weakness. EXAM: CT PERFUSION BRAIN CT HEAD WITHOUT CONTRAST TECHNIQUE: Multiphase CT imaging of the brain was performed following IV bolus contrast injection. Subsequent parametric perfusion maps were calculated using RAPID software. CONTRAST:  40mL ISOVUE-370 IOPAMIDOL (ISOVUE-370) INJECTION 76% COMPARISON:  None. FINDINGS: CT head findings: Brain: There is no mass, hemorrhage or  extra-axial collection. The size and configuration of the ventricles and extra-axial CSF spaces are normal. There is poor gray-white differentiation of the posterior right insular cortex and within the anterior right temporal lobe. Vascular: No abnormal hyperdensity of the major intracranial arteries or dural venous sinuses. No intracranial atherosclerosis. Skull: The visualized skull base, calvarium and extracranial soft tissues are normal. Sinuses/Orbits: No fluid levels or advanced mucosal thickening of the visualized paranasal sinuses. No mastoid or middle ear effusion. The orbits are normal. ASPECTS (Alberta Stroke Program Early CT Score) - Ganglionic level infarction (caudate, lentiform nuclei, internal capsule, insula, M1-M3 cortex): 5 - Supraganglionic infarction (M4-M6 cortex): 3 Total score (0-10 with 10 being normal): 8 CT Brain Perfusion Findings: CBF (<30%) Volume: 0mL Perfusion (Tmax>6.0s) volume: Mismatch Volume: Infarct Core: 0 mL Infarction Location:No demonstrable infarct by CT perfusion criteria. IMPRESSION: 1. Progressive loss of gray-white differentiation within the posterior right insula and anterior right temporal lobe. ASPECTS is 8. 2. No intracranial hemorrhage. 3. CT perfusion analysis demonstrating ischemic penumbra of 104 mL. Though there is no core infarct demonstrated by the perfusion scan, the early changes on the noncontrast portion of the study suggest that there most likely is a small core, though clearly smaller than the at risk region. These results were called by telephone at the time of interpretation on 01/15/2019 at 10:41 pm to Dr. Arther Dames , who verbally acknowledged these results. Electronically Signed   By: Deatra Robinson M.D.   On: 01/15/2019 22:41   Ct Angio Neck W Or Wo Contrast  Result Date: 01/15/2019 CLINICAL DATA:  Stroke  follow-up. EXAM: CT ANGIOGRAPHY HEAD AND NECK TECHNIQUE: Multidetector CT imaging of the head and neck was performed using the  standard protocol during bolus administration of intravenous contrast. Multiplanar CT image reconstructions and MIPs were obtained to evaluate the vascular anatomy. Carotid stenosis measurements (when applicable) are obtained utilizing NASCET criteria, using the distal internal carotid diameter as the denominator. CONTRAST:  75mL OMNIPAQUE IOHEXOL 350 MG/ML SOLN COMPARISON:  Head CT 01/15/2019 FINDINGS: CTA NECK FINDINGS SKELETON: There is no bony spinal canal stenosis. No lytic or blastic lesion. OTHER NECK: Normal pharynx, larynx and major salivary glands. No cervical lymphadenopathy. Unremarkable thyroid gland. UPPER CHEST: No pneumothorax or pleural effusion. No nodules or masses. AORTIC ARCH: There is no calcific atherosclerosis of the aortic arch. There is no aneurysm, dissection or hemodynamically significant stenosis of the visualized ascending aorta and aortic arch. Conventional 3 vessel aortic branching pattern. There is atherosclerotic plaque within the proximal arch vessels. No high-grade stenosis. RIGHT CAROTID SYSTEM: --Common carotid artery: There is predominantly noncalcified plaque within the distal right common carotid artery causing approximately 60% stenosis. --Internal carotid artery: Occluded at its origin. There is return of opacification at the proximal petrous segment. --External carotid artery: No acute abnormality. LEFT CAROTID SYSTEM: --Common carotid artery: Widely patent origin without common carotid artery dissection or aneurysm. --Internal carotid artery: No dissection, occlusion or aneurysm. Mild atherosclerotic disease at the carotid bifurcation without hemodynamically significant stenosis. --External carotid artery: No acute abnormality. VERTEBRAL ARTERIES: Right dominant configuration. Both origins are normal. No dissection, occlusion or flow-limiting stenosis to the vertebrobasilar confluence. CTA HEAD FINDINGS POSTERIOR CIRCULATION: --Vertebral arteries: Normal right dominant  configuration of V4 segments. --Posterior inferior cerebellar arteries (PICA): Patent origins from the vertebral arteries. --Anterior inferior cerebellar arteries (AICA): Patent origins from the basilar artery. --Basilar artery: Normal. --Superior cerebellar arteries: Normal. --Posterior cerebral arteries (PCA): Normal. Both originate from the basilar artery. Posterior communicating arteries (p-comm) are diminutive or absent. ANTERIOR CIRCULATION: --Intracranial internal carotid arteries: There is diminished opacification of the right ICA at the skull base. Atherosclerotic calcification of the left ICA. --Anterior cerebral arteries (ACA): Normal. Both A1 segments are present. Patent anterior communicating artery (a-comm). --Middle cerebral arteries (MCA): Normal. VENOUS SINUSES: As permitted by contrast timing, patent. ANATOMIC VARIANTS: None DELAYED PHASE: Not performed. Review of the MIP images confirms the above findings. IMPRESSION: 1. 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 2. The right MCA is patent, but there is diminished enhancement within the right MCA territory, possibly secondary to occlusion of small distal branches. 3. Critical Value/emergent results were called by telephone at the time of interpretation on 01/15/2019 at 8:54 pm to Dr. Arther DamesSUSHANTH AROOR , who verbally acknowledged these results. Electronically Signed   By: Deatra RobinsonKevin  Herman M.D.   On: 01/15/2019 20:55   Ct Cerebral Perfusion W Contrast  Result Date: 01/15/2019 CLINICAL DATA:  Left-sided weakness. EXAM: CT PERFUSION BRAIN CT HEAD WITHOUT CONTRAST TECHNIQUE: Multiphase CT imaging of the brain was performed following IV bolus contrast injection. Subsequent parametric perfusion maps were calculated using RAPID software. CONTRAST:  40mL ISOVUE-370 IOPAMIDOL (ISOVUE-370) INJECTION 76% COMPARISON:  None. FINDINGS: CT head findings: Brain: There is no mass,  hemorrhage or extra-axial collection. The size and configuration of the ventricles and extra-axial CSF spaces are normal. There is poor gray-white differentiation of the posterior right insular cortex and within the anterior right temporal lobe. Vascular: No abnormal hyperdensity of the major intracranial arteries or dural  venous sinuses. No intracranial atherosclerosis. Skull: The visualized skull base, calvarium and extracranial soft tissues are normal. Sinuses/Orbits: No fluid levels or advanced mucosal thickening of the visualized paranasal sinuses. No mastoid or middle ear effusion. The orbits are normal. ASPECTS (Alberta Stroke Program Early CT Score) - Ganglionic level infarction (caudate, lentiform nuclei, internal capsule, insula, M1-M3 cortex): 5 - Supraganglionic infarction (M4-M6 cortex): 3 Total score (0-10 with 10 being normal): 8 CT Brain Perfusion Findings: CBF (<30%) Volume: 80mL Perfusion (Tmax>6.0s) volume: Mismatch Volume: Infarct Core: 0 mL Infarction Location:No demonstrable infarct by CT perfusion criteria. IMPRESSION: 1. Progressive loss of gray-white differentiation within the posterior right insula and anterior right temporal lobe. ASPECTS is 8. 2. No intracranial hemorrhage. 3. CT perfusion analysis demonstrating ischemic penumbra of 104 mL. Though there is no core infarct demonstrated by the perfusion scan, the early changes on the noncontrast portion of the study suggest that there most likely is a small core, though clearly smaller than the at risk region. These results were called by telephone at the time of interpretation on 01/15/2019 at 10:41 pm to Dr. Arther Dames , who verbally acknowledged these results. Electronically Signed   By: Deatra Robinson M.D.   On: 01/15/2019 22:41   Ct Head Code Stroke Wo Contrast  Result Date: 01/15/2019 CLINICAL DATA:  Code stroke.  Left-sided weakness.  Stroke EXAM: CT HEAD WITHOUT CONTRAST TECHNIQUE: Contiguous axial images were  obtained from the base of the skull through the vertex without intravenous contrast. COMPARISON:  CT head 10/20/2018 FINDINGS: Brain: Mild atrophy. Negative for acute infarct, hemorrhage, or mass. Vascular: Hyperdense right MCA compatible with acute thrombosis. This was not seen previously. No other hyperdense vessel. Skull: Negative Sinuses/Orbits: Negative Other: None ASPECTS (Alberta Stroke Program Early CT Score) - Ganglionic level infarction (caudate, lentiform nuclei, internal capsule, insula, M1-M3 cortex): 7 - Supraganglionic infarction (M4-M6 cortex): 3 Total score (0-10 with 10 being normal): 10 IMPRESSION: 1. Hyperdense right MCA compatible with acute thrombosis. Negative for acute infarct or hemorrhage 2. ASPECTS is 10 3. These results were called by telephone at the time of interpretation on 01/15/2019 at 8:26 pm to Dr. Laurence Slate, who verbally acknowledged these results. Electronically Signed   By: Marlan Palau M.D.   On: 01/15/2019 20:27   Cerebral angio S/P RT common carotid arteriogram followed by completye revascularization of Rt MCA inf division with 3mg  of IA inteegrelin with TICI 2 B revascularization and Revascularization of symptomatic acute occlusion of prox RT ICA with stent assisted angioplasty with prox flow arrest.  PHYSICAL EXAM Middle-aged Caucasian male not in distress. . Afebrile. Head is nontraumatic. Neck is supple without bruit.    Cardiac exam no murmur or gallop. Lungs are clear to auscultation. Distal pulses are well felt. Neurological Exam ;  Awake  Alert oriented x 3. Normal speech and language.eye movements full without nystagmus.fundi were not visualized. Vision acuity and fields appear normal. Hearing is normal. Palatal movements are normal. Face symmetric. Tongue midline. Normal strength, tone, reflexes and coordination.slightly diminished fine finger movements on the left. Orbits right over left upper extremity. Normal sensation. Gait deferred.  NIH SS  0  ASSESSMENT/PLAN Mr. Bryan Davies is a 68 y.o. male with history of HTN, COPD, HLD, tobacco abuse, bipolar disorder presenting after having passed out after stiffening. CPR. Found to have facial droop, LUE drift in ED. CTA showed R ICA occlusion. Sent to IR with RI ICA stent placement and TICI 2b revascularization of R MCA inferior branch.  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 ASPECTS 10.     CTA head & neck occlusion R ICA. Diminished R MCA flow.   CT head 2229 progressive loss gray-white differentiation posterior R insula and anterior R temporal lobe. APECTS 8. No ICH.  CT perfusion penumbra . No core infarct  Cerebral angio R MCA inf division revascularization TICI 2b w/ Integrilin, R ICA stent assisted angioplasty w/ prox flow arrest  Post IR CT no ICH, mass effect or shift  MRI  pending   2D Echo w/ bubble  pending   LDL 96  HgbA1c 6.6  SCDs for VTE prophylaxis Diet Order            Diet NPO time specified  Diet effective now              No antithrombotic prior to admission, now on aspirin 81 mg daily and Brilinta (ticagrelor) 90 mg bid. continue at d/c   Therapy recommendations:  pending   Disposition:  pending   Hypertension  Home meds: norvasc  Stable . Permissive hypertension (OK if < 220/120) but gradually normalize in 5-7 days . Resumed home meds . Long-term BP goal normotensive  Hyperlipidemia  Home meds:  lipitor 40, resumed in hospital  LDL 96, goal < 70  Continue statin at discharge  Other Stroke Risk Factors  Advanced age  Cigarette smoker, advised to stop smoking. Nicotine patch added  Other Active Problems  Leukocytosis 12.0 . UA neg. Check CXR. Repeat labs am  BPH w/ Urinary retention on flomax PTA. Difficulty voiding. To check residuals, place foley if  appropriate.  Hospital day # 1  Annie Main, MSN, APRN, ANVP-BC, AGPCNP-BC Advanced Practice Stroke Nurse [facility] See Amion for Schedule & Pager information 01/16/2019 11:01 AM  I have personally obtained history,examined this patient, reviewed notes, independently viewed imaging studies, participated in medical decision making and plan of care.ROS completed by me personally and pertinent positives fully documented  I have made any additions or clarifications directly to the above note. Agree with note above. He presented with left hemiplegia due to right carotid occlusion and underwent treatment with IV TPA followed by mechanical thrombectomy with placement of a rescue right ICA stent. He is doing quite well clinically. Recommend in and out bladder catheterization for urinary retention. Resume home medications including Flomax.Continue aspirin and Brilinta for carotid stent. Patient counseled to quit smoking and is agreeable This patient is critically ill and at significant risk of neurological worsening, death and care requires constant monitoring of vital signs, hemodynamics,respiratory and cardiac monitoring, extensive review of multiple databases, frequent neurological assessment, discussion with family, other specialists and medical decision making of high complexity.I have made any additions or clarifications directly to the above note.This critical care time does not reflect procedure time, or teaching time or supervisory time of PA/NP/Med Resident etc but could involve care discussion time.  I spent 35 minutes of neurocritical care time  in the care of  this patient.     Delia Heady, MD Medical Director [facility] Stroke Center Pager: [phone] 01/16/2019 12:32 PM  To contact Stroke Continuity provider, please refer to [email or web address]. After hours, contact General Neurology

## 2019-01-16 NOTE — Progress Notes (Signed)
Pt's SBP has been in the low 100's, lower than the desired 120-140 parameter. MD Pearlean Brownie with neurology aware and at bedside initially, bolus of normal saline ordered. 12pm BP read is 96/56. Paged PA Elwin Mocha with IR to inform. Pt is asymptomatic at this time. Per PA Alex, keep pt well hydrated. If pt becomes symptomatic, let stroke team know. Will continue to monitor patient.

## 2019-01-16 NOTE — Progress Notes (Signed)
SBP<120 and cleviprex gtt off for more than 30 minutes. Paged Dr. Laurence Slate. Instructed to give more time for cleviprex gtt to get out of system. Patient asymptomatic and no neuro changes.

## 2019-01-16 NOTE — Progress Notes (Signed)
PT Cancellation Note  Patient Details Name: Bryan Davies MRN: [id number] DOB: 1951/05/11   Cancelled Treatment:    Reason Eval/Treat Not Completed: (P) Active bedrest order Pt is on 24 hour bedrest post IR. PT will follow back tomorrow for evaluation.  Katieann Hungate B. Beverely Risen PT, DPT Acute Rehabilitation Services Pager [phone] Office [phone]    Elon Alas Fleet 01/16/2019, 10:08 AM

## 2019-01-16 NOTE — Progress Notes (Signed)
Patient ID: Bryan Davies, male   DOB: Nov 30, 1950, 68 y.o.   MRN: [id number] INR  Post procedure.CT Brain no ICH ,mass effect or shift. Extubated Moving all 4s. maintaing O2 sats. RT groin soft. Distal pulses DP s and PTS  palpable bilaterally. S.Marvetta Vohs MD

## 2019-01-16 NOTE — Anesthesia Postprocedure Evaluation (Signed)
Anesthesia Post Note  Patient: Bryan Davies  Procedure(s) Performed: IR WITH ANESTHESIA (N/A )     Patient location during evaluation: PACU Anesthesia Type: General Level of consciousness: awake and alert Pain management: pain level controlled Vital Signs Assessment: post-procedure vital signs reviewed and stable Respiratory status: spontaneous breathing, nonlabored ventilation and respiratory function stable Cardiovascular status: blood pressure returned to baseline and stable Postop Assessment: no apparent nausea or vomiting Anesthetic complications: no    Last Vitals:  Vitals:   01/16/19 0700 01/16/19 0715  BP: 115/65   Pulse: 86   Resp: 15   Temp: 36.9 C   SpO2: 95% 95%    Last Pain:  Vitals:   01/16/19 0700  TempSrc: Oral  PainSc:                  [name],W. EDMOND

## 2019-01-17 ENCOUNTER — Inpatient Hospital Stay (HOSPITAL_COMMUNITY): Payer: Medicare PPO

## 2019-01-17 DIAGNOSIS — F17200 Nicotine dependence, unspecified, uncomplicated: Secondary | ICD-10-CM

## 2019-01-17 DIAGNOSIS — D72829 Elevated white blood cell count, unspecified: Secondary | ICD-10-CM

## 2019-01-17 DIAGNOSIS — E785 Hyperlipidemia, unspecified: Secondary | ICD-10-CM

## 2019-01-17 DIAGNOSIS — I6601 Occlusion and stenosis of right middle cerebral artery: Secondary | ICD-10-CM

## 2019-01-17 LAB — CBC
HCT: 37.6 % — ABNORMAL LOW (ref 39.0–52.0)
HEMOGLOBIN: 12.5 g/dL — AB (ref 13.0–17.0)
MCH: 29.7 pg (ref 26.0–34.0)
MCHC: 33.2 g/dL (ref 30.0–36.0)
MCV: 89.3 fL (ref 80.0–100.0)
Platelets: 137 10*3/uL — ABNORMAL LOW (ref 150–400)
RBC: 4.21 MIL/uL — ABNORMAL LOW (ref 4.22–5.81)
RDW: 12.6 % (ref 11.5–15.5)
WBC: 9.5 10*3/uL (ref 4.0–10.5)
nRBC: 0 % (ref 0.0–0.2)

## 2019-01-17 LAB — BASIC METABOLIC PANEL
Anion gap: 8 (ref 5–15)
BUN: 16 mg/dL (ref 8–23)
CO2: 20 mmol/L — ABNORMAL LOW (ref 22–32)
Calcium: 8 mg/dL — ABNORMAL LOW (ref 8.9–10.3)
Chloride: 111 mmol/L (ref 98–111)
Creatinine, Ser: 1.07 mg/dL (ref 0.61–1.24)
GFR calc Af Amer: >60 mL/min (ref >60)
GFR calc non Af Amer: >60 mL/min (ref >60)
Glucose, Bld: 144 mg/dL — ABNORMAL HIGH (ref 70–99)
Potassium: 3.5 mmol/L (ref 3.5–5.1)
Sodium: 139 mmol/L (ref 135–145)

## 2019-01-17 LAB — GLUCOSE, CAPILLARY: Glucose-Capillary: 129 mg/dL — ABNORMAL HIGH (ref 70–99)

## 2019-01-17 MED ORDER — IPRATROPIUM-ALBUTEROL 0.5-2.5 (3) MG/3ML IN SOLN
3.0000 mL | Freq: Two times a day (BID) | RESPIRATORY_TRACT | Status: DC
  Administered 2019-01-18: 3 mL via RESPIRATORY_TRACT
  Filled 2019-01-17: qty 3

## 2019-01-17 MED ORDER — PANTOPRAZOLE SODIUM 40 MG PO TBEC
40.0000 mg | DELAYED_RELEASE_TABLET | Freq: Every day | ORAL | Status: DC
  Administered 2019-01-17 – 2019-01-18 (×2): 40 mg via ORAL
  Filled 2019-01-17 (×2): qty 1

## 2019-01-17 MED ORDER — IPRATROPIUM BROMIDE 0.02 % IN SOLN
0.5000 mg | Freq: Four times a day (QID) | RESPIRATORY_TRACT | Status: DC
  Administered 2019-01-17: 0.5 mg via RESPIRATORY_TRACT
  Filled 2019-01-17: qty 2.5

## 2019-01-17 MED ORDER — BISACODYL 5 MG PO TBEC
10.0000 mg | DELAYED_RELEASE_TABLET | Freq: Every day | ORAL | Status: DC | PRN
  Administered 2019-01-17: 10 mg via ORAL
  Filled 2019-01-17: qty 2

## 2019-01-17 NOTE — Evaluation (Signed)
Physical Therapy Evaluation Patient Details Name: Bryan Davies MRN: [id number] DOB: 08-17-1951 Today's Date: 01/17/2019   History of Present Illness  68 y.o. male with history of HTN, COPD, HLD, tobacco abuse, bipolar disorder presenting after having passed out after stiffening. CPR. Found to have facial droop, LUE drift in ED. CTA showed R ICA occlusion. Sent to IR with RI ICA stent placement and TICI 2b revascularization of R MCA inferior branch.      Clinical Impression  Pt admitted with above diagnosis. Pt currently with functional limitations due to the deficits listed below (see PT Problem List). PTA pt independent. On eval, he required supervision transfers and min guard assist ambulation 200 feet without AD. Pt plans to discharge to his girlfriend's 2nd floor apt. He will have 1 fight of stairs with L rail to enter. Girlfriend is able to provide 24-hour assist. Pt will benefit from skilled PT to increase their independence and safety with mobility to allow discharge to the venue listed below.       Follow Up Recommendations Outpatient PT;Supervision - Intermittent    Equipment Recommendations  Cane    Recommendations for Other Services       Precautions / Restrictions Precautions Precautions: Fall      Mobility  Bed Mobility Overal bed mobility: Independent                Transfers Overall transfer level: Needs assistance Equipment used: None Transfers: Sit to/from Stand;Stand Pivot Transfers Sit to Stand: Supervision Stand pivot transfers: Supervision       General transfer comment: supervision for safety  Ambulation/Gait Ambulation/Gait assistance: Min guard Gait Distance (Feet): 200 Feet Assistive device: None Gait Pattern/deviations: WFL(Within Functional Limits) Gait velocity: WNL Gait velocity interpretation: >2.62 ft/sec, indicative of community ambulatory General Gait Details: min guard assist for safety. Mildly unsteady during episode of  coughing but able to maintain balance with min guard.  Stairs            Wheelchair Mobility    Modified Rankin (Stroke Patients Only) Modified Rankin (Stroke Patients Only) Pre-Morbid Rankin Score: No symptoms Modified Rankin: No significant disability     Balance Overall balance assessment: Needs assistance Sitting-balance support: Feet unsupported;No upper extremity supported Sitting balance-Leahy Scale: Normal     Standing balance support: No upper extremity supported;During functional activity Standing balance-Leahy Scale: Good                               Pertinent Vitals/Pain Pain Assessment: No/denies pain    Home Living Family/patient expects to be discharged to:: Private residence Living Arrangements: Spouse/significant other Available Help at Discharge: Friend(s);Available 24 hours/day Type of Home: Apartment Home Access: Stairs to enter Entrance Stairs-Rails: Left Entrance Stairs-Number of Steps: flight Home Layout: One level Home Equipment: None      Prior Function Level of Independence: Independent               Hand Dominance   Dominant Hand: Right    Extremity/Trunk Assessment   Upper Extremity Assessment Upper Extremity Assessment: Defer to OT evaluation    Lower Extremity Assessment Lower Extremity Assessment: Overall WFL for tasks assessed(symmetrical)    Cervical / Trunk Assessment Cervical / Trunk Assessment: Kyphotic  Communication   Communication: No difficulties  Cognition Arousal/Alertness: Awake/alert Behavior During Therapy: Impulsive;WFL for tasks assessed/performed Overall Cognitive Status: Within Functional Limits for tasks assessed  General Comments: decreased safety awareness      General Comments      Exercises     Assessment/Plan    PT Assessment Patient needs continued PT services  PT Problem List Decreased balance;Decreased  mobility;Decreased knowledge of use of DME;Decreased activity tolerance;Decreased safety awareness       PT Treatment Interventions DME instruction;Functional mobility training;Balance training;Patient/family education;Gait training;Therapeutic activities;Stair training    PT Goals (Current goals can be found in the Care Plan section)  Acute Rehab PT Goals Patient Stated Goal: home PT Goal Formulation: With patient Time For Goal Achievement: 01/31/19 Potential to Achieve Goals: Good    Frequency Min 4X/week   Barriers to discharge        Co-evaluation               AM-PAC PT "6 Clicks" Mobility  Outcome Measure Help needed turning from your back to your side while in a flat bed without using bedrails?: None Help needed moving from lying on your back to sitting on the side of a flat bed without using bedrails?: None Help needed moving to and from a bed to a chair (including a wheelchair)?: None Help needed standing up from a chair using your arms (e.g., wheelchair or bedside chair)?: None Help needed to walk in hospital room?: A Little Help needed climbing 3-5 steps with a railing? : A Little 6 Click Score: 22    End of Session Equipment Utilized During Treatment: Gait belt Activity Tolerance: Patient tolerated treatment well Patient left: in bed;with call bell/phone within reach;with bed alarm set;with family/visitor present Nurse Communication: Mobility status PT Visit Diagnosis: Difficulty in walking, not elsewhere classified (R26.2)    Time: 8110-3159 PT Time Calculation (min) (ACUTE ONLY): 23 min   Charges:   PT Evaluation $PT Eval Moderate Complexity: 1 Mod PT Treatments $Gait Training: 8-22 mins        Aida Raider, PT  Office # [phone] Pager [phone]   Ilda Foil 01/17/2019, 10:06 AM

## 2019-01-17 NOTE — Evaluation (Signed)
Occupational Therapy Evaluation Patient Details Name: Bryan Davies MRN: [id number] DOB: 10-Feb-1951 Today's Date: 01/17/2019    History of Present Illness 68 y.o. male with history of HTN, COPD, HLD, tobacco abuse, bipolar disorder presenting after having passed out after stiffening. CPR. Found to have facial droop, LUE drift in ED. CTA showed R ICA occlusion. Sent to IR with RI ICA stent placement and TICI 2b revascularization of R MCA inferior branch.     Clinical Impression   PTA patient reports independent and driving.  Admitted for above and limited by L sided weakness, impaired cognition (decreased problem solving, attention, sequencing, awareness to deficits, and short term memory), decreased activity tolerance, and impaired safety awareness. Pt requires supervision for basic transfers, supervision for grooming standing at sink, supervision for UB/LB ADLs with cueing for safety and techniques.  Patient will have 24/7 support of significant other at discharge. Recommend further functional cognitive assessment using pill box test. Patient will benefit from continued OT services while admitted and after dc at OP OT level in order to optimize independence and safety with ADLs/mobility.     Follow Up Recommendations  Supervision/Assistance - 24 hour;Outpatient OT    Equipment Recommendations  3 in 1 bedside commode    Recommendations for Other Services       Precautions / Restrictions Precautions Precautions: Fall Restrictions Weight Bearing Restrictions: No      Mobility Bed Mobility Overal bed mobility: Independent                Transfers Overall transfer level: Needs assistance Equipment used: None Transfers: Sit to/from Stand Sit to Stand: Supervision         General transfer comment: supervision for safety    Balance Overall balance assessment: Needs assistance Sitting-balance support: Feet unsupported;No upper extremity supported Sitting balance-Leahy  Scale: Normal     Standing balance support: No upper extremity supported;During functional activity Standing balance-Leahy Scale: Good                             ADL either performed or assessed with clinical judgement   ADL Overall ADL's : Needs assistance/impaired     Grooming: Wash/dry hands;Oral care;Supervision/safety;Standing   Upper Body Bathing: Set up;Sitting   Lower Body Bathing: Sit to/from stand;Min guard Lower Body Bathing Details (indicate cue type and reason): cueing to complete bathing without assist from girlfriend Upper Body Dressing : Set up;Sitting   Lower Body Dressing: Sit to/from stand;Supervision/safety Lower Body Dressing Details (indicate cue type and reason): able to manage socks sitting, supervision in standing  Toilet Transfer: Supervision/safety;Ambulation Toilet Transfer Details (indicate cue type and reason): simulated to arm chair in room Toileting- Clothing Manipulation and Hygiene: Supervision/safety;Sit to/from stand Toileting - Clothing Manipulation Details (indicate cue type and reason): reports "missing" urinal in bed, able to void in toilet standing with supervision     Functional mobility during ADLs: Supervision/safety       Vision         Perception     Praxis      Pertinent Vitals/Pain Pain Assessment: No/denies pain     Hand Dominance Right   Extremity/Trunk Assessment Upper Extremity Assessment Upper Extremity Assessment: LUE deficits/detail LUE Deficits / Details: grossly 3+/5 MMT (compared to 5/5 L side), dysmetric LUE Sensation: WNL LUE Coordination: decreased fine motor;decreased gross motor   Lower Extremity Assessment Lower Extremity Assessment: Defer to PT evaluation   Cervical / Trunk Assessment Cervical / Trunk  Assessment: Kyphotic   Communication Communication Communication: No difficulties   Cognition Arousal/Alertness: Awake/alert Behavior During Therapy: Impulsive;WFL for tasks  assessed/performed Overall Cognitive Status: Impaired/Different from baseline Area of Impairment: Attention;Memory;Safety/judgement;Awareness;Problem solving                   Current Attention Level: Sustained Memory: Decreased short-term memory   Safety/Judgement: Decreased awareness of deficits;Decreased awareness of safety Awareness: Emergent Problem Solving: Slow processing;Decreased initiation;Difficulty sequencing;Requires verbal cues General Comments: decreased safety awareness, poor awareness to deficits and short blessed test completed scoring 15/28 with deficits in sequencing, attention and short term memory   General Comments  significant other present and supportive     Exercises     Shoulder Instructions      Home Living Family/patient expects to be discharged to:: Private residence Living Arrangements: Spouse/significant other Available Help at Discharge: Friend(s);Available 24 hours/day Type of Home: Apartment Home Access: Stairs to enter [employer] of Steps: flight Entrance Stairs-Rails: Left Home Layout: One level     Bathroom Shower/Tub: IT trainer: Standard     Home Equipment: None          Prior Functioning/Environment Level of Independence: Independent        Comments: reports independent ADLs, IADLs, driving and managing medication        OT Problem List: Decreased strength;Decreased activity tolerance;Decreased coordination;Decreased safety awareness;Decreased knowledge of use of DME or AE;Decreased knowledge of precautions;Decreased cognition      OT Treatment/Interventions: Self-care/ADL training;Neuromuscular education;DME and/or AE instruction;Therapeutic activities;Cognitive remediation/compensation;Patient/family education;Balance training    OT Goals(Current goals can be found in the care plan section) Acute Rehab OT Goals Patient Stated Goal: home OT Goal Formulation: With  patient Time For Goal Achievement: 01/31/19 Potential to Achieve Goals: Good  OT Frequency: Min 2X/week   Barriers to D/C:            Co-evaluation              AM-PAC OT "6 Clicks" Daily Activity     Outcome Measure Help from another person eating meals?: None Help from another person taking care of personal grooming?: None Help from another person toileting, which includes using toliet, bedpan, or urinal?: None Help from another person bathing (including washing, rinsing, drying)?: A Little Help from another person to put on and taking off regular upper body clothing?: None Help from another person to put on and taking off regular lower body clothing?: A Little 6 Click Score: 22   End of Session Nurse Communication: Mobility status  Activity Tolerance: Patient tolerated treatment well Patient left: in bed;with call bell/phone within reach;with nursing/sitter in room;with family/visitor present  OT Visit Diagnosis: Other abnormalities of gait and mobility (R26.89);Muscle weakness (generalized) (M62.81);Other symptoms and signs involving cognitive function                Time: 6803-2122 OT Time Calculation (min): 20 min Charges:  OT General Charges $OT Visit: 1 Visit OT Evaluation $OT Eval Moderate Complexity: 1 Mod  Chancy Milroy, OT Acute Rehabilitation Services Pager [phone] Office [phone]   Chancy Milroy 01/17/2019, 3:55 PM

## 2019-01-17 NOTE — Progress Notes (Signed)
Referring Physician(s): Aroor,S  Supervising Physician: Julieanne Cotton  Patient Status:  [facility] - In-pt  Chief Complaint: Right sided weakness   Subjective: Pt doing ok today; reports no new neurological c/o    Allergies: Pollen extract  Medications: Prior to Admission medications   Medication Sig Start Date End Date Taking? Authorizing Provider  acetaminophen (TYLENOL) 500 MG tablet Take 500-1,000 mg by mouth every 6 (six) hours as needed for mild pain, moderate pain or headache.    Yes [provider]  amLODipine (NORVASC) 5 MG tablet Take 1 tablet (5 mg total) by mouth daily. 07/25/16  Yes Garth Bigness, MD  ibuprofen (ADVIL,MOTRIN) 200 MG tablet Take 200-400 mg by mouth every 6 (six) hours as needed for headache, mild pain or moderate pain.    Yes [provider]  terbinafine (LAMISIL) 250 MG tablet Take 250 mg by mouth daily. 12/22/18  Yes [provider]     Vital Signs: BP 116/66 (BP Location: Right Arm)   Pulse 75   Temp 99.4 F (37.4 C) (Oral)   Resp 18   Ht  (1.702 m)   Wt 156 lb 15.5 oz (71.2 kg)   SpO2 99%   BMI 24.58 kg/m   Physical Exam awake/alert; moving all fours ok; denies visual diff or paresthesias; rt groin access site with small amount of ecchymosis, NT; no distinct hematoma  Imaging: Ct Angio Head W Or Wo Contrast  Result Date: 01/15/2019 CLINICAL DATA:  Stroke follow-up. EXAM: CT ANGIOGRAPHY HEAD AND NECK TECHNIQUE: Multidetector CT imaging of the head and neck was performed using the standard protocol during bolus administration of intravenous contrast. Multiplanar CT image reconstructions and MIPs were obtained to evaluate the vascular anatomy. Carotid stenosis measurements (when applicable) are obtained utilizing NASCET criteria, using the distal internal carotid diameter as the denominator. CONTRAST:  75mL OMNIPAQUE IOHEXOL 350 MG/ML SOLN COMPARISON:  Head CT 01/15/2019 FINDINGS: CTA NECK FINDINGS  SKELETON: There is no bony spinal canal stenosis. No lytic or blastic lesion. OTHER NECK: Normal pharynx, larynx and major salivary glands. No cervical lymphadenopathy. Unremarkable thyroid gland. UPPER CHEST: No pneumothorax or pleural effusion. No nodules or masses. AORTIC ARCH: There is no calcific atherosclerosis of the aortic arch. There is no aneurysm, dissection or hemodynamically significant stenosis of the visualized ascending aorta and aortic arch. Conventional 3 vessel aortic branching pattern. There is atherosclerotic plaque within the proximal arch vessels. No high-grade stenosis. RIGHT CAROTID SYSTEM: --Common carotid artery: There is predominantly noncalcified plaque within the distal right common carotid artery causing approximately 60% stenosis. --Internal carotid artery: Occluded at its origin. There is return of opacification at the proximal petrous segment. --External carotid artery: No acute abnormality. LEFT CAROTID SYSTEM: --Common carotid artery: Widely patent origin without common carotid artery dissection or aneurysm. --Internal carotid artery: No dissection, occlusion or aneurysm. Mild atherosclerotic disease at the carotid bifurcation without hemodynamically significant stenosis. --External carotid artery: No acute abnormality. VERTEBRAL ARTERIES: Right dominant configuration. Both origins are normal. No dissection, occlusion or flow-limiting stenosis to the vertebrobasilar confluence. CTA HEAD FINDINGS POSTERIOR CIRCULATION: --Vertebral arteries: Normal right dominant configuration of V4 segments. --Posterior inferior cerebellar arteries (PICA): Patent origins from the vertebral arteries. --Anterior inferior cerebellar arteries (AICA): Patent origins from the basilar artery. --Basilar artery: Normal. --Superior cerebellar arteries: Normal. --Posterior cerebral arteries (PCA): Normal. Both originate from the basilar artery. Posterior communicating arteries (p-comm) are diminutive or  absent. ANTERIOR CIRCULATION: --Intracranial internal carotid arteries: There is diminished opacification of the right  ICA at the skull base. Atherosclerotic calcification of the left ICA. --Anterior cerebral arteries (ACA): Normal. Both A1 segments are present. Patent anterior communicating artery (a-comm). --Middle cerebral arteries (MCA): Normal. VENOUS SINUSES: As permitted by contrast timing, patent. ANATOMIC VARIANTS: None DELAYED PHASE: Not performed. Review of the MIP images confirms the above findings. IMPRESSION: 1. 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 2. The right MCA is patent, but there is diminished enhancement within the right MCA territory, possibly secondary to occlusion of small distal branches. 3. Critical Value/emergent results were called by telephone at the time of interpretation on 01/15/2019 at 8:54 pm to Dr. Arther DamesSUSHANTH AROOR , who verbally acknowledged these results. Electronically Signed   By: Deatra RobinsonKevin  Herman M.D.   On: 01/15/2019 20:55   Ct Head Wo Contrast  Result Date: 01/15/2019 CLINICAL DATA:  Left-sided weakness. EXAM: CT PERFUSION BRAIN CT HEAD WITHOUT CONTRAST TECHNIQUE: Multiphase CT imaging of the brain was performed following IV bolus contrast injection. Subsequent parametric perfusion maps were calculated using RAPID software. CONTRAST:  40mL ISOVUE-370 IOPAMIDOL (ISOVUE-370) INJECTION 76% COMPARISON:  None. FINDINGS: CT head findings: Brain: There is no mass, hemorrhage or extra-axial collection. The size and configuration of the ventricles and extra-axial CSF spaces are normal. There is poor gray-white differentiation of the posterior right insular cortex and within the anterior right temporal lobe. Vascular: No abnormal hyperdensity of the major intracranial arteries or dural venous sinuses. No intracranial atherosclerosis. Skull: The visualized skull base, calvarium and extracranial soft  tissues are normal. Sinuses/Orbits: No fluid levels or advanced mucosal thickening of the visualized paranasal sinuses. No mastoid or middle ear effusion. The orbits are normal. ASPECTS (Alberta Stroke Program Early CT Score) - Ganglionic level infarction (caudate, lentiform nuclei, internal capsule, insula, M1-M3 cortex): 5 - Supraganglionic infarction (M4-M6 cortex): 3 Total score (0-10 with 10 being normal): 8 CT Brain Perfusion Findings: CBF (<30%) Volume: 0mL Perfusion (Tmax>6.0s) volume: 104mL Mismatch Volume: 104mL Infarct Core: 0 mL Infarction Location:No demonstrable infarct by CT perfusion criteria. IMPRESSION: 1. Progressive loss of gray-white differentiation within the posterior right insula and anterior right temporal lobe. ASPECTS is 8. 2. No intracranial hemorrhage. 3. CT perfusion analysis demonstrating ischemic penumbra of 104 mL. Though there is no core infarct demonstrated by the perfusion scan, the early changes on the noncontrast portion of the study suggest that there most likely is a small core, though clearly smaller than the at risk region. These results were called by telephone at the time of interpretation on 01/15/2019 at 10:41 pm to Dr. Arther DamesSUSHANTH AROOR , who verbally acknowledged these results. Electronically Signed   By: Deatra RobinsonKevin  Herman M.D.   On: 01/15/2019 22:41   Ct Angio Neck W Or Wo Contrast  Result Date: 01/15/2019 CLINICAL DATA:  Stroke follow-up. EXAM: CT ANGIOGRAPHY HEAD AND NECK TECHNIQUE: Multidetector CT imaging of the head and neck was performed using the standard protocol during bolus administration of intravenous contrast. Multiplanar CT image reconstructions and MIPs were obtained to evaluate the vascular anatomy. Carotid stenosis measurements (when applicable) are obtained utilizing NASCET criteria, using the distal internal carotid diameter as the denominator. CONTRAST:  75mL OMNIPAQUE IOHEXOL 350 MG/ML SOLN COMPARISON:  Head CT 01/15/2019 FINDINGS: CTA NECK FINDINGS  SKELETON: There is no bony spinal canal stenosis. No lytic or blastic lesion. OTHER NECK: Normal pharynx, larynx and major salivary glands. No cervical lymphadenopathy. Unremarkable thyroid gland. UPPER CHEST: No pneumothorax or pleural effusion. No nodules  or masses. AORTIC ARCH: There is no calcific atherosclerosis of the aortic arch. There is no aneurysm, dissection or hemodynamically significant stenosis of the visualized ascending aorta and aortic arch. Conventional 3 vessel aortic branching pattern. There is atherosclerotic plaque within the proximal arch vessels. No high-grade stenosis. RIGHT CAROTID SYSTEM: --Common carotid artery: There is predominantly noncalcified plaque within the distal right common carotid artery causing approximately 60% stenosis. --Internal carotid artery: Occluded at its origin. There is return of opacification at the proximal petrous segment. --External carotid artery: No acute abnormality. LEFT CAROTID SYSTEM: --Common carotid artery: Widely patent origin without common carotid artery dissection or aneurysm. --Internal carotid artery: No dissection, occlusion or aneurysm. Mild atherosclerotic disease at the carotid bifurcation without hemodynamically significant stenosis. --External carotid artery: No acute abnormality. VERTEBRAL ARTERIES: Right dominant configuration. Both origins are normal. No dissection, occlusion or flow-limiting stenosis to the vertebrobasilar confluence. CTA HEAD FINDINGS POSTERIOR CIRCULATION: --Vertebral arteries: Normal right dominant configuration of V4 segments. --Posterior inferior cerebellar arteries (PICA): Patent origins from the vertebral arteries. --Anterior inferior cerebellar arteries (AICA): Patent origins from the basilar artery. --Basilar artery: Normal. --Superior cerebellar arteries: Normal. --Posterior cerebral arteries (PCA): Normal. Both originate from the basilar artery. Posterior communicating arteries (p-comm) are diminutive or  absent. ANTERIOR CIRCULATION: --Intracranial internal carotid arteries: There is diminished opacification of the right ICA at the skull base. Atherosclerotic calcification of the left ICA. --Anterior cerebral arteries (ACA): Normal. Both A1 segments are present. Patent anterior communicating artery (a-comm). --Middle cerebral arteries (MCA): Normal. VENOUS SINUSES: As permitted by contrast timing, patent. ANATOMIC VARIANTS: None DELAYED PHASE: Not performed. Review of the MIP images confirms the above findings. IMPRESSION: 1. 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 2. The right MCA is patent, but there is diminished enhancement within the right MCA territory, possibly secondary to occlusion of small distal branches. 3. Critical Value/emergent results were called by telephone at the time of interpretation on 01/15/2019 at 8:54 pm to Dr. Arther Dames , who verbally acknowledged these results. Electronically Signed   By: Deatra Robinson M.D.   On: 01/15/2019 20:55   Mr Brain Wo Contrast  Result Date: 01/16/2019 CLINICAL DATA:  Focal neuro deficit less than 6 hours. Revascularization of right carotid bifurcation occlusion. EXAM: MRI HEAD WITHOUT CONTRAST TECHNIQUE: Multiplanar, multiecho pulse sequences of the brain and surrounding structures were obtained without intravenous contrast. COMPARISON:  CT perfusion 01/15/2019. Cerebral arteriogram 01/15/2019. FINDINGS: Brain: Acute/subacute cortical infarcts are noted along the posterior right insular cortex and sylvian fissure. There is scattered involvement of the right frontal operculum and more sparsely over the superior convexity. There is relative sparing of the precentral gyrus. The overall territory is smaller than the predicted area of the CT perfusion. Petechial hemorrhage is evident within the infarct territory. No other significant cerebral hemorrhage is present. T2 signal  changes are associated with the area of subacute infarction. Mild atrophy and white matter changes are present otherwise. No significant cortical infarcts are present in the left hemisphere. The ventricles are of normal size. The internal auditory canals are within normal limits. The brainstem and cerebellum are within normal limits. Vascular: Flow is present in the major intracranial arteries. Skull and upper cervical spine: The craniocervical junction is normal. Upper cervical spine is within normal limits. Marrow signal is unremarkable. Sinuses/Orbits: The paranasal sinuses and mastoid air cells are clear. The globes and orbits are within normal limits. IMPRESSION: 1. Acute/subacute right MCA  territory cortical infarct involving insular cortex, some in fissure, and scattered areas involving the right frontal operculum and Morse partially over the superior convexity. 2. Overall infarct territory is smaller than predicted penumbra on the CT perfusion. 3. Mild atrophy and white matter disease otherwise. Electronically Signed   By: Marin Roberts M.D.   On: 01/16/2019 19:53   Ct Cerebral Perfusion W Contrast  Result Date: 01/15/2019 CLINICAL DATA:  Left-sided weakness. EXAM: CT PERFUSION BRAIN CT HEAD WITHOUT CONTRAST TECHNIQUE: Multiphase CT imaging of the brain was performed following IV bolus contrast injection. Subsequent parametric perfusion maps were calculated using RAPID software. CONTRAST:  70mL ISOVUE-370 IOPAMIDOL (ISOVUE-370) INJECTION 76% COMPARISON:  None. FINDINGS: CT head findings: Brain: There is no mass, hemorrhage or extra-axial collection. The size and configuration of the ventricles and extra-axial CSF spaces are normal. There is poor gray-white differentiation of the posterior right insular cortex and within the anterior right temporal lobe. Vascular: No abnormal hyperdensity of the major intracranial arteries or dural venous sinuses. No intracranial atherosclerosis. Skull: The  visualized skull base, calvarium and extracranial soft tissues are normal. Sinuses/Orbits: No fluid levels or advanced mucosal thickening of the visualized paranasal sinuses. No mastoid or middle ear effusion. The orbits are normal. ASPECTS (Alberta Stroke Program Early CT Score) - Ganglionic level infarction (caudate, lentiform nuclei, internal capsule, insula, M1-M3 cortex): 5 - Supraganglionic infarction (M4-M6 cortex): 3 Total score (0-10 with 10 being normal): 8 CT Brain Perfusion Findings: CBF (<30%) Volume: 45mL Perfusion (Tmax>6.0s) volume: Mismatch Volume: Infarct Core: 0 mL Infarction Location:No demonstrable infarct by CT perfusion criteria. IMPRESSION: 1. Progressive loss of gray-white differentiation within the posterior right insula and anterior right temporal lobe. ASPECTS is 8. 2. No intracranial hemorrhage. 3. CT perfusion analysis demonstrating ischemic penumbra of 104 mL. Though there is no core infarct demonstrated by the perfusion scan, the early changes on the noncontrast portion of the study suggest that there most likely is a small core, though clearly smaller than the at risk region. These results were called by telephone at the time of interpretation on 01/15/2019 at 10:41 pm to Dr. Arther Dames , who verbally acknowledged these results. Electronically Signed   By: Deatra Robinson M.D.   On: 01/15/2019 22:41   Dg Chest Port 1 View  Result Date: 01/16/2019 CLINICAL DATA:  Short of breath EXAM: PORTABLE CHEST 1 VIEW COMPARISON:  10/20/2018 FINDINGS: Normal heart size. Low lung volumes. Subsegmental atelectasis at the right lung base. No pneumothorax or pleural effusion. Stable interstitial prominence which is chronic. IMPRESSION: Subsegmental atelectasis at the right base. Electronically Signed   By: Jolaine Click M.D.   On: 01/16/2019 12:44   Ct Head Code Stroke Wo Contrast  Result Date: 01/15/2019 CLINICAL DATA:  Code stroke.  Left-sided weakness.  Stroke EXAM: CT HEAD  WITHOUT CONTRAST TECHNIQUE: Contiguous axial images were obtained from the base of the skull through the vertex without intravenous contrast. COMPARISON:  CT head 10/20/2018 FINDINGS: Brain: Mild atrophy. Negative for acute infarct, hemorrhage, or mass. Vascular: Hyperdense right MCA compatible with acute thrombosis. This was not seen previously. No other hyperdense vessel. Skull: Negative Sinuses/Orbits: Negative Other: None ASPECTS (Alberta Stroke Program Early CT Score) - Ganglionic level infarction (caudate, lentiform nuclei, internal capsule, insula, M1-M3 cortex): 7 - Supraganglionic infarction (M4-M6 cortex): 3 Total score (0-10 with 10 being normal): 10 IMPRESSION: 1. Hyperdense right MCA compatible with acute thrombosis. Negative for acute infarct or hemorrhage 2. ASPECTS is 10 3. These results were called  by telephone at the time of interpretation on 01/15/2019 at 8:26 pm to Dr. Laurence Slate, who verbally acknowledged these results. Electronically Signed   By: Marlan Palau M.D.   On: 01/15/2019 20:27    Labs:  CBC: Recent Labs    10/20/18 1627 01/15/19 2251 01/16/19 0542 01/17/19 0158  WBC 6.4 11.3* 12.0* 9.5  HGB 16.1 15.8 14.0 12.5*  HCT 49.3 46.0 41.8 37.6*  PLT 182 176 153 137*    COAGS: Recent Labs    01/15/19 2251  INR 1.1  APTT 26    BMP: Recent Labs    10/20/18 1627 01/15/19 2251 01/16/19 0542 01/17/19 0158  NA 138 139 142 139  K 4.1 3.8 3.8 3.5  CL 104 104 113* 111  CO2 23 21* 20* 20*  GLUCOSE 83 140* 124* 144*  BUN CALCIUM 8.8* 9.2 8.2* 8.0*  CREATININE 0.98 0.90 0.96 1.07  GFRNONAA >60 >60 >60 >60  GFRAA >60 >60 >60 >60    LIVER FUNCTION TESTS: Recent Labs    01/15/19 2251  BILITOT 0.5  AST 16  ALT 20  ALKPHOS 71  PROT 6.8  ALBUMIN 3.6    Assessment and Plan: 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 Corliss Skains. Proximal right ICA acute occlusion s/p stent  assisted angioplasty 01/16/2019 by Dr. Corliss Skains. Patient's condition improving- can spontaneously move all extremities, no gross residual neuro deficits. Temp 99.4; creat nl; WBC nl; hgb 12.5, plts 137k; f/u brain MRI yesterday:  1. 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 2. Overall infarct territory is smaller than predicted penumbra on the CT perfusion. 3. Mild atrophy and white matter disease otherwise  Right groin incision stable. Continue taking Brilinta 90 mg twice daily and Aspirin 81 mg once daily; P2Y12- 75 Plan to follow-up with Dr. Corliss Skains in clinic 4 weeks after discharge. Other plans as per neurology   Electronically Signed: D. Jeananne Rama, PA-C 01/17/2019, 10:17 AM   I spent a total of 15 minutes at the the patient's bedside AND on the patient's hospital floor or unit, greater than 50% of which was counseling/coordinating care for  right MCA inferior division and proximal right ICA occlusions s/p revascularization    Patient ID: Bryan Davies, male   DOB: 06/23/1951, 68 y.o.   MRN: [id number]

## 2019-01-17 NOTE — Progress Notes (Signed)
OT Cancellation Note  Patient Details Name: Bryan Davies MRN: [id number] DOB: 1951-02-05   Cancelled Treatment:    Reason Eval/Treat Not Completed: Patient at procedure or test/ unavailable, EEG in room.  Will follow to initiate eval as able.  Chancy Milroy, OT Acute Rehabilitation Services Pager [phone] Office [phone]   Chancy Milroy 01/17/2019, 1:04 PM

## 2019-01-17 NOTE — Plan of Care (Signed)
Discussed smoking risk and patient verbalized wanting to quit and having his fiance join him in quitting as well. Patient OOB and ambulated to bathroom with 1 assist well. Nutrition back to baseline, he is eating full meals and drinks large amounts.   Only barrier slow to progress is with voiding. Patient voiding in small amounts earlier in the shift. Then later unable to void, bladder scan showed 477, in and out cath performed. Will monitor closely

## 2019-01-17 NOTE — Progress Notes (Signed)
PHARMACIST - PHYSICIAN COMMUNICATION  DR:   Pearlean Brownie  CONCERNING: IV to Oral Route Change Policy  RECOMMENDATION: This patient is receiving Protonix by the intravenous route.  Based on criteria approved by the Pharmacy and Therapeutics Committee, the intravenous medication(s) is/are being converted to the equivalent oral dose form(s).   DESCRIPTION: These criteria include:  The patient is eating (either orally or via tube) and/or has been taking other orally administered medications for a least 24 hours  The patient has no evidence of active gastrointestinal bleeding or impaired GI absorption (gastrectomy, short bowel, patient on TNA or NPO).  If you have questions about this conversion, please contact the Pharmacy Department  []   [phone] )  [facility] []   [phone] )  [facility] [x]   [phone] )  [name] []   [phone] )  [facility] []   [phone] )  [facility]   [place], [place] 01/17/2019 1:37 PM

## 2019-01-17 NOTE — Progress Notes (Signed)
EEG completed, results pending. 

## 2019-01-17 NOTE — Procedures (Signed)
History: 68 year old male being evaluated for left-sided weakness  Sedation: None  Technique: This is a 21 channel routine scalp EEG performed at the bedside with bipolar and monopolar montages arranged in accordance to the international 10/20 system of electrode placement. One channel was dedicated to EKG recording.    Background: The background consists of intermixed alpha and beta activities. There is a well defined posterior dominant rhythm of 8-9 Hz that attenuates with eye opening. Sleep is recorded with normal appearing structures.   Photic stimulation: Physiologic driving is not performed  EEG Abnormalities: None  Clinical Interpretation: This normal EEG is recorded in the waking and sleep state. There was no seizure or seizure predisposition recorded on this study. Please note that lack of epileptiform activity on EEG does not preclude the possibility of epilepsy.   Ritta Slot, MD [facility] [phone]  If 7pm- 7am, please page neurology on call as listed in AMION.

## 2019-01-17 NOTE — Progress Notes (Signed)
STROKE TEAM PROGRESS NOTE   INTERVAL HISTORY Bryan Davies is at the bedside.  He became agitated as he still has urinary retention.  Removed Foley catheter, he was able to use urinal to urinate.  Still has mild left pronator drift and hand dexterity.  Vitals:   01/17/19 0100 01/17/19 0200 01/17/19 0253 01/17/19 0530  BP: (!) 115/56 123/71 102/63 (!) 108/56  Pulse: 74 88 71 75  Resp: (!) 25 20 20 18   Temp:   98.6 F (37 C) 98.1 F (36.7 C)  TempSrc:   Oral Oral  SpO2: 94% 92% 96%   Weight:      Height:        CBC:  Recent Labs  Lab 01/15/19 2251 01/16/19 0542 01/17/19 0158  WBC 11.3* 12.0* 9.5  NEUTROABS 9.7* 9.9*  --   HGB 15.8 14.0 12.5*  HCT 46.0 41.8 37.6*  MCV 89.0 89.9 89.3  PLT 176 153 137*    Basic Metabolic Panel:  Recent Labs  Lab 01/16/19 0542 01/17/19 0158  NA 142 139  K 3.8 3.5  CL 113* 111  CO2 20* 20*  GLUCOSE 124* 144*  BUN 22 16  CREATININE 0.96 1.07  CALCIUM 8.2* 8.0*   Lipid Panel:     Component Value Date/Time   CHOL 152 01/16/2019 0542   TRIG 107 01/16/2019 0542   HDL 35 (L) 01/16/2019 0542   CHOLHDL 4.3 01/16/2019 0542   VLDL 21 01/16/2019 0542   LDLCALC 96 01/16/2019 0542   HgbA1c:  Lab Results  Component Value Date   HGBA1C 6.6 (H) 01/16/2019   Urine Drug Screen:     Component Value Date/Time   LABOPIA NONE DETECTED 01/15/2019 2003   COCAINSCRNUR NONE DETECTED 01/15/2019 2003   LABBENZ NONE DETECTED 01/15/2019 2003   AMPHETMU NONE DETECTED 01/15/2019 2003   THCU NONE DETECTED 01/15/2019 2003   LABBARB NONE DETECTED 01/15/2019 2003    Alcohol Level     Component Value Date/Time   ETH <10 01/15/2019 2251    IMAGING Ct Angio Head W Or Wo Contrast  Result Date: 01/15/2019 CLINICAL DATA:  Stroke follow-up. EXAM: CT ANGIOGRAPHY HEAD AND NECK TECHNIQUE: Multidetector CT imaging of the head and neck was performed using the standard protocol during bolus administration of intravenous contrast. Multiplanar CT image  reconstructions and MIPs were obtained to evaluate the vascular anatomy. Carotid stenosis measurements (when applicable) are obtained utilizing NASCET criteria, using the distal internal carotid diameter as the denominator. CONTRAST:  34mL OMNIPAQUE IOHEXOL 350 MG/ML SOLN COMPARISON:  Head CT 01/15/2019 FINDINGS: CTA NECK FINDINGS SKELETON: There is no bony spinal canal stenosis. No lytic or blastic lesion. OTHER NECK: Normal pharynx, larynx and major salivary glands. No cervical lymphadenopathy. Unremarkable thyroid gland. UPPER CHEST: No pneumothorax or pleural effusion. No nodules or masses. AORTIC ARCH: There is no calcific atherosclerosis of the aortic arch. There is no aneurysm, dissection or hemodynamically significant stenosis of the visualized ascending aorta and aortic arch. Conventional 3 vessel aortic branching pattern. There is atherosclerotic plaque within the proximal arch vessels. No high-grade stenosis. RIGHT CAROTID SYSTEM: --Common carotid artery: There is predominantly noncalcified plaque within the distal right common carotid artery causing approximately 60% stenosis. --Internal carotid artery: Occluded at its origin. There is return of opacification at the proximal petrous segment. --External carotid artery: No acute abnormality. LEFT CAROTID SYSTEM: --Common carotid artery: Widely patent origin without common carotid artery dissection or aneurysm. --Internal carotid artery: No dissection, occlusion or aneurysm. Mild atherosclerotic disease at the  carotid bifurcation without hemodynamically significant stenosis. --External carotid artery: No acute abnormality. VERTEBRAL ARTERIES: Right dominant configuration. Both origins are normal. No dissection, occlusion or flow-limiting stenosis to the vertebrobasilar confluence. CTA HEAD FINDINGS POSTERIOR CIRCULATION: --Vertebral arteries: Normal right dominant configuration of V4 segments. --Posterior inferior cerebellar arteries (PICA): Patent origins  from the vertebral arteries. --Anterior inferior cerebellar arteries (AICA): Patent origins from the basilar artery. --Basilar artery: Normal. --Superior cerebellar arteries: Normal. --Posterior cerebral arteries (PCA): Normal. Both originate from the basilar artery. Posterior communicating arteries (p-comm) are diminutive or absent. ANTERIOR CIRCULATION: --Intracranial internal carotid arteries: There is diminished opacification of the right ICA at the skull base. Atherosclerotic calcification of the left ICA. --Anterior cerebral arteries (ACA): Normal. Both A1 segments are present. Patent anterior communicating artery (a-comm). --Middle cerebral arteries (MCA): Normal. VENOUS SINUSES: As permitted by contrast timing, patent. ANATOMIC VARIANTS: None DELAYED PHASE: Not performed. Review of the MIP images confirms the above findings. IMPRESSION: 1. 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 2. The right MCA is patent, but there is diminished enhancement within the right MCA territory, possibly secondary to occlusion of small distal branches. 3. Critical Value/emergent results were called by telephone at the time of interpretation on 01/15/2019 at 8:54 pm to Dr. Arther DamesSUSHANTH AROOR , who verbally acknowledged these results. Electronically Signed   By: Deatra RobinsonKevin  Herman M.D.   On: 01/15/2019 20:55   Ct Head Wo Contrast  Result Date: 01/15/2019 CLINICAL DATA:  Left-sided weakness. EXAM: CT PERFUSION BRAIN CT HEAD WITHOUT CONTRAST TECHNIQUE: Multiphase CT imaging of the brain was performed following IV bolus contrast injection. Subsequent parametric perfusion maps were calculated using RAPID software. CONTRAST:  40mL ISOVUE-370 IOPAMIDOL (ISOVUE-370) INJECTION 76% COMPARISON:  None. FINDINGS: CT head findings: Brain: There is no mass, hemorrhage or extra-axial collection. The size and configuration of the ventricles and extra-axial CSF spaces are  normal. There is poor gray-white differentiation of the posterior right insular cortex and within the anterior right temporal lobe. Vascular: No abnormal hyperdensity of the major intracranial arteries or dural venous sinuses. No intracranial atherosclerosis. Skull: The visualized skull base, calvarium and extracranial soft tissues are normal. Sinuses/Orbits: No fluid levels or advanced mucosal thickening of the visualized paranasal sinuses. No mastoid or middle ear effusion. The orbits are normal. ASPECTS (Alberta Stroke Program Early CT Score) - Ganglionic level infarction (caudate, lentiform nuclei, internal capsule, insula, M1-M3 cortex): 5 - Supraganglionic infarction (M4-M6 cortex): 3 Total score (0-10 with 10 being normal): 8 CT Brain Perfusion Findings: CBF (<30%) Volume: 0mL Perfusion (Tmax>6.0s) volume: 104mL Mismatch Volume: 104mL Infarct Core: 0 mL Infarction Location:No demonstrable infarct by CT perfusion criteria. IMPRESSION: 1. Progressive loss of gray-white differentiation within the posterior right insula and anterior right temporal lobe. ASPECTS is 8. 2. No intracranial hemorrhage. 3. CT perfusion analysis demonstrating ischemic penumbra of 104 mL. Though there is no core infarct demonstrated by the perfusion scan, the early changes on the noncontrast portion of the study suggest that there most likely is a small core, though clearly smaller than the at risk region. These results were called by telephone at the time of interpretation on 01/15/2019 at 10:41 pm to Dr. Arther DamesSUSHANTH AROOR , who verbally acknowledged these results. Electronically Signed   By: Deatra RobinsonKevin  Herman M.D.   On: 01/15/2019 22:41   Ct Angio Neck W Or Wo Contrast  Result Date: 01/15/2019 CLINICAL DATA:  Stroke follow-up. EXAM: CT ANGIOGRAPHY HEAD AND NECK TECHNIQUE:  Multidetector CT imaging of the head and neck was performed using the standard protocol during bolus administration of intravenous contrast. Multiplanar CT image  reconstructions and MIPs were obtained to evaluate the vascular anatomy. Carotid stenosis measurements (when applicable) are obtained utilizing NASCET criteria, using the distal internal carotid diameter as the denominator. CONTRAST:  75mL OMNIPAQUE IOHEXOL 350 MG/ML SOLN COMPARISON:  Head CT 01/15/2019 FINDINGS: CTA NECK FINDINGS SKELETON: There is no bony spinal canal stenosis. No lytic or blastic lesion. OTHER NECK: Normal pharynx, larynx and major salivary glands. No cervical lymphadenopathy. Unremarkable thyroid gland. UPPER CHEST: No pneumothorax or pleural effusion. No nodules or masses. AORTIC ARCH: There is no calcific atherosclerosis of the aortic arch. There is no aneurysm, dissection or hemodynamically significant stenosis of the visualized ascending aorta and aortic arch. Conventional 3 vessel aortic branching pattern. There is atherosclerotic plaque within the proximal arch vessels. No high-grade stenosis. RIGHT CAROTID SYSTEM: --Common carotid artery: There is predominantly noncalcified plaque within the distal right common carotid artery causing approximately 60% stenosis. --Internal carotid artery: Occluded at its origin. There is return of opacification at the proximal petrous segment. --External carotid artery: No acute abnormality. LEFT CAROTID SYSTEM: --Common carotid artery: Widely patent origin without common carotid artery dissection or aneurysm. --Internal carotid artery: No dissection, occlusion or aneurysm. Mild atherosclerotic disease at the carotid bifurcation without hemodynamically significant stenosis. --External carotid artery: No acute abnormality. VERTEBRAL ARTERIES: Right dominant configuration. Both origins are normal. No dissection, occlusion or flow-limiting stenosis to the vertebrobasilar confluence. CTA HEAD FINDINGS POSTERIOR CIRCULATION: --Vertebral arteries: Normal right dominant configuration of V4 segments. --Posterior inferior cerebellar arteries (PICA): Patent origins  from the vertebral arteries. --Anterior inferior cerebellar arteries (AICA): Patent origins from the basilar artery. --Basilar artery: Normal. --Superior cerebellar arteries: Normal. --Posterior cerebral arteries (PCA): Normal. Both originate from the basilar artery. Posterior communicating arteries (p-comm) are diminutive or absent. ANTERIOR CIRCULATION: --Intracranial internal carotid arteries: There is diminished opacification of the right ICA at the skull base. Atherosclerotic calcification of the left ICA. --Anterior cerebral arteries (ACA): Normal. Both A1 segments are present. Patent anterior communicating artery (a-comm). --Middle cerebral arteries (MCA): Normal. VENOUS SINUSES: As permitted by contrast timing, patent. ANATOMIC VARIANTS: None DELAYED PHASE: Not performed. Review of the MIP images confirms the above findings. IMPRESSION: 1. 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 2. The right MCA is patent, but there is diminished enhancement within the right MCA territory, possibly secondary to occlusion of small distal branches. 3. Critical Value/emergent results were called by telephone at the time of interpretation on 01/15/2019 at 8:54 pm to Dr. Arther Dames , who verbally acknowledged these results. Electronically Signed   By: Deatra Robinson M.D.   On: 01/15/2019 20:55   Mr Brain Wo Contrast  Result Date: 01/16/2019 CLINICAL DATA:  Focal neuro deficit less than 6 hours. Revascularization of right carotid bifurcation occlusion. EXAM: MRI HEAD WITHOUT CONTRAST TECHNIQUE: Multiplanar, multiecho pulse sequences of the brain and surrounding structures were obtained without intravenous contrast. COMPARISON:  CT perfusion 01/15/2019. Cerebral arteriogram 01/15/2019. FINDINGS: Brain: Acute/subacute cortical infarcts are noted along the posterior right insular cortex and sylvian fissure. There is scattered involvement of the  right frontal operculum and more sparsely over the superior convexity. There is relative sparing of the precentral gyrus. The overall territory is smaller than the predicted area of the CT perfusion. Petechial hemorrhage is evident within the infarct territory. No other significant cerebral hemorrhage  is present. T2 signal changes are associated with the area of subacute infarction. Mild atrophy and white matter changes are present otherwise. No significant cortical infarcts are present in the left hemisphere. The ventricles are of normal size. The internal auditory canals are within normal limits. The brainstem and cerebellum are within normal limits. Vascular: Flow is present in the major intracranial arteries. Skull and upper cervical spine: The craniocervical junction is normal. Upper cervical spine is within normal limits. Marrow signal is unremarkable. Sinuses/Orbits: The paranasal sinuses and mastoid air cells are clear. The globes and orbits are within normal limits. IMPRESSION: 1. 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 2. Overall infarct territory is smaller than predicted penumbra on the CT perfusion. 3. Mild atrophy and white matter disease otherwise. Electronically Signed   By: Marin Roberts M.D.   On: 01/16/2019 19:53   Ct Cerebral Perfusion W Contrast  Result Date: 01/15/2019 CLINICAL DATA:  Left-sided weakness. EXAM: CT PERFUSION BRAIN CT HEAD WITHOUT CONTRAST TECHNIQUE: Multiphase CT imaging of the brain was performed following IV bolus contrast injection. Subsequent parametric perfusion maps were calculated using RAPID software. CONTRAST:  40mL ISOVUE-370 IOPAMIDOL (ISOVUE-370) INJECTION 76% COMPARISON:  None. FINDINGS: CT head findings: Brain: There is no mass, hemorrhage or extra-axial collection. The size and configuration of  the ventricles and extra-axial CSF spaces are normal. There is poor gray-white differentiation of the posterior right insular cortex and within the anterior right temporal lobe. Vascular: No abnormal hyperdensity of the major intracranial arteries or dural venous sinuses. No intracranial atherosclerosis. Skull: The visualized skull base, calvarium and extracranial soft tissues are normal. Sinuses/Orbits: No fluid levels or advanced mucosal thickening of the visualized paranasal sinuses. No mastoid or middle ear effusion. The orbits are normal. ASPECTS (Alberta Stroke Program Early CT Score) - Ganglionic level infarction (caudate, lentiform nuclei, internal capsule, insula, M1-M3 cortex): 5 - Supraganglionic infarction (M4-M6 cortex): 3 Total score (0-10 with 10 being normal): 8 CT Brain Perfusion Findings: CBF (<30%) Volume: 0mL Perfusion (Tmax>6.0s) volume: Mismatch Volume: Infarct Core: 0 mL Infarction Location:No demonstrable infarct by CT perfusion criteria. IMPRESSION: 1. Progressive loss of gray-white differentiation within the posterior right insula and anterior right temporal lobe. ASPECTS is 8. 2. No intracranial hemorrhage. 3. CT perfusion analysis demonstrating ischemic penumbra of 104 mL. Though there is no core infarct demonstrated by the perfusion scan, the early changes on the noncontrast portion of the study suggest that there most likely is a small core, though clearly smaller than the at risk region. These results were called by telephone at the time of interpretation on 01/15/2019 at 10:41 pm to Dr. Arther Dames , who verbally acknowledged these results. Electronically Signed   By: Deatra Robinson M.D.   On: 01/15/2019 22:41   Dg Chest Port 1 View  Result Date: 01/16/2019 CLINICAL DATA:  Short of breath EXAM: PORTABLE CHEST 1 VIEW COMPARISON:  10/20/2018 FINDINGS: Normal heart size. Low lung volumes. Subsegmental atelectasis at the right lung base. No pneumothorax or pleural  effusion. Stable interstitial prominence which is chronic. IMPRESSION: Subsegmental atelectasis at the right base. Electronically Signed   By: Jolaine Click M.D.   On: 01/16/2019 12:44   Ct Head Code Stroke Wo Contrast  Result Date: 01/15/2019 CLINICAL DATA:  Code stroke.  Left-sided weakness.  Stroke EXAM: CT HEAD WITHOUT CONTRAST TECHNIQUE: Contiguous axial images were obtained from the  base of the skull through the vertex without intravenous contrast. COMPARISON:  CT head 10/20/2018 FINDINGS: Brain: Mild atrophy. Negative for acute infarct, hemorrhage, or mass. Vascular: Hyperdense right MCA compatible with acute thrombosis. This was not seen previously. No other hyperdense vessel. Skull: Negative Sinuses/Orbits: Negative Other: None ASPECTS (Alberta Stroke Program Early CT Score) - Ganglionic level infarction (caudate, lentiform nuclei, internal capsule, insula, M1-M3 cortex): 7 - Supraganglionic infarction (M4-M6 cortex): 3 Total score (0-10 with 10 being normal): 10 IMPRESSION: 1. Hyperdense right MCA compatible with acute thrombosis. Negative for acute infarct or hemorrhage 2. ASPECTS is 10 3. These results were called by telephone at the time of interpretation on 01/15/2019 at 8:26 pm to Dr. Laurence Slate, who verbally acknowledged these results. Electronically Signed   By: Marlan Palau M.D.   On: 01/15/2019 20:27   Cerebral angio S/P RT common carotid arteriogram followed by completye revascularization of Rt MCA inf division with 3mg  of IA inteegrelin with TICI 2 B revascularization and Revascularization of symptomatic acute occlusion of prox RT ICA with stent assisted angioplasty with prox flow arrest.  PHYSICAL EXAM Middle-aged Caucasian male not in distress. . Afebrile. Head is nontraumatic. Neck is supple without bruit.    Cardiac exam no murmur or gallop. Lungs are clear to auscultation. Distal pulses are well felt. Neurological Exam ;  Awake  Alert oriented x 3. Normal speech and language.eye  movements full without nystagmus.fundi were not visualized. Vision acuity and fields appear normal. Hearing is normal. Palatal movements are normal. Face symmetric. Tongue midline. Normal strength, tone, reflexes and coordination except left pronator drift and left hand dexterity difficulty. Normal sensation. Gait deferred.   ASSESSMENT/PLAN Mr. Bryan Davies is a 68 y.o. male with history of HTN, COPD, HLD, tobacco abuse, bipolar disorder presenting after having passed out after stiffening. CPR. Found to have facial droop, LUE drift in ED. CTA showed R ICA occlusion. Sent to IR with RI ICA stent placement and TICI 2b revascularization of R MCA inferior branch.    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 ASPECTS 10.     CTA head & neck occlusion R ICA. Diminished R MCA flow.   CT head repeat progressive loss gray-white differentiation posterior R insula and anterior R temporal lobe. APECTS 8. No ICH.  CT perfusion penumbra . No core infarct  Cerebral angio R MCA inf division revascularization TICI 2b w/ Integrilin, R ICA stent assisted angioplasty w/ prox flow arrest  Post IR CT no ICH, mass effect or shift  MRI right MCA moderate infarct  2D Echo w/ bubble EF more than 65%  EEG normal, no seizure  LDL 96  HgbA1c 6.6  SCDs for VTE prophylaxis  No antithrombotic prior to admission, now on aspirin 81 mg daily and Brilinta (ticagrelor) 90 mg bid. continue at d/c   Therapy recommendations: No PT follow-up  Disposition:  pending   Right ICA occlusion  Status post stent  Likely due to atherosclerosis given risk factors  On aspirin and Brilinta, continue on discharge  Follow-up with Dr. Corliss Skains as outpatient  Tobacco abuse  Current smoker  Smoking cessation counseling provided  Nicotine patch  provided  Pt is willing to quit  Hypertension  Home meds: norvasc  Stable . Resumed home meds . Long-term BP goal normotensive  Hyperlipidemia  Home meds:  None  LDL 96, goal < 70  On Lipitor 40  Continue statin at discharge  Other Stroke Risk Factors  Advanced age  Other Active Problems  Leukocytosis 12.0-9.5. UA neg. Check CXR.  BPH w/ Urinary retention on flomax PTA. Difficulty voiding. To check residuals, place foley if appropriate.  Bipolar disorder  COPD  Hospital day # 2  Marvel Plan, MD PhD Stroke Neurology 01/17/2019 5:39 PM   To contact Stroke Continuity provider, please refer to [email or web address]. After hours, contact General Neurology

## 2019-01-17 NOTE — Plan of Care (Signed)
  Problem: Ischemic Stroke/TIA Tissue Perfusion: Goal: Complications of ischemic stroke/TIA will be minimized Outcome: Progressing   Problem: Clinical Measurements: Goal: Ability to maintain clinical measurements within normal limits will improve Outcome: Progressing

## 2019-01-18 DIAGNOSIS — I63231 Cerebral infarction due to unspecified occlusion or stenosis of right carotid arteries: Secondary | ICD-10-CM

## 2019-01-18 DIAGNOSIS — E876 Hypokalemia: Secondary | ICD-10-CM

## 2019-01-18 LAB — BASIC METABOLIC PANEL
Anion gap: 7 (ref 5–15)
BUN: 12 mg/dL (ref 8–23)
CO2: 23 mmol/L (ref 22–32)
CREATININE: 0.86 mg/dL (ref 0.61–1.24)
Calcium: 8.1 mg/dL — ABNORMAL LOW (ref 8.9–10.3)
Chloride: 107 mmol/L (ref 98–111)
GFR calc Af Amer: >60 mL/min (ref >60)
GFR calc non Af Amer: >60 mL/min (ref >60)
Glucose, Bld: 107 mg/dL — ABNORMAL HIGH (ref 70–99)
Potassium: 3.3 mmol/L — ABNORMAL LOW (ref 3.5–5.1)
Sodium: 137 mmol/L (ref 135–145)

## 2019-01-18 LAB — CBC
HCT: 37.9 % — ABNORMAL LOW (ref 39.0–52.0)
Hemoglobin: 12.5 g/dL — ABNORMAL LOW (ref 13.0–17.0)
MCH: 29.1 pg (ref 26.0–34.0)
MCHC: 33 g/dL (ref 30.0–36.0)
MCV: 88.3 fL (ref 80.0–100.0)
PLATELETS: 132 10*3/uL — AB (ref 150–400)
RBC: 4.29 MIL/uL (ref 4.22–5.81)
RDW: 12.4 % (ref 11.5–15.5)
WBC: 9.3 10*3/uL (ref 4.0–10.5)
nRBC: 0 % (ref 0.0–0.2)

## 2019-01-18 MED ORDER — POTASSIUM CHLORIDE CRYS ER 20 MEQ PO TBCR
40.0000 meq | EXTENDED_RELEASE_TABLET | Freq: Once | ORAL | Status: AC
  Administered 2019-01-18: 40 meq via ORAL
  Filled 2019-01-18: qty 2

## 2019-01-18 MED ORDER — GABAPENTIN 300 MG PO CAPS: 300.0000 mg | ORAL_CAPSULE | Freq: Two times a day (BID) | ORAL | 11 refills | Status: DC

## 2019-01-18 MED ORDER — NICOTINE 14 MG/24HR TD PT24: 14.0000 mg | MEDICATED_PATCH | Freq: Every day | TRANSDERMAL | 0 refills | Status: DC

## 2019-01-18 MED ORDER — ASPIRIN 81 MG PO CHEW: 81.0000 mg | CHEWABLE_TABLET | Freq: Every day | ORAL | Status: DC

## 2019-01-18 MED ORDER — ATORVASTATIN CALCIUM 40 MG PO TABS: 40.0000 mg | ORAL_TABLET | Freq: Every day | ORAL | 11 refills | Status: AC

## 2019-01-18 MED ORDER — TICAGRELOR 90 MG PO TABS: 90.0000 mg | ORAL_TABLET | Freq: Two times a day (BID) | ORAL | 11 refills | Status: DC

## 2019-01-18 MED ORDER — TICAGRELOR 90 MG PO TABS: 90.0000 mg | ORAL_TABLET | Freq: Two times a day (BID) | ORAL | 3 refills | Status: DC

## 2019-01-18 MED ORDER — CYCLOBENZAPRINE HCL 10 MG PO TABS: 10.0000 mg | ORAL_TABLET | Freq: Two times a day (BID) | ORAL | 0 refills | Status: DC | PRN

## 2019-01-18 NOTE — Progress Notes (Signed)
Occupational Therapy Treatment Patient Details Name: Bryan Davies MRN: [id number] DOB: [birth year] Today's Date: 01/18/2019    History of present illness 68 y.o. male with history of HTN, COPD, HLD, tobacco abuse, bipolar disorder presenting after having passed out after stiffening. CPR. Found to have facial droop, LUE drift in ED. CTA showed R ICA occlusion. Sent to IR with RI ICA stent placement and TICI 2b revascularization of R MCA inferior branch.     OT comments  Pt standing in room upon entry, dressed and ready for dc home.  Agreeable to engage in functional cognition assessment using pill box test.  Pt failed the assessment, demonstrating poor planning, mental flexibility, suboptimal search strategies, concrete thinking and the inability to multitask.  Patients significant other cued patient throughout test (although therapist requested pt complete without her assist), even with cueing patient unable to follow directions provided to correctly place pills into box.  Based on performance, recommend patient have direct supervision for ALL medication management at this time. In addition, recommend direct supervision for IADLs and NO driving.  Pt and significant other agreed with recommendations. DC plan remains appropriate. Will follow.    Follow Up Recommendations  Supervision/Assistance - 24 hour;Outpatient OT    Equipment Recommendations  3 in 1 bedside commode    Recommendations for Other Services      Precautions / Restrictions Precautions Precautions: Fall Restrictions Weight Bearing Restrictions: No       Mobility Bed Mobility               General bed mobility comments: EOB upon entry   Transfers Overall transfer level: Needs assistance Equipment used: None Transfers: Sit to/from Stand Sit to Stand: Independent         General transfer comment: no assist, standing in room upon entry    Balance Overall balance assessment: Needs assistance Sitting-balance  support: Feet unsupported;No upper extremity supported Sitting balance-Leahy Scale: Normal                                     ADL either performed or assessed with clinical judgement   ADL                                         General ADL Comments: continue to recommend supervision for all self care and IADL tasks, will have support of significant other      Vision   Additional Comments: able to read "pill" labels with supervision, increased time required   Perception     Praxis      Cognition Arousal/Alertness: Awake/alert Behavior During Therapy: Impulsive;WFL for tasks assessed/performed Overall Cognitive Status: Impaired/Different from baseline Area of Impairment: Attention;Memory;Safety/judgement;Awareness;Problem solving;Following commands                   Current Attention Level: Sustained Memory: Decreased short-term memory Following Commands: Follows multi-step commands inconsistently Safety/Judgement: Decreased awareness of deficits;Decreased awareness of safety Awareness: Emergent Problem Solving: Slow processing;Difficulty sequencing;Requires verbal cues General Comments: Pill box test performed, see general comments.         Exercises     Shoulder Instructions       General Comments   General Comments: Assessed using the Pill Box Test. Pt failed the assessment, demonstrating poor planning, mental flexibility, suboptimal search strategies, concrete thinking and the  inability to multitask. More than 3 errors is considered a fail. Patient able to read each label with increased time, initiated task by only placing medications in 1 day (rather than for the entire week as instructed).  Patient's significant other provided maximal verbal cues, even after therapist re-directed and requested patient to have ability to attempt independently.  Therefore, therapist ended test early and did not directly count # of errors; based on  performance, "failed" test.      Pertinent Vitals/ Pain       Pain Assessment: No/denies pain  Home Living                                          Prior Functioning/Environment              Frequency  Min 2X/week        Progress Toward Goals  OT Goals(current goals can now be found in the care plan section)  Progress towards OT goals: Progressing toward goals  Acute Rehab OT Goals Patient Stated Goal: home OT Goal Formulation: With patient Time For Goal Achievement: 01/31/19 Potential to Achieve Goals: Good  Plan Discharge plan remains appropriate;Frequency remains appropriate    Co-evaluation                 AM-PAC OT "6 Clicks" Daily Activity     Outcome Measure   Help from another person eating meals?: None Help from another person taking care of personal grooming?: None Help from another person toileting, which includes using toliet, bedpan, or urinal?: None Help from another person bathing (including washing, rinsing, drying)?: None Help from another person to put on and taking off regular upper body clothing?: None Help from another person to put on and taking off regular lower body clothing?: None 6 Click Score: 24    End of Session    OT Visit Diagnosis: Other abnormalities of gait and mobility (R26.89);Muscle weakness (generalized) (M62.81);Other symptoms and signs involving cognitive function   Activity Tolerance Patient tolerated treatment well   Patient Left with call bell/phone within reach;Other (comment);with family/visitor present(seated EOB )   Nurse Communication Mobility status;Other (comment)(recommendations )        Time: 0539-7673 OT Time Calculation (min): 23 min  Charges: OT General Charges $OT Visit: 1 Visit OT Treatments $Cognitive Funtion inital: Initial 15 mins $Cognitive Funtion additional: Additional15 mins  Chancy Milroy, OT Acute Rehabilitation Services Pager [phone] Office  [phone]    Chancy Milroy 01/18/2019, 1:48 PM

## 2019-01-18 NOTE — Progress Notes (Signed)
Physical Therapy Treatment Patient Details Name: Bryan Davies MRN: [id number] DOB: 1951-08-15 Today's Date: 01/18/2019    History of Present Illness 68 y.o. male with history of HTN, COPD, HLD, tobacco abuse, bipolar disorder presenting after having passed out after stiffening. CPR. Found to have facial droop, LUE drift in ED. CTA showed R ICA occlusion. Sent to IR with RI ICA stent placement and TICI 2b revascularization of R MCA inferior branch.      PT Comments    Patient seen for mobility progression. Tolerated session well with extended ambulation and stair negotation. Anticipate patient will be safe for d/c home with GF to assist. Current POC remains appropriate. I have discussed the patient's current level of function related to mobility with the patient and girlfriend.  They acknowledge understanding of this and feel they can provide the level of care the patient will need at home.      Follow Up Recommendations  Outpatient PT;Supervision - Intermittent     Equipment Recommendations  Cane    Recommendations for Other Services       Precautions / Restrictions Precautions Precautions: Fall Restrictions Weight Bearing Restrictions: No    Mobility  Bed Mobility Overal bed mobility: Independent                Transfers Overall transfer level: Needs assistance Equipment used: None Transfers: Sit to/from Stand Sit to Stand: Independent         General transfer comment: no physical assist of cues  Ambulation/Gait Ambulation/Gait assistance: Supervision Gait Distance (Feet): 480 Feet Assistive device: None     Gait velocity interpretation: >2.62 ft/sec, indicative of community ambulatory General Gait Details: mild instbaility but no physical assist required   Stairs Stairs: Yes Stairs assistance: Supervision Stair Management: Two rails Number of Stairs: 12 General stair comments: no physical assist, supervision for safety   Wheelchair Mobility     Modified Rankin (Stroke Patients Only)       Balance Overall balance assessment: Needs assistance Sitting-balance support: Feet unsupported;No upper extremity supported Sitting balance-Leahy Scale: Normal     Standing balance support: No upper extremity supported;During functional activity Standing balance-Leahy Scale: Good                              Cognition Arousal/Alertness: Awake/alert Behavior During Therapy: Impulsive;WFL for tasks assessed/performed Overall Cognitive Status: Impaired/Different from baseline Area of Impairment: Attention;Memory;Safety/judgement;Awareness;Problem solving                   Current Attention Level: Sustained Memory: Decreased short-term memory   Safety/Judgement: Decreased awareness of deficits;Decreased awareness of safety Awareness: Emergent;Anticipatory Problem Solving: Slow processing;Difficulty sequencing;Requires verbal cues        Exercises      General Comments        Pertinent Vitals/Pain Pain Assessment: No/denies pain    Home Living                      Prior Function            PT Goals (current goals can now be found in the care plan section) Acute Rehab PT Goals Patient Stated Goal: home PT Goal Formulation: With patient Time For Goal Achievement: 01/31/19 Potential to Achieve Goals: Good Progress towards PT goals: Progressing toward goals    Frequency    Min 4X/week      PT Plan Current plan remains appropriate    Co-evaluation  AM-PAC PT "6 Clicks" Mobility   Outcome Measure  Help needed turning from your back to your side while in a flat bed without using bedrails?: None Help needed moving from lying on your back to sitting on the side of a flat bed without using bedrails?: None Help needed moving to and from a bed to a chair (including a wheelchair)?: None Help needed standing up from a chair using your arms (e.g., wheelchair or bedside  chair)?: None Help needed to walk in hospital room?: A Little Help needed climbing 3-5 steps with a railing? : A Little 6 Click Score: 22    End of Session Equipment Utilized During Treatment: Gait belt Activity Tolerance: Patient tolerated treatment well Patient left: in bed;with call bell/phone within reach;with bed alarm set;with family/visitor present Nurse Communication: Mobility status PT Visit Diagnosis: Difficulty in walking, not elsewhere classified (R26.2)     Time: 6606-0045 PT Time Calculation (min) (ACUTE ONLY): 19 min  Charges:  $Gait Training: 8-22 mins                     Charlotte Crumb, PT DPT  Board Certified Neurologic Specialist Acute Rehabilitation Services Pager [phone] Office [phone]    Fabio Asa 01/18/2019, 7:46 AM

## 2019-01-18 NOTE — TOC Transition Note (Signed)
Transition of Care [facility]) - CM/SW Discharge Note   Patient Details  Name: Bryan Davies MRN: [id number] Date of Birth: 19-Dec-1950  [facility] CM/SW Contact:  Lawerance Sabal, RN Phone Number: 01/18/2019, 12:18 PM   Clinical Narrative:     Referral made to [facility] per patient request for PT OT. NO other CM needs identified.   Final next level of care: Home w Home Health Services Barriers to Discharge: No Barriers Identified   Patient Goals and CMS Choice        Discharge Placement                       Discharge Plan and Services                          Social Determinants of Health (SDOH) Interventions     Readmission Risk Interventions No flowsheet data found.

## 2019-01-18 NOTE — Plan of Care (Signed)
Pt completed and met care plan goals for this visit. Pt d/c home. D/c instructions done with teach back. Pt verbalize understanding. Pt is stable with no new concerns and will be transported out of the hospital by friend. 

## 2019-01-18 NOTE — Discharge Summary (Addendum)
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 Anxiety   . Hypertension   . Shingles 2013  . Shoulder pain    Past Surgical History:  Procedure Laterality Date  . RADIOLOGY WITH ANESTHESIA N/A 01/15/2019   Procedure: IR WITH ANESTHESIA;  Surgeon: Julieanne Cotton, MD;  Location: [facility] OR;  Service: Radiology;  Laterality: N/A;    Allergies as of 01/18/2019      Reactions   Pollen Extract Other (See Comments)   Sneezing      Medication List    STOP taking these medications   ibuprofen 200 MG tablet Commonly known as:  ADVIL,MOTRIN     TAKE these medications   acetaminophen 500 MG tablet Commonly known as:  TYLENOL Take 500-1,000 mg by mouth every 6 (six) hours as needed for mild pain, moderate pain or headache.   amLODipine 5 MG tablet Commonly known as:  NORVASC Take 1 tablet (5 mg total) by mouth daily.   aspirin 81 MG chewable tablet Chew 1 tablet (81 mg total) by mouth daily.   atorvastatin 40 MG tablet Commonly known as:  LIPITOR Take 1 tablet (40 mg total) by mouth daily.   cyclobenzaprine 10 MG tablet Commonly known as:  FLEXERIL Take 1 tablet (10 mg total) by mouth 2 (two) times daily as needed for muscle spasms.   gabapentin 300 MG capsule Commonly known as:  NEURONTIN Take 1 capsule (300 mg total) by mouth 2 (two) times daily. What changed:  See the new instructions.   nicotine 14 mg/24hr patch Commonly known as:  NICODERM CQ - dosed in mg/24 hours Place 1 patch (14  mg total) onto the skin daily.   terbinafine 250 MG tablet Commonly known as:  LAMISIL Take 250 mg by mouth daily.   ticagrelor 90 MG Tabs tablet Commonly known as:  BRILINTA Take 1 tablet (90 mg total) by mouth 2 (two) times daily.       HOME MEDICATIONS PRIOR TO ADMISSION Medications Prior to Admission  Medication Sig Dispense Refill  . acetaminophen (TYLENOL) 500 MG tablet Take 500-1,000 mg by mouth every 6 (six) hours as needed for mild pain, moderate pain or headache.     Marland Kitchen amLODipine (NORVASC) 5 MG tablet Take 1 tablet (5 mg total) by mouth daily. 90 tablet 0  . ibuprofen (ADVIL,MOTRIN) 200 MG tablet Take 200-400 mg by mouth every 6 (six) hours as needed for headache, mild pain or moderate pain.     Marland Kitchen terbinafine (LAMISIL) 250 MG tablet Take 250 mg by mouth daily.    . [DISCONTINUED] atorvastatin (LIPITOR) 40 MG tablet TAKE 1 TABLET BY MOUTH EVERY DAY (Patient not taking: Reported on 01/25/2015) 90 tablet 0  . [DISCONTINUED] cyclobenzaprine (FLEXERIL) 10 MG tablet Take 1 tablet (10 mg total) by mouth 2 (two) times daily as needed for muscle spasms. (Patient not taking: Reported on 01/25/2015) 20 tablet 0     HOSPITAL MEDICATIONS . aspirin  81 mg Oral Daily  .  atorvastatin  40 mg Oral Daily  . gabapentin  300 mg Oral BID  . ipratropium-albuterol  3 mL Nebulization BID  . nicotine  14 mg Transdermal Daily  . pantoprazole  40 mg Oral Daily  . potassium chloride  40 mEq Oral Once  . tamsulosin  0.4 mg Oral Daily  . ticagrelor  90 mg Oral BID    LABORATORY STUDIES CBC    Component Value Date/Time   WBC 9.3 01/18/2019 0517   RBC 4.29 01/18/2019 0517   HGB 12.5 (L) 01/18/2019 0517   HCT 37.9 (L) 01/18/2019 0517   PLT 132 (L) 01/18/2019 0517   MCV 88.3 01/18/2019 0517   MCH 29.1 01/18/2019 0517   MCHC 33.0 01/18/2019 0517   RDW 12.4 01/18/2019 0517   LYMPHSABS 1.1 01/16/2019 0542   MONOABS 0.8 01/16/2019 0542   EOSABS 0.0 01/16/2019 0542   BASOSABS 0.1 01/16/2019 0542    CMP    Component Value Date/Time   NA 137 01/18/2019 0517   K 3.3 (L) 01/18/2019 0517   CL 107 01/18/2019 0517   CO2 23 01/18/2019 0517   GLUCOSE 107 (H) 01/18/2019 0517   BUN 12 01/18/2019 0517   CREATININE 0.86 01/18/2019 0517   CREATININE 0.79 02/11/2014 1141   CALCIUM 8.1 (L) 01/18/2019 0517   PROT 6.8 01/15/2019 2251   ALBUMIN 3.6 01/15/2019 2251   AST 16 01/15/2019 2251   ALT 20 01/15/2019 2251   ALKPHOS 71 01/15/2019 2251   BILITOT 0.5 01/15/2019 2251   GFRNONAA >60 01/18/2019 0517   GFRAA >60 01/18/2019 0517   COAGS Lab Results  Component Value Date   INR 1.1 01/15/2019   Lipid Panel    Component Value Date/Time   CHOL 152 01/16/2019 0542   TRIG 107 01/16/2019 0542   HDL 35 (L) 01/16/2019 0542   CHOLHDL 4.3 01/16/2019 0542   VLDL 21 01/16/2019 0542   LDLCALC 96 01/16/2019 0542   HgbA1C  Lab Results  Component Value Date   HGBA1C 6.6 (H) 01/16/2019   Urinalysis    Component Value Date/Time   COLORURINE YELLOW 01/15/2019 2003   APPEARANCEUR HAZY (A) 01/15/2019 2003   LABSPEC 1.017 01/15/2019 2003   PHURINE 5.0 01/15/2019 2003   GLUCOSEU NEGATIVE 01/15/2019 2003   HGBUR NEGATIVE 01/15/2019 2003   BILIRUBINUR NEGATIVE 01/15/2019 2003   BILIRUBINUR NEG 12/05/2015 1134   KETONESUR NEGATIVE 01/15/2019 2003   PROTEINUR NEGATIVE 01/15/2019 2003   UROBILINOGEN 1.0 12/05/2015 1134   UROBILINOGEN 1.0 07/12/2011 0946   NITRITE NEGATIVE 01/15/2019 2003   LEUKOCYTESUR NEGATIVE 01/15/2019 2003   Urine Drug Screen     Component Value Date/Time   LABOPIA NONE DETECTED 01/15/2019 2003   COCAINSCRNUR NONE DETECTED 01/15/2019 2003   LABBENZ NONE DETECTED 01/15/2019 2003   AMPHETMU NONE DETECTED 01/15/2019 2003   THCU NONE DETECTED 01/15/2019 2003   LABBARB NONE DETECTED 01/15/2019 2003    Alcohol Level    Component Value Date/Time   ETH <10 01/15/2019 2251     SIGNIFICANT DIAGNOSTIC STUDIES  Ct Angio Head W Or Wo Contrast Ct Angio Neck W Or Wo  Contrast Ct Cerebral Perfusion W Contrast 01/15/2019 IMPRESSION:  1. 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  2. The right MCA is patent, but there is diminished enhancement within the right MCA territory, possibly secondary to occlusion of small distal branches.   Ct Head Wo Contrast 01/15/2019 IMPRESSION:  1. Progressive  loss of gray-white differentiation within the posterior right insula and anterior right temporal lobe. ASPECTS is 8.  2. No intracranial hemorrhage.  3. CT perfusion analysis demonstrating ischemic penumbra of 104 mL. Though there is no core infarct demonstrated by the perfusion scan, the early changes on the noncontrast portion of the study suggest that there most likely is a small core, though clearly smaller than the at risk region.    Mr Brain Wo Contrast 01/16/2019 IMPRESSION:  1. 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  2. Overall infarct territory is smaller than predicted penumbra on the CT perfusion.  3. Mild atrophy and white matter disease otherwise.    Ct Head Code Stroke Wo Contrast 01/15/2019 IMPRESSION:  1. Hyperdense right MCA compatible with acute thrombosis. Negative for acute infarct or hemorrhage  2. ASPECTS is 10    Cerebral angio and Stent Assisted Angioplasty S/P RT common carotid arteriogram followed by completye revascularization of Rt MCA inf division with 3mg  of IA inteegrelin with TICI 2 B revascularization and Revascularization of symptomatic acute occlusion of prox RT ICA with stent assisted angioplasty with prox flow arrest.   Dg Chest Port 1 View 01/16/2019 IMPRESSION:  Subsegmental atelectasis at the right base.   EEG 01/17/2019 Clinical Interpretation:  This normal EEG is recorded in  the waking and sleep state. There was no seizure or seizure predisposition recorded on this study. Please note that lack of epileptiform activity on EEG does not preclude the possibility of epilepsy.      HISTORY OF PRESENT ILLNESS (From Dr Aroor's H&P on 01/15/2019) Bryan Davies is an 68 y.o. male  With PMH of HTN, COPD, HLD, tobacco abuse, bipolar disorder presents to the emergency department after having a passing out episode.  Last known normal was around 5:15 PM.  Patient went to visit his daughter with his girlfriend who has been sick with a cold.  While he was there, smoking a cigarette he suddenly felt sleepy and then later the patient passed out and he was noted to have low pulse.  She started CPR and his pulse return but patient was still unresponsive.  He had an episode where he appeared to stiffen before he passed out.  EMS was called and patient was brought to the ER for syncope.  While waiting in the emergency department, the ED provider noticed patient had a facial droop, drift in the left upper extremity and stroke alerted the patient. She felt there may have been some neglect, however not obvious on my assessment.  Patient's NIH stroke scale was 7 mainly for ataxia, drift, slurred speech and facial droop.  Patient received IV TPA after CT head was negative for hemorrhage.  Stat CT angiogram was performed which showed patient had an occlusion in the right ICA at the origin with reconstitution in the cavernous portion, and paucity of flow to the right MCA without clear cut off. Upon reassessing the patient after CT angiogram-NIHSS had improved to 3.  Discussed with neuro interventional neurologist, and since patient symptoms were mild we decided to proceed with a diagnostic angiogram and possible stenting for tomorrow morning.  However, patient symptoms worsened and it was decided to take the patient for emergent carotid stenting.  Date last known well: 3.19.20 Time last known well:  5.15pm tPA Given: yes NIHSS: 7 Baseline MRS 0    HOSPITAL COURSE Bryan Davies is a 68 y.o. male with history of HTN, COPD, HLD, tobacco abuse, bipolar disorder presenting after having passed out after stiffening. CPR. Found to have facial droop, LUE drift in ED. CTA showed R ICA occlusion. Sent to IR with Rt ICA stent placement and TICI 2b revascularization of Rt MCA inferior branch.    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 ASPECTS 10.     CTA head & neck occlusion R ICA. Diminished R MCA flow.   CT head repeat progressive loss gray-white differentiation posterior R insula and anterior R temporal lobe. APECTS 8. No ICH.  CT perfusion penumbra . No core infarct  Cerebral angio R MCA inf division revascularization TICI 2b w/ Integrilin, R ICA stent assisted angioplasty w/ prox flow arrest  EEG - normal (as above)  Post IR CT no ICH, mass effect or shift  MRI right MCA moderate infarct  2D Echo w/ bubble EF more than 65%  EEG normal, no seizure  LDL 96  HgbA1c 6.6  SCDs for VTE prophylaxis  No antithrombotic prior to admission, now on aspirin 81 mg daily and Brilinta (ticagrelor) 90 mg bid. continue at d/c   Therapy recommendations: Outpatient PT and OT.  Disposition:  Discharge home.  Right ICA occlusion  Status post stent  Likely due to atherosclerosis given risk factors  On aspirin and Brilinta, continue on discharge  Follow-up with Dr. Corliss Skains as outpatient  Tobacco abuse  Current smoker  Smoking cessation counseling provided  Nicotine patch provided  Pt is willing to quit  Hypertension  Home meds: norvasc  Stable  Resumed home meds  Long-term BP goal normotensive  Hyperlipidemia  Home meds: None  LDL 96, goal < 70  On Lipitor 40  Continue statin at  discharge  Other Stroke Risk Factors  Advanced age  Other Active Problems  Leukocytosis 12.0->9.5->9.3 UA neg. CXR - Subsegmental atelectasis at the right base. (afebrile)  BPH w/ Urinary retention on flomax PTA. Difficulty voiding. To check residuals, place foley if appropriate.  Bipolar disorder  COPD  Hypokalemia - 3.3 -> supplement    DISCHARGE EXAM Vitals:   01/17/19 2336 01/18/19 0352 01/18/19 0757 01/18/19 0801  BP: 133/69 129/71 129/66   Pulse: 79 88 79   Resp: 18 18 20    Temp: 98.5 F (36.9 C) 98.3 F (36.8 C) 98.7 F (37.1 C)   TempSrc: Oral Oral Oral   SpO2: 93% 95% 94% 95%  Weight:      Height:       Middle-aged Caucasian male not in distress. . Afebrile. Head is nontraumatic. Neck is supple without bruit.    Cardiac exam no murmur or gallop. Lungs are clear to auscultation. Distal pulses are well felt. Neurological Exam ;  Awake  Alert oriented x 3. Normal speech and language.eye movements full without nystagmus.fundi were not visualized. Vision acuity and fields appear normal. Hearing is normal. Palatal movements are normal. Face symmetric. Tongue midline. Normal strength, tone, reflexes and coordination except left pronator drift and left hand dexterity difficulty. Normal sensation. Gait deferred.   Discharge Diet    Diet Order            Diet regular Room service appropriate? Yes with Assist; Fluid consistency: Thin  Diet effective now  liquids  DISCHARGE PLAN  Disposition:  Discharge to home - Outpt PT and OT  aspirin 81 mg daily and Brilinta (ticagrelor) 90 mg bid for secondary stroke prevention.  Ongoing risk factor control by Primary Care Physician at time of discharge  Follow-up Garth Bigness, MD in 2 weeks.  Follow-up in [facility] in 4 weeks, office to schedule an appointment.   Follow-up Dr Corliss Skains 2 - 4 weeks  35 minutes were spent preparing discharge.  Marvel Plan, MD  PhD Stroke Neurology 01/18/2019 4:31 PM

## 2019-01-18 NOTE — Discharge Instructions (Signed)
Alteplase Treatment for Ischemic Stroke  Alteplase is a medicine that can dissolve blood clots. An ischemic stroke is caused by blood clots that block blood flow to the brain. Alteplase can help treat a stroke if it is given very shortly after stroke symptoms begin. It is most effective when it is used within 0-4 hours after the start of symptoms. Before giving this treatment, the health care provider will carefully consider whether the treatment is appropriate based on your age, condition, and other factors. Tell a health care provider about:  Any allergies you have.  All medicines you are taking, including blood thinners, vitamins, herbs, and over-the-counter medicines.  Any medical conditions you have.  Any blood disorders you have.  Any active bleeding in the last 21 days, including gastrointestinal (GI) or vaginal bleeding.  Any surgeries or procedures you have had.  Whether you are pregnant or may be pregnant.  When your stroke symptoms started. What are the risks? Generally, this is a safe treatment. However, problems may occur, including:  Bleeding into the brain.  Bleeding in other parts of the body.  Allergic reaction. What happens before the treatment?  Your health care provider will perform a physical exam and take a detailed medical history.  Your blood pressure, heart rate, and breathing rate (vital signs) will be monitored closely. You may be given medicines to adjust blood pressure, if needed.  You may have tests, including: ? Blood tests. ? A CT scan. What happens during the treatment?  An IV will be inserted into one of your veins.  Alteplase will be given to you through this IV, usually over the course of 1 hour.  In some cases, this medicine may be given directly into the affected artery through a thin, flexible tube (catheter). This is usually inserted in the artery at the top of your leg.  Your vital signs and brain function will be monitored  closely as you receive this medicine.  If bleeding occurs, the medicine will be stopped and appropriate therapy will be started. What can I expect after treatment?  You will be monitored frequently by your health care team in an intensive care unit or a stroke unit.  You will be evaluated by a speech therapist, physical therapist, or an occupational therapist.  If you had a catheter, you may have bruising, soreness, and swelling at the catheter insertion site.  It may take several days, weeks, or even months to fully determine how you responded to the alteplase treatment. Follow these instructions in the hospital: Medicines  Take over-the-counter and prescription medicines only as told by your health care provider. Activity  Do not get out of bed without help. This is for your safety.  Limit activity after your treatment.  Participate in stroke rehabilitation programs as told by your health care provider. General instructions  Tell a nurse or health care provider right away if you have any bleeding, bruising or injuries.  Use a soft-bristled toothbrush. Brush your teeth gently. Get help right away if you have:  Blood in your vomit, stool, or urine.  A serious fall or accident, or you hit your head.  Symptoms of an allergic reaction such as rash or difficulty breathing.  Any symptoms of a stroke. "BE FAST" is an easy way to remember the main warning signs: ? B - Balance. Signs are dizziness, sudden trouble walking, or loss of balance. ? E - Eyes. Signs are trouble seeing or a sudden change in how you see. ?  F - Face. Signs are sudden weakness or loss of feeling in the face, or the face or eyelid drooping on one side. ? A - Arms. Signs are weakness or loss of feeling in an arm. This happens suddenly and usually on one side of the body. ? S - Speech. Signs are sudden trouble speaking, slurred speech, or trouble understanding what people say. ? T - Time. Time to call emergency  services. Write down what time symptoms started.  Other signs of stroke, such as: ? A sudden, very bad headache with no known cause. ? Nausea or vomiting. ? Seizure. These symptoms may represent a serious problem that is an emergency. Do not wait to see if the symptoms will go away. Get medical help right away.  Summary  Alteplase is a medicine that can dissolve blood clots. It can be used to open blocked arteries during an ischemic stroke.  To be effective, this medicine must be given very shortly after stroke symptoms begin, within 4 hours after the start of symptoms.  You will be monitored closely by your health care team during and after receiving alteplase.  Get help right away if you are showing signs of bleeding or are having symptoms of stroke. This information is not intended to replace advice given to you by your health care provider. Make sure you discuss any questions you have with your health care provider. Document Released: 04/02/2008 Document Revised: 11/12/2017 Document Reviewed: 11/12/2017 Elsevier Interactive Patient Education  2019 [employer]   Alteplase Treatment for Ischemic Stroke  Alteplase is a medicine that can dissolve blood clots. An ischemic stroke is caused by blood clots that block blood flow to the brain. Alteplase can help treat a stroke if it is given very shortly after stroke symptoms begin. It is most effective when it is used within 0-4 hours after the start of symptoms. Before giving this treatment, the health care provider will carefully consider whether the treatment is appropriate based on your age, condition, and other factors. Tell a health care provider about:  Any allergies you have.  All medicines you are taking, including blood thinners, vitamins, herbs, and over-the-counter medicines.  Any medical conditions you have.  Any blood disorders you have.  Any active bleeding in the last 21 days, including gastrointestinal (GI) or  vaginal bleeding.  Any surgeries or procedures you have had.  Whether you are pregnant or may be pregnant.  When your stroke symptoms started. What are the risks? Generally, this is a safe treatment. However, problems may occur, including:  Bleeding into the brain.  Bleeding in other parts of the body.  Allergic reaction. What happens before the treatment?  Your health care provider will perform a physical exam and take a detailed medical history.  Your blood pressure, heart rate, and breathing rate (vital signs) will be monitored closely. You may be given medicines to adjust blood pressure, if needed.  You may have tests, including: ? Blood tests. ? A CT scan. What happens during the treatment?  An IV will be inserted into one of your veins.  Alteplase will be given to you through this IV, usually over the course of 1 hour.  In some cases, this medicine may be given directly into the affected artery through a thin, flexible tube (catheter). This is usually inserted in the artery at the top of your leg.  Your vital signs and brain function will be monitored closely as you receive this medicine.  If bleeding occurs, the  medicine will be stopped and appropriate therapy will be started. What can I expect after treatment?  You will be monitored frequently by your health care team in an intensive care unit or a stroke unit.  You will be evaluated by a speech therapist, physical therapist, or an occupational therapist.  If you had a catheter, you may have bruising, soreness, and swelling at the catheter insertion site.  It may take several days, weeks, or even months to fully determine how you responded to the alteplase treatment. Follow these instructions in the hospital: Medicines  Take over-the-counter and prescription medicines only as told by your health care provider. Activity  Do not get out of bed without help. This is for your safety.  Limit activity after your  treatment.  Participate in stroke rehabilitation programs as told by your health care provider. General instructions  Tell a nurse or health care provider right away if you have any bleeding, bruising or injuries.  Use a soft-bristled toothbrush. Brush your teeth gently. Get help right away if you have:  Blood in your vomit, stool, or urine.  A serious fall or accident, or you hit your head.  Symptoms of an allergic reaction such as rash or difficulty breathing.  Any symptoms of a stroke. "BE FAST" is an easy way to remember the main warning signs: ? B - Balance. Signs are dizziness, sudden trouble walking, or loss of balance. ? E - Eyes. Signs are trouble seeing or a sudden change in how you see. ? F - Face. Signs are sudden weakness or loss of feeling in the face, or the face or eyelid drooping on one side. ? A - Arms. Signs are weakness or loss of feeling in an arm. This happens suddenly and usually on one side of the body. ? S - Speech. Signs are sudden trouble speaking, slurred speech, or trouble understanding what people say. ? T - Time. Time to call emergency services. Write down what time symptoms started.  Other signs of stroke, such as: ? A sudden, very bad headache with no known cause. ? Nausea or vomiting. ? Seizure. These symptoms may represent a serious problem that is an emergency. Do not wait to see if the symptoms will go away. Get medical help right away.  Summary  Alteplase is a medicine that can dissolve blood clots. It can be used to open blocked arteries during an ischemic stroke.  To be effective, this medicine must be given very shortly after stroke symptoms begin, within 4 hours after the start of symptoms.  You will be monitored closely by your health care team during and after receiving alteplase.  Get help right away if you are showing signs of bleeding or are having symptoms of stroke. This information is not intended to replace advice given to you  by your health care provider. Make sure you discuss any questions you have with your health care provider. Document Released: 04/02/2008 Document Revised: 11/12/2017 Document Reviewed: 11/12/2017 Elsevier Interactive Patient Education  2019 [employer]   Alteplase Treatment for Ischemic Stroke  Alteplase is a medicine that can dissolve blood clots. An ischemic stroke is caused by clots that block blood flow to the brain. Alteplase can help treat a stroke if it is given very shortly after stroke symptoms begin. Before giving this medicine, your doctor will decide if the medicine may work for you based on:  Your age.  Your condition.  Other factors. Tell your doctor about:  Any allergies you  have.  All medicines you are taking.  Any blood disorders you have.  Any medical conditions you have.  Any bleeding in the last month, including stomach or vaginal bleeding.  Any surgeries or procedures you have had.  Whether you are pregnant or may be pregnant.  When your symptoms started. What are the risks? Generally, this is a safe treatment. However, problems may happen, including:  Bleeding.  Allergic reactions to the medicine. What happens before the procedure?  Your doctor will do an exam.  Your doctor will check your: ? Blood pressure. ? Heart rate. ? Breathing.  Medicines may be given to adjust your blood pressure, if needed.  You may have tests, such as: ? Blood tests. ? A head scan (CT scan). What happens during the procedure?  An IV tube will be put into one of your veins.  Alteplase will be given to you through the IV tube.  In some cases, this medicine may be given directly to the affected area through a thin tube (catheter). This is usually put in at the top of your leg.  Your health care team will closely watch your: ? Blood pressure. ? Heart rate. ? Breathing.  Your doctor will check often to see how well the medicine is working.  If the  medicine causes bleeding, it will be stopped. Another treatment will be started. What happens after the procedure?  You will be watched closely in the ICU or the stroke unit.  You will be assessed by several specialists.  If you had a catheter, you may have: ? Bruising. ? Soreness. ? Swelling.  It may take days, weeks, or months to fully see how well your body responded to the treatment. Follow these instructions in the hospital: Medicines  Take over-the-counter and prescription medicines only as told by your doctor. Activity  Do not get out of bed without help. This is for your safety.  Limit activity after your treatment.  Do stroke rehab programs as told by your doctor. General instructions  Tell a nurse or doctor right away if you have any bleeding, bruising or injuries.  Use a soft-bristled toothbrush. Brush your teeth gently. Get help right away if you have:  Blood in your vomit, stool, or urine.  A serious fall or accident, or you hit your head.  Symptoms of an allergic reaction such as rash or difficulty breathing.  Any signs of a stroke. "BE FAST" is an easy way to remember the main warning signs. ? B - Balance. Signs are dizziness, sudden trouble walking, or loss of balance. ? E - Eyes. Signs are trouble seeing or a sudden change in how you see. ? F - Face. Signs are sudden weakness or loss of feeling in the face, or the face or eyelid drooping on one side. ? A - Arms. Signs are weakness or loss of feeling in an arm. This happens suddenly and usually on one side of the body. ? S - Speech. Signs are sudden trouble speaking, slurred speech, or trouble understanding what people say. ? T - Time. Time to call emergency services. Write down what time symptoms started.  Other signs of a stroke. This may include: ? A sudden, very bad headache with no known cause. ? Feeling sick to your stomach (nausea). ? Throwing up. ? Uncontrolled, jerky movements  (seizure). These symptoms may represent a serious problem that is an emergency. Do not wait to see if the symptoms will go away. Get medical help right  away.  Summary  Alteplase is a medicine that can break up blood clots. Blood clots can cause a stroke.  This medicine may help if you get it as soon as possible after your stroke symptoms start.  Get help right away if you are showing signs of increased bleeding or are having symptoms of stroke. This information is not intended to replace advice given to you by your health care provider. Make sure you discuss any questions you have with your health care provider. Document Released: 01/30/2011 Document Revised: 11/13/2017 Document Reviewed: 11/13/2017 Elsevier Interactive Patient Education  2019 [employer]   Alteplase Treatment for Ischemic Stroke  Alteplase is a medicine that can dissolve blood clots. An ischemic stroke is caused by clots that block blood flow to the brain. Alteplase can help treat a stroke if it is given very shortly after stroke symptoms begin. Before giving this medicine, your doctor will decide if the medicine may work for you based on:  Your age.  Your condition.  Other factors. Tell your doctor about:  Any allergies you have.  All medicines you are taking.  Any blood disorders you have.  Any medical conditions you have.  Any bleeding in the last month, including stomach or vaginal bleeding.  Any surgeries or procedures you have had.  Whether you are pregnant or may be pregnant.  When your symptoms started. What are the risks? Generally, this is a safe treatment. However, problems may happen, including:  Bleeding.  Allergic reactions to the medicine. What happens before the procedure?  Your doctor will do an exam.  Your doctor will check your: ? Blood pressure. ? Heart rate. ? Breathing.  Medicines may be given to adjust your blood pressure, if needed.  You may have tests, such  as: ? Blood tests. ? A head scan (CT scan). What happens during the procedure?  An IV tube will be put into one of your veins.  Alteplase will be given to you through the IV tube.  In some cases, this medicine may be given directly to the affected area through a thin tube (catheter). This is usually put in at the top of your leg.  Your health care team will closely watch your: ? Blood pressure. ? Heart rate. ? Breathing.  Your doctor will check often to see how well the medicine is working.  If the medicine causes bleeding, it will be stopped. Another treatment will be started. What happens after the procedure?  You will be watched closely in the ICU or the stroke unit.  You will be assessed by several specialists.  If you had a catheter, you may have: ? Bruising. ? Soreness. ? Swelling.  It may take days, weeks, or months to fully see how well your body responded to the treatment. Follow these instructions in the hospital: Medicines  Take over-the-counter and prescription medicines only as told by your doctor. Activity  Do not get out of bed without help. This is for your safety.  Limit activity after your treatment.  Do stroke rehab programs as told by your doctor. General instructions  Tell a nurse or doctor right away if you have any bleeding, bruising or injuries.  Use a soft-bristled toothbrush. Brush your teeth gently. Get help right away if you have:  Blood in your vomit, stool, or urine.  A serious fall or accident, or you hit your head.  Symptoms of an allergic reaction such as rash or difficulty breathing.  Any signs of a stroke. "  BE FAST" is an easy way to remember the main warning signs. ? B - Balance. Signs are dizziness, sudden trouble walking, or loss of balance. ? E - Eyes. Signs are trouble seeing or a sudden change in how you see. ? F - Face. Signs are sudden weakness or loss of feeling in the face, or the face or eyelid drooping on one  side. ? A - Arms. Signs are weakness or loss of feeling in an arm. This happens suddenly and usually on one side of the body. ? S - Speech. Signs are sudden trouble speaking, slurred speech, or trouble understanding what people say. ? T - Time. Time to call emergency services. Write down what time symptoms started.  Other signs of a stroke. This may include: ? A sudden, very bad headache with no known cause. ? Feeling sick to your stomach (nausea). ? Throwing up. ? Uncontrolled, jerky movements (seizure). These symptoms may represent a serious problem that is an emergency. Do not wait to see if the symptoms will go away. Get medical help right away.  Summary  Alteplase is a medicine that can break up blood clots. Blood clots can cause a stroke.  This medicine may help if you get it as soon as possible after your stroke symptoms start.  Get help right away if you are showing signs of increased bleeding or are having symptoms of stroke. This information is not intended to replace advice given to you by your health care provider. Make sure you discuss any questions you have with your health care provider. Document Released: 01/30/2011 Document Revised: 11/13/2017 Document Reviewed: 11/13/2017 Elsevier Interactive Patient Education  2019 [employer].  1. Very important to quit smoking. 2. Be sure to take Brillinta and aspirin daily as instructed to prevent another stroke. 3. Outpatient Physical and Occupational therapy. 4. Keep all MD follow up appointments.

## 2019-01-24 ENCOUNTER — Encounter (HOSPITAL_COMMUNITY): Payer: Self-pay | Admitting: Interventional Radiology

## 2019-01-26 ENCOUNTER — Other Ambulatory Visit (HOSPITAL_COMMUNITY): Payer: Self-pay | Admitting: Interventional Radiology

## 2019-01-26 ENCOUNTER — Encounter (HOSPITAL_COMMUNITY): Payer: Self-pay

## 2019-01-26 DIAGNOSIS — I639 Cerebral infarction, unspecified: Secondary | ICD-10-CM

## 2019-03-26 ENCOUNTER — Emergency Department (HOSPITAL_COMMUNITY)
Admission: EM | Admit: 2019-03-26 | Discharge: 2019-03-27 | Disposition: A | Discharge status: Home / Self Care | Payer: Medicare PPO | Attending: Emergency Medicine | Admitting: Emergency Medicine

## 2019-03-26 ENCOUNTER — Emergency Department (HOSPITAL_COMMUNITY): Payer: Medicare PPO

## 2019-03-26 ENCOUNTER — Encounter (HOSPITAL_COMMUNITY): Payer: Self-pay

## 2019-03-26 ENCOUNTER — Other Ambulatory Visit: Payer: Self-pay

## 2019-03-26 DIAGNOSIS — Z7982 Long term (current) use of aspirin: Secondary | ICD-10-CM | POA: Diagnosis not present

## 2019-03-26 DIAGNOSIS — Z23 Encounter for immunization: Secondary | ICD-10-CM | POA: Insufficient documentation

## 2019-03-26 DIAGNOSIS — F17210 Nicotine dependence, cigarettes, uncomplicated: Secondary | ICD-10-CM | POA: Insufficient documentation

## 2019-03-26 DIAGNOSIS — R4781 Slurred speech: Secondary | ICD-10-CM | POA: Insufficient documentation

## 2019-03-26 DIAGNOSIS — R29810 Facial weakness: Secondary | ICD-10-CM | POA: Diagnosis not present

## 2019-03-26 DIAGNOSIS — R531 Weakness: Secondary | ICD-10-CM | POA: Diagnosis not present

## 2019-03-26 DIAGNOSIS — Z79899 Other long term (current) drug therapy: Secondary | ICD-10-CM | POA: Insufficient documentation

## 2019-03-26 DIAGNOSIS — R410 Disorientation, unspecified: Secondary | ICD-10-CM | POA: Insufficient documentation

## 2019-03-26 DIAGNOSIS — Z8673 Personal history of transient ischemic attack (TIA), and cerebral infarction without residual deficits: Secondary | ICD-10-CM | POA: Diagnosis not present

## 2019-03-26 DIAGNOSIS — I10 Essential (primary) hypertension: Secondary | ICD-10-CM | POA: Insufficient documentation

## 2019-03-26 DIAGNOSIS — R4701 Aphasia: Secondary | ICD-10-CM | POA: Diagnosis present

## 2019-03-26 DIAGNOSIS — R05 Cough: Secondary | ICD-10-CM | POA: Insufficient documentation

## 2019-03-26 LAB — CBG MONITORING, ED: Glucose-Capillary: 120 mg/dL — ABNORMAL HIGH (ref 70–99)

## 2019-03-26 LAB — COMPREHENSIVE METABOLIC PANEL
ALT: 26 U/L (ref 0–44)
AST: 29 U/L (ref 15–41)
Albumin: 3.9 g/dL (ref 3.5–5.0)
Alkaline Phosphatase: 88 U/L (ref 38–126)
Anion gap: 10 (ref 5–15)
BUN: 28 mg/dL — ABNORMAL HIGH (ref 8–23)
CO2: 21 mmol/L — ABNORMAL LOW (ref 22–32)
Calcium: 8.9 mg/dL (ref 8.9–10.3)
Chloride: 107 mmol/L (ref 98–111)
Creatinine, Ser: 1.1 mg/dL (ref 0.61–1.24)
GFR calc Af Amer: >60 mL/min (ref >60)
GFR calc non Af Amer: >60 mL/min (ref >60)
Glucose, Bld: 142 mg/dL — ABNORMAL HIGH (ref 70–99)
Potassium: 3.9 mmol/L (ref 3.5–5.1)
Sodium: 138 mmol/L (ref 135–145)
Total Bilirubin: 0.7 mg/dL (ref 0.3–1.2)
Total Protein: 7.4 g/dL (ref 6.5–8.1)

## 2019-03-26 LAB — APTT: aPTT: 31 seconds (ref 24–36)

## 2019-03-26 LAB — CBC
HCT: 41 % (ref 39.0–52.0)
Hemoglobin: 13.7 g/dL (ref 13.0–17.0)
MCH: 29.9 pg (ref 26.0–34.0)
MCHC: 33.4 g/dL (ref 30.0–36.0)
MCV: 89.5 fL (ref 80.0–100.0)
Platelets: 241 10*3/uL (ref 150–400)
RBC: 4.58 MIL/uL (ref 4.22–5.81)
RDW: 12.7 % (ref 11.5–15.5)
WBC: 8.5 10*3/uL (ref 4.0–10.5)
nRBC: 0 % (ref 0.0–0.2)

## 2019-03-26 LAB — DIFFERENTIAL
Abs Immature Granulocytes: 0.11 10*3/uL — ABNORMAL HIGH (ref 0.00–0.07)
Basophils Absolute: 0 10*3/uL (ref 0.0–0.1)
Basophils Relative: 0 %
Eosinophils Absolute: 0.1 10*3/uL (ref 0.0–0.5)
Eosinophils Relative: 1 %
Immature Granulocytes: 1 %
Lymphocytes Relative: 15 %
Lymphs Abs: 1.3 10*3/uL (ref 0.7–4.0)
Monocytes Absolute: 0.5 10*3/uL (ref 0.1–1.0)
Monocytes Relative: 6 %
Neutro Abs: 6.6 10*3/uL (ref 1.7–7.7)
Neutrophils Relative %: 77 %

## 2019-03-26 LAB — PROTIME-INR
INR: 1 (ref 0.8–1.2)
Prothrombin Time: 12.7 seconds (ref 11.4–15.2)

## 2019-03-26 MED ORDER — SODIUM CHLORIDE 0.9% FLUSH
3.0000 mL | Freq: Once | INTRAVENOUS | Status: DC
Start: 2019-03-26 — End: 2019-03-27

## 2019-03-26 NOTE — ED Triage Notes (Signed)
Pt arrives POV for complaints of slurred speech and confusion that started yesterday per the wife. Pt has hx of stroke. No neuro deficits noted at this time. No slurred speech noted but wife at bedside states he is slurring. Pt alert and oriented, nad noted.

## 2019-03-27 ENCOUNTER — Emergency Department (HOSPITAL_COMMUNITY): Payer: Medicare PPO

## 2019-03-27 LAB — RAPID URINE DRUG SCREEN, HOSP PERFORMED
Amphetamines: NOT DETECTED
Barbiturates: NOT DETECTED
Benzodiazepines: NOT DETECTED
Cocaine: NOT DETECTED
Opiates: NOT DETECTED
Tetrahydrocannabinol: NOT DETECTED

## 2019-03-27 LAB — URINALYSIS, ROUTINE W REFLEX MICROSCOPIC
Bacteria, UA: NONE SEEN
Bilirubin Urine: NEGATIVE
Glucose, UA: NEGATIVE mg/dL
Hgb urine dipstick: NEGATIVE
Ketones, ur: NEGATIVE mg/dL
Nitrite: NEGATIVE
Protein, ur: NEGATIVE mg/dL
Specific Gravity, Urine: 1.018 (ref 1.005–1.030)
pH: 5 (ref 5.0–8.0)

## 2019-03-27 LAB — CBG MONITORING, ED: Glucose-Capillary: 106 mg/dL — ABNORMAL HIGH (ref 70–99)

## 2019-03-27 LAB — ETHANOL: Alcohol, Ethyl (B): <10 mg/dL (ref <10)

## 2019-03-27 MED ORDER — TETANUS-DIPHTH-ACELL PERTUSSIS 5-2.5-18.5 LF-MCG/0.5 IM SUSP
0.5000 mL | Freq: Once | INTRAMUSCULAR | Status: AC
  Administered 2019-03-27: 0.5 mL via INTRAMUSCULAR
  Filled 2019-03-27: qty 0.5

## 2019-03-27 NOTE — Discharge Instructions (Addendum)

## 2019-03-27 NOTE — ED Provider Notes (Signed)
[facility] EMERGENCY DEPARTMENT Provider Note   CSN: [id number] Arrival date & time: 03/26/19  2136    History   Chief Complaint Chief Complaint  Patient presents with  . Aphasia    HPI AKIF WELDY is a 68 y.o. male.     The history is provided by the patient and a significant other.  Weakness  Severity:  Moderate Onset quality:  Gradual Duration:  2 days Timing:  Intermittent Progression:  Improving Chronicity:  New Relieved by:  None tried Worsened by:  Nothing Associated symptoms: cough   Associated symptoms: no fever and no vomiting    Patient with history of stroke, hypertension presents with concern for weakness and slurred speech.  Per significant other, over the past 2 days patient has had episodes of slurred speech, right facial droop, confusion.  Also noted to have difficulty with his balance.  He did hit his head within the previous day and has a small abrasion that they would like a tetanus shot  No fevers or vomiting.  Patient does have a cough but he is a smoker. Past Medical History:  Diagnosis Date  . Anxiety   . Hypertension   . Shingles 2013  . Shoulder pain     Patient Active Problem List   Diagnosis Date Noted  . 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 Ischemic stroke (HCC) 01/15/2019  . Left shoulder pain 12/05/2015  . Urinary hesitancy 12/05/2015  . Pain in lower jaw 07/11/2015  . Hyperlipidemia 02/11/2014  . 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 Dyshydrosis 07/26/2013  . 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 Hypertension 05/21/2012  . Post herpetic neuralgia 05/21/2012  . Bipolar disorder (HCC) 05/21/2012  . Tobacco abuse 05/21/2012    Past Surgical History:  Procedure Laterality Date  . IR ANGIO VERTEBRAL SEL SUBCLAVIAN INNOMINATE UNI R MOD SED  01/16/2019  . IR CT HEAD LTD  01/16/2019  . IR INTRAVSC STENT CERV CAROTID W/O EMB-PROT MOD SED INC ANGIO  01/16/2019  . IR PERCUTANEOUS ART  THROMBECTOMY/INFUSION INTRACRANIAL INC DIAG ANGIO  01/16/2019  . RADIOLOGY WITH ANESTHESIA N/A 01/15/2019   Procedure: IR WITH ANESTHESIA;  Surgeon: Julieanne Cotton, MD;  Location: [facility] OR;  Service: Radiology;  Laterality: N/A;        Home Medications    Prior to Admission medications   Medication Sig Start Date End Date Taking? Authorizing Provider  acetaminophen (TYLENOL) 500 MG tablet Take 500-1,000 mg by mouth every 6 (six) hours as needed for mild pain, moderate pain or headache.     [provider]  amLODipine (NORVASC) 5 MG tablet Take 1 tablet (5 mg total) by mouth daily. 07/25/16   Garth Bigness, MD  aspirin 81 MG chewable tablet Chew 1 tablet (81 mg total) by mouth daily. 01/18/19   Rinehuls, Kinnie Scales, PA-C  atorvastatin (LIPITOR) 40 MG tablet Take 1 tablet (40 mg total) by mouth daily. 01/18/19   Rinehuls, Kinnie Scales, PA-C  cyclobenzaprine (FLEXERIL) 10 MG tablet Take 1 tablet (10 mg total) by mouth 2 (two) times daily as needed for muscle spasms. 01/18/19   Rinehuls, Kinnie Scales, PA-C  gabapentin (NEURONTIN) 300 MG capsule Take 1 capsule (300 mg total) by mouth 2 (two) times daily. 01/18/19   Rinehuls, Kinnie Scales, PA-C  nicotine (NICODERM CQ - DOSED IN MG/24 HOURS) 14 mg/24hr patch Place 1 patch (14 mg total) onto the skin daily. 01/18/19   Rinehuls, David L, PA-C  terbinafine (LAMISIL) 250 MG  tablet Take 250 mg by mouth daily. 12/22/18   [provider]  ticagrelor (BRILINTA) 90 MG TABS tablet Take 1 tablet (90 mg total) by mouth 2 (two) times daily. 01/18/19   Rinehuls, Kinnie Scalesavid L, PA-C    Family History No family history on file.  Social History Social History   Tobacco Use  . Smoking status: Current Every Day Smoker    Packs/day: 0.50    Years: 50.00    Pack years: 25.00    Types: Cigarettes  . Smokeless tobacco: Never Used  . Tobacco comment: 10  cigarettes per day, waits an hour between  Substance Use Topics  . Alcohol use: No  . Drug use: No      Allergies   Pollen extract   Review of Systems Review of Systems  Constitutional: Negative for fever.  Respiratory: Positive for cough.   Gastrointestinal: Negative for vomiting.  Neurological: Positive for speech difficulty and weakness.  All other systems reviewed and are negative.    Physical Exam Updated Vital Signs BP 113/69   Pulse 72   Temp 98.4 F (36.9 C) (Oral)   Resp 20   SpO2 96%   Physical Exam CONSTITUTIONAL: Well developed/well nourished, resting comfortably HEAD: Normocephalic/atraumatic, no signs of trauma EYES: EOMI/PERRL, no nystagmus ENMT: Mucous membranes moist NECK: supple no meningeal signs SPINE/BACK:entire spine nontender CV: S1/S2 noted, no murmurs/rubs/gallops noted LUNGS: Lungs are clear to auscultation bilaterally, no apparent distress ABDOMEN: soft, nontender, no rebound or guarding, bowel sounds noted throughout abdomen GU:no cva tenderness NEURO: Pt is awake/alert/appropriate, moves all extremitiesx4.  No facial droop.  No arm or leg drift ? mild Expressive aphasia EXTREMITIES: pulses normal/equal, full ROM SKIN: warm, color normal PSYCH: no abnormalities of mood noted, alert and oriented to situation   ED Treatments / Results  Labs (all labs ordered are listed, but only abnormal results are displayed) Labs Reviewed  DIFFERENTIAL - Abnormal; Notable for the following components:      Result Value   Abs Immature Granulocytes 0.11 (*)    All other components within normal limits  COMPREHENSIVE METABOLIC PANEL - Abnormal; Notable for the following components:   CO2 21 (*)    Glucose, Bld 142 (*)    BUN 28 (*)    All other components within normal limits  URINALYSIS, ROUTINE W REFLEX MICROSCOPIC - Abnormal; Notable for the following components:   Leukocytes,Ua TRACE (*)    All other components within normal limits  CBG MONITORING, ED - Abnormal; Notable for the following components:   Glucose-Capillary 120 (*)    All other  components within normal limits  CBG MONITORING, ED - Abnormal; Notable for the following components:   Glucose-Capillary 106 (*)    All other components within normal limits  PROTIME-INR  APTT  CBC  ETHANOL  RAPID URINE DRUG SCREEN, HOSP PERFORMED    EKG EKG Interpretation  Date/Time:  Friday Mar 27 2019 01:17:26 EDT Ventricular Rate:  70 PR Interval:    QRS Duration: 98 QT Interval:  418 QTC Calculation: 451 R Axis:   34 Text Interpretation:  Sinus rhythm No significant change since last tracing Confirmed by Zadie RhineWickline, Myosha Cuadras ([id number]) on 03/27/2019 1:20:18 AM   Radiology Ct Head Wo Contrast  Result Date: 03/26/2019 CLINICAL DATA:  Slurred speech and confusion for several hours EXAM: CT HEAD WITHOUT CONTRAST TECHNIQUE: Contiguous axial images were obtained from the base of the skull through the vertex without intravenous contrast. COMPARISON:  01/16/2019 FINDINGS: Brain: No evidence of acute  infarction, hemorrhage, hydrocephalus, extra-axial collection or mass lesion/mass effect. Changes of prior ischemia are noted in the insula and temporal lobe on the right consistent with that seen on prior MRI. Vascular: No hyperdense vessel or unexpected calcification. Skull: Normal. Negative for fracture or focal lesion. Sinuses/Orbits: No acute finding. Other: None. IMPRESSION: No acute abnormality noted. Sequelae from prior ischemia in the right insula and right temporal lobe. Electronically Signed   By: Alcide Clever M.D.   On: 03/26/2019 23:03   Mr Brain Wo Contrast  Result Date: 03/27/2019 CLINICAL DATA:  Initial evaluation for acute slurred speech. History of recent stroke. EXAM: MRI HEAD WITHOUT CONTRAST TECHNIQUE: Multiplanar, multiecho pulse sequences of the brain and surrounding structures were obtained without intravenous contrast. COMPARISON:  Prior CT from earlier same day as well as recent MRI from 01/16/2019. FINDINGS: Brain: Examination moderately degraded by motion artifact. Diffuse  prominence of the CSF containing spaces compatible with generalized age-related cerebral atrophy. Patchy T2/FLAIR hyperintensity within the periventricular and deep white matter both cerebral hemispheres most consistent with chronic small vessel ischemic disease, mild in nature. Scattered encephalomalacia with gliosis within the right insula and overlying right frontal lobe compatible with previous right MCA territory infarct. Scattered foci of chronic hemosiderin staining present within this region. Few scattered subcentimeter foci of diffusion abnormality within this region favored to be artifactual nature due to adjacent susceptibility artifact and/or resolving ischemia (series 5, image 82). No other evidence for acute or subacute ischemia. Gray-white matter differentiation otherwise maintained. No other areas of remote or interval infarction. No other acute or chronic intracranial hemorrhage. No mass lesion, midline shift or mass effect. No hydrocephalus. No extra-axial fluid collection. Pituitary gland suprasellar region normal. Midline structures intact. Vascular: Major intracranial vascular flow voids maintained at the skull base. Skull and upper cervical spine: Craniocervical junction within normal limits. Upper cervical spine normal. Bone marrow signal intensity within normal limits. No scalp soft tissue abnormality. Sinuses/Orbits: Globes and orbital soft tissues within normal limits. Paranasal sinuses are clear. No significant mastoid effusion. Inner ear structures normal. Other: None. IMPRESSION: 1. No acute intracranial abnormality identified. 2. Normal expected interval evolution of previous right MCA territory infarct, now largely chronic in appearance. 3. Underlying mild atrophy with chronic small vessel ischemic disease. Electronically Signed   By: Rise Mu M.D.   On: 03/27/2019 03:31    Procedures Procedures   Medications Ordered in ED Medications  sodium chloride flush (NS) 0.9  % injection 3 mL (has no administration in time range)  Tdap (BOOSTRIX) injection 0.5 mL (has no administration in time range)     Initial Impression / Assessment and Plan / ED Course  I have reviewed the triage vital signs and the nursing notes.  Pertinent labs & imaging results that were available during my care of the patient were reviewed by me and considered in my medical decision making (see chart for details).        1:32 AM Patient presents for concern for stroke.  Family reports the patient's had some slurred speech, confusion, facial droop and difficulty with balance At this time there is no gross motor deficits.  No facial droop.  Questionable aphasia noted but he is sleepy Will obtain MRI  tPA in stroke considered but not given due to: Onset over 3-4.5hours   The patient was counseled on the dangers of tobacco use, and was advised to quit.  Reviewed strategies to maximize success, including support of family/friends.  5:19 AM Extensive work-up in the  ER is negative.  Patient is up walking around in no distress. Unclear cause of symptoms, but he is back to baseline.  Unlikely acute complication of previous stent placement as there is no signs of infarct on MRI. Patient feels comfortable for discharge.  Counseled again on smoking cessation.  Final Clinical Impressions(s) / ED Diagnoses   Final diagnoses:  Weakness    ED Discharge Orders    None       Zadie Rhine, MD 03/27/19 [phone]

## 2019-04-22 ENCOUNTER — Other Ambulatory Visit: Payer: Self-pay

## 2019-04-22 NOTE — Patient Outreach (Signed)
First attempt to obtain mRs. No answer. Left message for return call.  

## 2019-04-28 ENCOUNTER — Other Ambulatory Visit: Payer: Self-pay

## 2019-04-28 NOTE — Patient Outreach (Signed)
Second attempt to obtain mRs. Went straight to [employer]. Left message for return call.

## 2019-04-29 ENCOUNTER — Telehealth (HOSPITAL_COMMUNITY): Payer: Self-pay

## 2019-04-29 NOTE — Telephone Encounter (Signed)
Called to schedule f/u, no answer, left vm. AW 

## 2019-04-30 ENCOUNTER — Other Ambulatory Visit: Payer: Self-pay

## 2019-04-30 NOTE — Patient Outreach (Signed)
3 outreach attempts were completed to obtain mRs. mRs could not be obtained because patient never returned my calls. MRs=7  No DPR on file.  

## 2019-05-02 ENCOUNTER — Other Ambulatory Visit: Payer: Self-pay

## 2019-05-02 ENCOUNTER — Encounter (HOSPITAL_COMMUNITY): Payer: Self-pay | Admitting: Emergency Medicine

## 2019-05-02 ENCOUNTER — Emergency Department (HOSPITAL_COMMUNITY)
Admission: EM | Admit: 2019-05-02 | Discharge: 2019-05-02 | Discharge status: Against Medical Advice | Payer: Medicare Other | Attending: Emergency Medicine | Admitting: Emergency Medicine

## 2019-05-02 DIAGNOSIS — I10 Essential (primary) hypertension: Secondary | ICD-10-CM | POA: Diagnosis not present

## 2019-05-02 DIAGNOSIS — F17210 Nicotine dependence, cigarettes, uncomplicated: Secondary | ICD-10-CM | POA: Insufficient documentation

## 2019-05-02 DIAGNOSIS — Z8673 Personal history of transient ischemic attack (TIA), and cerebral infarction without residual deficits: Secondary | ICD-10-CM | POA: Insufficient documentation

## 2019-05-02 DIAGNOSIS — Z79899 Other long term (current) drug therapy: Secondary | ICD-10-CM | POA: Insufficient documentation

## 2019-05-02 DIAGNOSIS — M25511 Pain in right shoulder: Secondary | ICD-10-CM | POA: Insufficient documentation

## 2019-05-02 DIAGNOSIS — R454 Irritability and anger: Secondary | ICD-10-CM | POA: Insufficient documentation

## 2019-05-02 DIAGNOSIS — R451 Restlessness and agitation: Secondary | ICD-10-CM | POA: Insufficient documentation

## 2019-05-02 DIAGNOSIS — M25522 Pain in left elbow: Secondary | ICD-10-CM | POA: Diagnosis not present

## 2019-05-02 DIAGNOSIS — R45851 Suicidal ideations: Secondary | ICD-10-CM | POA: Insufficient documentation

## 2019-05-02 DIAGNOSIS — Z046 Encounter for general psychiatric examination, requested by authority: Secondary | ICD-10-CM | POA: Insufficient documentation

## 2019-05-02 DIAGNOSIS — F69 Unspecified disorder of adult personality and behavior: Secondary | ICD-10-CM

## 2019-05-02 DIAGNOSIS — Z7982 Long term (current) use of aspirin: Secondary | ICD-10-CM | POA: Diagnosis not present

## 2019-05-02 LAB — COMPREHENSIVE METABOLIC PANEL
ALT: 22 U/L (ref 0–44)
AST: 25 U/L (ref 15–41)
Albumin: 3.6 g/dL (ref 3.5–5.0)
Alkaline Phosphatase: 81 U/L (ref 38–126)
Anion gap: 11 (ref 5–15)
BUN: 27 mg/dL — ABNORMAL HIGH (ref 8–23)
CO2: 20 mmol/L — ABNORMAL LOW (ref 22–32)
Calcium: 8.6 mg/dL — ABNORMAL LOW (ref 8.9–10.3)
Chloride: 103 mmol/L (ref 98–111)
Creatinine, Ser: 1.43 mg/dL — ABNORMAL HIGH (ref 0.61–1.24)
GFR calc Af Amer: 59 mL/min — ABNORMAL LOW (ref >60)
GFR calc non Af Amer: 51 mL/min — ABNORMAL LOW (ref >60)
Glucose, Bld: 77 mg/dL (ref 70–99)
Potassium: 3.8 mmol/L (ref 3.5–5.1)
Sodium: 134 mmol/L — ABNORMAL LOW (ref 135–145)
Total Bilirubin: 0.7 mg/dL (ref 0.3–1.2)
Total Protein: 7.1 g/dL (ref 6.5–8.1)

## 2019-05-02 LAB — CBC WITH DIFFERENTIAL/PLATELET
Abs Immature Granulocytes: 0.09 10*3/uL — ABNORMAL HIGH (ref 0.00–0.07)
Basophils Absolute: 0 10*3/uL (ref 0.0–0.1)
Basophils Relative: 1 %
Eosinophils Absolute: 0.2 10*3/uL (ref 0.0–0.5)
Eosinophils Relative: 3 %
HCT: 41.7 % (ref 39.0–52.0)
Hemoglobin: 13.8 g/dL (ref 13.0–17.0)
Immature Granulocytes: 1 %
Lymphocytes Relative: 21 %
Lymphs Abs: 1.4 10*3/uL (ref 0.7–4.0)
MCH: 30.2 pg (ref 26.0–34.0)
MCHC: 33.1 g/dL (ref 30.0–36.0)
MCV: 91.2 fL (ref 80.0–100.0)
Monocytes Absolute: 0.8 10*3/uL (ref 0.1–1.0)
Monocytes Relative: 12 %
Neutro Abs: 4.1 10*3/uL (ref 1.7–7.7)
Neutrophils Relative %: 62 %
Platelets: 217 10*3/uL (ref 150–400)
RBC: 4.57 MIL/uL (ref 4.22–5.81)
RDW: 13.2 % (ref 11.5–15.5)
WBC: 6.6 10*3/uL (ref 4.0–10.5)
nRBC: 0 % (ref 0.0–0.2)

## 2019-05-02 LAB — ETHANOL: Alcohol, Ethyl (B): <10 mg/dL (ref <10)

## 2019-05-02 NOTE — ED Triage Notes (Signed)
Pt BIB a friend, she reports pt is suicidal with a plan to drive his car off a bridge. Pt denies feeling suicidal, continues to repeat "I'm gonna marry that woman and take care of her". No call back number left for friend of pt.

## 2019-05-02 NOTE — ED Notes (Signed)
PA came to tell the nurse she is going to IVC the patient and the patient had left the room to smoke a cigarette. This RN went outside to speak to the patient and he was about to smoke and said that he had to go home. Charge nurse is aware. [employer] called and is looking for the patient. Security is following his location.
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
## 2019-05-02 NOTE — ED Notes (Addendum)
Patient is preoccupied with sex and thinks he is here to receive viagra. He states that he is gong to get $400 from a stranger. He jumped between multiple conversations and topics without much sense. He is asking to smoke a cigarette and stated "Get the doctor in here but I am going outside to smoke one cigarette in about an hour."

## 2019-05-02 NOTE — ED Notes (Signed)
Patient eloped from the department. Removing him from the system. IVC paperwork in process.

## 2019-05-02 NOTE — ED Notes (Addendum)
Staffing called about a sitter. Stated they don't have anyone currently but will call [facility] and see if they can move someone around. IVC commitment still undecided.

## 2019-05-02 NOTE — ED Notes (Addendum)
PA is finishing IVC paperwork and will take to secretary when completed.

## 2019-05-02 NOTE — ED Notes (Signed)
Security located the patient. The fiancee picked up the patient and is going to bring him back.
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
## 2019-05-03 ENCOUNTER — Emergency Department (HOSPITAL_COMMUNITY)
Admission: EM | Admit: 2019-05-03 | Discharge: 2019-05-04 | Disposition: A | Discharge status: Other Acute Inpatient Hospital | Payer: Medicare Other | Attending: Emergency Medicine | Admitting: Emergency Medicine

## 2019-05-03 ENCOUNTER — Emergency Department (HOSPITAL_COMMUNITY): Payer: Medicare Other

## 2019-05-03 ENCOUNTER — Encounter (HOSPITAL_COMMUNITY): Payer: Self-pay | Admitting: *Deleted

## 2019-05-03 DIAGNOSIS — I10 Essential (primary) hypertension: Secondary | ICD-10-CM | POA: Insufficient documentation

## 2019-05-03 DIAGNOSIS — N289 Disorder of kidney and ureter, unspecified: Secondary | ICD-10-CM | POA: Diagnosis not present

## 2019-05-03 DIAGNOSIS — R45851 Suicidal ideations: Secondary | ICD-10-CM

## 2019-05-03 DIAGNOSIS — R4182 Altered mental status, unspecified: Secondary | ICD-10-CM | POA: Diagnosis present

## 2019-05-03 DIAGNOSIS — Z03818 Encounter for observation for suspected exposure to other biological agents ruled out: Secondary | ICD-10-CM | POA: Insufficient documentation

## 2019-05-03 DIAGNOSIS — F17210 Nicotine dependence, cigarettes, uncomplicated: Secondary | ICD-10-CM | POA: Diagnosis not present

## 2019-05-03 DIAGNOSIS — Z8673 Personal history of transient ischemic attack (TIA), and cerebral infarction without residual deficits: Secondary | ICD-10-CM | POA: Diagnosis not present

## 2019-05-03 DIAGNOSIS — Z79899 Other long term (current) drug therapy: Secondary | ICD-10-CM | POA: Insufficient documentation

## 2019-05-03 DIAGNOSIS — Z7982 Long term (current) use of aspirin: Secondary | ICD-10-CM | POA: Diagnosis not present

## 2019-05-03 LAB — RAPID URINE DRUG SCREEN, HOSP PERFORMED
Amphetamines: NOT DETECTED
Barbiturates: NOT DETECTED
Benzodiazepines: NOT DETECTED
Cocaine: NOT DETECTED
Opiates: NOT DETECTED
Tetrahydrocannabinol: NOT DETECTED

## 2019-05-03 LAB — SARS CORONAVIRUS 2 BY RT PCR (HOSPITAL ORDER, PERFORMED IN ~~LOC~~ HOSPITAL LAB): SARS Coronavirus 2: NEGATIVE

## 2019-05-03 MED ORDER — ASPIRIN 81 MG PO CHEW
81.0000 mg | CHEWABLE_TABLET | Freq: Every day | ORAL | Status: DC
  Administered 2019-05-03 – 2019-05-04 (×2): 81 mg via ORAL
  Filled 2019-05-03 (×2): qty 1

## 2019-05-03 MED ORDER — GABAPENTIN 300 MG PO CAPS
300.0000 mg | ORAL_CAPSULE | Freq: Two times a day (BID) | ORAL | Status: DC
  Administered 2019-05-03 – 2019-05-04 (×3): 300 mg via ORAL
  Filled 2019-05-03 (×3): qty 1

## 2019-05-03 MED ORDER — ALUM & MAG HYDROXIDE-SIMETH 200-200-20 MG/5ML PO SUSP: 30.0000 mL | Freq: Four times a day (QID) | ORAL | Status: DC | PRN

## 2019-05-03 MED ORDER — TICAGRELOR 90 MG PO TABS
90.0000 mg | ORAL_TABLET | Freq: Two times a day (BID) | ORAL | Status: DC
  Administered 2019-05-03 – 2019-05-04 (×3): 90 mg via ORAL
  Filled 2019-05-03 (×3): qty 1

## 2019-05-03 MED ORDER — ONDANSETRON HCL 4 MG PO TABS: 4.0000 mg | ORAL_TABLET | Freq: Three times a day (TID) | ORAL | Status: DC | PRN

## 2019-05-03 MED ORDER — DEXTROMETHORPHAN-QUINIDINE 20-10 MG PO CAPS
1.0000 | ORAL_CAPSULE | Freq: Two times a day (BID) | ORAL | Status: DC
  Administered 2019-05-03 – 2019-05-04 (×3): 1 via ORAL
  Filled 2019-05-03 (×4): qty 1

## 2019-05-03 MED ORDER — AMLODIPINE BESYLATE 5 MG PO TABS
5.0000 mg | ORAL_TABLET | Freq: Every day | ORAL | Status: DC
  Administered 2019-05-03 – 2019-05-04 (×2): 5 mg via ORAL
  Filled 2019-05-03 (×2): qty 1

## 2019-05-03 MED ORDER — ACETAMINOPHEN 325 MG PO TABS
650.0000 mg | ORAL_TABLET | ORAL | Status: DC | PRN
  Administered 2019-05-03: 650 mg via ORAL
  Filled 2019-05-03: qty 2

## 2019-05-03 MED ORDER — NICOTINE 7 MG/24HR TD PT24
7.0000 mg | MEDICATED_PATCH | Freq: Every day | TRANSDERMAL | Status: DC
  Filled 2019-05-03 (×2): qty 1

## 2019-05-03 MED ORDER — ATORVASTATIN CALCIUM 40 MG PO TABS
40.0000 mg | ORAL_TABLET | Freq: Every day | ORAL | Status: DC
  Administered 2019-05-03 – 2019-05-04 (×2): 40 mg via ORAL
  Filled 2019-05-03 (×2): qty 1

## 2019-05-03 NOTE — Progress Notes (Addendum)
Reviewed patient's chart and consulted with Dr. Mallie Darting.  I also contacted patient's girlfriend for collateral information.  Patient's girlfriend and patient's girlfriend and daughter report that the patient is never shown any of the symptoms before his stroke in March.  They state that since his stroke in March he has had significant behavioral changes.  They report that at times he will be in a restaurant and will start yelling at people.  They reported last week that he was having childlike behavior.  And they report that he is also had severe depressive episodes.  He reported he has not followed up with neurology since his stroke and he has refused to go back to the doctor.  Consulted with Dr. Mallie Darting and reviewed chart with me and based on patient's presentation and chart review patient was recommended to be started on Nuedexta to assist with patient's lability. It was also recommended to seek gero-psych placement for the patient.  I have contacted Dr. Ralene Bathe and notified her of the recommendations and she requested me to start the medication at this time.

## 2019-05-03 NOTE — ED Triage Notes (Signed)
Pt continues to have manic speech pattern with aggressive threats of going out side to smoke.  Dsy changed to RT elbow.

## 2019-05-03 NOTE — ED Triage Notes (Signed)
Pt talking  In a rapid repetitive pattern. Pt language aggressive demanding to go out side to smoke ,while at the same time stating I am going home on Tuesday. Pt refused  Nicotine patch .

## 2019-05-03 NOTE — ED Notes (Signed)
TTS interview in progress.  

## 2019-05-03 NOTE — ED Notes (Signed)
Step daughter, Jordan Hawks- [phone] for updates and questions

## 2019-05-03 NOTE — ED Triage Notes (Signed)
PT reports he drinks 14 cups of coffee a day. Pt has been drinking decaf coffee . today
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
## 2019-05-03 NOTE — ED Notes (Signed)
Patient wearing maroon paper scrubs ,wanded by security , personal belongings inventoried at triage.

## 2019-05-03 NOTE — ED Provider Notes (Signed)
[facility]   CSN: [id number] Arrival date & time: 05/03/19  0046    History   Chief Complaint Chief Complaint  Patient presents with  . Medical Clearance    HPI Bryan Davies is a 68 y.o. male.   The history is provided by the patient and medical records. The history is limited by the condition of the patient (Psychiatric disorder).  He has history of hypertension, anxiety, stroke, hyperlipidemia, bipolar disorder and was brought in by police after IVC paperwork was filled out here following and ED visit earlier today.  Apparently, he had told his fiance that he was going to kill himself by driving his car off of the bridge.  Patient adamantly denies any suicidal thoughts to me and denies depression or hallucinations.  He denies alcohol or drug use.  However, he is not very cooperative is a patient in speech is very difficult to understand.  Past Medical History:  Diagnosis Date  . Anxiety   . Hypertension   . Shingles 2013  . Shoulder pain     Patient Active Problem List   Diagnosis Date Noted  . 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 Ischemic stroke (HCC) 01/15/2019  . Left shoulder pain 12/05/2015  . Urinary hesitancy 12/05/2015  . Pain in lower jaw 07/11/2015  . Hyperlipidemia 02/11/2014  . 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 Dyshydrosis 07/26/2013  . 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 Hypertension 05/21/2012  . Post herpetic neuralgia 05/21/2012  . Bipolar disorder (HCC) 05/21/2012  . Tobacco abuse 05/21/2012    Past Surgical History:  Procedure Laterality Date  . IR ANGIO VERTEBRAL SEL SUBCLAVIAN INNOMINATE UNI R MOD SED  01/16/2019  . IR CT HEAD LTD  01/16/2019  . IR INTRAVSC STENT CERV CAROTID W/O EMB-PROT MOD SED INC ANGIO  01/16/2019  . IR PERCUTANEOUS ART THROMBECTOMY/INFUSION INTRACRANIAL INC DIAG ANGIO  01/16/2019  . RADIOLOGY WITH ANESTHESIA N/A 01/15/2019   Procedure: IR WITH  ANESTHESIA;  Surgeon: Julieanne Cottoneveshwar, Sanjeev, MD;  Location: [facility] OR;  Service: Radiology;  Laterality: N/A;        Home Medications    Prior to Admission medications   Medication Sig Start Date End Date Taking? Authorizing Provider  acetaminophen (TYLENOL) 500 MG tablet Take 500-1,000 mg by mouth every 6 (six) hours as needed for mild pain, moderate pain or headache.     [provider]  amLODipine (NORVASC) 5 MG tablet Take 1 tablet (5 mg total) by mouth daily. 07/25/16   Garth Bignessimberlake, Kathryn, MD  aspirin 81 MG chewable tablet Chew 1 tablet (81 mg total) by mouth daily. 01/18/19   Rinehuls, Kinnie Scalesavid L, PA-C  atorvastatin (LIPITOR) 40 MG tablet Take 1 tablet (40 mg total) by mouth daily. 01/18/19   Rinehuls, Kinnie Scalesavid L, PA-C  cyclobenzaprine (FLEXERIL) 10 MG tablet Take 1 tablet (10 mg total) by mouth 2 (two) times daily as needed for muscle spasms. 01/18/19   Rinehuls, Kinnie Scalesavid L, PA-C  gabapentin (NEURONTIN) 300 MG capsule Take 1 capsule (300 mg total) by mouth 2 (two) times daily. 01/18/19   Rinehuls, Kinnie Scalesavid L, PA-C  nicotine (NICODERM CQ - DOSED IN MG/24 HOURS) 14 mg/24hr patch Place 1 patch (14 mg total) onto the skin daily. 01/18/19   Rinehuls, Chelci [place] L, PA-C  terbinafine (LAMISIL) 250 MG tablet Take 250 mg by mouth daily. 12/22/18   [provider]  ticagrelor (BRILINTA) 90 MG TABS tablet Take 1 tablet (90 mg total) by  mouth 2 (two) times daily. 01/18/19   Rinehuls, Kinnie Scalesavid L, PA-C    Family History No family history on file.  Social History Social History   Tobacco Use  . Smoking status: Current Every Day Smoker    Packs/day: 0.50    Years: 50.00    Pack years: 25.00    Types: Cigarettes  . Smokeless tobacco: Never Used  . Tobacco comment: 10  cigarettes per day, waits an hour between  Substance Use Topics  . Alcohol use: No  . Drug use: No     Allergies   Pollen extract   Review of Systems Review of Systems  Unable to perform ROS: Psychiatric disorder     Physical  Exam Updated Vital Signs BP (!) 120/100 (BP Location: Right Arm)   Pulse (!) 125   Temp 98.5 F (36.9 C) (Oral)   Resp 18   SpO2 97%   Physical Exam Vitals signs and nursing note reviewed.    68 year old male, resting comfortably and in no acute distress. Vital signs are significant for elevated heart rate and blood pressure. Oxygen saturation is 97%, which is normal. Head is normocephalic and atraumatic. PERRLA, EOMI. Oropharynx is clear. Neck is nontender and supple without adenopathy or JVD. Back is nontender and there is no CVA tenderness. Lungs are clear without rales, wheezes, or rhonchi. Chest is nontender. Heart has regular rate and rhythm without murmur. Abdomen is soft, flat, nontender without masses or hepatosplenomegaly and peristalsis is normoactive. Extremities have no cyanosis or edema, full range of motion is present. Skin is warm and dry without rash. Neurologic: Alternating between somnolent and agitation, speech is somewhat slurred and difficult to understand, cranial nerves are intact, there are no motor or sensory deficits.  ED Treatments / Results   Procedures Procedures   Medications Ordered in ED Medications  nicotine (NICODERM CQ - dosed in mg/24 hr) patch 7 mg (has no administration in time range)  alum & mag hydroxide-simeth (MAALOX/MYLANTA) 200-200-20 MG/5ML suspension 30 mL (has no administration in time range)  ondansetron (ZOFRAN) tablet 4 mg (has no administration in time range)  acetaminophen (TYLENOL) tablet 650 mg (has no administration in time range)  amLODipine (NORVASC) tablet 5 mg (has no administration in time range)  aspirin chewable tablet 81 mg (has no administration in time range)  atorvastatin (LIPITOR) tablet 40 mg (has no administration in time range)  gabapentin (NEURONTIN) capsule 300 mg (has no administration in time range)  ticagrelor (BRILINTA) tablet 90 mg (has no administration in time range)   Results for orders placed  or performed during the hospital encounter of 05/03/19  Urine rapid drug screen (hosp performed)  Result Value Ref Range   Opiates NONE DETECTED NONE DETECTED   Cocaine NONE DETECTED NONE DETECTED   Benzodiazepines NONE DETECTED NONE DETECTED   Amphetamines NONE DETECTED NONE DETECTED   Tetrahydrocannabinol NONE DETECTED NONE DETECTED   Barbiturates NONE DETECTED NONE DETECTED     Initial Impression / Assessment and Plan / ED Course  I have reviewed the triage vital signs and the nursing notes.  Pertinent labs & imaging results that were available during my care of the patient were reviewed by me and considered in my medical decision making (see chart for details).  Report of suicidal ideation and patient is clearly having altered mentation.  Labs had been done at prior ED visit and were significant only for mild renal insufficiency.  Drug screen had not been obtained and will be requested  now.  Will request TTS consultation.  Drug screen is negative.  TTS consultation is pending.  Final Clinical Impressions(s) / ED Diagnoses   Final diagnoses:  Suicidal ideation  Renal insufficiency    ED Discharge Orders    None       Delora Fuel, MD 55/01/58 [phone]

## 2019-05-03 NOTE — ED Triage Notes (Signed)
Pt was provided with snacks by [employer].
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
## 2019-05-03 NOTE — Progress Notes (Signed)
Patient meets criteria for inpatient treatment. No appropriate or available beds at [facility]. CSW faxed referrals to the following facilities for review:  [facility]  [facility]  [facility]  [facility]  [facility]   TTS will continue to seek bed placement.  Chalmers Guest. Guerry Bruin, MSW, [place] Work/Disposition Phone: [phone] Fax: [phone]

## 2019-05-03 NOTE — ED Notes (Signed)
Ordered diet tray for pt  

## 2019-05-04 NOTE — Progress Notes (Signed)
Pt accepted to  [facility] Kristie Cowman, MD is the accepting/attending provider.  Call report to [phone]  [facility] ED notified.   Pt is IVC   Pt may be transported by [employer] Pt scheduled  to arrive at [facility] as soon as transport can be arranged  Romie Minus T. Judi Cong, MSW, [place] Disposition Clinical Social Work [phone] (cell) [phone] (office)

## 2019-05-04 NOTE — ED Notes (Signed)
Report given to Laure Kidney RN at [facility] [phone]

## 2019-05-04 NOTE — BH Assessment (Signed)
[facility] Assessment Progress Note    Per Mordecai Maes, NP, Inpatient treatment continues to be recommended

## 2019-05-04 NOTE — Progress Notes (Signed)
[facility] is reviewing patient referral information.  CSW requested IVC from [place], [place].  Areatha Keas. Judi Cong, MSW, [place] Disposition Clinical Social Work [phone] (cell) [phone] (office)
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
## 2019-05-04 NOTE — ED Notes (Signed)
Pt. Asking to speak with RN.

## 2019-05-04 NOTE — ED Notes (Signed)
Ordered bfast 
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
## 2019-05-04 NOTE — ED Notes (Signed)
Pt. At the desk at this time making a phone call

## 2019-05-04 NOTE — ED Notes (Signed)
Lunch ordered 

## 2019-05-04 NOTE — ED Notes (Signed)
Sheriff called and said she will arrive at approximately 1700.

## 2019-05-04 NOTE — ED Notes (Signed)
Called Sheriff's office spoke with [facility] [phone] requesting transportation. Stated will call back when 30 minutes away.

## 2019-05-04 NOTE — ED Notes (Signed)
Spoke with ED doctor regarding plan of care.

## 2019-05-13 ENCOUNTER — Emergency Department (HOSPITAL_COMMUNITY)
Admission: EM | Admit: 2019-05-13 | Discharge: 2019-05-13 | Disposition: A | Discharge status: Home / Self Care | Payer: Medicare Other | Attending: Emergency Medicine | Admitting: Emergency Medicine

## 2019-05-13 ENCOUNTER — Other Ambulatory Visit: Payer: Self-pay

## 2019-05-13 ENCOUNTER — Encounter (HOSPITAL_COMMUNITY): Payer: Self-pay | Admitting: Family Medicine

## 2019-05-13 ENCOUNTER — Emergency Department (HOSPITAL_COMMUNITY): Payer: Medicare Other

## 2019-05-13 DIAGNOSIS — Z79899 Other long term (current) drug therapy: Secondary | ICD-10-CM | POA: Insufficient documentation

## 2019-05-13 DIAGNOSIS — M25551 Pain in right hip: Secondary | ICD-10-CM | POA: Insufficient documentation

## 2019-05-13 DIAGNOSIS — I10 Essential (primary) hypertension: Secondary | ICD-10-CM | POA: Diagnosis not present

## 2019-05-13 DIAGNOSIS — F17210 Nicotine dependence, cigarettes, uncomplicated: Secondary | ICD-10-CM | POA: Diagnosis not present

## 2019-05-13 DIAGNOSIS — Z7982 Long term (current) use of aspirin: Secondary | ICD-10-CM | POA: Insufficient documentation

## 2019-05-13 MED ORDER — DEXAMETHASONE SODIUM PHOSPHATE 10 MG/ML IJ SOLN
10.0000 mg | Freq: Once | INTRAMUSCULAR | Status: AC
  Administered 2019-05-13: 10 mg via INTRAMUSCULAR
  Filled 2019-05-13: qty 1

## 2019-05-13 NOTE — Discharge Instructions (Addendum)
You can take tylenol for pain. Follow-up with your primary care doctor. Return here for any new/acute changes.
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
## 2019-05-15 ENCOUNTER — Inpatient Hospital Stay: Payer: Medicare Other | Admitting: Family Medicine

## 2019-05-19 ENCOUNTER — Telehealth (HOSPITAL_COMMUNITY): Payer: Self-pay

## 2019-05-19 NOTE — Telephone Encounter (Signed)
Called to schedule f/u, no answer, left vm. AW 

## 2019-05-26 ENCOUNTER — Encounter (HOSPITAL_COMMUNITY): Payer: Self-pay

## 2019-05-26 ENCOUNTER — Other Ambulatory Visit: Payer: Self-pay

## 2019-05-26 ENCOUNTER — Emergency Department (HOSPITAL_COMMUNITY)
Admission: EM | Admit: 2019-05-26 | Discharge: 2019-05-27 | Disposition: A | Discharge status: Other Acute Inpatient Hospital | Payer: Medicare Other | Source: Home / Self Care | Attending: Emergency Medicine | Admitting: Emergency Medicine

## 2019-05-26 DIAGNOSIS — F29 Unspecified psychosis not due to a substance or known physiological condition: Secondary | ICD-10-CM

## 2019-05-26 DIAGNOSIS — F309 Manic episode, unspecified: Secondary | ICD-10-CM

## 2019-05-26 DIAGNOSIS — I10 Essential (primary) hypertension: Secondary | ICD-10-CM | POA: Insufficient documentation

## 2019-05-26 DIAGNOSIS — Z7982 Long term (current) use of aspirin: Secondary | ICD-10-CM | POA: Insufficient documentation

## 2019-05-26 DIAGNOSIS — F17210 Nicotine dependence, cigarettes, uncomplicated: Secondary | ICD-10-CM | POA: Insufficient documentation

## 2019-05-26 DIAGNOSIS — Z79899 Other long term (current) drug therapy: Secondary | ICD-10-CM | POA: Insufficient documentation

## 2019-05-26 DIAGNOSIS — F3110 Bipolar disorder, current episode manic without psychotic features, unspecified: Secondary | ICD-10-CM | POA: Insufficient documentation

## 2019-05-26 DIAGNOSIS — F319 Bipolar disorder, unspecified: Secondary | ICD-10-CM | POA: Diagnosis not present

## 2019-05-26 LAB — COMPREHENSIVE METABOLIC PANEL
ALT: 16 U/L (ref 0–44)
AST: 22 U/L (ref 15–41)
Albumin: 3.8 g/dL (ref 3.5–5.0)
Alkaline Phosphatase: 90 U/L (ref 38–126)
Anion gap: 10 (ref 5–15)
BUN: 17 mg/dL (ref 8–23)
CO2: 23 mmol/L (ref 22–32)
Calcium: 8.5 mg/dL — ABNORMAL LOW (ref 8.9–10.3)
Chloride: 103 mmol/L (ref 98–111)
Creatinine, Ser: 0.99 mg/dL (ref 0.61–1.24)
GFR calc Af Amer: >60 mL/min (ref >60)
GFR calc non Af Amer: >60 mL/min (ref >60)
Glucose, Bld: 85 mg/dL (ref 70–99)
Potassium: 4.2 mmol/L (ref 3.5–5.1)
Sodium: 136 mmol/L (ref 135–145)
Total Bilirubin: 0.6 mg/dL (ref 0.3–1.2)
Total Protein: 7.3 g/dL (ref 6.5–8.1)

## 2019-05-26 LAB — ACETAMINOPHEN LEVEL: Acetaminophen (Tylenol), Serum: <10 ug/mL — ABNORMAL LOW (ref 10–30)

## 2019-05-26 LAB — CBC
HCT: 43.1 % (ref 39.0–52.0)
Hemoglobin: 14.1 g/dL (ref 13.0–17.0)
MCH: 29.6 pg (ref 26.0–34.0)
MCHC: 32.7 g/dL (ref 30.0–36.0)
MCV: 90.4 fL (ref 80.0–100.0)
Platelets: 261 10*3/uL (ref 150–400)
RBC: 4.77 MIL/uL (ref 4.22–5.81)
RDW: 12.8 % (ref 11.5–15.5)
WBC: 8.8 10*3/uL (ref 4.0–10.5)
nRBC: 0 % (ref 0.0–0.2)

## 2019-05-26 LAB — RAPID URINE DRUG SCREEN, HOSP PERFORMED
Amphetamines: NOT DETECTED
Barbiturates: NOT DETECTED
Benzodiazepines: NOT DETECTED
Cocaine: NOT DETECTED
Opiates: NOT DETECTED
Tetrahydrocannabinol: NOT DETECTED

## 2019-05-26 LAB — SALICYLATE LEVEL: Salicylate Lvl: <7 mg/dL (ref 2.8–30.0)

## 2019-05-26 LAB — ETHANOL: Alcohol, Ethyl (B): <10 mg/dL (ref <10)

## 2019-05-26 MED ORDER — GABAPENTIN 300 MG PO CAPS
300.0000 mg | ORAL_CAPSULE | Freq: Two times a day (BID) | ORAL | Status: DC
  Administered 2019-05-27: 300 mg via ORAL
  Filled 2019-05-26: qty 1

## 2019-05-26 MED ORDER — NICOTINE 21 MG/24HR TD PT24
21.0000 mg | MEDICATED_PATCH | Freq: Every day | TRANSDERMAL | Status: DC
  Filled 2019-05-26: qty 1

## 2019-05-26 MED ORDER — AMLODIPINE BESYLATE 5 MG PO TABS
5.0000 mg | ORAL_TABLET | Freq: Every day | ORAL | Status: DC
  Administered 2019-05-27: 5 mg via ORAL
  Filled 2019-05-26: qty 1

## 2019-05-26 MED ORDER — SENNOSIDES-DOCUSATE SODIUM 8.6-50 MG PO TABS: 2.0000 | ORAL_TABLET | ORAL | Status: DC | PRN

## 2019-05-26 MED ORDER — ZIPRASIDONE MESYLATE 20 MG IM SOLR
10.0000 mg | Freq: Once | INTRAMUSCULAR | Status: AC
  Administered 2019-05-27: 10 mg via INTRAMUSCULAR
  Filled 2019-05-26: qty 20

## 2019-05-26 MED ORDER — LOSARTAN POTASSIUM 25 MG PO TABS
25.0000 mg | ORAL_TABLET | Freq: Every day | ORAL | Status: DC
  Administered 2019-05-27: 25 mg via ORAL
  Filled 2019-05-26: qty 1

## 2019-05-26 MED ORDER — TAMSULOSIN HCL 0.4 MG PO CAPS
0.4000 mg | ORAL_CAPSULE | Freq: Every day | ORAL | Status: DC
  Administered 2019-05-27: 0.4 mg via ORAL
  Filled 2019-05-26: qty 1

## 2019-05-26 MED ORDER — ATORVASTATIN CALCIUM 40 MG PO TABS
40.0000 mg | ORAL_TABLET | Freq: Every day | ORAL | Status: DC
  Administered 2019-05-27: 40 mg via ORAL
  Filled 2019-05-26: qty 1

## 2019-05-26 MED ORDER — TICAGRELOR 90 MG PO TABS
90.0000 mg | ORAL_TABLET | Freq: Two times a day (BID) | ORAL | Status: DC
  Administered 2019-05-27: 90 mg via ORAL
  Filled 2019-05-26 (×3): qty 1

## 2019-05-26 MED ORDER — ASPIRIN 81 MG PO CHEW
81.0000 mg | CHEWABLE_TABLET | Freq: Every day | ORAL | Status: DC
  Administered 2019-05-27: 11:00:00 81 mg via ORAL
  Filled 2019-05-26: qty 1

## 2019-05-26 NOTE — ED Provider Notes (Signed)
TIME SEEN: 11:33 PM  CHIEF COMPLAINT: "I am here to get my head checked"  HPI: Patient is a 68 year old male with history of hypertension, previous strokes, bipolar disorder who presents to the emergency department stating "I am here to get my head checked".  States he was encouraged to come here by his fiance.  He is unable to tell me why he is here but is requesting to leave so that he can go "put on my street clothes, smoke a cigarette and go home".  He denies SI, HI or hallucinations.  He denies any chest pain, shortness of breath, fever, cough, vomiting, diarrhea.  I have had lengthy discussion with patient's fianc Erica.  Her phone number is [phone].  She states that over the past several days patient appears to be acutely worsening.  She states that he is paranoid and thinks that people are following him to kill him and states that people have been calling his phone stating they are going to kill him.  He said she states when the patient's son looks at his phone there has not been any calls.  She states that he is also told her that he was going to kill himself by getting into a high-speed chase with police.  She states that he seems to be having auditory hallucinations.  She states that he is smoking 5 to 6 packs of cigarettes a day, not taking his psychiatric medications, stealing things.  She reports that he has been pacing the parking lot at night and knocking on the neighbors door all night long.  She is concerned that he is manic.  ROS: See HPI Constitutional: no fever  Eyes: no drainage  ENT: no runny nose   Cardiovascular:  no chest pain  Resp: no SOB  GI: no vomiting GU: no dysuria Integumentary: no rash  Allergy: no hives  Musculoskeletal: no leg swelling  Neurological: no slurred speech ROS otherwise negative  PAST MEDICAL HISTORY/PAST SURGICAL HISTORY:  Past Medical History:  Diagnosis Date  . Anxiety   . Hypertension   . Shingles 2013  . Shoulder pain      MEDICATIONS:  Prior to Admission medications   Medication Sig Start Date End Date Taking? Authorizing Provider  acetaminophen (TYLENOL) 500 MG tablet Take 500-1,000 mg by mouth every 6 (six) hours as needed for mild pain, moderate pain or headache.    Yes [provider]  amLODipine (NORVASC) 5 MG tablet Take 1 tablet (5 mg total) by mouth daily. 07/25/16  Yes Sela Hilding, MD  aspirin 81 MG chewable tablet Chew 1 tablet (81 mg total) by mouth daily. 01/18/19  Yes Rinehuls, Early Chars, PA-C  atorvastatin (LIPITOR) 40 MG tablet Take 1 tablet (40 mg total) by mouth daily. 01/18/19  Yes Rinehuls, Early Chars, PA-C  gabapentin (NEURONTIN) 300 MG capsule Take 1 capsule (300 mg total) by mouth 2 (two) times daily. 01/18/19  Yes Rinehuls, Early Chars, PA-C  losartan (COZAAR) 25 MG tablet Take 25 mg by mouth daily. 01/30/19  Yes [provider]  sennosides-docusate sodium (SENOKOT-S) 8.6-50 MG tablet Take 2 tablets by mouth daily as needed for constipation.   Yes [provider]  tamsulosin (FLOMAX) 0.4 MG CAPS capsule Take 0.4 mg by mouth daily. 03/27/19  Yes [provider]  ticagrelor (BRILINTA) 90 MG TABS tablet Take 1 tablet (90 mg total) by mouth 2 (two) times daily. 01/18/19  Yes Rinehuls, Early Chars, PA-C  cyclobenzaprine (FLEXERIL) 10 MG tablet Take 1 tablet (10  mg total) by mouth 2 (two) times daily as needed for muscle spasms. Patient not taking: Reported on 05/04/2019 01/18/19   Rinehuls, Kinnie Scalesavid L, PA-C  nicotine (NICODERM CQ - DOSED IN MG/24 HOURS) 14 mg/24hr patch Place 1 patch (14 mg total) onto the skin daily. Patient not taking: Reported on 05/04/2019 01/18/19   Rinehuls, Kinnie Scalesavid L, PA-C    ALLERGIES:  Allergies  Allergen Reactions  . Pollen Extract Other (See Comments)    Sneezing     SOCIAL HISTORY:  Social History   Tobacco Use  . Smoking status: Current Every Day Smoker    Packs/day: 1.00    Years: 50.00    Pack years: 50.00    Types: Cigarettes  .  Smokeless tobacco: Never Used  . Tobacco comment: 10  cigarettes per day, waits an hour between  Substance Use Topics  . Alcohol use: No    FAMILY HISTORY: No family history on file.  EXAM: BP (!) 158/76 (BP Location: Right Arm)   Pulse (!) 102   Temp 98.2 F (36.8 C) (Oral)   Resp 16   SpO2 98%  CONSTITUTIONAL: Alert and oriented and responds appropriately to questions.  Chronically ill-appearing HEAD: Normocephalic EYES: Conjunctivae clear, pupils appear equal, EOMI ENT: normal nose; moist mucous membranes NECK: Supple, no meningismus, no nuchal rigidity, no LAD  CARD: RRR; S1 and S2 appreciated; no murmurs, no clicks, no rubs, no gallops RESP: Normal chest excursion without splinting or tachypnea; breath sounds clear and equal bilaterally; no wheezes, no rhonchi, no rales, no hypoxia or respiratory distress, speaking full sentences ABD/GI: Normal bowel sounds; non-distended; soft, non-tender, no rebound, no guarding, no peritoneal signs, no hepatosplenomegaly BACK:  The back appears normal and is non-tender to palpation, there is no CVA tenderness EXT: Normal ROM in all joints; non-tender to palpation; no edema; normal capillary refill; no cyanosis, no calf tenderness or swelling    SKIN: Normal color for age and race; warm; no rash NEURO: Moves all extremities equally PSYCH: Patient has rapid, pressured, tangential speech.  He denies SI, HI or hallucinations.  He appears manic today with poor insight.  MEDICAL DECISION MAKING: Patient here with concerns for mania, psychosis.  Patient requesting to leave but I do not feel he has capacity to make this decision.  I feel he would benefit from psychiatric admission.  I have placed patient under IVC.  Patient screening labs, urine unremarkable.  Will obtain COVID swab.  Will give IM Geodon for agitation and consult TTS.  ED PROGRESS: Patient's labs and urine are unremarkable.  He is medically cleared and awaiting TTS evaluation for  disposition.  I feel he would benefit from psychiatric admission.  He is under IVC.   I reviewed all nursing notes, vitals, pertinent previous records, EKGs, lab and urine results, imaging (as available).      Ward, Layla MawKristen N, DO 05/27/19 [phone]

## 2019-05-26 NOTE — ED Notes (Signed)
Dr Leonides Schanz at bedside to eval pt.

## 2019-05-26 NOTE — ED Notes (Signed)
Pt presents for medical clearance, denies any problems, adamantly requesting to go home.  Denies SI, HI or AVH.  Pt rambling to self, stating he doesn't want to be here.  A&O x 3, no distress noted, cooperative at present.  Monitoring for safety.

## 2019-05-26 NOTE — ED Notes (Signed)
Pt changed into burgundy colored scrubs and yellow socks. Belongings placed in bag and into triage cabinet.

## 2019-05-26 NOTE — ED Triage Notes (Signed)
Pt arrived because he stating his girlfriend thinks he needs help. Denies any pain. Denies alcohol or drug use. Denies any thoughts of hurting himself or other. Pt can not explain to this nurse why he is here, continues to state "I came because my girlfriend says I need my head examined"

## 2019-05-26 NOTE — ED Notes (Addendum)
Pt agreed to let this nurse speak with his significant other about why she requested him to get screened. Per significant other pt was recently [facility] and discharged, since he has not taking his medication. States he was having hallucinations and delusions. Pt has also been driving and acting recklessly.

## 2019-05-27 ENCOUNTER — Encounter (HOSPITAL_COMMUNITY): Payer: Self-pay | Admitting: Behavioral Health

## 2019-05-27 ENCOUNTER — Encounter (HOSPITAL_COMMUNITY): Payer: Self-pay

## 2019-05-27 ENCOUNTER — Inpatient Hospital Stay (HOSPITAL_COMMUNITY)
Admission: AD | Admit: 2019-05-27 | Discharge: 2019-06-01 | DRG: 885 | Disposition: A | Discharge status: Home / Self Care | Payer: Medicare Other | Attending: Psychiatry | Admitting: Psychiatry

## 2019-05-27 DIAGNOSIS — F22 Delusional disorders: Secondary | ICD-10-CM | POA: Diagnosis present

## 2019-05-27 DIAGNOSIS — F419 Anxiety disorder, unspecified: Secondary | ICD-10-CM | POA: Diagnosis present

## 2019-05-27 DIAGNOSIS — Z8673 Personal history of transient ischemic attack (TIA), and cerebral infarction without residual deficits: Secondary | ICD-10-CM

## 2019-05-27 DIAGNOSIS — R4781 Slurred speech: Secondary | ICD-10-CM | POA: Diagnosis present

## 2019-05-27 DIAGNOSIS — J449 Chronic obstructive pulmonary disease, unspecified: Secondary | ICD-10-CM | POA: Diagnosis present

## 2019-05-27 DIAGNOSIS — R634 Abnormal weight loss: Secondary | ICD-10-CM | POA: Diagnosis present

## 2019-05-27 DIAGNOSIS — Z888 Allergy status to other drugs, medicaments and biological substances status: Secondary | ICD-10-CM | POA: Diagnosis not present

## 2019-05-27 DIAGNOSIS — Z9114 Patient's other noncompliance with medication regimen: Secondary | ICD-10-CM | POA: Diagnosis not present

## 2019-05-27 DIAGNOSIS — Z7982 Long term (current) use of aspirin: Secondary | ICD-10-CM

## 2019-05-27 DIAGNOSIS — F17210 Nicotine dependence, cigarettes, uncomplicated: Secondary | ICD-10-CM | POA: Diagnosis present

## 2019-05-27 DIAGNOSIS — F319 Bipolar disorder, unspecified: Secondary | ICD-10-CM | POA: Diagnosis present

## 2019-05-27 DIAGNOSIS — I10 Essential (primary) hypertension: Secondary | ICD-10-CM | POA: Diagnosis present

## 2019-05-27 DIAGNOSIS — F4010 Social phobia, unspecified: Secondary | ICD-10-CM | POA: Diagnosis present

## 2019-05-27 DIAGNOSIS — G4700 Insomnia, unspecified: Secondary | ICD-10-CM | POA: Diagnosis present

## 2019-05-27 DIAGNOSIS — G522 Disorders of vagus nerve: Secondary | ICD-10-CM

## 2019-05-27 DIAGNOSIS — Z7902 Long term (current) use of antithrombotics/antiplatelets: Secondary | ICD-10-CM

## 2019-05-27 DIAGNOSIS — Z79899 Other long term (current) drug therapy: Secondary | ICD-10-CM

## 2019-05-27 MED ORDER — TRAZODONE HCL 50 MG PO TABS
50.0000 mg | ORAL_TABLET | Freq: Every evening | ORAL | Status: DC | PRN
  Administered 2019-05-28 – 2019-05-31 (×4): 50 mg via ORAL
  Filled 2019-05-27 (×12): qty 1

## 2019-05-27 MED ORDER — NICOTINE 21 MG/24HR TD PT24
21.0000 mg | MEDICATED_PATCH | Freq: Every day | TRANSDERMAL | Status: DC
  Filled 2019-05-27 (×6): qty 1

## 2019-05-27 MED ORDER — STERILE WATER FOR INJECTION IJ SOLN
INTRAMUSCULAR | Status: AC
  Administered 2019-05-27: 10 mL
  Filled 2019-05-27: qty 10

## 2019-05-27 MED ORDER — AMLODIPINE BESYLATE 5 MG PO TABS
5.0000 mg | ORAL_TABLET | Freq: Every day | ORAL | Status: DC
  Administered 2019-05-28: 5 mg via ORAL
  Filled 2019-05-27 (×2): qty 1

## 2019-05-27 MED ORDER — ASPIRIN 81 MG PO CHEW
81.0000 mg | CHEWABLE_TABLET | Freq: Every day | ORAL | Status: DC
  Administered 2019-05-28 – 2019-06-01 (×5): 81 mg via ORAL
  Filled 2019-05-27 (×6): qty 1

## 2019-05-27 MED ORDER — HYDROXYZINE HCL 25 MG PO TABS: 25.0000 mg | ORAL_TABLET | Freq: Three times a day (TID) | ORAL | Status: DC | PRN

## 2019-05-27 MED ORDER — SENNOSIDES-DOCUSATE SODIUM 8.6-50 MG PO TABS: 2.0000 | ORAL_TABLET | Freq: Two times a day (BID) | ORAL | Status: DC | PRN

## 2019-05-27 MED ORDER — TAMSULOSIN HCL 0.4 MG PO CAPS
0.4000 mg | ORAL_CAPSULE | Freq: Every day | ORAL | Status: DC
  Administered 2019-05-28 – 2019-06-01 (×5): 0.4 mg via ORAL
  Filled 2019-05-27 (×6): qty 1

## 2019-05-27 MED ORDER — ATORVASTATIN CALCIUM 40 MG PO TABS
40.0000 mg | ORAL_TABLET | Freq: Every day | ORAL | Status: DC
  Administered 2019-05-28 – 2019-06-01 (×5): 40 mg via ORAL
  Filled 2019-05-27 (×6): qty 1

## 2019-05-27 MED ORDER — GABAPENTIN 300 MG PO CAPS
300.0000 mg | ORAL_CAPSULE | Freq: Two times a day (BID) | ORAL | Status: DC
  Administered 2019-05-27 – 2019-06-01 (×10): 300 mg via ORAL
  Filled 2019-05-27 (×13): qty 1

## 2019-05-27 MED ORDER — TICAGRELOR 90 MG PO TABS
90.0000 mg | ORAL_TABLET | Freq: Two times a day (BID) | ORAL | Status: DC
  Administered 2019-05-27 – 2019-06-01 (×10): 90 mg via ORAL
  Filled 2019-05-27 (×13): qty 1

## 2019-05-27 MED ORDER — LOSARTAN POTASSIUM 25 MG PO TABS
25.0000 mg | ORAL_TABLET | Freq: Every day | ORAL | Status: DC
  Administered 2019-05-28: 25 mg via ORAL
  Filled 2019-05-27 (×2): qty 1

## 2019-05-27 NOTE — Progress Notes (Signed)
D: Pt denies SI/HI/AVH. Pt is pleasant and cooperative. Pt disorganized on the unit , but pleasant this evening. Pt visible interacting with peers.   A: Pt was offered support and encouragement. Pt was encourage to attend groups. Q 15 minute checks were done for safety.   R: safety maintained on unit.

## 2019-05-27 NOTE — ED Notes (Signed)
Transported to [facility] by [employer]. All belongings given to [employer]. Pt was irritable about going, but went.

## 2019-05-27 NOTE — Discharge Summary (Addendum)
  Patient to be transferred [facility] for inpatient psychiatric.    Shuvon B. Rankin, NP  Patient's chart reviewed. Reviewed the information documented and agree with the treatment plan.  Buford Dresser, DO 05/27/19 4:14 PM

## 2019-05-27 NOTE — BH Assessment (Signed)
Tele Assessment Note   Patient Name: Bryan EnglishRoger D Davies MRN: [id number] Referring Physician: Kirtland BouchardK Ward, DO [facility] Location of Provider: Behavioral Health TTS Department  Bryan EnglishRoger D Viglione is a 68 y.o. male [facility] the urging of his fiancee Alcario Drought(Erica -- [phone]) due to increased paranoia and bizarre behavior.  Pt is now under IVC (petitioner is K. Ward, DO).  Pt was last assessed by TTS on 05/03/2019.  At that time, he came to the ED under IVC due to suicidal ideation and paranoia (stating that people are freezing his bank account)  He was treated at [facility].  Pt lives with his fiancee, and he works as a Music therapistcarpenter.  Pt stated that he does not know why he is at the hospital.  He denied suicidal ideation, homicidal ideation, hallucination, and paranoia.  He stated that he wanted to go home so he could take some medication.  Per notes, Pt is not compliant with his psychotropic medication, and he does not have a psychiatrist.  The attending physician reached Pt's fiancee, and she provided further history.  Per fiancee, Pt is increasingly paranoid -- he has stated that people are following him and trying to kill him, that people are threatening him on the phone.  Steffanie RainwaterFiancee also stated that Pt appeared to respond to auditory hallucination, and that he communicated a desire to commit suicide by police.  Per fiancee, Pt also paces in the street in the middle of the night and knocks on the doors of neighbors.  During assessment, Pt presented as alert and oriented to time and place, but not situation (''I don't know why I'm here'').  He had good eye contact.  Pt's demeanor was suspicious.  Pt's mood was irritable.  Affect was mood congruent.  Pt's speech was slurred.  Thought processes were within normal range.  Thought content could not be adequately assessed -- Pt denied hallucination and delusion, but per report, he frequently endorses such.  Concentration was good.  Memory could  not be adequately assessed.  Pt's impulse control and insight were poor.  Judgment was fair.  Consulted with Narda AmberJ. Norman, DO, who determined that Pt meets inpatient criteria -- 500 hall.  Diagnosis: Bipolar I Disorder, manic  Past Medical History:  Past Medical History:  Diagnosis Date  . Anxiety   . Hypertension   . Shingles 2013  . Shoulder pain     Past Surgical History:  Procedure Laterality Date  . IR ANGIO VERTEBRAL SEL SUBCLAVIAN INNOMINATE UNI R MOD SED  01/16/2019  . IR CT HEAD LTD  01/16/2019  . IR INTRAVSC STENT CERV CAROTID W/O EMB-PROT MOD SED INC ANGIO  01/16/2019  . IR PERCUTANEOUS ART THROMBECTOMY/INFUSION INTRACRANIAL INC DIAG ANGIO  01/16/2019  . RADIOLOGY WITH ANESTHESIA N/A 01/15/2019   Procedure: IR WITH ANESTHESIA;  Surgeon: Julieanne Cottoneveshwar, Sanjeev, MD;  Location: [facility] OR;  Service: Radiology;  Laterality: N/A;    Family History: No family history on file.  Social History:  reports that he has been smoking cigarettes. He has a 50.00 pack-year smoking history. He has never used smokeless tobacco. He reports that he does not drink alcohol or use drugs.  Additional Social History:  Alcohol / Drug Use Pain Medications: See MAR Prescriptions: See MAR Over the Counter: See MAR History of alcohol / drug use?: No history of alcohol / drug abuse  CIWA: CIWA-Ar BP: (!) 151/77 Pulse Rate: 98 COWS:    Allergies:  Allergies  Allergen Reactions  .  Pollen Extract Other (See Comments)    Sneezing     Home Medications: (Not in a hospital admission)   OB/GYN Status:  No LMP for male patient.  General Assessment Data Location of Assessment: [facility] ED TTS Assessment: In system Is this a Tele or Face-to-Face Assessment?: Tele Assessment Is this an Initial Assessment or a Re-assessment for this encounter?: Initial Assessment Patient Accompanied by:: N/A Language Other than Davies: No Living Arrangements: Other (Comment) What gender do you identify as?: Male Marital status:  Single Pregnancy Status: No Living Arrangements: Spouse/significant other Can pt return to current living arrangement?: Yes Admission Status: Involuntary Petitioner: ED Attending Is patient capable of signing voluntary admission?: Yes Referral Source: Self/Family/Friend Insurance type: [facility] Medicare     Crisis Care Plan Living Arrangements: Spouse/significant other Name of Psychiatrist: None indicated Name of Therapist: None indicated  Education Status Is patient currently in school?: No Is the patient employed, unemployed or receiving disability?: 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 : No Suicidal Intent: No Has patient had any suicidal intent within the past 6 months prior to admission? : No Is patient at risk for suicide?: Yes Suicidal Plan?: No Has patient had any suicidal plan within the past 6 months prior to admission? : No Access to Means: No What has been your use of drugs/alcohol within the last 12 months?: None indicated Previous Attempts/Gestures: No Other Self Harm Risks: Non compliant w/meds Family Suicide History: Unknown Recent stressful life event(s): Other (Comment)(Appears to be in manic phase) Persecutory voices/beliefs?: No(Pt denied -- see notes) Depression: Yes Depression Symptoms: Feeling worthless/self pity, Loss of interest in usual pleasures Substance abuse history and/or treatment for substance abuse?: No Suicide prevention information given to non-admitted patients: Not applicable  Risk to Others within the past 6 months Homicidal Ideation: No Does patient have any lifetime risk of violence toward others beyond the six months prior to admission? : No Thoughts of Harm to Others: No Current Homicidal Intent: No Current Homicidal Plan: No Access to Homicidal  Means: No History of harm to others?: No Assessment of Violence: None Noted Does patient have access to weapons?: No Criminal Charges Pending?: No Does patient have a court date: No Is patient on probation?: No  Psychosis Hallucinations: Auditory(Pt denies-- see notes) Delusions: Persecutory(Pt denies -- see notes)  Mental Status Report Appearance/Hygiene: In scrubs, Unremarkable Eye Contact: Good Motor Activity: Freedom of movement Speech: Slurred Level of Consciousness: Alert, Irritable Mood: Suspicious, Irritable Affect: Appropriate to circumstance Anxiety Level: None Judgement: Partial Orientation: Person, Place, Time Obsessive Compulsive Thoughts/Behaviors: None  Cognitive Functioning Concentration: Good Memory: Unable to Assess Is patient IDD: No Insight: Poor Impulse Control: Poor  ADLScreening [facility] Assessment Services) Patient's cognitive ability adequate to safely complete daily activities?: Yes Patient able to express need for assistance with ADLs?: Yes Independently performs ADLs?: Yes (appropriate for developmental age)  Prior Inpatient Therapy Prior Inpatient Therapy: Yes Prior Therapy Dates: 2020, 1997 Prior Therapy Facilty/Provider(s): [facility] and other Reason for Treatment: Altered mental state, paranoia  Prior Outpatient Therapy Prior Outpatient Therapy: No Does patient have an ACCT team?: No Does patient have Intensive In-House Services?  : No Does patient have [facility] services? : No Does patient have P4CC services?: No  ADL Screening (condition at time of admission) Patient's cognitive ability adequate to safely  complete daily activities?: Yes Is the patient deaf or have difficulty hearing?: No Does the patient have difficulty seeing, even when wearing glasses/contacts?: No Does the patient have difficulty concentrating, remembering, or making decisions?: No Patient able to express need for assistance with ADLs?: Yes Does the patient  have difficulty dressing or bathing?: No Independently performs ADLs?: Yes (appropriate for developmental age) Does the patient have difficulty walking or climbing stairs?: No Weakness of Legs: None Weakness of Arms/Hands: None  Home Assistive Devices/Equipment Home Assistive Devices/Equipment: None  Therapy Consults (therapy consults require a physician order) PT Evaluation Needed: No OT Evalulation Needed: No SLP Evaluation Needed: No Abuse/Neglect Assessment (Assessment to be complete while patient is alone) Abuse/Neglect Assessment Can Be Completed: Unable to assess, patient is non-responsive or altered mental status Values / Beliefs Cultural Requests During Hospitalization: None Spiritual Requests During Hospitalization: None Consults Spiritual Care Consult Needed: No Social Work Consult Needed: No Merchant navy officerAdvance Directives (For Healthcare) Does Patient Have a Medical Advance Directive?: No Would patient like information on creating a medical advance directive?: No - Patient declined          Disposition:  Disposition Initial Assessment Completed for this Encounter: Yes  This service was provided via telemedicine using a 2-way, interactive audio and video technology.  Names of all persons participating in this telemedicine service and their role in this encounter. Name: Linford ArnoldWelch, Damarian Role: Pt             Earline Mayotteugene T Lovelee Forner 05/27/2019 8:42 AM

## 2019-05-27 NOTE — ED Notes (Signed)
Report called to John at [facility]. [employer] called for transport.

## 2019-05-27 NOTE — Progress Notes (Signed)
D: Patient arrived to [facility] Adult unit from [facility] IVC. He is disoriented to place, time, and situation. Most of the assessment was drawn from nursing report and behavior. He refused most care and all assessment questions. He refused to sign most of the paperwork. During admission, he was fearful that I was trying to kill him when I went to cut his old armband off. I was not able to remove his old armband. He is notably paranoid and does not want anyone to know he is here or be involved in his care, including his fiance. He is a 68yo patient found wandering in a parking lot at night. He lives with his fiance and she was concerned he was becoming paranoid. He talks to himself and his speech is significantly disorganized, almost word salad. He has a hx of BP disorder and has been off his meds. UDS negative and alcohol negative. A: Skin assessment performed per protocol, no contraband noted, and was unremarkable. Patient belongings placed in locker, but patient refused to sign for them. Unit orientation completed. Care plan and unit routines reviewed with patient, understanding verbalized. Emotional support offered to patient. Encouraged patient to voice concerns. Fluids offered to patient. Q15 minute checks initiated for safety on and off unit.  R: Patient pacing hall on the unit.

## 2019-05-27 NOTE — BH Assessment (Signed)
Clinician contacted pt's nurse, Latricia RN, to determine if pt was coherent enough to complete his BH Assessment. Pt's nurse and his nurse tech, Buyer, retail, determined that pt was still incoherent and would not be able to complete his assessment; they recommended trying again closer to breakfast time (0700 - 0730), which might assist in waking pt up and also putting him in a good mood. Clinician will relay this information to the next shift.

## 2019-05-27 NOTE — Tx Team (Signed)
Initial Treatment Plan 05/27/2019 5:01 PM KARTEL WOLBERT OFB:[id number]    PATIENT STRESSORS: Other: off medications   PATIENT STRENGTHS: Motivation for treatment/growth Supportive family/friends   PATIENT IDENTIFIED PROBLEMS: 1. Patient refused to answer his reason for admission and is disoriented. He set no goals.                     DISCHARGE CRITERIA:  Improved stabilization in mood, thinking, and/or behavior  PRELIMINARY DISCHARGE PLAN: Outpatient therapy  PATIENT/FAMILY INVOLVEMENT: This treatment plan has been presented to and reviewed with the patient, ALEXANDR YAWORSKI.  The patient has been given the opportunity to ask questions and make suggestions.  Lesli Albee, RN 05/27/2019, 5:01 PM

## 2019-05-27 NOTE — BH Assessment (Signed)
[facility] Assessment Progress Note  Per Buford Dresser, DO, this pt requires psychiatric hospitalization.  xxx, RN, AC has assigned pt to [facility] Rm [id number]; [facility] will be ready to receive pt between 12:30 and 13:00.  Pt presents under IVC initiated by Mier, DO, and IVC documents have been faxed to [facility].  Pt's nurse, Diane, has been notified, and agrees to call report to [phone].  Pt is to be transported via Event [facility].   Jalene Mullet, [place] Coordinator [phone]

## 2019-05-27 NOTE — ED Notes (Signed)
Pt agitated, Geodon ordered.  Given with Security on standby.  Pt tolerated well.

## 2019-05-28 ENCOUNTER — Telehealth (HOSPITAL_COMMUNITY): Payer: Self-pay

## 2019-05-28 MED ORDER — AMLODIPINE BESYLATE 10 MG PO TABS
10.0000 mg | ORAL_TABLET | Freq: Every day | ORAL | Status: DC
  Administered 2019-05-29 – 2019-06-01 (×4): 10 mg via ORAL
  Filled 2019-05-28: qty 2
  Filled 2019-05-28 (×5): qty 1

## 2019-05-28 MED ORDER — LOSARTAN POTASSIUM 50 MG PO TABS
50.0000 mg | ORAL_TABLET | Freq: Every day | ORAL | Status: DC
  Administered 2019-05-29 – 2019-06-01 (×4): 50 mg via ORAL
  Filled 2019-05-28 (×5): qty 1

## 2019-05-28 NOTE — Progress Notes (Signed)
The focus of this group is to help patients establish daily goals to achieve during treatment and discuss how the patient can incorporate goal setting into their daily lives to aide in recovery. 

## 2019-05-28 NOTE — Progress Notes (Signed)
Patient ID: Bryan Davies, male   DOB: 08/17/1951, 68 y.o.   MRN: [id number]  Nursing Progress Note [id number]  Patient presents disorganized and remains delusional stating his fiance is lying about his need to be hospitalized. Patient compliant with scheduled medications. Patient is seen attending groups and visible in the milieu. Patient with labile mood but is in generally good/silly spirits. Patient currently denies SI/HI/AVH.   Patient safety maintained with q15 min safety checks.   Patient remains safe on the unit at this time. Will continue to support and monitor with plan of care.   Patient's self-inventory sheet Rated Energy Level  Normal  Rated Sleep    Rated Appetite    Rated Anxiety (0-10)  0  Rated Hopelessness (0-10)  0  Rated Depression (0-10)  0  Daily Goal  "get out of here"  Any Additional Comments:

## 2019-05-28 NOTE — BHH Counselor (Signed)
Adult Comprehensive Assessment  Patient ID: Bryan Davies, male   DOB: May 07, 1951, 68 y.o.   MRN: [id number]  Information Source: Information source: Patient  Current Stressors:  Patient states their primary concerns and needs for treatment are:: "I come on my own. Find out what was wrong" Patient states their goals for this hospitilization and ongoing recovery are:: "Show my girlfriend that nothing is wrong with me" Educational / Learning stressors: Pt denies stressors Employment / Job issues: Pt reports that he is on [employer] security Family Relationships: Pt reports conflict with his brother. Financial / Lack of resources (include bankruptcy): Pt reports getting social security. Housing / Lack of housing: Pt reports having his own apartment but if things don't work out with his girlfriend then he is going to [place]. Physical health (include injuries & life threatening diseases): Pt denies stressorrs. Social relationships: Pt reports some conflicts with his girlfriend Substance abuse: Pt denies stressors. Bereavement / Loss: Pt denies stressors  Living/Environment/Situation:  Living Arrangements: Alone Living conditions (as described by patient or guardian): "I love it" Who else lives in the home?: Alone How long has patient lived in current situation?: 15 years What is atmosphere in current home: Comfortable  Family History:  Marital status: Long term relationship Long term relationship, how long?: 1 year What types of issues is patient dealing with in the relationship?: Pt denies issues except that his girlfriend thinks that something is wrong with him. Are you sexually active?: Yes What is your sexual orientation?: Heterosexual Has your sexual activity been affected by drugs, alcohol, medication, or emotional stress?: No Does patient have children?: Yes How many children?: 1(daughter) How is patient's relationship with their children?: "She doesn't know I am her  daddy"  Childhood History:  By whom was/is the patient raised?: Both parents Description of patient's relationship with caregiver when they were a child: "Great" Patient's description of current relationship with people who raised him/her: Patient reports his parents are deceased How were you disciplined when you got in trouble as a child/adolescent?: Whoopings Does patient have siblings?: Yes Number of Siblings: 4(3 brothers and 1 sister) Description of patient's current relationship with siblings: "I want nothing to do with them" Did patient suffer any verbal/emotional/physical/sexual abuse as a child?: No Did patient suffer from severe childhood neglect?: No Has patient ever been sexually abused/assaulted/raped as an adolescent or adult?: No Was the patient ever a victim of a crime or a disaster?: No Witnessed domestic violence?: No Has patient been effected by domestic violence as an adult?: No  Education:  Highest grade of school patient has completed: 12th grade Currently a student?: No Learning disability?: No  Employment/Work Situation:   Employment situation: Retired Psychologist, clinicalatient's job has been impacted by current illness: No What is the longest time patient has a held a job?: 31 years Where was the patient employed at that time?: [employer] Did You Receive Any Psychiatric Treatment/Services While in the [employer]?: No Are There Guns or Other Weapons in Your Home?: No  Financial Resources:   Surveyor, quantityinancial resources: [employer]icare, Actoreceives SSI Does patient have a Lawyerrepresentative payee or guardian?: No  Alcohol/Substance Abuse:   What has been your use of drugs/alcohol within the last 12 months?: Patient denies substance use If attempted suicide, did drugs/alcohol play a role in this?: No Alcohol/Substance Abuse Treatment Hx: Denies past history Has alcohol/substance abuse ever caused legal problems?: No  Social Support System:   Patient's Community Support System: Good Describe  Community Support System: Girlfriend Type of  faith/religion: "Believes in [place]" How does patient's faith help to cope with current illness?: "God helps me"  Leisure/Recreation:   Leisure and Hobbies: Swimming, fishing, and drive his truck  Strengths/Needs:   What is the patient's perception of their strengths?: Driving Patient states they can use these personal strengths during their treatment to contribute to their recovery: Pt could not answer Patient states these barriers may affect/interfere with their treatment: N/A Patient states these barriers may affect their return to the community: "Girlfriend" Other important information patient would like considered in planning for their treatment: N/A  Discharge Plan:   Currently receiving community mental health services: No Patient states concerns and preferences for aftercare planning are: [facility] Patient states they will know when they are safe and ready for discharge when: "Anytime I can go out of the door" Does patient have access to transportation?: Yes(car is at [facility] ED) Does patient have financial barriers related to discharge medications?: No Will patient be returning to same living situation after discharge?: Yes(home)  Summary/Recommendations:   Summary and Recommendations (to be completed by the evaluator): Pt is a 68 year old who came to the ED under IVC due to suicidal ideation and paranoia (stating that people are freezing his bank account). Pt's diagnosis is: Bipolar 1 disorder ([place]). Recommendations for pt include: crisis stabilization, therapeutic milieu, medication management, attend and participate in group therapy, and development of a comprehensive mental wellness plan.  Trecia Rogers. 05/28/2019

## 2019-05-28 NOTE — H&P (Signed)
BH Observation Unit Provider Admission PAA/H&P  Patient Identification: Bryan Davies MRN:  [id number] Date of Evaluation:  05/28/2019 Chief Complaint:  Bipolar 1 Disorder; Manic Principal Diagnosis: Exacerbation of underlying bipolar condition Diagnosis:  Active Problems:   Bipolar 1 disorder (HCC)  History of Present Illness:   The patient brought himself voluntarily to the hospital at his fiance's request. Upon talking to him, he was very angry and stated that he doesn't need any help and he only came to get "her off his [explicit] back." He endorses that she is the one that needs help, not him. His speech is extremely slurred and he has loosening of associations. At one point, he does mention that "someone is trying to kill him." When he is redirected, he gets angry and states that "he won't let anyone hurt him."  He endorses that he has a history of bipolar. He was treated with valproic acid and one other medication, but he could not afford the medication. He states that he has not been taking medications nor seeing a psychiatrist for the past 4-5 years.   He is alert and oriented to self but not to situation. Patient could not be redirected to answer questions about self harm or harm to others.   According to the assessment team evaluation of 7/29 Bryan EnglishRoger D Blatz is a 68 y.o. male who [facility] at the urging of his fiancee Alcario Drought(Erica -- [phone]) due to increased paranoia and bizarre behavior.  Pt is now under IVC (petitioner is K. Ward, DO).  Pt was last assessed by TTS on 05/03/2019.  At that time, he came to the ED under IVC due to suicidal ideation and paranoia (stating that people are freezing his bank account)  [facility].  Pt lives with his fiancee, and he works as a Music therapistcarpenter.  Pt stated that he does not know why he is at the hospital.  He denied suicidal ideation, homicidal ideation, hallucination, and paranoia.  He stated that he wanted to go home so he  could take some medication.  Per notes, Pt is not compliant with his psychotropic medication, and he does not have a psychiatrist.  The attending physician reached Pt's fiancee, and she provided further history.  Per fiancee, Pt is increasingly paranoid -- he has stated that people are following him and trying to kill him, that people are threatening him on the phone.  Steffanie RainwaterFiancee also stated that Pt appeared to respond to auditory hallucination, and that he communicated a desire to commit suicide by police.  Per fiancee, Pt also paces in the street in the middle of the night and knocks on the doors of neighbors.  During assessment, Pt presented as alert and oriented to time and place, but not situation (''I don't know why I'm here'').  He had good eye contact.  Pt's demeanor was suspicious.  Pt's mood was irritable.  Affect was mood congruent.  Pt's speech was slurred.  Thought processes were within normal range.  Thought content could not be adequately assessed -- Pt denied hallucination and delu sion, but per report, he frequently endorses such.  Concentration was good.  Memory could not be adequately assessed.  Pt's impulse control and insight were poor.  Judgment was fair.  Consulted with Narda AmberJ. Norman, DO, who determined that Pt meets inpatient criteria -- 500 hall.    Associated Signs/Symptoms: Depression Symptoms:  psychomotor agitation, (Hypo) Manic Symptoms:  Flight of Ideas, Anxiety Symptoms:  Social Anxiety, Psychotic  Symptoms:  Paranoia, PTSD Symptoms: NA Total Time spent with patient: 45 minutes  Past Psychiatric History: exstensive  Is the patient at risk to self? Yes.    Has the patient been a risk to self in the past 6 months? No.  Has the patient been a risk to self within the distant past? Yes.    Is the patient a risk to others? No.  Has the patient been a risk to others in the past 6 months? No.  Has the patient been a risk to others within the distant past? No.   Alcohol  Screening: Patient refused Alcohol Screening Tool: Yes Substance Abuse History in the last 12 months:  No. Consequences of Substance Abuse: NA Previous Psychotropic Medications: Yes  Psychological Evaluations: No  Past Medical History:  Past Medical History:  Diagnosis Date  . Anxiety   . Hypertension   . Shingles 2013  . Shoulder pain     Past Surgical History:  Procedure Laterality Date  . IR ANGIO VERTEBRAL SEL SUBCLAVIAN INNOMINATE UNI R MOD SED  01/16/2019  . IR CT HEAD LTD  01/16/2019  . IR INTRAVSC STENT CERV CAROTID W/O EMB-PROT MOD SED INC ANGIO  01/16/2019  . IR PERCUTANEOUS ART THROMBECTOMY/INFUSION INTRACRANIAL INC DIAG ANGIO  01/16/2019  . RADIOLOGY WITH ANESTHESIA N/A 01/15/2019   Procedure: IR WITH ANESTHESIA;  Surgeon: Julieanne Cotton, MD;  Location: [facility] OR;  Service: Radiology;  Laterality: N/A;   Family History:  Could not elaborate  Family Psychiatric History:  Could not elaborate  Tobacco Screening: Have you used any form of tobacco in the last 30 days? (Cigarettes, Smokeless Tobacco, Cigars, and/or Pipes): Yes Tobacco use, Select all that apply: 5 or more cigarettes per day Are you interested in Tobacco Cessation Medications?: No, patient refused Counseled patient on smoking cessation including recognizing danger situations, developing coping skills and basic information about quitting provided: Refused/Declined practical counseling Social History:   Tobacco - 1 ppd x 56 years EtOH - denies Illicits - denies   Additional Social History:  Patient currently has a fiance. He has not lived with her for the past two months. 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   Allergies:   Allergies  Allergen Reactions  . Pollen Extract Other (See Comments)    Sneezing    Lab Results:  Results for orders placed or performed during the hospital encounter of 05/26/19 (from the past 48 hour(s))  Comprehensive metabolic panel      Status: Abnormal   Collection Time: 05/26/19  8:24 PM  Result Value Ref Range   Sodium 136 135 - 145 mmol/L   Potassium 4.2 3.5 - 5.1 mmol/L   Chloride 103 98 - 111 mmol/L   CO2 23 22 - 32 mmol/L   Glucose, Bld 85 70 - 99 mg/dL   BUN 17 8 - 23 mg/dL   Creatinine, Ser 1.61 0.61 - 1.24 mg/dL   Calcium 8.5 (L) 8.9 - 10.3 mg/dL   Total Protein 7.3 6.5 - 8.1 g/dL   Albumin 3.8 3.5 - 5.0 g/dL   AST 22 15 - 41 U/L   ALT 16 0 - 44 U/L   Alkaline Phosphatase 90 38 - 126 U/L   Total Bilirubin 0.6 0.3 - 1.2 mg/dL   GFR calc non Af Amer >60 >60 mL/min   GFR calc Af Amer >60 >60 mL/min   Anion gap 10 5 - 15    Comment: Performed at [employer] [facility], [address] [place], [place] [address]  Ethanol     Status: None   Collection Time: 05/26/19  8:24 PM  Result Value Ref Range   Alcohol, Ethyl (B) <10 <10 mg/dL    Comment: (NOTE) Lowest detectable limit for serum alcohol is 10 mg/dL. For medical purposes only. Performed at [facility], [address] [place], [place] [address]   Salicylate level     Status: None   Collection Time: 05/26/19  8:24 PM  Result Value Ref Range   Salicylate Lvl <7.0 2.8 - 30.0 mg/dL    Comment: Performed at [facility], [address] [place], [place] [address]  Acetaminophen level     Status: Abnormal   Collection Time: 05/26/19  8:24 PM  Result Value Ref Range   Acetaminophen (Tylenol), Serum <10 (L) 10 - 30 ug/mL    Comment: (NOTE) Therapeutic concentrations vary significantly. A range of 10-30 ug/mL  may be an effective concentration for many patients. However, some  are best treated at concentrations outside of this range. Acetaminophen concentrations >150 ug/mL at 4 hours after ingestion  and >50 ug/mL at 12 hours after ingestion are often associated with  toxic reactions. Performed at [facility], [address] [place], [place] [address]   cbc     Status: None    Collection Time: 05/26/19  8:24 PM  Result Value Ref Range   WBC 8.8 4.0 - 10.5 K/uL   RBC 4.77 4.22 - 5.81 MIL/uL   Hemoglobin 14.1 13.0 - 17.0 g/dL   HCT 29.543.1 62.139.0 - 30.852.0 %   MCV 90.4 80.0 - 100.0 fL   MCH 29.6 26.0 - 34.0 pg   MCHC 32.7 30.0 - 36.0 g/dL   RDW 65.712.8 84.611.5 - 96.215.5 %   Platelets 261 150 - 400 K/uL   nRBC 0.0 0.0 - 0.2 %    Comment: Performed at [facility], [address] [place], [place] [address]  Rapid urine drug screen (hospital performed)     Status: None   Collection Time: 05/26/19  8:24 PM  Result Value Ref Range   Opiates NONE DETECTED NONE DETECTED   Cocaine NONE DETECTED NONE DETECTED   Benzodiazepines NONE DETECTED NONE DETECTED   Amphetamines NONE DETECTED NONE DETECTED   Tetrahydrocannabinol NONE DETECTED NONE DETECTED   Barbiturates NONE DETECTED NONE DETECTED    Comment: (NOTE) DRUG SCREEN FOR MEDICAL PURPOSES ONLY.  IF CONFIRMATION IS NEEDED FOR ANY PURPOSE, NOTIFY LAB WITHIN 5 DAYS. LOWEST DETECTABLE LIMITS FOR URINE DRUG SCREEN Drug Class                     Cutoff (ng/mL) Amphetamine and metabolites    1000 Barbiturate and metabolites    200 Benzodiazepine                 200 Tricyclics and metabolites     300 Opiates and metabolites        300 Cocaine and metabolites        300 THC                            50 Performed at [facility], [address] [place], [place] [address]     Blood Alcohol level:  Lab Results  Component Value Date   [facility] 05/26/2019   ETH <10 05/02/2019    Metabolic Disorder  Labs:  Lab Results  Component Value Date   HGBA1C 6.6 (H) 01/16/2019   MPG 142.72 01/16/2019   No results found for: PROLACTIN Lab Results  Component Value Date   CHOL 152 01/16/2019   TRIG 107 01/16/2019   HDL 35 (L) 01/16/2019   CHOLHDL 4.3 01/16/2019   VLDL 21 01/16/2019   LDLCALC 96 01/16/2019   LDLCALC 96 12/05/2015    Current Medications: Current Facility-Administered  Medications  Medication Dose Route Frequency Provider Last Rate Last Dose  . amLODipine (NORVASC) tablet 5 mg  5 mg Oral Daily Rankin, Shuvon B, NP   5 mg at 05/28/19 0830  . aspirin chewable tablet 81 mg  81 mg Oral Daily Rankin, Shuvon B, NP   81 mg at 05/28/19 0830  . atorvastatin (LIPITOR) tablet 40 mg  40 mg Oral Daily Rankin, Shuvon B, NP   40 mg at 05/28/19 0830  . gabapentin (NEURONTIN) capsule 300 mg  300 mg Oral BID Rankin, Shuvon B, NP   300 mg at 05/28/19 2542  . hydrOXYzine (ATARAX/VISTARIL) tablet 25 mg  25 mg Oral TID PRN Rozetta Nunnery, NP      . losartan (COZAAR) tablet 25 mg  25 mg Oral Daily Rankin, Shuvon B, NP   25 mg at 05/28/19 0830  . nicotine (NICODERM CQ - dosed in mg/24 hours) patch 21 mg  21 mg Transdermal Daily Rankin, Shuvon B, NP      . senna-docusate (Senokot-S) tablet 2 tablet  2 tablet Oral BID PRN Rankin, Shuvon B, NP      . tamsulosin (FLOMAX) capsule 0.4 mg  0.4 mg Oral Daily Rankin, Shuvon B, NP   0.4 mg at 05/28/19 0830  . ticagrelor (BRILINTA) tablet 90 mg  90 mg Oral BID Rankin, Shuvon B, NP   90 mg at 05/28/19 0829  . traZODone (DESYREL) tablet 50 mg  50 mg Oral QHS,MR X 1 Lindon Romp A, NP       PTA Medications: Medications Prior to Admission  Medication Sig Dispense Refill Last Dose  . acetaminophen (TYLENOL) 500 MG tablet Take 500-1,000 mg by mouth every 6 (six) hours as needed for mild pain, moderate pain or headache.      Marland Kitchen amLODipine (NORVASC) 5 MG tablet Take 1 tablet (5 mg total) by mouth daily. 90 tablet 0   . aspirin 81 MG chewable tablet Chew 1 tablet (81 mg total) by mouth daily.     Marland Kitchen atorvastatin (LIPITOR) 40 MG tablet Take 1 tablet (40 mg total) by mouth daily. 30 tablet 11   . cyclobenzaprine (FLEXERIL) 10 MG tablet Take 1 tablet (10 mg total) by mouth 2 (two) times daily as needed for muscle spasms. (Patient not taking: Reported on 05/04/2019) 20 tablet 0   . gabapentin (NEURONTIN) 300 MG capsule Take 1 capsule (300 mg total) by mouth 2  (two) times daily. 60 capsule 11   . losartan (COZAAR) 25 MG tablet Take 25 mg by mouth daily.     . nicotine (NICODERM CQ - DOSED IN MG/24 HOURS) 14 mg/24hr patch Place 1 patch (14 mg total) onto the skin daily. (Patient not taking: Reported on 05/04/2019) 28 patch 0   . sennosides-docusate sodium (SENOKOT-S) 8.6-50 MG tablet Take 2 tablets by mouth daily as needed for constipation.     . tamsulosin (FLOMAX) 0.4 MG CAPS capsule Take 0.4 mg by mouth daily.     . ticagrelor (BRILINTA) 90 MG TABS tablet Take 1 tablet (90 mg total)  by mouth 2 (two) times daily. 60 tablet 11     Musculoskeletal: Strength & Muscle Tone: within normal limits Gait & Station: normal Patient leans: N/A  Psychiatric Specialty Exam: Physical Exam  Constitutional: He appears well-developed.  HENT:  Head: Atraumatic.  Respiratory: Effort normal.    Review of Systems  Constitutional: Negative.   Respiratory: Positive for cough.   Cardiovascular: Negative.   Gastrointestinal: Negative.     Blood pressure (!) 154/77, pulse 94, temperature 97.8 F (36.6 C), temperature source Oral, resp. rate 16, SpO2 99 %.There is no height or weight on file to calculate BMI.  General Appearance: Disheveled and poorly groomed  Eye Contact:  Poor  Speech:  Garbled and Pressured  Volume:  Increased  Mood:  Angry  Affect:  Congruent and Full Range  Thought Process:  Disorganized and Descriptions of Associations: Loose  Orientation:  Other:  Alert to self and location, not to situation  Thought Content:  Paranoid Ideation and Tangential Thoughts of someone trying to kill him  Suicidal Thoughts:  Could not elaborate  Homicidal Thoughts:  Could not elaborate  Memory:  Immediate;   Good  Judgement:  Poor  Insight:  Lacking  Psychomotor Activity:  Normal  Concentration:  Concentration: Poor and Attention Span: Poor  Recall:  Fair  Fund of Knowledge:  Poor  Language:  Fair  Akathisia:  No  Handed:  Right  AIMS (if indicated):      Assets:  Health and safety inspectorinancial Resources/Insurance Housing  ADL's:  Intact  Cognition:  Impaired  Sleep:  Number of Hours: 6      Treatment Plan Summary: Daily contact with patient to assess and evaluate symptoms and progress in treatment  Observation Level/Precautions:  15 minute checks Laboratory:  HCG Psychotherapy:   Medications:   Consultations:   Discharge Concerns:   Estimated LOS: Other:      Beverly GustElisa B Hannan, Medical Student 7/30/20209:15 AM

## 2019-05-28 NOTE — Progress Notes (Signed)
Recreation Therapy Notes  Date: 7.30.20 Time: 1000 Location: [address] Hall Day Room  Group Topic: Leisure Education, Biomedical scientist) Addresses:  Patient will identify positive leisure outlets. Patient will identify benefits of leisure.  Behavioral Response: Engaged  Intervention:  Markers, Electronics engineer, Web designer, Leisure Activities  Activity: [place].  Patient will pull a leisure activity from the container.  Patient will draw that activity on the board.  The remaining patients will attempt to guess what the picture is.  The person who guess the picture correctly, gets the next turn.  Education: Education officer, community, Environmental health practitioner, Discharge Planning    Education Outcome: Acknowledges education  Clinical Observations/Feedback: Pt was attentive and active during group.  Pt was hard to understand when speaking due to constant laughing in the middle of his sentences and just being hard to understand.  Pt was pleasant and engaged.    Victorino Sparrow, LRT/CTRS    Victorino Sparrow A 05/28/2019 12:15 PM

## 2019-05-28 NOTE — Telephone Encounter (Signed)
Called to schedule f/u, no answer, left vm. AW 
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
## 2019-05-28 NOTE — Plan of Care (Signed)
  Problem: Health Behavior/Discharge Planning: Goal: Compliance with prescribed medication regimen will improve Outcome: Progressing   Problem: Safety: Goal: Ability to remain free from injury will improve Outcome: Progressing

## 2019-05-28 NOTE — [facility] Group Notes (Signed)
[facility] LCSW Group Therapy Note  Date/Time: 05/28/2019 @ 1pm  Type of Therapy and Topic:  Group Therapy:  Overcoming Obstacles  Participation Level:  Active   Description of Group:    In this group patients will be encouraged to explore what they see as obstacles to their own wellness and recovery. They will be guided to discuss their thoughts, feelings, and behaviors related to these obstacles. The group will process together ways to cope with barriers, with attention given to specific choices patients can make. Each patient will be challenged to identify changes they are motivated to make in order to overcome their obstacles. This group will be process-oriented, with patients participating in exploration of their own experiences as well as giving and receiving support and challenge from other group members.  Therapeutic Goals: 1. Patient will identify personal and current obstacles as they relate to admission. 2. Patient will identify barriers that currently interfere with their wellness or overcoming obstacles.  3. Patient will identify feelings, thought process and behaviors related to these barriers. 4. Patient will identify two changes they are willing to make to overcome these obstacles:    Summary of Patient Progress   Patient was active and engaged throughout group today. Patient was able to identify his current obstacles which is to get out of the hospital. Patient was able to identify his barriers that currently interfere with overcoming his obstacles which is the inability to understand why he is in the hospital. Patient stated that he does not know why he is here but states that he has a car at the [facility] ED and he should be "at [facility]". Patient was able to identify changes that he is willing to make to overcome his obstacles which is taking his medications and watching who he associates/hangs out with.    Therapeutic Modalities:   Cognitive Behavioral Therapy Solution  Focused Therapy Motivational Interviewing Relapse Prevention Therapy   Ardelle Anton, LCSW

## 2019-05-29 MED ORDER — HALOPERIDOL 5 MG PO TABS
10.0000 mg | ORAL_TABLET | Freq: Every day | ORAL | Status: DC
  Administered 2019-05-29 – 2019-05-31 (×3): 10 mg via ORAL
  Filled 2019-05-29 (×5): qty 2

## 2019-05-29 MED ORDER — BENZTROPINE MESYLATE 0.5 MG PO TABS
0.5000 mg | ORAL_TABLET | Freq: Two times a day (BID) | ORAL | Status: DC
  Administered 2019-05-29 – 2019-05-30 (×3): 0.5 mg via ORAL
  Filled 2019-05-29 (×7): qty 1

## 2019-05-29 MED ORDER — HALOPERIDOL 5 MG PO TABS
5.0000 mg | ORAL_TABLET | Freq: Every day | ORAL | Status: DC
  Administered 2019-05-29 – 2019-06-01 (×4): 5 mg via ORAL
  Filled 2019-05-29 (×5): qty 1

## 2019-05-29 MED ORDER — TEMAZEPAM 30 MG PO CAPS
30.0000 mg | ORAL_CAPSULE | Freq: Every day | ORAL | Status: DC
  Administered 2019-05-29: 30 mg via ORAL
  Filled 2019-05-29: qty 1

## 2019-05-29 MED ORDER — DIVALPROEX SODIUM ER 250 MG PO TB24
750.0000 mg | ORAL_TABLET | Freq: Every day | ORAL | Status: DC
  Administered 2019-05-29 – 2019-05-31 (×3): 750 mg via ORAL
  Filled 2019-05-29 (×5): qty 3

## 2019-05-29 NOTE — Progress Notes (Signed)
Pt attended spirituality group facilitated by Simone Curia, MDIv, [place].  Group Description:  Group focused on topic of hope.  Patients participated in facilitated discussion around topic, connecting with one another around experiences and definitions for hope.  Group members engaged with visual explorer photos, reflecting on what hope looks like for them today.  Group engaged in discussion around how their definitions of hope are present today in hospital.   Modalities: Psycho-social ed, Adlerian, Narrative, MI

## 2019-05-29 NOTE — Progress Notes (Signed)
[place] NOVEL CORONAVIRUS (COVID-19) DAILY CHECK-OFF SYMPTOMS - answer yes or no to each - every day NO YES  Have you had a fever in the past 24 hours?  . Fever (Temp > 37.80C / 100F) X   Have you had any of these symptoms in the past 24 hours? . New Cough .  Sore Throat  .  Shortness of Breath .  Difficulty Breathing .  Unexplained Body Aches   X   Have you had any one of these symptoms in the past 24 hours not related to allergies?   . Runny Nose .  Nasal Congestion .  Sneezing   X   If you have had runny nose, nasal congestion, sneezing in the past 24 hours, has it worsened?  X   EXPOSURES - check yes or no X   Have you traveled outside the state in the past 14 days?  X   Have you been in contact with someone with a confirmed diagnosis of COVID-19 or PUI in the past 14 days without wearing appropriate PPE?  X   Have you been living in the same home as a person with confirmed diagnosis of COVID-19 or a PUI (household contact)?    X   Have you been diagnosed with COVID-19?    X              What to do next: Answered NO to all: Answered YES to anything:   Proceed with unit schedule Follow the [facility] Inpatient Flowsheet.   

## 2019-05-29 NOTE — Progress Notes (Signed)
Recreation Therapy Notes  Date: 7.31.20 Time: 1000 Location: 500 Hall Dayroom  Group Topic: Goal Setting  Goal Area(s) Addresses:  Patient will be able to identify at least 3 life goals.  Patient will be able to identify benefit of investing in life goals.  Patient will be able to identify benefit of setting life goals.   Behavioral Response:  None  Intervention: Worksheet  Activity: Goal Planning.  Patients were to set goals for the week, month, year and five years.  Patients were to also identify any obstacles preventing from reaching their goals, what they need to achieve their goals and what they can start doing now to work towards their goals.  Education:  Discharge Planning, Radiographer, therapeutic, Leisure Education   Education Outcome: Acknowledges Education/In Group Clarification Provided/Needs Additional Education  Clinical Observations: Pt sat and observed.Victorino Sparrow, LRT/CTRS         Victorino Sparrow A 05/29/2019 11:12 AM

## 2019-05-29 NOTE — Progress Notes (Signed)
[facility]  Patient Identification: Bryan Davies MRN:  [id number] Date of Evaluation:  05/29/2019 Chief Complaint:  Bipolar 1 Disorder; Manic Principal Diagnosis: Exacerbation of underlying bipolar condition Diagnosis:  Active Problems:   Bipolar 1 disorder (HCC)  Subjective:  The patient was found this morning kneeling and looking for lotion around his room. Of note, he remained kneeling and actively searching for the lotion throughout this interview. The patient states that he is "feeling just fine." He states that he wants to be discharged and go back to his own apartment. He states that it is hard for him to sleep here since some of the the other patients do not stop talking/singing. He states that he did talk to his fiance on the phone yesterday, but did not elaborate on how the conversation was. He states that he had to hang up on her because she "doesn't know what she's talking about."   When assessing his judgement, the patient was asked, "What would you do if you found an envelope that was addressed and had postage laying on the sidewalk."  He answered saying that he would flush the letter and destroy it, because someone is going to get hurt." When asked about thoughts of someone hurting him, he denied. He denies visual or auditory hallucinations. He denies thoughts of harming himself or others.   Associated Signs/Symptoms:  Depression Symptoms: psychomotor agitation,  (Hypo) Manic Symptoms: Flight of Ideas,  Anxiety Symptoms: Social Anxiety,  Psychotic Symptoms: Paranoia,  PTSD Symptoms:  NA  Total Time spent with patient: 15 minutes   Past Medical History:  - Bipolar Disorder - Anxiety - HTN - Shingles  Collateral information from fiance states that he recently had a stroke and did not pursue follow up care.   Medications: Scheduled Meds: . amLODipine  10 mg Oral Daily  . aspirin  81 mg Oral Daily  . atorvastatin  40 mg Oral Daily  . benztropine  0.5 mg Oral BID   . divalproex  750 mg Oral QHS  . gabapentin  300 mg Oral BID  . haloperidol  10 mg Oral QHS  . haloperidol  5 mg Oral QAC breakfast  . losartan  50 mg Oral Daily  . nicotine  21 mg Transdermal Daily  . tamsulosin  0.4 mg Oral Daily  . temazepam  30 mg Oral QHS  . ticagrelor  90 mg Oral BID  . traZODone  50 mg Oral QHS,MR X 1   Continuous Infusions: PRN Meds:.hydrOXYzine, senna-docusate   Allergies: Seasonal NKDA  Past Surgical History: Past Surgical History:  Procedure Laterality Date  . IR ANGIO VERTEBRAL SEL SUBCLAVIAN INNOMINATE UNI R MOD SED  01/16/2019  . IR CT HEAD LTD  01/16/2019  . IR INTRAVSC STENT CERV CAROTID W/O EMB-PROT MOD SED INC ANGIO  01/16/2019  . IR PERCUTANEOUS ART THROMBECTOMY/INFUSION INTRACRANIAL INC DIAG ANGIO  01/16/2019  . RADIOLOGY WITH ANESTHESIA N/A 01/15/2019   Procedure: IR WITH ANESTHESIA;  Surgeon: Julieanne Cottoneveshwar, Sanjeev, MD;  Location: [facility] OR;  Service: Radiology;  Laterality: N/A;   Family History:  Could not elaborate   Family Psychiatric History:  Could not elaborate   Social History:  Tobacco - 1 ppd x 56 years  EtOH - denies  Illicits - denies   Additional Social History:  Patient currently has a fiance. He has not lived with her for the past two months. 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   Psychiatric Specialty Exam:  Physical Exam  Constitutional: He appears well-developed.  HENT:  Head: Atraumatic.  Respiratory: Effort normal.    Review of Systems  Constitutional: Negative.   Respiratory: Positive for cough.   Cardiovascular: Negative.   Gastrointestinal: Negative.     Vitals:   05/28/19 0556 05/29/19 0645  BP: (!) 154/77 139/70  Pulse: 94 86  Resp:    Temp:  98 F (36.7 C)  SpO2: 99% 97%     General Appearance: Disheveled and poorly groomed  Eye Contact:  Fair  Speech:  Garbled, slurred  Volume:  Increased  Mood:  Patient reports "Feeling fine"  Affect:  Elevated, Full,  stable  Thought Process:  Circumstantial  Orientation:  Other:  Alert to self and location, not to situation  Thought Content:  Paranoid Ideation and Tangential Denies thoughts of someone trying to kill him  Suicidal Thoughts:  Denies  Homicidal Thoughts:  Denies  Memory:  Immediate;   Good  Judgement:  Poor  Insight:  Lacking  Psychomotor Activity:  Normal  Concentration:  Concentration: Fair and Attention Span: Fair  Recall:  [employer] of Knowledge:  Poor  Language:  Fair  Akathisia:  No  Handed:  Right  AIMS (if indicated):     Assets:  Financial Resources/Insurance Housing  ADL's:  Intact  Cognition:  Impaired  Sleep:  Number of Hours: 6    Treatment Plan and Assessment:  Bryan Davies is a 68 year old man with PMH of HTN, untreated bipolar disorder, and anxiety who presented to hospitalization due to increased bizarre and paranoid behavior by his fiance. The patient has poor insight and judgement about his psychiatric diagnosis and treatment.  Of note, fiance endorses that the patient recently suffered from a stroke and did not pursue follow up treatment.   #Bipolar Disorder. Patient states that he was previously treated with divalproex and another drug. He has not taken his past medications for the past 4-5 years. He states that he became non-compliant with his medication since he could not afford the medications.  - Continue benztropine 0.5 mg  - Start divalproex 750 mg qhs - Continue haloperidol  - Continue temazepam 30 mg  - Continue trazodone qhs   # Right MCA Infarct - Plan to contact fiance to get collateral information. If a stroke did occur, may contribute to patient slurred speech and lack of insight/judgement. Also plan to inquire about head injury to see if CT scan is required to assess for subdural hematoma.  - Continue ticagrelor 90 mg  - Continue ASA 81 mg daily   #HTN - stable during hospitalization - Continue amlodipine 10 mg daily - Continue losartan 50  mg daily   #Tobacco Use: - Continue nicotine patch

## 2019-05-29 NOTE — Progress Notes (Signed)
Recreation Therapy Notes  INPATIENT RECREATION THERAPY ASSESSMENT  Patient Details Name: Bryan Davies MRN: [id number] DOB: 06-Jul-1951 Today's Date: 05/29/2019       Information Obtained From: Patient  Able to Participate in Assessment/Interview: Yes  Patient Presentation: Alert  Reason for Admission (Per Patient): Other (Comments)(Pt stated his girlfriend thought something was wrong with him.)  Patient Stressors: (Pt stated he had no stressors.)  Coping Skills:   TV, Music, Talk  Leisure Interests (2+):  Social - Friends, Individual - Other (Comment)(Pt stated drink coffee and socialize people.)  Frequency of Recreation/Participation: Other (Comment)(Daily)  Awareness of Community Resources:  Yes  Community Resources:  [place], Other (Comment)(Stores)  Current Use: Yes  If no, Barriers?:    Expressed Interest in [place]: No  County of Residence:  [place]  Patient Main Form of Transportation: Musician  Patient Strengths:  Merchant navy officer  Patient Identified Areas of Improvement:  None  Patient Goal for Hospitalization:  "get out of here"  Current SI (including self-harm):  No  Current HI:  No  Current AVH: No  Staff Intervention Plan: Group Attendance, Collaborate with Interdisciplinary Treatment Team  Consent to Intern Participation: N/A     Victorino Sparrow, LRT/CTRS  Ria Comment, Carole Deere A 05/29/2019, 11:21 AM

## 2019-05-29 NOTE — Tx Team (Signed)
Interdisciplinary Treatment and Diagnostic Plan Update  05/29/2019 Time of Session: 09:02am Bryan Davies MRN: [id number]  Principal Diagnosis: <principal problem not specified>  Secondary Diagnoses: Active Problems:   Bipolar 1 disorder (HCC)   Current Medications:  Current Facility-Administered Medications  Medication Dose Route Frequency Provider Last Rate Last Dose  . amLODipine (NORVASC) tablet 10 mg  10 mg Oral Daily Malvin JohnsFarah, Brian, MD   10 mg at 05/29/19 0913  . aspirin chewable tablet 81 mg  81 mg Oral Daily Rankin, Shuvon B, NP   81 mg at 05/29/19 0911  . atorvastatin (LIPITOR) tablet 40 mg  40 mg Oral Daily Rankin, Shuvon B, NP   40 mg at 05/29/19 0911  . benztropine (COGENTIN) tablet 0.5 mg  0.5 mg Oral BID Malvin JohnsFarah, Brian, MD   0.5 mg at 05/29/19 0910  . divalproex (DEPAKOTE ER) 24 hr tablet 750 mg  750 mg Oral QHS Malvin JohnsFarah, Brian, MD      . gabapentin (NEURONTIN) capsule 300 mg  300 mg Oral BID Rankin, Shuvon B, NP   300 mg at 05/29/19 0910  . haloperidol (HALDOL) tablet 10 mg  10 mg Oral QHS Malvin JohnsFarah, Brian, MD      . haloperidol (HALDOL) tablet 5 mg  5 mg Oral QAC breakfast Malvin JohnsFarah, Brian, MD   5 mg at 05/29/19 [id number]  . hydrOXYzine (ATARAX/VISTARIL) tablet 25 mg  25 mg Oral TID PRN Jackelyn PolingBerry, Jason A, NP      . losartan (COZAAR) tablet 50 mg  50 mg Oral Daily Malvin JohnsFarah, Brian, MD   50 mg at 05/29/19 0910  . nicotine (NICODERM CQ - dosed in mg/24 hours) patch 21 mg  21 mg Transdermal Daily Rankin, Shuvon B, NP      . senna-docusate (Senokot-S) tablet 2 tablet  2 tablet Oral BID PRN Rankin, Shuvon B, NP      . tamsulosin (FLOMAX) capsule 0.4 mg  0.4 mg Oral Daily Rankin, Shuvon B, NP   0.4 mg at 05/29/19 0910  . temazepam (RESTORIL) capsule 30 mg  30 mg Oral QHS Malvin JohnsFarah, Brian, MD      . ticagrelor [facility](BRILINTA) tablet 90 mg  90 mg Oral BID Rankin, Shuvon B, NP   90 mg at 05/29/19 0911  . traZODone (DESYREL) tablet 50 mg  50 mg Oral QHS,MR X 1 Nira ConnBerry, Jason A, NP   50 mg at 05/28/19 2108   PTA  Medications: Medications Prior to Admission  Medication Sig Dispense Refill Last Dose  . acetaminophen (TYLENOL) 500 MG tablet Take 500-1,000 mg by mouth every 6 (six) hours as needed for mild pain, moderate pain or headache.      Marland Kitchen. amLODipine (NORVASC) 5 MG tablet Take 1 tablet (5 mg total) by mouth daily. 90 tablet 0   . aspirin 81 MG chewable tablet Chew 1 tablet (81 mg total) by mouth daily.     Marland Kitchen. atorvastatin (LIPITOR) 40 MG tablet Take 1 tablet (40 mg total) by mouth daily. 30 tablet 11   . cyclobenzaprine (FLEXERIL) 10 MG tablet Take 1 tablet (10 mg total) by mouth 2 (two) times daily as needed for muscle spasms. (Patient not taking: Reported on 05/04/2019) 20 tablet 0   . gabapentin (NEURONTIN) 300 MG capsule Take 1 capsule (300 mg total) by mouth 2 (two) times daily. 60 capsule 11   . losartan (COZAAR) 25 MG tablet Take 25 mg by mouth daily.     . nicotine (NICODERM CQ - DOSED IN MG/24 HOURS) 14 mg/24hr patch  Place 1 patch (14 mg total) onto the skin daily. (Patient not taking: Reported on 05/04/2019) 28 patch 0   . sennosides-docusate sodium (SENOKOT-S) 8.6-50 MG tablet Take 2 tablets by mouth daily as needed for constipation.     . tamsulosin (FLOMAX) 0.4 MG CAPS capsule Take 0.4 mg by mouth daily.     . ticagrelor (BRILINTA) 90 MG TABS tablet Take 1 tablet (90 mg total) by mouth 2 (two) times daily. 60 tablet 11     Patient Stressors: Other: off medications  Patient Strengths: Motivation for treatment/growth Supportive family/friends  Treatment Modalities: Medication Management, Group therapy, Case management,  1 to 1 session with clinician, Psychoeducation, Recreational therapy.   Physician Treatment Plan for Primary Diagnosis: <principal problem not specified> Long Term Goal(s):     Short Term Goals:    Medication Management: Evaluate patient's response, side effects, and tolerance of medication regimen.  Therapeutic Interventions: 1 to 1 sessions, Unit Group sessions and  Medication administration.  Evaluation of Outcomes: Progressing  Physician Treatment Plan for Secondary Diagnosis: Active Problems:   Bipolar 1 disorder ([place])  Long Term Goal(s):     Short Term Goals:       Medication Management: Evaluate patient's response, side effects, and tolerance of medication regimen.  Therapeutic Interventions: 1 to 1 sessions, Unit Group sessions and Medication administration.  Evaluation of Outcomes: Progressing   RN Treatment Plan for Primary Diagnosis: <principal problem not specified> Long Term Goal(s): Knowledge of disease and therapeutic regimen to maintain health will improve  Short Term Goals: Ability to participate in decision making will improve, Ability to verbalize feelings will improve, Ability to disclose and discuss suicidal ideas, Ability to identify and develop effective coping behaviors will improve and Compliance with prescribed medications will improve  Medication Management: RN will administer medications as ordered by provider, will assess and evaluate patient's response and provide education to patient for prescribed medication. RN will report any adverse and/or side effects to prescribing provider.  Therapeutic Interventions: 1 on 1 counseling sessions, Psychoeducation, Medication administration, Evaluate responses to treatment, Monitor vital signs and CBGs as ordered, Perform/monitor CIWA, COWS, AIMS and Fall Risk screenings as ordered, Perform wound care treatments as ordered.  Evaluation of Outcomes: Progressing   LCSW Treatment Plan for Primary Diagnosis: <principal problem not specified> Long Term Goal(s): Safe transition to appropriate next level of care at discharge, Engage patient in therapeutic group addressing interpersonal concerns.  Short Term Goals: Engage patient in aftercare planning with referrals and resources and Increase skills for wellness and recovery  Therapeutic Interventions: Assess for all discharge needs, 1  to 1 time with Social worker, Explore available resources and support systems, Assess for adequacy in community support network, Educate family and significant other(s) on suicide prevention, Complete Psychosocial Assessment, Interpersonal group therapy.  Evaluation of Outcomes: Progressing   Progress in Treatment: Attending groups: Yes. Participating in groups: Yes. Taking medication as prescribed: Yes. Toleration medication: Yes. Family/Significant other contact made: Yes, individual(s) contacted:  pt's girlfriend Patient understands diagnosis: No. Discussing patient identified problems/goals with staff: Yes. Medical problems stabilized or resolved: Yes. Denies suicidal/homicidal ideation: Yes. Issues/concerns per patient self-inventory: No. Other:   New problem(s) identified: No, Describe:  None  New Short Term/Long Term Goal(s): Medication stabilization, elimination of SI thoughts, and development of a comprehensive mental wellness plan.   Patient Goals:    Discharge Plan or Barriers: CSW will continue to follow up for appropriate referrals and possible discharge planning  Reason for Continuation of Hospitalization: Delusions  Medication stabilization  Estimated Length of Stay: 3-5 days  Attendees: Patient: 05/29/2019   Physician: Dr. Malvin JohnsBrian Farah, MD 05/29/2019   Nursing: Rodell PernaPatrice, RN 05/29/2019  RN Care Manager: 05/29/2019   Social Worker: Stephannie PetersJasmine Earlean Fidalgo, LCSW 05/29/2019   Recreational Therapist:  05/29/2019  Other:  05/29/2019  Other:  05/29/2019  Other: 05/29/2019      Scribe for Treatment Team: Delphia GratesJasmine M Zakai Gonyea, LCSW 05/29/2019 11:09 AM

## 2019-05-29 NOTE — Progress Notes (Signed)
Pt presents with an animated affect and anxious mood. Pt denies SI/HI. Pt denies AVH. Pt reports good sleep last night. Pt appears to be responding to internal stimuli AEB pt talking to himself. The pt is calm and cooperative. No aggressive behaviors noted.   Pt compliant with taking meds. Verbal support provided. Pt encouraged to attend groups. 15 minute checks performed for safety.  Pt compliant with tx plan. No side effects to meds verbalized by pt. Pt verbalizes readiness for discharge.

## 2019-05-29 NOTE — Progress Notes (Signed)
Pt is seen talking to himself loudly in the dayroom. He was not able to provide a clear response to when RN was inquiring whether he was feeling SI/HI/AVH. He was demanding to RN and stated that he wanted coffee because it helps him sleep. No behavioral issues noted. With encouragement, Pt took trazodone as ordered. Support offered and safety maintained.

## 2019-05-30 DIAGNOSIS — F319 Bipolar disorder, unspecified: Principal | ICD-10-CM

## 2019-05-30 MED ORDER — TEMAZEPAM 15 MG PO CAPS
15.0000 mg | ORAL_CAPSULE | Freq: Every day | ORAL | Status: DC
  Administered 2019-05-30 – 2019-05-31 (×2): 15 mg via ORAL
  Filled 2019-05-30 (×2): qty 1

## 2019-05-30 MED ORDER — BENZTROPINE MESYLATE 0.5 MG PO TABS: 0.5000 mg | ORAL_TABLET | Freq: Two times a day (BID) | ORAL | Status: DC | PRN

## 2019-05-30 NOTE — Progress Notes (Signed)
   05/30/19 1008  COVID-19 Daily Checkoff  Have you had a fever (temp > 37.80C/100F)  in the past 24 hours?  No  If you have had runny nose, nasal congestion, sneezing in the past 24 hours, has it worsened? No  COVID-19 EXPOSURE  Have you traveled outside the state in the past 14 days? No  Have you been in contact with someone with a confirmed diagnosis of COVID-19 or PUI in the past 14 days without wearing appropriate PPE? No  Have you been living in the same home as a person with confirmed diagnosis of COVID-19 or a PUI (household contact)? No  Have you been diagnosed with COVID-19? No

## 2019-05-30 NOTE — Progress Notes (Signed)
D: Patient presents disorganized. He seems to still have delusions, as he continues to talk about a device (stent?) in his leg. Patient denies SI/HI/AVH.  A: Patient checked q15 min, and checks reviewed. Reviewed medication changes with patient and educated on side effects. Educated patient on importance of attending group therapy sessions and educated on several coping skills. Encouarged participation in milieu through recreation therapy and attending meals with peers. Support and encouragement provided. Fluids offered. R: Patient receptive to education on medications, and is medication compliant. Patient contracts for safety on the unit.

## 2019-05-30 NOTE — Progress Notes (Signed)
[name]  05/30/2019 11:05 AM Annye EnglishRoger D Davies  MRN:  [id number] Subjective: Patient is a 68 year old male admitted on 05/26/2019 with a past psychiatric history significant for bipolar disorder and a past medical history of hypertension and previous strokes admitted secondary to paranoia and auditory hallucinations.  Reportedly he had been noncompliant on his medications as well as smoking 5 to 6 packs of cigarettes a day.  Objective: Patient is seen and examined.  Patient is a 68 year old male with the above-stated past psychiatric history is seen in follow-up.  He stated he is taking his medications, and he is not getting angry about what people are saying about him.  He stated that people are still making fun of him, but it is not bothering him now.  Most likely these are still related to auditory hallucinations.  His speech is of a normal rate and tone.  He is very hoarse, and review of the record shows he has a significant history of tobacco use.  His current medications include amlodipine, Lipitor, Cogentin, Depakote, gabapentin, Haldol, losartan, Flomax, Brilinta, temazepam and trazodone.  His blood pressure stable at 118/55, and is mildly tachycardic at a rate of 101.  He is afebrile.  He slept 6.75 hours last night, and after he finished he stated that "I am going back to bed".  Review of his laboratories revealed normal liver function enzymes, normal CBC, decreased calcium and negative drug screen.  TSH was not obtained.  He had a CT scan of the head done on 05/03/2019 that showed chronic appearing right MCA territorial infarct.  No acute intracranial abnormalities.  His EKG showed a normal sinus rhythm.  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  He received TPA and had a right internal carotid artery stenting.  He also had an MRI done on 5/29.  No acute intracranial abnormality was noted.  There was normal expected internal evolution of  a previous right MCA territory infarct.  There was underlying mild atrophy with chronic small vessel ischemia.  He denied any suicidal ideation.  He denied any homicidal ideation.  Principal Problem: <principal problem not specified> Diagnosis: Active Problems:   Bipolar 1 disorder (HCC)  Total Time spent with patient: 30 minutes  Past Psychiatric History: See admission H&P  Past Medical History:  Past Medical History:  Diagnosis Date  . Anxiety   . Hypertension   . Shingles 2013  . Shoulder pain     Past Surgical History:  Procedure Laterality Date  . IR ANGIO VERTEBRAL SEL SUBCLAVIAN INNOMINATE UNI R MOD SED  01/16/2019  . IR CT HEAD LTD  01/16/2019  . IR INTRAVSC STENT CERV CAROTID W/O EMB-PROT MOD SED INC ANGIO  01/16/2019  . IR PERCUTANEOUS ART THROMBECTOMY/INFUSION INTRACRANIAL INC DIAG ANGIO  01/16/2019  . RADIOLOGY WITH ANESTHESIA N/A 01/15/2019   Procedure: IR WITH ANESTHESIA;  Surgeon: Julieanne Cottoneveshwar, Sanjeev, MD;  Location: [facility] OR;  Service: Radiology;  Laterality: N/A;   Family History: History reviewed. No pertinent family history. Family Psychiatric  History: See admission H&P Social History:  Social History   Substance and Sexual Activity  Alcohol Use No     Social History   Substance and Sexual Activity  Drug Use No    Social History   Socioeconomic History  . Marital status: Single    Spouse name: Not on file  . Number of children: Not on file  . Years of education: Not on file  .  Highest education level: Not on file  Occupational History  . Occupation: Games developer  Social Needs  . Financial resource strain: Not on file  . Food insecurity    Worry: Not on file    Inability: Not on file  . Transportation needs    Medical: Not on file    Non-medical: Not on file  Tobacco Use  . Smoking status: Current Every Day Smoker    Packs/day: 1.00    Years: 50.00    Pack years: 50.00    Types: Cigarettes  . Smokeless tobacco: Never Used  . Tobacco comment: 10   cigarettes per day, waits an hour between  Substance and Sexual Activity  . Alcohol use: No  . Drug use: No  . Sexual activity: Not on file  Lifestyle  . Physical activity    Days per week: Not on file    Minutes per session: Not on file  . Stress: Not on file  Relationships  . Social Herbalist on phone: Not on file    Gets together: Not on file    Attends religious service: Not on file    Active member of club or organization: Not on file    Attends meetings of clubs or organizations: Not on file    Relationship status: Not on file  Other Topics Concern  . Not on file  Social History Narrative  . Not on file   Additional Social History:                         Sleep: Good  Appetite:  Fair  Current Medications: Current Facility-Administered Medications  Medication Dose Route Frequency Provider Last Rate Last Dose  . amLODipine (NORVASC) tablet 10 mg  10 mg Oral Daily Johnn Hai, MD   10 mg at 05/30/19 0757  . aspirin chewable tablet 81 mg  81 mg Oral Daily Rankin, Shuvon B, NP   81 mg at 05/30/19 0756  . atorvastatin (LIPITOR) tablet 40 mg  40 mg Oral Daily Rankin, Shuvon B, NP   40 mg at 05/30/19 0756  . benztropine (COGENTIN) tablet 0.5 mg  0.5 mg Oral BID Johnn Hai, MD   0.5 mg at 05/30/19 0757  . divalproex (DEPAKOTE ER) 24 hr tablet 750 mg  750 mg Oral QHS Johnn Hai, MD   750 mg at 05/29/19 2134  . gabapentin (NEURONTIN) capsule 300 mg  300 mg Oral BID Rankin, Shuvon B, NP   300 mg at 05/30/19 0756  . haloperidol (HALDOL) tablet 10 mg  10 mg Oral QHS Johnn Hai, MD   10 mg at 05/29/19 2134  . haloperidol (HALDOL) tablet 5 mg  5 mg Oral QAC breakfast Johnn Hai, MD   5 mg at 05/30/19 8101  . hydrOXYzine (ATARAX/VISTARIL) tablet 25 mg  25 mg Oral TID PRN Rozetta Nunnery, NP      . losartan (COZAAR) tablet 50 mg  50 mg Oral Daily Johnn Hai, MD   50 mg at 05/30/19 0756  . nicotine (NICODERM CQ - dosed in mg/24 hours) patch 21 mg  21 mg  Transdermal Daily Rankin, Shuvon B, NP      . senna-docusate (Senokot-S) tablet 2 tablet  2 tablet Oral BID PRN Rankin, Shuvon B, NP      . tamsulosin (FLOMAX) capsule 0.4 mg  0.4 mg Oral Daily Rankin, Shuvon B, NP   0.4 mg at 05/30/19 0757  . temazepam (RESTORIL) capsule 30  mg  30 mg Oral QHS Malvin JohnsFarah, Brian, MD   30 mg at 05/29/19 2134  . ticagrelor (BRILINTA) tablet 90 mg  90 mg Oral BID Rankin, Shuvon B, NP   90 mg at 05/30/19 0757  . traZODone (DESYREL) tablet 50 mg  50 mg Oral QHS,MR X 1 Nira ConnBerry, Jason A, NP   50 mg at 05/29/19 2134    Lab Results: No results found for this or any previous visit (from the past 48 hour(s)).  Blood Alcohol level:  Lab Results  Component Value Date   ETH <10 05/26/2019   ETH <10 05/02/2019    Metabolic Disorder Labs: Lab Results  Component Value Date   HGBA1C 6.6 (H) 01/16/2019   MPG 142.72 01/16/2019   No results found for: PROLACTIN Lab Results  Component Value Date   CHOL 152 01/16/2019   TRIG 107 01/16/2019   HDL 35 (L) 01/16/2019   CHOLHDL 4.3 01/16/2019   VLDL 21 01/16/2019   LDLCALC 96 01/16/2019   LDLCALC 96 12/05/2015    Physical Findings: AIMS: Facial and Oral Movements Muscles of Facial Expression: None, normal Lips and Perioral Area: None, normal Jaw: None, normal Tongue: None, normal,Extremity Movements Upper (arms, wrists, hands, fingers): None, normal Lower (legs, knees, ankles, toes): None, normal, Trunk Movements Neck, shoulders, hips: None, normal, Overall Severity Severity of abnormal movements (highest score from questions above): None, normal Incapacitation due to abnormal movements: None, normal Patient's awareness of abnormal movements (rate only patient's report): No Awareness, Dental Status Current problems with teeth and/or dentures?: No Does patient usually wear dentures?: No  CIWA:  CIWA-Ar Total: 0 COWS:  COWS Total Score: 2  Musculoskeletal: Strength & Muscle Tone: decreased Gait & Station:  unsteady Patient leans: N/A  Psychiatric Specialty Exam: Physical Exam  Nursing note and vitals reviewed. Constitutional: He is oriented to person, place, and time. He appears well-developed and well-nourished.  HENT:  Head: Atraumatic.  Respiratory: Effort normal.  Neurological: He is alert and oriented to person, place, and time.    ROS  Blood pressure (!) 146/67, pulse 91, temperature 97.9 F (36.6 C), temperature source Oral, resp. rate 16, SpO2 97 %.There is no height or weight on file to calculate BMI.  General Appearance: Disheveled  Eye Contact:  Fair  Speech:  Slow  Volume:  Decreased  Mood:  Euthymic  Affect:  Congruent  Thought Process:  Goal Directed and Descriptions of Associations: Circumstantial  Orientation:  Full (Time, Place, and Person)  Thought Content:  Logical  Suicidal Thoughts:  No  Homicidal Thoughts:  No  Memory:  Immediate;   Poor Recent;   Poor Remote;   Poor  Judgement:  Impaired  Insight:  Fair  Psychomotor Activity:  Psychomotor Retardation  Concentration:  Concentration: Fair and Attention Span: Fair  Recall:  FiservFair  Fund of Knowledge:  Fair  Language:  Fair  Akathisia:  Negative  Handed:  Right  AIMS (if indicated):     Assets:  Desire for Improvement Resilience  ADL's:  Impaired  Cognition:  Impaired,  Mild  Sleep:  Number of Hours: 6.75     Treatment Plan Summary: Daily contact with patient to assess and evaluate symptoms and progress in treatment, Medication management and Plan : Patient is seen and examined.  Patient is a 68 year old male with the above-stated past medical and psychiatric history who is seen in follow-up.   Diagnosis: #1 bipolar disorder most recently manic with psychotic features, #2 previous MCA stroke, #3 hypertension, #4 COPD, #  5 tobacco dependence  Patient is seen in follow-up.  Review of the nursing notes show that he has been compliant with medications and pleasant on the unit.  He may be having some  auditory hallucinations with the people that are "making fun of him".  He appears to be somewhat overmedicated currently.  I am going to decrease his Restoril to 15 mg p.o. nightly for now.  We will order a Depakote level on August 2 or 3 to give it 3 days to get into his system.  I am going to change the Cogentin to PRN given the propensity of it because anticholinergic side effects does appear to be more cognitive influencing.  No change in the rest of his medications for hypertension and vascular integrity.  Given his COPD and what appears to be weight loss by the way his clothes fit I am going to order a chest x-ray today, and as well get a TSH to make sure that nothing is influencing his psychiatric condition externally. 1.  Continue amlodipine 10 mg p.o. daily for hypertension. 2.  Continue a coated aspirin 81 mg p.o. daily for vascular disease. 3.  Continue Lipitor 40 mg p.o. daily for hyperlipidemia. 4.  Change Cogentin to 0.5 mg p.o. twice daily as needed side effects of Haldol. 5.  Continue Depakote ER 750 mg p.o. nightly for mood stability. 6.  Continue gabapentin 300 mg p.o. twice daily for mood stability and chronic pain. 7.  Continue haloperidol 5 mg p.o. daily and 10 mg p.o. nightly for psychosis and mood stability. 7.  Continue losartan 50 mg p.o. daily for hypertension. 8.  Continue hydroxyzine 25 mg p.o. 3 times daily as needed anxiety. 9.  Continue Flomax 0.4 mg p.o. daily for BPH. 10.  Continue trazodone 50 mg p.o. nightly as needed insomnia. 11.  Decrease Restoril to 15 mg p.o. nightly for insomnia. 12.  Chest x-ray given COPD and smoking dependence. 13.  Order TSH in a.m. 14.  Disposition planning-in progress.  Antonieta PertGreg Lawson Larence Thone, MD 05/30/2019, 11:05 AM

## 2019-05-30 NOTE — Progress Notes (Signed)
DAR NOTE: Pt present with bright affect and pleasant mood in the unit. Pt has been isolating himself in the room this evening.  Pt denies physical pain, took all his meds as scheduled. Pt's safety ensured with 15 minute and environmental checks. Pt currently denies SI/HI and A/V hallucinations. Pt verbally agrees to seek staff if SI/HI or A/VH occurs and to consult with staff before acting on these thoughts. Will continue POC.

## 2019-05-31 NOTE — Progress Notes (Signed)
Self-inventory: Patient slept well last night and declined medication for sleep. His appetite is good. He rates his depression and anxiety 0/10. He c/o of "cramping" but denied withdrawal symptoms. He denies physical complaints. Goals: "go home" "talk friend" "I just want to go home".

## 2019-05-31 NOTE — Progress Notes (Signed)
DAR NOTE: Pt present with calm affect and pleasant  mood in the unit. Pt has been visible in the milieu with peers. Pt denies physical pain, took all his meds as scheduled.  Pt's safety ensured with 15 minute and environmental checks. Pt currently denies SI/HI and A/V hallucinations. Pt verbally agrees to seek staff if SI/HI or A/VH occurs and to consult with staff before acting on these thoughts. Will continue POC.

## 2019-05-31 NOTE — Progress Notes (Signed)
   05/31/19 0749  COVID-19 Daily Checkoff  Have you had a fever (temp > 37.80C/100F)  in the past 24 hours?  No  If you have had runny nose, nasal congestion, sneezing in the past 24 hours, has it worsened? No  COVID-19 EXPOSURE  Have you traveled outside the state in the past 14 days? No  Have you been in contact with someone with a confirmed diagnosis of COVID-19 or PUI in the past 14 days without wearing appropriate PPE? No  Have you been living in the same home as a person with confirmed diagnosis of COVID-19 or a PUI (household contact)? No  Have you been diagnosed with COVID-19? No

## 2019-05-31 NOTE — Progress Notes (Signed)
Adult Psychoeducational Group Note  Date:  05/31/2019 Time:  10:54 PM  Group Topic/Focus:  Wrap-Up Group:   The focus of this group is to help patients review their daily goal of treatment and discuss progress on daily workbooks.  Participation Level:  Minimal  Participation Quality:  Appropriate  Affect:  Appropriate  Cognitive:  Appropriate  Insight: Appropriate  Engagement in Group:  Limited  Modes of Intervention:  Discussion  Additional Comments: Pt stated his goal for today was to talk with his doctor about his discharge plan. Pt stated he talk with his doctor today but doctor did not discuss a discharge plan with him today. Pt felt his relationship with his family is improving since he came here. Pt stated he felt better about himself today. Pt rated his day a 5 out of 10. Pt stated his appetite was good today. Pt stated he planned to get good rest again tonight. Pt complained of no pain this evening.  Candy Sledge 05/31/2019, 10:54 PM

## 2019-05-31 NOTE — Progress Notes (Signed)
D: Patient is still disorganized and difficult to follow. Patient denies SI/HI/AVH. He is pleasant and cooperative. A: Patient checked q15 min, and checks reviewed. Reviewed medication changes with patient and educated on side effects. Educated patient on importance of attending group therapy sessions and educated on several coping skills. Encouarged participation in milieu through recreation therapy and attending meals with peers. Support and encouragement provided. Fluids offered. R: Patient receptive to education on medications, and is medication compliant. Patient contracts for safety on the unit.

## 2019-05-31 NOTE — Progress Notes (Signed)
[facility]  05/31/2019 9:21 AM Annye EnglishRoger D Mungin  MRN:  [id number] Subjective:  Patient is a 68 year old male admitted on 05/26/2019 with a past psychiatric history significant for bipolar disorder and a past medical history of hypertension and previous strokes admitted secondary to paranoia and auditory hallucinations.  Reportedly he had been noncompliant on his medications as well as smoking 5 to 6 packs of cigarettes a day.  Objective: Patient is seen and examined.  Patient is a 68 year old male with the above-stated past psychiatric history who is seen in follow-up.  Nursing notes from yesterday reflected that he had a calm affect and was pleasant on the unit.  He denied any suicidal or homicidal ideation yesterday, and denied any auditory or visual hallucinations.  His blood pressure is 120/63 today, he is still mildly tachycardic with a rate of 102.  He is afebrile.  He slept 6 hours last night.  His Restoril yesterday was decreased to 15 mg p.o. nightly for insomnia.  A TSH was ordered for this morning, and the results are not available yet.  The plan is to get a chest x-ray tomorrow secondary to his 60 pound weight loss.  He denies suicidal or homicidal ideation.  He denies any auditory or visual hallucinations.  Principal Problem: <principal problem not specified> Diagnosis: Active Problems:   Bipolar 1 disorder (HCC)  Total Time spent with patient: 15 minutes  Past Psychiatric History: See admission H&P  Past Medical History:  Past Medical History:  Diagnosis Date  . Anxiety   . Hypertension   . Shingles 2013  . Shoulder pain     Past Surgical History:  Procedure Laterality Date  . IR ANGIO VERTEBRAL SEL SUBCLAVIAN INNOMINATE UNI R MOD SED  01/16/2019  . IR CT HEAD LTD  01/16/2019  . IR INTRAVSC STENT CERV CAROTID W/O EMB-PROT MOD SED INC ANGIO  01/16/2019  . IR PERCUTANEOUS ART THROMBECTOMY/INFUSION INTRACRANIAL INC DIAG ANGIO  01/16/2019  . RADIOLOGY WITH ANESTHESIA N/A  01/15/2019   Procedure: IR WITH ANESTHESIA;  Surgeon: Julieanne Cottoneveshwar, Sanjeev, MD;  Location: [facility] OR;  Service: Radiology;  Laterality: N/A;   Family History: History reviewed. No pertinent family history. Family Psychiatric  History: See admission H&P Social History:  Social History   Substance and Sexual Activity  Alcohol Use No     Social History   Substance and Sexual Activity  Drug Use No    Social History   Socioeconomic History  . Marital status: Single    Spouse name: Not on file  . Number of children: Not on file  . Years of education: Not on file  . Highest education level: Not on file  Occupational History  . Occupation: Music therapistCarpenter  Social Needs  . Financial resource strain: Not on file  . Food insecurity    Worry: Not on file    Inability: Not on file  . Transportation needs    Medical: Not on file    Non-medical: Not on file  Tobacco Use  . Smoking status: Current Every Day Smoker    Packs/day: 1.00    Years: 50.00    Pack years: 50.00    Types: Cigarettes  . Smokeless tobacco: Never Used  . Tobacco comment: 10  cigarettes per day, waits an hour between  Substance and Sexual Activity  . Alcohol use: No  . Drug use: No  . Sexual activity: Not on file  Lifestyle  . Physical activity    Days per week: Not on  file    Minutes per session: Not on file  . Stress: Not on file  Relationships  . Social Herbalist on phone: Not on file    Gets together: Not on file    Attends religious service: Not on file    Active member of club or organization: Not on file    Attends meetings of clubs or organizations: Not on file    Relationship status: Not on file  Other Topics Concern  . Not on file  Social History Narrative  . Not on file   Additional Social History:                         Sleep: Good  Appetite:  Good  Current Medications: Current Facility-Administered Medications  Medication Dose Route Frequency Provider Last Rate Last  Dose  . amLODipine (NORVASC) tablet 10 mg  10 mg Oral Daily Johnn Hai, MD   10 mg at 05/31/19 0749  . aspirin chewable tablet 81 mg  81 mg Oral Daily Rankin, Shuvon B, NP   81 mg at 05/31/19 0749  . atorvastatin (LIPITOR) tablet 40 mg  40 mg Oral Daily Rankin, Shuvon B, NP   40 mg at 05/31/19 0749  . benztropine (COGENTIN) tablet 0.5 mg  0.5 mg Oral BID PRN Sharma Covert, MD      . divalproex (DEPAKOTE ER) 24 hr tablet 750 mg  750 mg Oral QHS Johnn Hai, MD   750 mg at 05/30/19 2101  . gabapentin (NEURONTIN) capsule 300 mg  300 mg Oral BID Rankin, Shuvon B, NP   300 mg at 05/31/19 0749  . haloperidol (HALDOL) tablet 10 mg  10 mg Oral QHS Johnn Hai, MD   10 mg at 05/30/19 2102  . haloperidol (HALDOL) tablet 5 mg  5 mg Oral QAC breakfast Johnn Hai, MD   5 mg at 05/31/19 5361  . hydrOXYzine (ATARAX/VISTARIL) tablet 25 mg  25 mg Oral TID PRN Rozetta Nunnery, NP      . losartan (COZAAR) tablet 50 mg  50 mg Oral Daily Johnn Hai, MD   50 mg at 05/31/19 0749  . nicotine (NICODERM CQ - dosed in mg/24 hours) patch 21 mg  21 mg Transdermal Daily Rankin, Shuvon B, NP      . senna-docusate (Senokot-S) tablet 2 tablet  2 tablet Oral BID PRN Rankin, Shuvon B, NP      . tamsulosin (FLOMAX) capsule 0.4 mg  0.4 mg Oral Daily Rankin, Shuvon B, NP   0.4 mg at 05/31/19 0750  . temazepam (RESTORIL) capsule 15 mg  15 mg Oral QHS Sharma Covert, MD   15 mg at 05/30/19 2101  . ticagrelor (BRILINTA) tablet 90 mg  90 mg Oral BID Rankin, Shuvon B, NP   90 mg at 05/31/19 0749  . traZODone (DESYREL) tablet 50 mg  50 mg Oral QHS,MR X 1 Lindon Romp A, NP   50 mg at 05/30/19 2101    Lab Results: No results found for this or any previous visit (from the past 48 hour(s)).  Blood Alcohol level:  Lab Results  Component Value Date   [facility] <10 05/26/2019   ETH <10 44/31/5400    Metabolic Disorder Labs: Lab Results  Component Value Date   HGBA1C 6.6 (H) 01/16/2019   MPG 142.72 01/16/2019   No results  found for: PROLACTIN Lab Results  Component Value Date   CHOL 152 01/16/2019  TRIG 107 01/16/2019   HDL 35 (L) 01/16/2019   CHOLHDL 4.3 01/16/2019   VLDL 21 01/16/2019   LDLCALC 96 01/16/2019   LDLCALC 96 12/05/2015    Physical Findings: AIMS: Facial and Oral Movements Muscles of Facial Expression: None, normal Lips and Perioral Area: None, normal Jaw: None, normal Tongue: None, normal,Extremity Movements Upper (arms, wrists, hands, fingers): None, normal Lower (legs, knees, ankles, toes): None, normal, Trunk Movements Neck, shoulders, hips: None, normal, Overall Severity Severity of abnormal movements (highest score from questions above): None, normal Incapacitation due to abnormal movements: None, normal Patient's awareness of abnormal movements (rate only patient's report): No Awareness, Dental Status Current problems with teeth and/or dentures?: No Does patient usually wear dentures?: No  CIWA:  CIWA-Ar Total: 0 COWS:  COWS Total Score: 2  Musculoskeletal: Strength & Muscle Tone: within normal limits Gait & Station: normal Patient leans: N/A  Psychiatric Specialty Exam: Physical Exam  Nursing note and vitals reviewed. Constitutional: He is oriented to person, place, and time. He appears well-developed and well-nourished.  HENT:  Head: Normocephalic and atraumatic.  Respiratory: Effort normal.  Neurological: He is alert and oriented to person, place, and time.    ROS  Blood pressure 120/63, pulse (!) 102, temperature 98.7 F (37.1 C), temperature source Oral, resp. rate 16, SpO2 97 %.There is no height or weight on file to calculate BMI.  General Appearance: Casual  Eye Contact:  Fair  Speech:  Garbled  Volume:  Normal  Mood:  Anxious  Affect:  Congruent  Thought Process:  Disorganized and Descriptions of Associations: Circumstantial  Orientation:  Negative  Thought Content:  Logical  Suicidal Thoughts:  No  Homicidal Thoughts:  No  Memory:  Immediate;    Fair Recent;   Fair Remote;   Fair  Judgement:  Intact  Insight:  Lacking  Psychomotor Activity:  Increased  Concentration:  Concentration: Fair and Attention Span: Fair  Recall:  Poor  Fund of Knowledge:  Fair  Language:  Fair  Akathisia:  Negative  Handed:  Right  AIMS (if indicated):     Assets:  Desire for Improvement Resilience  ADL's:  Intact  Cognition:  Impaired,  Mild  Sleep:  Number of Hours: 6     Treatment Plan Summary: Daily contact with patient to assess and evaluate symptoms and progress in treatment, Medication management and Plan : Patient is seen and examined.  Patient is a 68 year old male with the above-stated past psychiatric history who is seen in follow-up.   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  He is essentially unchanged from yesterday.  I do think that he has a degree of vascular dementia on top of his previous CVA.  He denies suicidal or homicidal ideation.  He denied any auditory or visual hallucinations.  His TSH is pending, and we will get a chest x-ray tomorrow given his smoking history and weight loss.  His blood pressure is stable although he is mildly tachycardic.  No change in his medications today. 1.  Continue amlodipine 10 mg p.o. daily for hypertension. 2.  Continue a coated aspirin 81 mg p.o. daily for vascular disease. 3.  Continue Lipitor 40 mg p.o. daily for hyperlipidemia. 4.    Continue Cogentin to 0.5 mg p.o. twice daily as needed side effects of Haldol. 5.  Continue  Depakote ER 750 mg p.o. nightly for mood stability. 6.  Continue gabapentin 300 mg p.o. twice daily for mood stability and chronic pain. 7.  Continue haloperidol 5 mg p.o. daily and 10 mg p.o. nightly for psychosis and mood stability. 7.  Continue losartan 50 mg p.o. daily  for hypertension. 8.  Continue hydroxyzine 25 mg p.o. 3 times daily as needed anxiety. 9.  Continue Flomax 0.4 mg p.o. daily for BPH. 10.  Continue trazodone 50 mg p.o. nightly as needed insomnia. 11.  Decrease Restoril to 15 mg p.o. nightly for insomnia. 12.  Chest x-ray given COPD and smoking dependence. 13.    TSH results pending. 14.  Depakote level, CBC with differential and LFTs in a.m. tomorrow 15. Disposition planning-in progress.   Antonieta PertGreg Lawson Cathie Bonnell, MD 05/31/2019, 9:21 AM

## 2019-06-01 ENCOUNTER — Ambulatory Visit (HOSPITAL_COMMUNITY)
Admission: RE | Admit: 2019-06-01 | Discharge: 2019-06-01 | Disposition: A | Discharge status: Home / Self Care | Payer: Medicare Other | Source: Ambulatory Visit | Attending: Psychiatry | Admitting: Psychiatry

## 2019-06-01 DIAGNOSIS — R634 Abnormal weight loss: Secondary | ICD-10-CM | POA: Diagnosis not present

## 2019-06-01 LAB — CBC WITH DIFFERENTIAL/PLATELET
Abs Immature Granulocytes: 0.31 10*3/uL — ABNORMAL HIGH (ref 0.00–0.07)
Basophils Absolute: 0.1 10*3/uL (ref 0.0–0.1)
Basophils Relative: 1 %
Eosinophils Absolute: 0.2 10*3/uL (ref 0.0–0.5)
Eosinophils Relative: 3 %
HCT: 44.6 % (ref 39.0–52.0)
Hemoglobin: 14.2 g/dL (ref 13.0–17.0)
Immature Granulocytes: 4 %
Lymphocytes Relative: 19 %
Lymphs Abs: 1.6 10*3/uL (ref 0.7–4.0)
MCH: 29.9 pg (ref 26.0–34.0)
MCHC: 31.8 g/dL (ref 30.0–36.0)
MCV: 93.9 fL (ref 80.0–100.0)
Monocytes Absolute: 0.7 10*3/uL (ref 0.1–1.0)
Monocytes Relative: 9 %
Neutro Abs: 5.1 10*3/uL (ref 1.7–7.7)
Neutrophils Relative %: 64 %
Platelets: 203 10*3/uL (ref 150–400)
RBC: 4.75 MIL/uL (ref 4.22–5.81)
RDW: 12.8 % (ref 11.5–15.5)
WBC: 8 10*3/uL (ref 4.0–10.5)
nRBC: 0 % (ref 0.0–0.2)

## 2019-06-01 LAB — HEPATIC FUNCTION PANEL
ALT: 17 U/L (ref 0–44)
AST: 15 U/L (ref 15–41)
Albumin: 3.5 g/dL (ref 3.5–5.0)
Alkaline Phosphatase: 82 U/L (ref 38–126)
Bilirubin, Direct: 0.1 mg/dL (ref 0.0–0.2)
Indirect Bilirubin: 0.4 mg/dL (ref 0.3–0.9)
Total Bilirubin: 0.5 mg/dL (ref 0.3–1.2)
Total Protein: 6.9 g/dL (ref 6.5–8.1)

## 2019-06-01 LAB — VALPROIC ACID LEVEL: Valproic Acid Lvl: 51 ug/mL (ref 50.0–100.0)

## 2019-06-01 LAB — TSH: TSH: 1.614 u[IU]/mL (ref 0.350–4.500)

## 2019-06-01 MED ORDER — DIVALPROEX SODIUM ER 250 MG PO TB24: 750.0000 mg | ORAL_TABLET | Freq: Every day | ORAL | 2 refills | Status: DC

## 2019-06-01 MED ORDER — BENZTROPINE MESYLATE 0.5 MG PO TABS: 0.5000 mg | ORAL_TABLET | Freq: Two times a day (BID) | ORAL | 2 refills | Status: DC

## 2019-06-01 MED ORDER — HALOPERIDOL 5 MG PO TABS: 15.0000 mg | ORAL_TABLET | Freq: Every day | ORAL | 2 refills | Status: DC

## 2019-06-01 MED ORDER — LOSARTAN POTASSIUM 50 MG PO TABS: 50.0000 mg | ORAL_TABLET | Freq: Every day | ORAL | 2 refills | Status: AC

## 2019-06-01 MED ORDER — TEMAZEPAM 15 MG PO CAPS: 15.0000 mg | ORAL_CAPSULE | Freq: Every day | ORAL | 1 refills | Status: DC

## 2019-06-01 NOTE — Discharge Summary (Signed)
Physician Discharge Summary Note  Patient:  Bryan Davies is an 68 y.o., male MRN:  [id number] DOB:  Nov 02, 1950 Patient phone:  [phone] (home)  Patient address:   [place] [address],  Total Time spent with patient: 45 minutes  Date of Admission:  05/27/2019 Date of Discharge: 06/01/2019  Reason for Admission:   The patient brought himself voluntarily to the hospital at his fiance's request. Upon talking to him, he was very angry and stated that he doesn't need any help and he only came to get "her off his [explicit] back." He endorses that she is the one that needs help, not him. His speech is extremely slurred and he has loosening of associations. At one point, he does mention that "someone is trying to kill him." When he is redirected, he gets angry and states that "he won't let anyone hurt him."  He endorses that he has a history of bipolar. He was treated with valproic acid and one other medication, but he could not afford the medication. He states that he has not been taking medications nor seeing a psychiatrist for the past 4-5 years.   He is alert and oriented to self but not to situation. Patient could not be redirected to answer questions about self harm or harm to others.   According to the assessment team evaluation of 7/29 Bryan Davies a 68 y.o.malewho presented to [facility] at the urging of his fiancee Bryan Davies -- [phone]) due to increased paranoia and bizarre behavior. Pt is now under IVC (petitioner is K. Ward, DO). Pt was last assessed by TTS on 05/03/2019. At that time, he came to the ED under IVC due to suicidal ideation and paranoia (stating that people are freezing his bank account) He was treated at [facility]. Pt lives with his fiancee, and he works as a Games developer.  Pt stated that he does not know why he is at the hospital. He denied suicidal ideation, homicidal ideation, hallucination, and paranoia. He stated that he wanted  to go home so he could take some medication. Per notes, Pt is not compliant with his psychotropic medication, and he does not have a psychiatrist. The attending physician reached Pt's fiancee, and she provided further history. Per fiancee, Pt is increasingly paranoid -- he has stated that people are following him and trying to kill him, that people are threatening him on the phone. Bryan Davies also stated that Pt appeared to respond to auditory hallucination, and that he communicated a desire to commit suicide by police. Per fiancee, Pt also paces in the street in the middle of the night and knocks on the doors of neighbors.  During assessment, Pt presented as alert and oriented to time and place, but not situation (''I don't know why I'm here''). He had good eye contact. Pt's demeanor was suspicious. Pt's mood was irritable. Affect was mood congruent. Pt's speech was slurred. Thought processes were within normal range. Thought content could not be adequately assessed -- Pt denied hallucination and delu sion, but per report, he frequently endorses such. Concentration was good. Memory could not be adequately assessed. Pt's impulse control and insight were poor. Judgment was fair. Consulted with Peri Maris, DO, who determined that Pt meets inpatient criteria -- [address] hall.    Principal Problem: <principal problem not specified> Discharge Diagnoses: Active Problems:   Bipolar 1 disorder (HCC)   Past Psychiatric History: see eval  Past Medical History:  Past Medical History:  Diagnosis Date  . Anxiety   .  Hypertension   . Shingles 2013  . Shoulder pain     Past Surgical History:  Procedure Laterality Date  . IR ANGIO VERTEBRAL SEL SUBCLAVIAN INNOMINATE UNI R MOD SED  01/16/2019  . IR CT HEAD LTD  01/16/2019  . IR INTRAVSC STENT CERV CAROTID W/O EMB-PROT MOD SED INC ANGIO  01/16/2019  . IR PERCUTANEOUS ART THROMBECTOMY/INFUSION INTRACRANIAL INC DIAG ANGIO  01/16/2019  . RADIOLOGY WITH  ANESTHESIA N/A 01/15/2019   Procedure: IR WITH ANESTHESIA;  Surgeon: Julieanne Cottoneveshwar, Sanjeev, MD;  Location: [facility] OR;  Service: Radiology;  Laterality: N/A;   Family History: History reviewed. No pertinent family history. Family Psychiatric  History: neg Social History:  Social History   Substance and Sexual Activity  Alcohol Use No     Social History   Substance and Sexual Activity  Drug Use No    Social History   Socioeconomic History  . Marital status: Single    Spouse name: Not on file  . Number of children: Not on file  . Years of education: Not on file  . Highest education level: Not on file  Occupational History  . Occupation: Music therapistCarpenter  Social Needs  . Financial resource strain: Not on file  . Food insecurity    Worry: Not on file    Inability: Not on file  . Transportation needs    Medical: Not on file    Non-medical: Not on file  Tobacco Use  . Smoking status: Current Every Day Smoker    Packs/day: 1.00    Years: 50.00    Pack years: 50.00    Types: Cigarettes  . Smokeless tobacco: Never Used  . Tobacco comment: 10  cigarettes per day, waits an hour between  Substance and Sexual Activity  . Alcohol use: No  . Drug use: No  . Sexual activity: Not on file  Lifestyle  . Physical activity    Days per week: Not on file    Minutes per session: Not on file  . Stress: Not on file  Relationships  . Social Musicianconnections    Talks on phone: Not on file    Gets together: Not on file    Attends religious service: Not on file    Active member of club or organization: Not on file    Attends meetings of clubs or organizations: Not on file    Relationship status: Not on file  Other Topics Concern  . Not on file  Social History Narrative  . Not on file    Hospital Course:   Patient was admitted under routine precautions and we reinstituted Depakote and Haldol, engagement cognitive and reality based therapies.  By the date of the third he seem baseline he was alert  oriented to person place time day and situation cooperative reporting no auditory or visual hallucinations his mood is very stable and he was compliant with meds.  We think he stable to be released home.  Physical Findings: AIMS: Facial and Oral Movements Muscles of Facial Expression: None, normal Lips and Perioral Area: None, normal Jaw: None, normal Tongue: None, normal,Extremity Movements Upper (arms, wrists, hands, fingers): None, normal Lower (legs, knees, ankles, toes): None, normal, Trunk Movements Neck, shoulders, hips: None, normal, Overall Severity Severity of abnormal movements (highest score from questions above): None, normal Incapacitation due to abnormal movements: None, normal Patient's awareness of abnormal movements (rate only patient's report): No Awareness, Dental Status Current problems with teeth and/or dentures?: No Does patient usually wear dentures?: No  CIWA:  CIWA-Ar Total: 0 COWS:  COWS Total Score: 2  Musculoskeletal: Strength & Muscle Tone: within normal limits Gait & Station: normal Patient leans: N/A  Psychiatric Specialty Exam: ROS  Blood pressure 107/67, pulse 96, temperature 98.7 F (37.1 C), temperature source Oral, resp. rate 16, SpO2 98 %.There is no height or weight on file to calculate BMI.  General Appearance: Casual  Eye Contact::  Good  Speech:  Clear and Coherent409  Volume:  Normal  Mood:  Euthymic  Affect: Congruent  Thought Process:  Coherent, Goal Directed and Descriptions of Associations: Circumstantial  Orientation:  Full (Time, Place, and Person)  Thought Content:  Logical  Suicidal Thoughts:  No  Homicidal Thoughts:  No  Memory:  Recent;   Good  Judgement:  Good  Insight:  Good  Psychomotor Activity:  Normal  Concentration:  Good  Recall:  Good  Fund of Knowledge:Good  Language: Good  Akathisia:  Negative  Handed:  Right  AIMS (if indicated):     Assets:  Communication Skills Desire for Improvement  Sleep:   Number of Hours: 6  Cognition: WNL  ADL's:  Intact     Have you used any form of tobacco in the last 30 days? (Cigarettes, Smokeless Tobacco, Cigars, and/or Pipes): Yes  Has this patient used any form of tobacco in the last 30 days? (Cigarettes, Smokeless Tobacco, Cigars, and/or Pipes) Yes, No  Blood Alcohol level:  Lab Results  Component Value Date   ETH <10 05/26/2019   ETH <10 05/02/2019    Metabolic Disorder Labs:  Lab Results  Component Value Date   HGBA1C 6.6 (H) 01/16/2019   MPG 142.72 01/16/2019   No results found for: PROLACTIN Lab Results  Component Value Date   CHOL 152 01/16/2019   TRIG 107 01/16/2019   HDL 35 (L) 01/16/2019   CHOLHDL 4.3 01/16/2019   VLDL 21 01/16/2019   LDLCALC 96 01/16/2019   LDLCALC 96 12/05/2015    See Psychiatric Specialty Exam and Suicide Risk Assessment completed by Attending Physician prior to discharge.  Discharge destination:  Home  Is patient on multiple antipsychotic therapies at discharge:  No   Has Patient had three or more failed trials of antipsychotic monotherapy by history:  No  Recommended Plan for Multiple Antipsychotic Therapies: NA   Allergies as of 06/01/2019      Reactions   Pollen Extract Other (See Comments)   Sneezing      Medication List    STOP taking these medications   acetaminophen 500 MG tablet Commonly known as: TYLENOL     TAKE these medications     Indication  amLODipine 5 MG tablet Commonly known as: NORVASC Take 1 tablet (5 mg total) by mouth daily.  Indication: High Blood Pressure Disorder   aspirin 81 MG chewable tablet Chew 1 tablet (81 mg total) by mouth daily.  Indication: Pain   atorvastatin 40 MG tablet Commonly known as: LIPITOR Take 1 tablet (40 mg total) by mouth daily.  Indication: Cerebrovascular Accident or Stroke   benztropine 0.5 MG tablet Commonly known as: COGENTIN Take 1 tablet (0.5 mg total) by mouth 2 (two) times daily.  Indication: Extrapyramidal Reaction  caused by Medications   cyclobenzaprine 10 MG tablet Commonly known as: FLEXERIL Take 1 tablet (10 mg total) by mouth 2 (two) times daily as needed for muscle spasms.  Indication: Muscle Spasm   divalproex 250 MG 24 hr tablet Commonly known as: DEPAKOTE ER Take 3 tablets (750 mg total) by mouth  at bedtime.  Indication: Manic Phase of Manic-Depression   gabapentin 300 MG capsule Commonly known as: NEURONTIN Take 1 capsule (300 mg total) by mouth 2 (two) times daily.  Indication: Abuse or Misuse of Alcohol   haloperidol 5 MG tablet Commonly known as: HALDOL Take 3 tablets (15 mg total) by mouth at bedtime.  Indication: Psychosis   losartan 50 MG tablet Commonly known as: COZAAR Take 1 tablet (50 mg total) by mouth daily. What changed:   medication strength  how much to take  Indication: High Blood Pressure Disorder   nicotine 14 mg/24hr patch Commonly known as: NICODERM CQ - dosed in mg/24 hours Place 1 patch (14 mg total) onto the skin daily.  Indication: Nicotine Addiction   sennosides-docusate sodium 8.6-50 MG tablet Commonly known as: SENOKOT-S Take 2 tablets by mouth daily as needed for constipation.  Indication: Constipation   tamsulosin 0.4 MG Caps capsule Commonly known as: FLOMAX Take 0.4 mg by mouth daily.  Indication: Chronic Prostate Gland Inflammation   temazepam 15 MG capsule Commonly known as: RESTORIL Take 1 capsule (15 mg total) by mouth at bedtime.  Indication: Trouble Sleeping   ticagrelor 90 MG Tabs tablet Commonly known as: BRILINTA Take 1 tablet (90 mg total) by mouth 2 (two) times daily.  Indication: CORONARY ARTERY DISEASE      Follow-up Information    [facility] Follow up.   Contact information: [address] [place] [id number] [phone]          SignedMalvin Johns: Daymen Hassebrock, MD 06/01/2019, 8:17 AM

## 2019-06-01 NOTE — Progress Notes (Signed)
  [facility] :  Will you be returning to the same living situation after discharge:  Yes,  pt's apartment At discharge, do you have transportation home?: Yes,  pt's car is at [facility]; Patient will walk to his car Do you have the ability to pay for your medications: Yes,  medicare  Release of information consent forms completed and in the chart;  Patient's signature needed at discharge.  Patient to Follow up at: Follow-up Information    [facility] Follow up on 06/05/2019.   Why: Your hospital discharge appointment is Friday, August 7th at 1:30pm in person at [facility]. Please bring your ID, medication list, and hospital discharge paperwork with you.  Contact information: [address] [place] [address] [phone]           Next level of care provider has access to [place] and Suicide Prevention discussed: Yes,  pt's girlfriend  Have you used any form of tobacco in the last 30 days? (Cigarettes, Smokeless Tobacco, Cigars, and/or Pipes): Yes  Has patient been referred to the Quitline?: Patient refused referral  Patient has been referred for addiction treatment: Yes  Trecia Rogers, LCSW 06/01/2019, 10:51 AM

## 2019-06-01 NOTE — Progress Notes (Signed)
Patient ID: Bryan Davies, male   DOB: [birth year], 68 y.o.   MRN: [id number] D: Patient calm and cooperative on the unit. Pt reports he had a good day and tolerating medication well. Denies  SI/HI/AVH and pain.No behavioral issues noted.  A: Support and encouragement offered as needed to express needs. Medications administered as prescribed.  R: Patient is safe and cooperative on unit. Will continue to monitor  for safety and stability.

## 2019-06-01 NOTE — Progress Notes (Signed)
Recreation Therapy Notes  INPATIENT RECREATION TR PLAN  Patient Details Name: Bryan Davies MRN: [id number] DOB: 1950-11-06 Today's Date: 06/01/2019  Rec Therapy Plan Is patient appropriate for Therapeutic Recreation?: Yes Treatment times per week: about 3 days Estimated Length of Stay: 5-7 days TR Treatment/Interventions: Group participation (Comment)  Discharge Criteria Pt will be discharged from therapy if:: Discharged Treatment plan/goals/alternatives discussed and agreed upon by:: Patient/family  Discharge Summary Short term goals set: see patient care plan Short term goals met: Adequate for discharge Progress toward goals comments: Groups attended Which groups?: Communication, Leisure education, Goal setting Reason goals not met: n/a Therapeutic equipment acquired: none Reason patient discharged from therapy: Discharge from hospital Pt/family agrees with progress & goals achieved: Yes Date patient discharged from therapy: 06/01/19  Tomi Likens, LRT/CTRS  [name] 06/01/2019, 4:25 PM

## 2019-06-01 NOTE — Progress Notes (Signed)
Pt received both written and verbal discharge instructions. Pt verbalized understanding of discharge instructions. Pt agreed to f/u appt and med regimen. Pt received prescriptions, SRA, AVS, and a transitional record. Pt gathered belongings from room and locker. Pt safely discharged to the lobby and plans to walk to [facility] to get his car from the parking deck.

## 2019-06-01 NOTE — Tx Team (Signed)
Interdisciplinary Treatment and Diagnostic Plan Update  06/01/2019 Time of Session: 09:00am Bryan EnglishRoger D Davies MRN: [id number]  Principal Diagnosis: <principal problem not specified>  Secondary Diagnoses: Active Problems:   Bipolar 1 disorder (HCC)   Current Medications:  Current Facility-Administered Medications  Medication Dose Route Frequency Provider Last Rate Last Dose  . amLODipine (NORVASC) tablet 10 mg  10 mg Oral Daily Malvin JohnsFarah, Brian, MD   10 mg at 06/01/19 [id number]  . aspirin chewable tablet 81 mg  81 mg Oral Daily Rankin, Shuvon B, NP   81 mg at 06/01/19 0808  . atorvastatin (LIPITOR) tablet 40 mg  40 mg Oral Daily Rankin, Shuvon B, NP   40 mg at 06/01/19 0809  . benztropine (COGENTIN) tablet 0.5 mg  0.5 mg Oral BID PRN Antonieta Pertlary, Greg Lawson, MD      . divalproex (DEPAKOTE ER) 24 hr tablet 750 mg  750 mg Oral QHS Malvin JohnsFarah, Brian, MD   750 mg at 05/31/19 2039  . gabapentin (NEURONTIN) capsule 300 mg  300 mg Oral BID Rankin, Shuvon B, NP   300 mg at 06/01/19 0809  . haloperidol (HALDOL) tablet 10 mg  10 mg Oral QHS Malvin JohnsFarah, Brian, MD   10 mg at 05/31/19 2039  . haloperidol (HALDOL) tablet 5 mg  5 mg Oral QAC breakfast Malvin JohnsFarah, Brian, MD   5 mg at 06/01/19 0641  . hydrOXYzine (ATARAX/VISTARIL) tablet 25 mg  25 mg Oral TID PRN Nira ConnBerry, Jason A, NP      . losartan (COZAAR) tablet 50 mg  50 mg Oral Daily Malvin JohnsFarah, Brian, MD   50 mg at 06/01/19 0809  . nicotine (NICODERM CQ - dosed in mg/24 hours) patch 21 mg  21 mg Transdermal Daily Rankin, Shuvon B, NP      . senna-docusate (Senokot-S) tablet 2 tablet  2 tablet Oral BID PRN Rankin, Shuvon B, NP      . tamsulosin (FLOMAX) capsule 0.4 mg  0.4 mg Oral Daily Rankin, Shuvon B, NP   0.4 mg at 06/01/19 0808  . temazepam (RESTORIL) capsule 15 mg  15 mg Oral QHS Antonieta Pertlary, Greg Lawson, MD   15 mg at 05/31/19 2040  . ticagrelor (BRILINTA) tablet 90 mg  90 mg Oral BID Rankin, Shuvon B, NP   90 mg at 06/01/19 0848  . traZODone (DESYREL) tablet 50 mg  50 mg Oral QHS,MR X 1  Nira ConnBerry, Jason A, NP   50 mg at 05/31/19 2040   PTA Medications: Medications Prior to Admission  Medication Sig Dispense Refill Last Dose  . acetaminophen (TYLENOL) 500 MG tablet Take 500-1,000 mg by mouth every 6 (six) hours as needed for mild pain, moderate pain or headache.      Marland Kitchen. amLODipine (NORVASC) 5 MG tablet Take 1 tablet (5 mg total) by mouth daily. 90 tablet 0   . aspirin 81 MG chewable tablet Chew 1 tablet (81 mg total) by mouth daily.     Marland Kitchen. atorvastatin (LIPITOR) 40 MG tablet Take 1 tablet (40 mg total) by mouth daily. 30 tablet 11   . cyclobenzaprine (FLEXERIL) 10 MG tablet Take 1 tablet (10 mg total) by mouth 2 (two) times daily as needed for muscle spasms. (Patient not taking: Reported on 05/04/2019) 20 tablet 0   . gabapentin (NEURONTIN) 300 MG capsule Take 1 capsule (300 mg total) by mouth 2 (two) times daily. 60 capsule 11   . losartan (COZAAR) 25 MG tablet Take 25 mg by mouth daily.     . nicotine (NICODERM  CQ - DOSED IN MG/24 HOURS) 14 mg/24hr patch Place 1 patch (14 mg total) onto the skin daily. (Patient not taking: Reported on 05/04/2019) 28 patch 0   . sennosides-docusate sodium (SENOKOT-S) 8.6-50 MG tablet Take 2 tablets by mouth daily as needed for constipation.     . tamsulosin (FLOMAX) 0.4 MG CAPS capsule Take 0.4 mg by mouth daily.     . ticagrelor (BRILINTA) 90 MG TABS tablet Take 1 tablet (90 mg total) by mouth 2 (two) times daily. 60 tablet 11     Patient Stressors: Other: off medications  Patient Strengths: Motivation for treatment/growth Supportive family/friends  Treatment Modalities: Medication Management, Group therapy, Case management,  1 to 1 session with clinician, Psychoeducation, Recreational therapy.   Physician Treatment Plan for Primary Diagnosis: <principal problem not specified> Long Term Goal(s):     Short Term Goals:    Medication Management: Evaluate patient's response, side effects, and tolerance of medication regimen.  Therapeutic  Interventions: 1 to 1 sessions, Unit Group sessions and Medication administration.  Evaluation of Outcomes: Adequate for Discharge  Physician Treatment Plan for Secondary Diagnosis: Active Problems:   Bipolar 1 disorder ([name])  Long Term Goal(s):     Short Term Goals:       Medication Management: Evaluate patient's response, side effects, and tolerance of medication regimen.  Therapeutic Interventions: 1 to 1 sessions, Unit Group sessions and Medication administration.  Evaluation of Outcomes: Adequate for Discharge   RN Treatment Plan for Primary Diagnosis: <principal problem not specified> Long Term Goal(s): Knowledge of disease and therapeutic regimen to maintain health will improve  Short Term Goals: Ability to participate in decision making will improve, Ability to verbalize feelings will improve, Ability to disclose and discuss suicidal ideas, Ability to identify and develop effective coping behaviors will improve and Compliance with prescribed medications will improve  Medication Management: RN will administer medications as ordered by provider, will assess and evaluate patient's response and provide education to patient for prescribed medication. RN will report any adverse and/or side effects to prescribing provider.  Therapeutic Interventions: 1 on 1 counseling sessions, Psychoeducation, Medication administration, Evaluate responses to treatment, Monitor vital signs and CBGs as ordered, Perform/monitor CIWA, COWS, AIMS and Fall Risk screenings as ordered, Perform wound care treatments as ordered.  Evaluation of Outcomes: Adequate for Discharge   LCSW Treatment Plan for Primary Diagnosis: <principal problem not specified> Long Term Goal(s): Safe transition to appropriate next level of care at discharge, Engage patient in therapeutic group addressing interpersonal concerns.  Short Term Goals: Engage patient in aftercare planning with referrals and resources and Increase skills  for wellness and recovery  Therapeutic Interventions: Assess for all discharge needs, 1 to 1 time with Social worker, Explore available resources and support systems, Assess for adequacy in community support network, Educate family and significant other(s) on suicide prevention, Complete Psychosocial Assessment, Interpersonal group therapy.  Evaluation of Outcomes: Adequate for Discharge   Progress in Treatment: Attending groups: Yes. Participating in groups: Yes. Taking medication as prescribed: Yes. Toleration medication: Yes. Family/Significant other contact made: Yes, individual(s) contacted:  pt's girlfriend Patient understands diagnosis: No. Discussing patient identified problems/goals with staff: Yes. Medical problems stabilized or resolved: Yes. Denies suicidal/homicidal ideation: Yes. Issues/concerns per patient self-inventory: No. Other:   New problem(s) identified: No, Describe:  None  New Short Term/Long Term Goal(s): Medication stabilization, elimination of SI thoughts, and development of a comprehensive mental wellness plan.   Patient Goals:    Discharge Plan or Barriers: Patient will be  discharging home today and will be continuing aftercare services with [facility].  Reason for Continuation of Hospitalization: Patient is discharging today.   Estimated Length of Stay: Patient is discharging today.   Attendees: Patient: 06/01/2019   Physician: Dr. Malvin JohnsBrian Farah, MD 06/01/2019   Nursing: Rodell PernaPatrice, RN 06/01/2019   RN Care Manager: 06/01/2019   Social Worker: Stephannie PetersJasmine Mckena Chern, LCSW 06/01/2019   Recreational Therapist:  06/01/2019   Other:  06/01/2019   Other:  06/01/2019   Other: 06/01/2019     Scribe for Treatment Team: Delphia GratesJasmine M Denzal Meir, LCSW 06/01/2019 9:47 AM

## 2019-06-01 NOTE — [facility] Suicide Risk Assessment (Signed)
[facility] Discharge Suicide Risk Assessment   Principal Problem: Exacerbation of underlying bipolar type condition Discharge Diagnoses: Active Problems:   Bipolar 1 disorder (HCC)   Total Time spent with patient: 45 minutes  Musculoskeletal: Strength & Muscle Tone: within normal limits Gait & Station: normal Patient leans: N/A  Psychiatric Specialty Exam: ROS  Blood pressure 107/67, pulse 96, temperature 98.7 F (37.1 C), temperature source Oral, resp. rate 16, SpO2 98 %.There is no height or weight on file to calculate BMI.  General Appearance: Casual  Eye Contact::  Good  Speech:  Clear and Coherent409  Volume:  Normal  Mood:  Euthymic  Affect: Congruent  Thought Process:  Coherent, Goal Directed and Descriptions of Associations: Circumstantial  Orientation:  Full (Time, Place, and Person)  Thought Content:  Logical  Suicidal Thoughts:  No  Homicidal Thoughts:  No  Memory:  Recent;   Good  Judgement:  Good  Insight:  Good  Psychomotor Activity:  Normal  Concentration:  Good  Recall:  Good  Fund of Knowledge:Good  Language: Good  Akathisia:  Negative  Handed:  Right  AIMS (if indicated):     Assets:  Communication Skills Desire for Improvement  Sleep:  Number of Hours: 6  Cognition: WNL  ADL's:  Intact   Mental Status Per Nursing Assessment::   On Admission:  NA  Demographic Factors:  Caucasian  Loss Factors: Decline in physical health  Historical Factors: NA  Risk Reduction Factors:   Sense of responsibility to family and Religious beliefs about death  Continued Clinical Symptoms:  Medical Diagnoses and Treatments/Surgeries  Cognitive Features That Contribute To Risk:  None    Suicide Risk:  Minimal: No identifiable suicidal ideation.  Patients presenting with no risk factors but with morbid ruminations; may be classified as minimal risk based on the severity of the depressive symptoms  Follow-up Information    [facility] Follow up.   Contact  information: [address] [place] [address] [phone]           Plan Of Care/Follow-up recommendations:  Activity:  full  Bryan Ontiveros, MD 06/01/2019, 7:54 AM

## 2019-06-05 ENCOUNTER — Encounter: Payer: Self-pay | Admitting: Family Medicine

## 2019-06-05 ENCOUNTER — Telehealth: Payer: Self-pay | Admitting: *Deleted

## 2019-06-05 NOTE — Telephone Encounter (Signed)
-----   Message from Martyn Malay, MD sent at 06/05/2019  1:52 PM EDT ----- Regarding: Follow up [facility] Team,  Please schedule follow up in next 1-2 months for weight loss with me.   Thanks! Dr. Michaelene Song

## 2019-06-05 NOTE — Telephone Encounter (Signed)
No schedule for Dr. Owens Shark for Sept/Oct will wait till it opens up. Bryan Davies, CMA

## 2019-06-08 NOTE — Telephone Encounter (Addendum)
LVM on home phone to call office back to see about scheduling him an appointment per the note below.  If he calls back please assist him in getting this scheduled. This appointment cant be with Dr. Posey Pronto per Dr. Owens Shark.  Also tried mobile and phone stated call could not be completed at this time. April Zimmerman Rumple, CMA

## 2019-06-11 ENCOUNTER — Encounter: Payer: Self-pay | Admitting: Family Medicine

## 2019-06-11 NOTE — Telephone Encounter (Signed)
LVM for pt to call office to assist him in getting an appointment scheduled. See notes below.  I will also mail a letter asking him to contact us since he has not been available and has not responded to his MyChart either. Bionca Mckey Zimmerman Rumple, CMA

## 2019-06-17 ENCOUNTER — Other Ambulatory Visit: Payer: Self-pay

## 2019-06-17 ENCOUNTER — Encounter (HOSPITAL_COMMUNITY): Payer: Self-pay | Admitting: Emergency Medicine

## 2019-06-17 ENCOUNTER — Emergency Department (HOSPITAL_COMMUNITY)
Admission: EM | Admit: 2019-06-17 | Discharge: 2019-06-17 | Disposition: A | Discharge status: Home / Self Care | Payer: Medicare Other | Attending: Emergency Medicine | Admitting: Emergency Medicine

## 2019-06-17 DIAGNOSIS — Z7982 Long term (current) use of aspirin: Secondary | ICD-10-CM | POA: Diagnosis not present

## 2019-06-17 DIAGNOSIS — F17210 Nicotine dependence, cigarettes, uncomplicated: Secondary | ICD-10-CM | POA: Diagnosis not present

## 2019-06-17 DIAGNOSIS — Z79899 Other long term (current) drug therapy: Secondary | ICD-10-CM | POA: Insufficient documentation

## 2019-06-17 DIAGNOSIS — W4904XA Ring or other jewelry causing external constriction, initial encounter: Secondary | ICD-10-CM | POA: Insufficient documentation

## 2019-06-17 DIAGNOSIS — Y939 Activity, unspecified: Secondary | ICD-10-CM | POA: Insufficient documentation

## 2019-06-17 DIAGNOSIS — S60445A External constriction of left ring finger, initial encounter: Secondary | ICD-10-CM | POA: Insufficient documentation

## 2019-06-17 DIAGNOSIS — I10 Essential (primary) hypertension: Secondary | ICD-10-CM | POA: Insufficient documentation

## 2019-06-17 DIAGNOSIS — Y929 Unspecified place or not applicable: Secondary | ICD-10-CM | POA: Insufficient documentation

## 2019-06-17 DIAGNOSIS — S60449A External constriction of unspecified finger, initial encounter: Secondary | ICD-10-CM

## 2019-06-17 DIAGNOSIS — Y999 Unspecified external cause status: Secondary | ICD-10-CM | POA: Insufficient documentation

## 2019-06-17 NOTE — ED Notes (Signed)
Ring cut off and removed from Finger

## 2019-06-17 NOTE — Discharge Instructions (Addendum)
Do not put the rings back on your finger until the swelling has completely resolved

## 2019-06-17 NOTE — ED Triage Notes (Signed)
Pt here wanting his ring either cut off or just off his ring finger on his left hand

## 2019-06-17 NOTE — ED Provider Notes (Signed)
[place] EMERGENCY DEPARTMENT Provider Note   CSN: [id number] Arrival date & time: 06/17/19  1415    History   Chief Complaint No chief complaint on file.   HPI Bryan Davies is a 68 y.o. male.     HPI   68 year old male presents today with ring stuck on his left ring finger.  Patient notes he noticed swelling today, no trauma to the finger.  He was unable to remove the ring by himself.  He reports he is ambidextrous.   Past Medical History:  Diagnosis Date  . Anxiety   . Hypertension   . Shingles 2013  . Shoulder pain     Patient Active Problem List   Diagnosis Date Noted  . Bipolar 1 disorder ([place]) 05/27/2019  . 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 Ischemic stroke ([place]) 01/15/2019  . Left shoulder pain 12/05/2015  . Urinary hesitancy 12/05/2015  . Pain in lower jaw 07/11/2015  . Hyperlipidemia 02/11/2014  . 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 Dyshydrosis 07/26/2013  . 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 Hypertension 05/21/2012  . Post herpetic neuralgia 05/21/2012  . Bipolar disorder ([place]) 05/21/2012  . Tobacco abuse 05/21/2012    Past Surgical History:  Procedure Laterality Date  . IR ANGIO VERTEBRAL SEL SUBCLAVIAN INNOMINATE UNI R MOD SED  01/16/2019  . IR CT HEAD LTD  01/16/2019  . IR INTRAVSC STENT CERV CAROTID W/O EMB-PROT MOD SED INC ANGIO  01/16/2019  . IR PERCUTANEOUS ART THROMBECTOMY/INFUSION INTRACRANIAL INC DIAG ANGIO  01/16/2019  . RADIOLOGY WITH ANESTHESIA N/A 01/15/2019   Procedure: IR WITH ANESTHESIA;  Surgeon: Luanne Bras, MD;  Location: [place];  Service: Radiology;  Laterality: N/A;        Home Medications    Prior to Admission medications   Medication Sig Start Date End Date Taking? Authorizing Provider  amLODipine (NORVASC) 5 MG tablet Take 1 tablet (5 mg total) by mouth daily. 07/25/16   Glenis Smoker, MD  aspirin 81 MG chewable tablet Chew 1 tablet (81 mg total) by mouth  daily. 01/18/19   Rinehuls, Early Chars, PA-C  atorvastatin (LIPITOR) 40 MG tablet Take 1 tablet (40 mg total) by mouth daily. 01/18/19   Rinehuls, Early Chars, PA-C  benztropine (COGENTIN) 0.5 MG tablet Take 1 tablet (0.5 mg total) by mouth 2 (two) times daily. 06/01/19   Johnn Hai, MD  cyclobenzaprine (FLEXERIL) 10 MG tablet Take 1 tablet (10 mg total) by mouth 2 (two) times daily as needed for muscle spasms. Patient not taking: Reported on 05/04/2019 01/18/19   Rinehuls, Early Chars, PA-C  divalproex (DEPAKOTE ER) 250 MG 24 hr tablet Take 3 tablets (750 mg total) by mouth at bedtime. 06/01/19   Johnn Hai, MD  gabapentin (NEURONTIN) 300 MG capsule Take 1 capsule (300 mg total) by mouth 2 (two) times daily. 01/18/19   Rinehuls, Early Chars, PA-C  haloperidol (HALDOL) 5 MG tablet Take 3 tablets (15 mg total) by mouth at bedtime. 06/01/19   Johnn Hai, MD  losartan (COZAAR) 50 MG tablet Take 1 tablet (50 mg total) by mouth daily. 06/01/19   Johnn Hai, MD  nicotine (NICODERM CQ - DOSED IN MG/24 HOURS) 14 mg/24hr patch Place 1 patch (14 mg total) onto the skin daily. Patient not taking: Reported on 05/04/2019 01/18/19   Rinehuls, Early Chars, PA-C  sennosides-docusate sodium (SENOKOT-S) 8.6-50 MG tablet Take 2 tablets by mouth daily as needed for constipation.    [provider]  tamsulosin (FLOMAX) 0.4 MG CAPS capsule Take 0.4 mg by mouth daily. 03/27/19   [provider]  temazepam (RESTORIL) 15 MG capsule Take 1 capsule (15 mg total) by mouth at bedtime. 06/01/19   Malvin JohnsFarah, Brian, MD  ticagrelor (BRILINTA) 90 MG TABS tablet Take 1 tablet (90 mg total) by mouth 2 (two) times daily. 01/18/19   Rinehuls, Kinnie Scalesavid L, PA-C    Family History History reviewed. No pertinent family history.  Social History Social History   Tobacco Use  . Smoking status: Current Every Day Smoker    Packs/day: 1.00    Years: 50.00    Pack years: 50.00    Types: Cigarettes  . Smokeless tobacco: Never Used  . Tobacco comment: 10   cigarettes per day, waits an hour between  Substance Use Topics  . Alcohol use: No  . Drug use: No     Allergies   Pollen extract   Review of Systems Review of Systems  All other systems reviewed and are negative.    Physical Exam Updated Vital Signs BP 135/73 (BP Location: Right Arm)   Pulse (!) 102   Resp 14   SpO2 97%   Physical Exam Vitals signs and nursing note reviewed.  Constitutional:      Appearance: He is well-developed.  HENT:     Head: Normocephalic and atraumatic.  Eyes:     General: No scleral icterus.       Right eye: No discharge.        Left eye: No discharge.     Conjunctiva/sclera: Conjunctivae normal.     Pupils: Pupils are equal, round, and reactive to light.  Neck:     Musculoskeletal: Normal range of motion.     Vascular: No JVD.     Trachea: No tracheal deviation.  Pulmonary:     Effort: Pulmonary effort is normal.     Breath sounds: No stridor.  Musculoskeletal:     Comments: Swelling to left ring finger with 3 separate rings on it  Neurological:     Mental Status: He is alert and oriented to person, place, and time.     Coordination: Coordination normal.  Psychiatric:        Behavior: Behavior normal.        Thought Content: Thought content normal.        Judgment: Judgment normal.      ED Treatments / Results  Labs (all labs ordered are listed, but only abnormal results are displayed) Labs Reviewed - No data to display  EKG None  Radiology No results found.  Procedures Procedures (including critical care time)  Medications Ordered in ED Medications - No data to display   Initial Impression / Assessment and Plan / ED Course  I have reviewed the triage vital signs and the nursing notes.  Pertinent labs & imaging results that were available during my care of the patient were reviewed by me and considered in my medical decision making (see chart for details).       Final Clinical Impressions(s) / ED Diagnoses    Final diagnoses:  Tight ring on finger   68 year old male with a ring stuck on his left finger.  This was removed with the ring cutter here.  He does have a small soft tissue pressure injury.  All rings were removed, full active range of motion at all joints.  Patient encouraged to not put the rings back on.  Return if he develops any new or worsening signs or  symptoms.  Sensation intact status post procedure.  ED Discharge Orders    None       Eyvonne MechanicHedges, Amaya Blakeman, Cordelia Poche-C 06/17/19 1507    Gwyneth SproutPlunkett, Whitney, MD 06/18/19 1755

## 2019-08-09 ENCOUNTER — Ambulatory Visit (HOSPITAL_COMMUNITY)
Admission: EM | Admit: 2019-08-09 | Discharge: 2019-08-09 | Disposition: A | Discharge status: Home / Self Care | Payer: Medicare Other | Attending: Family Medicine | Admitting: Family Medicine

## 2019-08-09 ENCOUNTER — Encounter (HOSPITAL_COMMUNITY): Payer: Self-pay

## 2019-08-09 DIAGNOSIS — M25512 Pain in left shoulder: Secondary | ICD-10-CM

## 2019-08-09 MED ORDER — MELOXICAM 7.5 MG PO TABS: 7.5000 mg | ORAL_TABLET | Freq: Every day | ORAL | 0 refills | Status: DC

## 2019-08-09 NOTE — ED Triage Notes (Signed)
Pt present pain in both arms. Pt states that symptoms started two weeks ago after he received his month haldol injection. The pain went from the right arm to the left arm after  Receiving his injection.

## 2019-08-09 NOTE — Discharge Instructions (Signed)
Treating you for arthritis.  Take the meloxicam 1 time a day with food Follow-up with your doctor as needed

## 2019-08-11 NOTE — ED Provider Notes (Signed)
[facility]    CSN: [id number] Arrival date & time: 08/09/19  1352      History   Chief Complaint Chief Complaint  Patient presents with  . Arm Pain    Both    HPI Bryan Davies is a 68 y.o. male.   Pt is a 68 year old male with past medical history of anxiety, hypertension, shingles, shoulder pain, bipolar, ischemic stroke. He presents today with left shoulder pain.  This is a chronic issue for him.  Symptoms have been constant, waxing and waning.  Worse with movement.  He has not taken anything for the pain.  No associated numbness, tingling, weakness in extremity, chest pain or shortness of breath. No falls or inuries.   ROS per HPI    Arm Pain    Past Medical History:  Diagnosis Date  . Anxiety   . Hypertension   . Shingles 2013  . Shoulder pain     Patient Active Problem List   Diagnosis Date Noted  . Bipolar 1 disorder (HCC) 05/27/2019  . 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 Ischemic stroke (HCC) 01/15/2019  . Left shoulder pain 12/05/2015  . Urinary hesitancy 12/05/2015  . Pain in lower jaw 07/11/2015  . Hyperlipidemia 02/11/2014  . 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 Dyshydrosis 07/26/2013  . 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 Hypertension 05/21/2012  . Post herpetic neuralgia 05/21/2012  . Bipolar disorder (HCC) 05/21/2012  . Tobacco abuse 05/21/2012    Past Surgical History:  Procedure Laterality Date  . IR ANGIO VERTEBRAL SEL SUBCLAVIAN INNOMINATE UNI R MOD SED  01/16/2019  . IR CT HEAD LTD  01/16/2019  . IR INTRAVSC STENT CERV CAROTID W/O EMB-PROT MOD SED INC ANGIO  01/16/2019  . IR PERCUTANEOUS ART THROMBECTOMY/INFUSION INTRACRANIAL INC DIAG ANGIO  01/16/2019  . RADIOLOGY WITH ANESTHESIA N/A 01/15/2019   Procedure: IR WITH ANESTHESIA;  Surgeon: Julieanne Cottoneveshwar, Sanjeev, MD;  Location: [facility];  Service: Radiology;  Laterality: N/A;       Home Medications    Prior to Admission medications   Medication Sig  Start Date End Date Taking? Authorizing Provider  amLODipine (NORVASC) 5 MG tablet Take 1 tablet (5 mg total) by mouth daily. 07/25/16   Shon Haleimberlake, Kathryn S, MD  aspirin 81 MG chewable tablet Chew 1 tablet (81 mg total) by mouth daily. 01/18/19   Rinehuls, Kinnie Scalesavid L, PA-C  atorvastatin (LIPITOR) 40 MG tablet Take 1 tablet (40 mg total) by mouth daily. 01/18/19   Rinehuls, Kinnie Scalesavid L, PA-C  benztropine (COGENTIN) 0.5 MG tablet Take 1 tablet (0.5 mg total) by mouth 2 (two) times daily. 06/01/19   Malvin JohnsFarah, Brian, MD  cyclobenzaprine (FLEXERIL) 10 MG tablet Take 1 tablet (10 mg total) by mouth 2 (two) times daily as needed for muscle spasms. Patient not taking: Reported on 05/04/2019 01/18/19   Rinehuls, Kinnie ScalesDavid L, PA-C  divalproex (DEPAKOTE ER) 250 MG 24 hr tablet Take 3 tablets (750 mg total) by mouth at bedtime. 06/01/19   Malvin JohnsFarah, Brian, MD  gabapentin (NEURONTIN) 300 MG capsule Take 1 capsule (300 mg total) by mouth 2 (two) times daily. 01/18/19   Rinehuls, Kinnie Scalesavid L, PA-C  haloperidol (HALDOL) 5 MG tablet Take 3 tablets (15 mg total) by mouth at bedtime. 06/01/19   Malvin JohnsFarah, Brian, MD  losartan (COZAAR) 50 MG tablet Take 1 tablet (50 mg total) by mouth daily. 06/01/19   Malvin JohnsFarah, Brian, MD  meloxicam (MOBIC) 7.5 MG tablet Take 1 tablet (7.5 mg  total) by mouth daily. 08/09/19   Deonna Krummel, Tressia Miners A, NP  nicotine (NICODERM CQ - DOSED IN MG/24 HOURS) 14 mg/24hr patch Place 1 patch (14 mg total) onto the skin daily. Patient not taking: Reported on 05/04/2019 01/18/19   Rinehuls, Early Chars, PA-C  sennosides-docusate sodium (SENOKOT-S) 8.6-50 MG tablet Take 2 tablets by mouth daily as needed for constipation.    [provider]  tamsulosin (FLOMAX) 0.4 MG CAPS capsule Take 0.4 mg by mouth daily. 03/27/19   [provider]  temazepam (RESTORIL) 15 MG capsule Take 1 capsule (15 mg total) by mouth at bedtime. 06/01/19   Johnn Hai, MD  ticagrelor (BRILINTA) 90 MG TABS tablet Take 1 tablet (90 mg total) by mouth 2 (two) times daily.  01/18/19   Rinehuls, Early Chars PA-C    Family History History reviewed. No pertinent family history.  Social History Social History   Tobacco Use  . Smoking status: Current Every Day Smoker    Packs/day: 1.00    Years: 50.00    Pack years: 50.00    Types: Cigarettes  . Smokeless tobacco: Never Used  . Tobacco comment: 10  cigarettes per day, waits an hour between  Substance Use Topics  . Alcohol use: No  . Drug use: No     Allergies   Pollen extract   Review of Systems Review of Systems   Physical Exam Triage Vital Signs ED Triage Vitals  Enc Vitals Group     BP 08/09/19 1420 (!) 154/68     Pulse Rate 08/09/19 1420 86     Resp 08/09/19 1420 16     Temp 08/09/19 1420 (!) 97.5 F (36.4 C)     Temp Source 08/09/19 1420 Oral     SpO2 08/09/19 1420 97 %     Weight --      Height --      Head Circumference --      Peak Flow --      Pain Score 08/09/19 1421 7     Pain Loc --      Pain Edu? --      Excl. in [place]? --    No data found.  Updated Vital Signs BP (!) 154/68 (BP Location: Left Arm)   Pulse 86   Temp (!) 97.5 F (36.4 C) (Oral)   Resp 16   SpO2 97%   Visual Acuity Right Eye Distance:   Left Eye Distance:   Bilateral Distance:    Right Eye Near:   Left Eye Near:    Bilateral Near:     Physical Exam Vitals signs and nursing note reviewed.  Constitutional:      Appearance: Normal appearance.  HENT:     Head: Normocephalic and atraumatic.     Nose: Nose normal.  Eyes:     Conjunctiva/sclera: Conjunctivae normal.  Neck:     Musculoskeletal: Normal range of motion.  Pulmonary:     Effort: Pulmonary effort is normal.  Musculoskeletal: Normal range of motion.     Comments: Generalized tenderness to the shoulder. Limited ROM.  No swelling, deformity.   Skin:    General: Skin is warm and dry.  Neurological:     Mental Status: He is alert.  Psychiatric:        Mood and Affect: Mood normal.      UC Treatments / Results  Labs (all labs  ordered are listed, but only abnormal results are displayed) Labs Reviewed - No data to display  EKG  Radiology No results found.  Procedures Procedures (including critical care time)  Medications Ordered in UC Medications - No data to display  Initial Impression / Assessment and Plan / UC Course  I have reviewed the triage vital signs and the nursing notes.  Pertinent labs & imaging results that were available during my care of the patient were reviewed by me and considered in my medical decision making (see chart for details).     Chronic shoulder pain most likely due to arthritis.  Will treat with meloxicam daily.  He can take this with food. Follow up as needed for continued or worsening symptoms  Final Clinical Impressions(s) / UC Diagnoses   Final diagnoses:  Pain in joint of left shoulder     Discharge Instructions     Treating you for arthritis.  Take the meloxicam 1 time a day with food Follow-up with your doctor as needed    ED Prescriptions    Medication Sig Dispense Auth. Provider   meloxicam (MOBIC) 7.5 MG tablet Take 1 tablet (7.5 mg total) by mouth daily. 15 tablet Jenna Ardoin A, NP     PDMP not reviewed this encounter.   Dahlia Byes A, NP 08/11/19 0830

## 2019-08-12 ENCOUNTER — Emergency Department (HOSPITAL_COMMUNITY)
Admission: EM | Admit: 2019-08-12 | Discharge: 2019-08-13 | Disposition: A | Discharge status: Home / Self Care | Payer: Medicare Other | Attending: Emergency Medicine | Admitting: Emergency Medicine

## 2019-08-12 ENCOUNTER — Other Ambulatory Visit: Payer: Self-pay

## 2019-08-12 ENCOUNTER — Encounter (HOSPITAL_COMMUNITY): Payer: Self-pay | Admitting: Emergency Medicine

## 2019-08-12 DIAGNOSIS — Z7982 Long term (current) use of aspirin: Secondary | ICD-10-CM | POA: Insufficient documentation

## 2019-08-12 DIAGNOSIS — Z8673 Personal history of transient ischemic attack (TIA), and cerebral infarction without residual deficits: Secondary | ICD-10-CM | POA: Diagnosis not present

## 2019-08-12 DIAGNOSIS — Z79899 Other long term (current) drug therapy: Secondary | ICD-10-CM | POA: Diagnosis not present

## 2019-08-12 DIAGNOSIS — M25512 Pain in left shoulder: Secondary | ICD-10-CM

## 2019-08-12 DIAGNOSIS — B309 Viral conjunctivitis, unspecified: Secondary | ICD-10-CM | POA: Diagnosis not present

## 2019-08-12 DIAGNOSIS — I10 Essential (primary) hypertension: Secondary | ICD-10-CM | POA: Insufficient documentation

## 2019-08-12 DIAGNOSIS — F17210 Nicotine dependence, cigarettes, uncomplicated: Secondary | ICD-10-CM | POA: Diagnosis not present

## 2019-08-12 LAB — CBG MONITORING, ED: Glucose-Capillary: 176 mg/dL — ABNORMAL HIGH (ref 70–99)

## 2019-08-12 LAB — DIFFERENTIAL
Abs Immature Granulocytes: 0.21 10*3/uL — ABNORMAL HIGH (ref 0.00–0.07)
Basophils Absolute: 0 10*3/uL (ref 0.0–0.1)
Basophils Relative: 0 %
Eosinophils Absolute: 0.1 10*3/uL (ref 0.0–0.5)
Eosinophils Relative: 1 %
Immature Granulocytes: 3 %
Lymphocytes Relative: 9 %
Lymphs Abs: 0.7 10*3/uL (ref 0.7–4.0)
Monocytes Absolute: 0.5 10*3/uL (ref 0.1–1.0)
Monocytes Relative: 6 %
Neutro Abs: 6.5 10*3/uL (ref 1.7–7.7)
Neutrophils Relative %: 81 %

## 2019-08-12 LAB — COMPREHENSIVE METABOLIC PANEL
ALT: 13 U/L (ref 0–44)
AST: 15 U/L (ref 15–41)
Albumin: 3.6 g/dL (ref 3.5–5.0)
Alkaline Phosphatase: 59 U/L (ref 38–126)
Anion gap: 11 (ref 5–15)
BUN: 26 mg/dL — ABNORMAL HIGH (ref 8–23)
CO2: 22 mmol/L (ref 22–32)
Calcium: 8.9 mg/dL (ref 8.9–10.3)
Chloride: 108 mmol/L (ref 98–111)
Creatinine, Ser: 0.99 mg/dL (ref 0.61–1.24)
GFR calc Af Amer: >60 mL/min (ref >60)
GFR calc non Af Amer: >60 mL/min (ref >60)
Glucose, Bld: 173 mg/dL — ABNORMAL HIGH (ref 70–99)
Potassium: 4.3 mmol/L (ref 3.5–5.1)
Sodium: 141 mmol/L (ref 135–145)
Total Bilirubin: 0.7 mg/dL (ref 0.3–1.2)
Total Protein: 6.8 g/dL (ref 6.5–8.1)

## 2019-08-12 LAB — CBC
HCT: 43.9 % (ref 39.0–52.0)
Hemoglobin: 14.9 g/dL (ref 13.0–17.0)
MCH: 31.1 pg (ref 26.0–34.0)
MCHC: 33.9 g/dL (ref 30.0–36.0)
MCV: 91.6 fL (ref 80.0–100.0)
Platelets: 183 10*3/uL (ref 150–400)
RBC: 4.79 MIL/uL (ref 4.22–5.81)
RDW: 13.4 % (ref 11.5–15.5)
WBC: 8.1 10*3/uL (ref 4.0–10.5)
nRBC: 0 % (ref 0.0–0.2)

## 2019-08-12 LAB — APTT: aPTT: 27 seconds (ref 24–36)

## 2019-08-12 MED ORDER — FLUORESCEIN SODIUM 1 MG OP STRP
1.0000 | ORAL_STRIP | Freq: Once | OPHTHALMIC | Status: AC
  Administered 2019-08-12: 22:00:00 1 via OPHTHALMIC
  Filled 2019-08-12: qty 1

## 2019-08-12 MED ORDER — NAPHAZOLINE-PHENIRAMINE 0.025-0.3 % OP SOLN: 2.0000 [drp] | Freq: Four times a day (QID) | OPHTHALMIC | 0 refills | Status: DC | PRN

## 2019-08-12 MED ORDER — TETRACAINE HCL 0.5 % OP SOLN
2.0000 [drp] | Freq: Once | OPHTHALMIC | Status: AC
  Administered 2019-08-12: 2 [drp] via OPHTHALMIC
  Filled 2019-08-12: qty 4

## 2019-08-12 NOTE — ED Notes (Signed)
Pt unhooked himself from cardiac monitor and changed back into his clothes. RN informed.

## 2019-08-12 NOTE — Discharge Instructions (Addendum)
Start using eyedrops for your conjunctivitis.  Wash your hands frequently.  Do not use a tissue on the left eye then use it on the right eye.  Your symptoms will likely spread to the other eye.  Follow-up with ophthalmology in the next 48 hours for reevaluation.  Return to the emergency department if any concerning signs or symptoms develop such as vision loss, severe pain or swelling.  Take 1 to 2 tablets of Tylenol every 6 hours as needed for pain in the shoulder.  You can wear the shoulder sling for comfort but make sure to do gentle stretching exercises (see attached) to avoid muscle stiffness.  Can also apply heat or ice whichever feels best 20 minutes at a time 3-4 times daily as needed.  Follow-up with your PCP or an orthopedist for reevaluation of your shoulder pain.  Return to the emergency department if any concerning signs or symptoms develop such as weakness, loss of pulses, severe pain.

## 2019-08-12 NOTE — ED Notes (Signed)
Explained wait to pt again.

## 2019-08-12 NOTE — ED Triage Notes (Signed)
Pt presents with finance at bedside who reports the pt has had increased difficulty ambulating, he also had facial redness. Pt reporting generalized pain. A/O x4 at triage. He has been non compliant with ALL medications for a month.

## 2019-08-12 NOTE — ED Notes (Signed)
WOOD LAMP AT BEDSIDE.

## 2019-08-12 NOTE — ED Notes (Signed)
Explained wait to pt.

## 2019-08-12 NOTE — ED Provider Notes (Signed)
[facility] Provider Note   CSN: [id number] Arrival date & time: 08/12/19  1305     History   Chief Complaint Chief Complaint  Patient presents with   Difficulty Walking    HPI Bryan Davies is a 68 y.o. 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  Triage note makes mention that the patient told his fiance that he had difficulty ambulating.  He goes on to tell me that he experienced this this morning upon awakening.  He states that he attempted to go to the bathroom when his fiance called him so he told her that he was having difficulty ambulating.  He tells me that this is not unusual for him when he takes his night medications and has residual drowsiness in the mornings.  He tells me that his symptoms have entirely resolved and were not similar to the stroke he experienced in May.  He denies any numbness or weakness of his extremities, headaches, vision changes, dizziness, or slurred speech.  He does tell me that when he awoke this morning he noted some left eye redness and itchiness.  He denies any trauma to the eye, does not wear contact lenses.  Again denies vision changes.  He has not tried anything for his symptoms.  His fiance tells me that she had similar symptoms but they have since resolved.  He is also complaining of 2 weeks of left shoulder pain, worse with abduction and upward motion/flexion. No known trauma. Pain does not radiate. He tells me he has been experiencing similar pains intermittently for some time and has seen an orthopedist and his PCP who have given steroid injections with improvement.  He took Tylenol for his pain today with improvement in his symptoms.  Again denies numbness or weakness of the extremities.  No fevers.  He tells me that he has tried  to be compliant with all of his home medications.  He is a current smoker.     The history is provided by the patient and the spouse.    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   Hypertension 05/21/2012   Post herpetic neuralgia 05/21/2012   Bipolar disorder ([place]) 05/21/2012   Tobacco abuse 05/21/2012    Past Surgical History:  Procedure Laterality Date   IR ANGIO VERTEBRAL SEL SUBCLAVIAN INNOMINATE UNI R MOD SED  01/16/2019   IR CT HEAD LTD  01/16/2019   IR INTRAVSC STENT CERV CAROTID W/O EMB-PROT MOD SED INC ANGIO  01/16/2019   IR PERCUTANEOUS ART THROMBECTOMY/INFUSION INTRACRANIAL INC DIAG ANGIO  01/16/2019   RADIOLOGY WITH ANESTHESIA N/A 01/15/2019   Procedure: IR WITH ANESTHESIA;  Surgeon: Luanne Bras, MD;  Location: [facility];  Service: Radiology;  Laterality: N/A;        Home Medications    Prior to Admission medications   Medication Sig  Start Date End Date Taking? Authorizing Provider  aspirin 81 MG chewable tablet Chew 1 tablet (81 mg total) by mouth daily. 01/18/19  Yes Rinehuls, Kinnie Scalesavid L, PA-C  atorvastatin (LIPITOR) 40 MG tablet Take 1 tablet (40 mg total) by mouth daily. 01/18/19  Yes Rinehuls, Kinnie Scalesavid L, PA-C  benztropine (COGENTIN) 0.5 MG tablet Take 1 tablet (0.5 mg total) by mouth 2 (two) times daily. 06/01/19  Yes Malvin JohnsFarah, Brian, MD  divalproex (DEPAKOTE ER) 250 MG 24 hr tablet Take 3 tablets (750 mg total) by mouth at bedtime. 06/01/19  Yes Malvin JohnsFarah, Brian, MD  gabapentin  (NEURONTIN) 300 MG capsule Take 1 capsule (300 mg total) by mouth 2 (two) times daily. 01/18/19  Yes Rinehuls, Kinnie Scalesavid L, PA-C  losartan (COZAAR) 50 MG tablet Take 1 tablet (50 mg total) by mouth daily. 06/01/19  Yes Malvin JohnsFarah, Brian, MD  tamsulosin (FLOMAX) 0.4 MG CAPS capsule Take 0.4 mg by mouth daily. 03/27/19  Yes [provider]  ticagrelor (BRILINTA) 90 MG TABS tablet Take 1 tablet (90 mg total) by mouth 2 (two) times daily. 01/18/19  Yes Rinehuls, Kinnie Scalesavid L, PA-C  amLODipine (NORVASC) 5 MG tablet Take 1 tablet (5 mg total) by mouth daily. 07/25/16   Shon Haleimberlake, Kathryn S, MD  cyclobenzaprine (FLEXERIL) 10 MG tablet Take 1 tablet (10 mg total) by mouth 2 (two) times daily as needed for muscle spasms. Patient not taking: Reported on 05/04/2019 01/18/19   Rinehuls, Kinnie ScalesDavid L, PA-C  haloperidol (HALDOL) 5 MG tablet Take 3 tablets (15 mg total) by mouth at bedtime. 06/01/19   Malvin JohnsFarah, Brian, MD  meloxicam (MOBIC) 7.5 MG tablet Take 1 tablet (7.5 mg total) by mouth daily. 08/09/19   Bast, Gloris Manchesterraci A, NP  naphazoline-pheniramine (NAPHCON-A) 0.025-0.3 % ophthalmic solution Place 2 drops into the left eye 4 (four) times daily as needed for eye irritation. 08/12/19   Margaret Cockerill A, PA-C  nicotine (NICODERM CQ - DOSED IN MG/24 HOURS) 14 mg/24hr patch Place 1 patch (14 mg total) onto the skin daily. Patient not taking: Reported on 05/04/2019 01/18/19   Rinehuls, Kinnie ScalesDavid L, PA-C  sennosides-docusate sodium (SENOKOT-S) 8.6-50 MG tablet Take 2 tablets by mouth daily as needed for constipation.    [provider]  temazepam (RESTORIL) 15 MG capsule Take 1 capsule (15 mg total) by mouth at bedtime. 06/01/19   Malvin JohnsFarah, Brian, MD    Family History No family history on file.  Social History Social History   Tobacco Use   Smoking status: Current Every Day Smoker    Packs/day: 2.00    Years: 50.00    Pack years: 100.00    Types: Cigarettes   Smokeless tobacco: Never Used   Tobacco comment: 10  cigarettes per day,  waits an hour between  Substance Use Topics   Alcohol use: No   Drug use: No     Allergies   Pollen extract   Review of Systems Review of Systems  Constitutional: Negative for chills and fever.  Eyes: Positive for redness and itching. Negative for photophobia, pain, discharge and visual disturbance.  Gastrointestinal: Negative for abdominal pain, nausea and vomiting.  Musculoskeletal: Positive for arthralgias.  Neurological: Negative for syncope, speech difficulty, weakness, light-headedness, numbness and headaches.  All other systems reviewed and are negative.    Physical Exam Updated Vital Signs BP (!) 175/80 (BP Location: Right Arm)    Pulse 75    Temp 98 F (36.7 C) (Oral)    Resp 18    Ht 5\' 7"  (1.702  m)    Wt 71.2 kg    SpO2 99%    BMI 24.58 kg/m   Physical Exam Vitals signs and nursing note reviewed.  Constitutional:      General: He is not in acute distress.    Appearance: He is well-developed.  HENT:     Head: Normocephalic and atraumatic.  Eyes:     General:        Right eye: No discharge.        Left eye: No discharge.     Extraocular Movements: Extraocular movements intact.     Pupils: Pupils are equal, round, and reactive to light.     Comments: Left eye with mild conjunctival injection.  No chemosis, proptosis, or consensual photophobia.  No periorbital erythema or swelling.  No tenderness to palpation around the orbits.  No restriction with EOMs or pain with EOMs.  No foreign bodies noted.  On fluorescein stain no foreign bodies, dendritic lesions, corneal abrasions or ulcerations, or rust rings. Seidel sign absent.  Neck:     Musculoskeletal: Normal range of motion and neck supple.     Vascular: No JVD.     Trachea: No tracheal deviation.  Cardiovascular:     Rate and Rhythm: Normal rate and regular rhythm.     Pulses: Normal pulses.     Comments: 2+ radial and DP/PT pulses bilaterally, no lower extremity edema Pulmonary:     Effort: Pulmonary  effort is normal.     Breath sounds: Normal breath sounds.  Abdominal:     General: Abdomen is flat. Bowel sounds are normal. There is no distension.     Palpations: Abdomen is soft.     Tenderness: There is no abdominal tenderness. There is no guarding or rebound.  Musculoskeletal:        General: Tenderness present.     Left shoulder: He exhibits decreased range of motion, tenderness and pain. He exhibits no swelling, no effusion, no crepitus, no deformity, no spasm and normal strength.     Comments: Tenderness to palpation of the left shoulder along the acromioclavicular joint and bicipital tendon groove.  No erythema, warmth, crepitus or swelling.  Decreased range of motion with abduction and flexion which patient reports is chronic.  5/5 strength of BUE and BLE major muscle groups with some limitation with abduction of the left shoulder against resistance. Positive arc of pain.   Skin:    General: Skin is warm and dry.     Findings: No erythema.  Neurological:     Mental Status: He is alert.     Comments: Mental Status:  Alert, thought content appropriate, able to give a coherent history. Speech fluent without evidence of aphasia. Able to follow 2 step commands without difficulty.  Cranial Nerves:  II:  Peripheral visual fields grossly normal, pupils equal, round, reactive to light III,IV, VI: ptosis not present, extra-ocular motions intact bilaterally  V,VII: smile symmetric, facial light touch sensation equal VIII: hearing grossly normal to voice  X: uvula elevates symmetrically  XI: bilateral shoulder shrug symmetric and strong XII: midline tongue extension without fassiculations Motor:  Normal tone.  No pronator drift. Sensory: light touch normal in all extremities. Cerebellar: Romberg sign absent Gait: normal gait and balance. Able to walk on toes and heels with ease.     Psychiatric:        Behavior: Behavior normal.      ED Treatments / Results  Labs (all labs  ordered are listed, but only abnormal  results are displayed) Labs Reviewed  DIFFERENTIAL - Abnormal; Notable for the following components:      Result Value   Abs Immature Granulocytes 0.21 (*)    All other components within normal limits  COMPREHENSIVE METABOLIC PANEL - Abnormal; Notable for the following components:   Glucose, Bld 173 (*)    BUN 26 (*)    All other components within normal limits  CBG MONITORING, ED - Abnormal; Notable for the following components:   Glucose-Capillary 176 (*)    All other components within normal limits  APTT  CBC    EKG None  Radiology No results found.  Procedures Procedures (including critical care time)  Medications Ordered in ED Medications  fluorescein ophthalmic strip 1 strip (1 strip Left Eye Given 08/12/19 2204)  tetracaine (PONTOCAINE) 0.5 % ophthalmic solution 2 drop (2 drops Left Eye Given 08/12/19 2203)     Initial Impression / Assessment and Plan / ED Course  I have reviewed the triage vital signs and the nursing notes.  Pertinent labs & imaging results that were available during my care of the patient were reviewed by me and considered in my medical decision making (see chart for details).        Patient presenting to the ED for evaluation of multiple complaints.  He is afebrile, hypertensive in the ED but questionable medication compliance.  Nontoxic in appearance.  He is neurovascularly intact.  His complaint of shoulder pain is chronic, no history of trauma.  Physical examination is fairly reassuring, doubt septic arthritis, gout, osteomyelitis.  I do not think that he requires emergent radiographs in the setting of what appears to be chronic shoulder pain.  Physical examination suggestive of possible adhesive capsulitis versus rotator cuff pathology.  Discussed conservative therapy and management with Tylenol, shoulder sling for comfort and stretching exercises at home.  He has received steroid injections in the past  that have been helpful and I encouraged him to follow-up with his PCP or his orthopedist for this.  With regards to his left I conjunctival injection and itching his examination is suggestive of viral conjunctivitis.  No vision changes.  On fluorescein stain no evidence of corneal abrasion, ulceration, HSV ophthalmicus, globe rupture, hyphema, or foreign body.  No evidence of preseptal or orbital cellulitis or optic neuritis.  Will discharge with naphazoline drops and recommend follow-up with ophthalmologist for reevaluation of symptoms.   He did complain to his fiance that he was having difficulty ambulating this morning but states this is chronic and associated with his nighttime medications. On my assessment he has no focal neurologic deficits, ambulatory without difficulty exhibits steady gait and balance and is able to heel walk and toe walk without difficulty.  I doubt recurrent CVA or other acute intracranial abnormality.  I did discuss follow-up with his PCP and psychiatrist for medication management to discuss side effects.  Discussed strict ED return precautions.  Patient and fianc verbalized understanding of and agreement with plan and patient stable for discharge home at this time.     Final Clinical Impressions(s) / ED Diagnoses   Final diagnoses:  Viral conjunctivitis of left eye  Acute pain of left shoulder    ED Discharge Orders         Ordered    naphazoline-pheniramine (NAPHCON-A) 0.025-0.3 % ophthalmic solution  4 times daily PRN     08/12/19 2243           Jeanie Sewer, PA-C 08/12/19 2254    Tilden Fossa,  MD 08/13/19 1237

## 2019-08-13 NOTE — ED Notes (Signed)
Pt verbalized understanding of d/c instructions, followup care and prescriptions and s/s requiring return to ED. Pt had no further questions.

## 2019-09-12 ENCOUNTER — Emergency Department (HOSPITAL_COMMUNITY)
Admission: EM | Admit: 2019-09-12 | Discharge: 2019-09-12 | Disposition: A | Discharge status: Other Acute Inpatient Hospital | Payer: Medicare Other | Attending: Emergency Medicine | Admitting: Emergency Medicine

## 2019-09-12 ENCOUNTER — Other Ambulatory Visit: Payer: Self-pay

## 2019-09-12 DIAGNOSIS — R45851 Suicidal ideations: Secondary | ICD-10-CM

## 2019-09-12 DIAGNOSIS — T45522A Poisoning by antithrombotic drugs, intentional self-harm, initial encounter: Secondary | ICD-10-CM | POA: Insufficient documentation

## 2019-09-12 DIAGNOSIS — F17210 Nicotine dependence, cigarettes, uncomplicated: Secondary | ICD-10-CM | POA: Diagnosis not present

## 2019-09-12 DIAGNOSIS — I10 Essential (primary) hypertension: Secondary | ICD-10-CM | POA: Diagnosis not present

## 2019-09-12 DIAGNOSIS — T50902A Poisoning by unspecified drugs, medicaments and biological substances, intentional self-harm, initial encounter: Secondary | ICD-10-CM

## 2019-09-12 DIAGNOSIS — T466X2A Poisoning by antihyperlipidemic and antiarteriosclerotic drugs, intentional self-harm, initial encounter: Secondary | ICD-10-CM | POA: Insufficient documentation

## 2019-09-12 DIAGNOSIS — T428X2A Poisoning by antiparkinsonism drugs and other central muscle-tone depressants, intentional self-harm, initial encounter: Secondary | ICD-10-CM | POA: Diagnosis not present

## 2019-09-12 DIAGNOSIS — T426X2A Poisoning by other antiepileptic and sedative-hypnotic drugs, intentional self-harm, initial encounter: Secondary | ICD-10-CM | POA: Diagnosis not present

## 2019-09-12 DIAGNOSIS — F319 Bipolar disorder, unspecified: Secondary | ICD-10-CM | POA: Diagnosis not present

## 2019-09-12 DIAGNOSIS — Z79899 Other long term (current) drug therapy: Secondary | ICD-10-CM | POA: Insufficient documentation

## 2019-09-12 DIAGNOSIS — Z20828 Contact with and (suspected) exposure to other viral communicable diseases: Secondary | ICD-10-CM | POA: Insufficient documentation

## 2019-09-12 LAB — COMPREHENSIVE METABOLIC PANEL
ALT: 12 U/L (ref 0–44)
AST: 16 U/L (ref 15–41)
Albumin: 3.4 g/dL — ABNORMAL LOW (ref 3.5–5.0)
Alkaline Phosphatase: 60 U/L (ref 38–126)
Anion gap: 11 (ref 5–15)
BUN: 23 mg/dL (ref 8–23)
CO2: 24 mmol/L (ref 22–32)
Calcium: 8.4 mg/dL — ABNORMAL LOW (ref 8.9–10.3)
Chloride: 103 mmol/L (ref 98–111)
Creatinine, Ser: 0.91 mg/dL (ref 0.61–1.24)
GFR calc Af Amer: >60 mL/min (ref >60)
GFR calc non Af Amer: >60 mL/min (ref >60)
Glucose, Bld: 115 mg/dL — ABNORMAL HIGH (ref 70–99)
Potassium: 3.8 mmol/L (ref 3.5–5.1)
Sodium: 138 mmol/L (ref 135–145)
Total Bilirubin: 0.3 mg/dL (ref 0.3–1.2)
Total Protein: 6.5 g/dL (ref 6.5–8.1)

## 2019-09-12 LAB — ETHANOL: Alcohol, Ethyl (B): <10 mg/dL (ref <10)

## 2019-09-12 LAB — CBC
HCT: 40.8 % (ref 39.0–52.0)
Hemoglobin: 13.8 g/dL (ref 13.0–17.0)
MCH: 30.8 pg (ref 26.0–34.0)
MCHC: 33.8 g/dL (ref 30.0–36.0)
MCV: 91.1 fL (ref 80.0–100.0)
Platelets: 168 10*3/uL (ref 150–400)
RBC: 4.48 MIL/uL (ref 4.22–5.81)
RDW: 13.1 % (ref 11.5–15.5)
WBC: 7.5 10*3/uL (ref 4.0–10.5)
nRBC: 0 % (ref 0.0–0.2)

## 2019-09-12 LAB — RAPID URINE DRUG SCREEN, HOSP PERFORMED
Amphetamines: NOT DETECTED
Barbiturates: NOT DETECTED
Benzodiazepines: NOT DETECTED
Cocaine: NOT DETECTED
Opiates: NOT DETECTED
Tetrahydrocannabinol: NOT DETECTED

## 2019-09-12 LAB — VALPROIC ACID LEVEL
Valproic Acid Lvl: 43 ug/mL — ABNORMAL LOW (ref 50.0–100.0)
Valproic Acid Lvl: 32 ug/mL — ABNORMAL LOW (ref 50.0–100.0)

## 2019-09-12 LAB — ACETAMINOPHEN LEVEL: Acetaminophen (Tylenol), Serum: <10 ug/mL — ABNORMAL LOW (ref 10–30)

## 2019-09-12 LAB — SALICYLATE LEVEL: Salicylate Lvl: <7 mg/dL (ref 2.8–30.0)

## 2019-09-12 LAB — SARS CORONAVIRUS 2 BY RT PCR (HOSPITAL ORDER, PERFORMED IN ~~LOC~~ HOSPITAL LAB): SARS Coronavirus 2: NEGATIVE

## 2019-09-12 NOTE — ED Notes (Signed)
[place] (fiance)

## 2019-09-12 NOTE — ED Notes (Addendum)
Accepted At [facility]  To be transported after [place] [facility]  Accepting Dr. Elaina Hoops [phone]

## 2019-09-12 NOTE — Progress Notes (Signed)
Per Christinia Gully at [facility] pt. Has been accepted to [place] [facility]. Accepting provider is Elaina Hoops.  Patient can arrive after 4pm. Number for report is [phone]. Awaiting COVID results.  Darletta Moll MSW, [employer] Worker Disposition  [facility] Ph: [phone] Fax: [phone]

## 2019-09-12 NOTE — ED Notes (Signed)
Called [facility] as requested

## 2019-09-12 NOTE — ED Notes (Signed)
Poison controlled called back to check on Valproic Level. Recommend to redraw a level to make sure staying the same or down.

## 2019-09-12 NOTE — Progress Notes (Signed)
Patient meets criteria for inpatient treatment per Lindon Romp, NP. CSW faxed referrals to the following facilities for review:  [facility]   [facility]   [facility]     [facility] will continue to seek bed placement.   Darletta Moll MSW, [employer] Worker Disposition  [facility] Ph: [phone] Fax: [phone]

## 2019-09-12 NOTE — ED Triage Notes (Addendum)
Patient states that he took too much of his medicine and took (14) pills so that his girlfriend would stay the night. Pt cannot tell me what he took - I spoke with girlfriend and she cannot tell me what he took either. Per pt - he takes "bubble packs" of medicines (which has multiple meds in it) so he took an additional bubble pack. States he does not want to kill himself - denies SI or HI.

## 2019-09-12 NOTE — ED Provider Notes (Signed)
[facility] EMERGENCY DEPARTMENT Provider Note   CSN: [id number] Arrival date & time: 09/12/19  0024     History   Chief Complaint Chief Complaint  Patient presents with  . Drug Overdose    HPI Bryan Davies is a 68 y.o. male.     Patient comes to the ER accompanied by his significant other with concern for overdose.  Significant other became aware of the patient's overdose when he told her about it.  He reports that he was feeling lonely and wanted her to stay with him and she was going to go home and go to bed.  He took an additional dose of all of his medications in an attempt to get her to stay with him.  The patient's significant other, however, reports that he told her that he was going to kill himself after she became aware of the overdose.  Patient took one additional dose of his Cogentin, atorvastatin, gabapentin, Depakote, Brilinta.  He reports that the overdose was 6 hours ago.     Past Medical History:  Diagnosis Date  . Anxiety   . Hypertension   . Shingles 2013  . Shoulder pain     Patient Active Problem List   Diagnosis Date Noted  . Bipolar 1 disorder ([place]) 05/27/2019  . 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 Ischemic stroke ([place]) 01/15/2019  . Left shoulder pain 12/05/2015  . Urinary hesitancy 12/05/2015  . Pain in lower jaw 07/11/2015  . Hyperlipidemia 02/11/2014  . 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 Dyshydrosis 07/26/2013  . 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 Hypertension 05/21/2012  . Post herpetic neuralgia 05/21/2012  . Bipolar disorder ([place]) 05/21/2012  . Tobacco abuse 05/21/2012    Past Surgical History:  Procedure Laterality Date  . IR ANGIO VERTEBRAL SEL SUBCLAVIAN INNOMINATE UNI R MOD SED  01/16/2019  . IR CT HEAD LTD  01/16/2019  . IR INTRAVSC STENT CERV CAROTID W/O EMB-PROT MOD SED INC ANGIO  01/16/2019  . IR PERCUTANEOUS ART THROMBECTOMY/INFUSION INTRACRANIAL INC DIAG ANGIO  01/16/2019  .  RADIOLOGY WITH ANESTHESIA N/A 01/15/2019   Procedure: IR WITH ANESTHESIA;  Surgeon: Luanne Bras, MD;  Location: [facility];  Service: Radiology;  Laterality: N/A;        Home Medications    Prior to Admission medications   Medication Sig Start Date End Date Taking? Authorizing Provider  amLODipine (NORVASC) 5 MG tablet Take 1 tablet (5 mg total) by mouth daily. 07/25/16   Glenis Smoker, MD  aspirin 81 MG chewable tablet Chew 1 tablet (81 mg total) by mouth daily. 01/18/19   Rinehuls, Early Chars, PA-C  atorvastatin (LIPITOR) 40 MG tablet Take 1 tablet (40 mg total) by mouth daily. 01/18/19   Rinehuls, Early Chars, PA-C  benztropine (COGENTIN) 0.5 MG tablet Take 1 tablet (0.5 mg total) by mouth 2 (two) times daily. 06/01/19   Johnn Hai, MD  cyclobenzaprine (FLEXERIL) 10 MG tablet Take 1 tablet (10 mg total) by mouth 2 (two) times daily as needed for muscle spasms. Patient not taking: Reported on 05/04/2019 01/18/19   Rinehuls, Early Chars, PA-C  divalproex (DEPAKOTE ER) 250 MG 24 hr tablet Take 3 tablets (750 mg total) by mouth at bedtime. 06/01/19   Johnn Hai, MD  gabapentin (NEURONTIN) 300 MG capsule Take 1 capsule (300 mg total) by mouth 2 (two) times daily. 01/18/19   Rinehuls, David L, PA-C  haloperidol (HALDOL) 5 MG tablet Take 3 tablets (15 mg  total) by mouth at bedtime. 06/01/19   Malvin JohnsFarah, Brian, MD  losartan (COZAAR) 50 MG tablet Take 1 tablet (50 mg total) by mouth daily. 06/01/19   Malvin JohnsFarah, Brian, MD  meloxicam (MOBIC) 7.5 MG tablet Take 1 tablet (7.5 mg total) by mouth daily. 08/09/19   Bast, Gloris Manchesterraci A, NP  naphazoline-pheniramine (NAPHCON-A) 0.025-0.3 % ophthalmic solution Place 2 drops into the left eye 4 (four) times daily as needed for eye irritation. 08/12/19   Fawze, Mina A, PA-C  nicotine (NICODERM CQ - DOSED IN MG/24 HOURS) 14 mg/24hr patch Place 1 patch (14 mg total) onto the skin daily. Patient not taking: Reported on 05/04/2019 01/18/19   Rinehuls, Kinnie ScalesDavid L, PA-C  sennosides-docusate sodium  (SENOKOT-S) 8.6-50 MG tablet Take 2 tablets by mouth daily as needed for constipation.    [provider]  tamsulosin (FLOMAX) 0.4 MG CAPS capsule Take 0.4 mg by mouth daily. 03/27/19   [provider]  temazepam (RESTORIL) 15 MG capsule Take 1 capsule (15 mg total) by mouth at bedtime. 06/01/19   Malvin JohnsFarah, Brian, MD  ticagrelor (BRILINTA) 90 MG TABS tablet Take 1 tablet (90 mg total) by mouth 2 (two) times daily. 01/18/19   Rinehuls, Kinnie Scalesavid L, PA-C    Family History No family history on file.  Social History Social History   Tobacco Use  . Smoking status: Current Every Day Smoker    Packs/day: 2.00    Years: 50.00    Pack years: 100.00    Types: Cigarettes  . Smokeless tobacco: Never Used  . Tobacco comment: 10  cigarettes per day, waits an hour between  Substance Use Topics  . Alcohol use: No  . Drug use: No     Allergies   Pollen extract   Review of Systems Review of Systems  Psychiatric/Behavioral: Positive for suicidal ideas.  All other systems reviewed and are negative.    Physical Exam Updated Vital Signs BP (!) 155/96 (BP Location: Right Arm)   Pulse 71   Temp 97.6 F (36.4 C) (Oral)   Resp 17   SpO2 97%   Physical Exam Vitals signs and nursing note reviewed.  Constitutional:      General: He is not in acute distress.    Appearance: Normal appearance. He is well-developed.  HENT:     Head: Normocephalic and atraumatic.     Right Ear: Hearing normal.     Left Ear: Hearing normal.     Nose: Nose normal.  Eyes:     Conjunctiva/sclera: Conjunctivae normal.     Pupils: Pupils are equal, round, and reactive to light.  Neck:     Musculoskeletal: Normal range of motion and neck supple.  Cardiovascular:     Rate and Rhythm: Regular rhythm.     Heart sounds: S1 normal and S2 normal. No murmur. No friction rub. No gallop.   Pulmonary:     Effort: Pulmonary effort is normal. No respiratory distress.     Breath sounds: Normal breath sounds.   Chest:     Chest wall: No tenderness.  Abdominal:     General: Bowel sounds are normal.     Palpations: Abdomen is soft.     Tenderness: There is no abdominal tenderness. There is no guarding or rebound. Negative signs include Murphy's sign and McBurney's sign.     Hernia: No hernia is present.  Musculoskeletal: Normal range of motion.  Skin:    General: Skin is warm and dry.     Findings: No rash.  Neurological:     Mental Status: He is alert and oriented to person, place, and time.     GCS: GCS eye subscore is 4. GCS verbal subscore is 5. GCS motor subscore is 6.     Cranial Nerves: No cranial nerve deficit.     Sensory: No sensory deficit.     Coordination: Coordination normal.  Psychiatric:        Speech: Speech normal.        Behavior: Behavior normal.        Thought Content: Thought content normal.      ED Treatments / Results  Labs (all labs ordered are listed, but only abnormal results are displayed) Labs Reviewed  COMPREHENSIVE METABOLIC PANEL - Abnormal; Notable for the following components:      Result Value   Glucose, Bld 115 (*)    Calcium 8.4 (*)    Albumin 3.4 (*)    All other components within normal limits  ACETAMINOPHEN LEVEL - Abnormal; Notable for the following components:   Acetaminophen (Tylenol), Serum <10 (*)    All other components within normal limits  VALPROIC ACID LEVEL - Abnormal; Notable for the following components:   Valproic Acid Lvl 43 (*)    All other components within normal limits  ETHANOL  SALICYLATE LEVEL  CBC  RAPID URINE DRUG SCREEN, HOSP PERFORMED    EKG EKG Interpretation  Date/Time:  Saturday September 12 2019 00:45:04 EST Ventricular Rate:  88 PR Interval:  138 QRS Duration: 84 QT Interval:  364 QTC Calculation: 440 R Axis:   16 Text Interpretation: Normal sinus rhythm Normal ECG Confirmed by Gilda Crease [phone]) on 09/12/2019 1:46:42 AM   Radiology No results found.  Procedures Procedures  (including critical care time)  Medications Ordered in ED Medications - No data to display   Initial Impression / Assessment and Plan / ED Course  I have reviewed the triage vital signs and the nursing notes.  Pertinent labs & imaging results that were available during my care of the patient were reviewed by me and considered in my medical decision making (see chart for details).       Patient presents to the ED emergency department after a suicidal gesture.  Patient took an additional dose of all of his prescribed medications tonight when he found out that his girlfriend was going to be going home and not stay with him tonight.  He did, however, tell her that he wanted to kill himself after that.  At time of arrival to the ER, however, he states that he no longer wants to harm himself.  He arrived approximately 6 hours after the ingestion.  Work-up has been unremarkable including a low Depakote level.  He has been medically cleared and psychiatry has recommended [facility] psych inpatient treatment.  Final Clinical Impressions(s) / ED Diagnoses   Final diagnoses:  Intentional drug overdose, initial encounter [facility])  Suicidal ideation    ED Discharge Orders    None       Pollina, Canary Brim, MD 09/12/19 [phone]

## 2019-09-12 NOTE — ED Provider Notes (Signed)
Was asked to see patient for EMTALA. Patient is to be transported to [facility]. I spoke with patient who denies any questions or complaints.  He is able to walk without difficulty.  He was medically cleared overnight. We discussed purpose of transfer along with risks and he states his understanding.  He is in no physical distress at this time. EMTALA completed.   Lorin Glass, PA-C 09/12/19 1702    Malvin Johns, MD 09/12/19 1801

## 2019-09-12 NOTE — BHH Counselor (Signed)
Per pt's fiancee [name] Piper), she stayed with the pt but left today. Pt's fiancee' reported, she ran some errands came home, talked to her brother about Thanksgiving who lives in another state. Per fiancee', the pt called her five times while she was on the phone with her brother and even called her daughter. Per fiancee' when she got off the phone she returned the pt's call. Pt's fiancee reported, the pt told her he took extra pills and is suicidal. Pt's fiancee' reported, her daughter recorded the pt saying he was suicidal. Pt's fiancee reported, she went to the pt house then took him to the hospital. Pt's fiancee reported, she does feel the pt could be pulling one on her, saying he's suicidal and taking those pills just for her to come over his house, when maybe he didn't take any pills nor feel suicidal.   Clinician asked the fiance if the pt is discharged from [facility] she feel he'll he safe st home, she replied, "I hope so." Pt's fiancee reported, she will stay with the pt if discharged burt she has her own home.   Vertell Novak, [place], [facility], [facility] Triage Specialist [phone]

## 2019-09-12 NOTE — ED Notes (Signed)
Rose from poison control said he did NOT need a repeat Valproic Level.

## 2019-09-12 NOTE — ED Notes (Signed)
Meds for each bubble pack taken (took 2 bubble packs):  Cogentin @ .5mg   Atorvastatin Gabapentin DivalProex Brilinta  So, pt took double dose, essentially.

## 2019-09-12 NOTE — ED Notes (Signed)
Lunch ordered 
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
## 2019-10-08 ENCOUNTER — Encounter (HOSPITAL_COMMUNITY): Payer: Self-pay | Admitting: Emergency Medicine

## 2019-10-08 ENCOUNTER — Emergency Department (HOSPITAL_COMMUNITY): Payer: Medicare Other

## 2019-10-08 ENCOUNTER — Emergency Department (HOSPITAL_COMMUNITY)
Admission: EM | Admit: 2019-10-08 | Discharge: 2019-10-08 | Disposition: A | Discharge status: Home / Self Care | Payer: Medicare Other | Attending: Emergency Medicine | Admitting: Emergency Medicine

## 2019-10-08 ENCOUNTER — Other Ambulatory Visit: Payer: Self-pay

## 2019-10-08 DIAGNOSIS — I10 Essential (primary) hypertension: Secondary | ICD-10-CM | POA: Insufficient documentation

## 2019-10-08 DIAGNOSIS — Z79899 Other long term (current) drug therapy: Secondary | ICD-10-CM | POA: Insufficient documentation

## 2019-10-08 DIAGNOSIS — F17210 Nicotine dependence, cigarettes, uncomplicated: Secondary | ICD-10-CM | POA: Insufficient documentation

## 2019-10-08 DIAGNOSIS — Z7982 Long term (current) use of aspirin: Secondary | ICD-10-CM | POA: Diagnosis not present

## 2019-10-08 DIAGNOSIS — R4182 Altered mental status, unspecified: Secondary | ICD-10-CM | POA: Diagnosis present

## 2019-10-08 DIAGNOSIS — R410 Disorientation, unspecified: Secondary | ICD-10-CM | POA: Diagnosis not present

## 2019-10-08 LAB — COMPREHENSIVE METABOLIC PANEL
ALT: 12 U/L (ref 0–44)
AST: 17 U/L (ref 15–41)
Albumin: 3.3 g/dL — ABNORMAL LOW (ref 3.5–5.0)
Alkaline Phosphatase: 55 U/L (ref 38–126)
Anion gap: 10 (ref 5–15)
BUN: 15 mg/dL (ref 8–23)
CO2: 22 mmol/L (ref 22–32)
Calcium: 8.8 mg/dL — ABNORMAL LOW (ref 8.9–10.3)
Chloride: 103 mmol/L (ref 98–111)
Creatinine, Ser: 0.83 mg/dL (ref 0.61–1.24)
GFR calc Af Amer: >60 mL/min (ref >60)
GFR calc non Af Amer: >60 mL/min (ref >60)
Glucose, Bld: 190 mg/dL — ABNORMAL HIGH (ref 70–99)
Potassium: 4.4 mmol/L (ref 3.5–5.1)
Sodium: 135 mmol/L (ref 135–145)
Total Bilirubin: 0.8 mg/dL (ref 0.3–1.2)
Total Protein: 6.5 g/dL (ref 6.5–8.1)

## 2019-10-08 LAB — CBC
HCT: 41.4 % (ref 39.0–52.0)
Hemoglobin: 14 g/dL (ref 13.0–17.0)
MCH: 30.7 pg (ref 26.0–34.0)
MCHC: 33.8 g/dL (ref 30.0–36.0)
MCV: 90.8 fL (ref 80.0–100.0)
Platelets: 194 10*3/uL (ref 150–400)
RBC: 4.56 MIL/uL (ref 4.22–5.81)
RDW: 12.6 % (ref 11.5–15.5)
WBC: 6.1 10*3/uL (ref 4.0–10.5)
nRBC: 0 % (ref 0.0–0.2)

## 2019-10-08 LAB — LIPASE, BLOOD: Lipase: 18 U/L (ref 11–51)

## 2019-10-08 LAB — URINALYSIS, ROUTINE W REFLEX MICROSCOPIC
Bilirubin Urine: NEGATIVE
Glucose, UA: NEGATIVE mg/dL
Hgb urine dipstick: NEGATIVE
Ketones, ur: NEGATIVE mg/dL
Leukocytes,Ua: NEGATIVE
Nitrite: NEGATIVE
Protein, ur: NEGATIVE mg/dL
Specific Gravity, Urine: 1.006 (ref 1.005–1.030)
pH: 7 (ref 5.0–8.0)

## 2019-10-08 LAB — RAPID URINE DRUG SCREEN, HOSP PERFORMED
Amphetamines: NOT DETECTED
Barbiturates: NOT DETECTED
Benzodiazepines: POSITIVE — AB
Cocaine: NOT DETECTED
Opiates: NOT DETECTED
Tetrahydrocannabinol: NOT DETECTED

## 2019-10-08 LAB — AMMONIA: Ammonia: 38 umol/L — ABNORMAL HIGH (ref 9–35)

## 2019-10-08 LAB — ETHANOL: Alcohol, Ethyl (B): <10 mg/dL (ref <10)

## 2019-10-08 NOTE — ED Triage Notes (Signed)
Pt here for AMS, weakness and frequent falls according to family. States he used the bathroom in the kitchen because he didn't know where he was. Hx of CVA. Has not taken daily meds today.
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
## 2019-10-08 NOTE — Discharge Instructions (Addendum)
Follow-up with neurology.  Follow-up with your primary care doctor to go over your medications.

## 2019-10-08 NOTE — ED Notes (Signed)
Pt aware urine sample needed 

## 2020-03-14 ENCOUNTER — Other Ambulatory Visit: Payer: Self-pay | Admitting: Student

## 2020-03-14 DIAGNOSIS — I639 Cerebral infarction, unspecified: Secondary | ICD-10-CM
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
## 2020-05-18 ENCOUNTER — Emergency Department (HOSPITAL_COMMUNITY)
Admission: EM | Admit: 2020-05-18 | Discharge: 2020-05-19 | Disposition: A | Discharge status: Home / Self Care | Payer: Medicare Other | Attending: Emergency Medicine | Admitting: Emergency Medicine

## 2020-05-18 ENCOUNTER — Other Ambulatory Visit: Payer: Self-pay

## 2020-05-18 ENCOUNTER — Encounter (HOSPITAL_COMMUNITY): Payer: Self-pay | Admitting: *Deleted

## 2020-05-18 ENCOUNTER — Emergency Department (HOSPITAL_COMMUNITY): Payer: Medicare Other

## 2020-05-18 DIAGNOSIS — R079 Chest pain, unspecified: Secondary | ICD-10-CM | POA: Diagnosis present

## 2020-05-18 DIAGNOSIS — Z5321 Procedure and treatment not carried out due to patient leaving prior to being seen by health care provider: Secondary | ICD-10-CM | POA: Diagnosis not present

## 2020-05-18 LAB — CBC
HCT: 42 % (ref 39.0–52.0)
Hemoglobin: 13.9 g/dL (ref 13.0–17.0)
MCH: 29.9 pg (ref 26.0–34.0)
MCHC: 33.1 g/dL (ref 30.0–36.0)
MCV: 90.3 fL (ref 80.0–100.0)
Platelets: 186 10*3/uL (ref 150–400)
RBC: 4.65 MIL/uL (ref 4.22–5.81)
RDW: 12.7 % (ref 11.5–15.5)
WBC: 5.3 10*3/uL (ref 4.0–10.5)
nRBC: 0 % (ref 0.0–0.2)

## 2020-05-18 LAB — BASIC METABOLIC PANEL
Anion gap: 10 (ref 5–15)
BUN: 21 mg/dL (ref 8–23)
CO2: 23 mmol/L (ref 22–32)
Calcium: 9.4 mg/dL (ref 8.9–10.3)
Chloride: 106 mmol/L (ref 98–111)
Creatinine, Ser: 1.07 mg/dL (ref 0.61–1.24)
GFR calc Af Amer: >60 mL/min (ref >60)
GFR calc non Af Amer: >60 mL/min (ref >60)
Glucose, Bld: 91 mg/dL (ref 70–99)
Potassium: 4.6 mmol/L (ref 3.5–5.1)
Sodium: 139 mmol/L (ref 135–145)

## 2020-05-18 LAB — TROPONIN I (HIGH SENSITIVITY)
Troponin I (High Sensitivity): 4 ng/L (ref <18)
Troponin I (High Sensitivity): 4 ng/L (ref <18)

## 2020-05-18 MED ORDER — SODIUM CHLORIDE 0.9% FLUSH: 3.0000 mL | Freq: Once | INTRAVENOUS | Status: DC

## 2020-05-18 NOTE — ED Notes (Signed)
Pt called for labs, no answer x1 

## 2020-05-18 NOTE — ED Triage Notes (Signed)
The pt is c/o chest pain for 2 days no sob nsusea or sizziness

## 2020-05-19 NOTE — ED Notes (Signed)
Pt checked out.

## 2020-06-08 ENCOUNTER — Other Ambulatory Visit: Payer: Self-pay

## 2020-06-08 ENCOUNTER — Ambulatory Visit (INDEPENDENT_AMBULATORY_CARE_PROVIDER_SITE_OTHER): Payer: Medicare Other | Admitting: Psychiatry

## 2020-06-08 ENCOUNTER — Ambulatory Visit (HOSPITAL_COMMUNITY): Payer: Medicare Other | Admitting: *Deleted

## 2020-06-08 ENCOUNTER — Encounter (HOSPITAL_COMMUNITY): Payer: Self-pay

## 2020-06-08 VITALS — BP 119/74 | HR 111 | Ht 67.75 in | Wt 142.0 lb

## 2020-06-08 DIAGNOSIS — G2401 Drug induced subacute dyskinesia: Secondary | ICD-10-CM

## 2020-06-08 DIAGNOSIS — F3162 Bipolar disorder, current episode mixed, moderate: Secondary | ICD-10-CM

## 2020-06-08 MED ORDER — HALOPERIDOL DECANOATE 100 MG/ML IM SOLN
100.0000 mg | Freq: Once | INTRAMUSCULAR | Status: AC
  Administered 2020-06-08: 100 mg via INTRAMUSCULAR

## 2020-06-08 MED ORDER — GABAPENTIN 300 MG PO CAPS: 300.0000 mg | ORAL_CAPSULE | Freq: Two times a day (BID) | ORAL | 11 refills | Status: AC

## 2020-06-08 MED ORDER — NICOTINE 14 MG/24HR TD PT24: 14.0000 mg | MEDICATED_PATCH | Freq: Every day | TRANSDERMAL | 0 refills | Status: DC

## 2020-06-08 MED ORDER — DIVALPROEX SODIUM ER 250 MG PO TB24: 750.0000 mg | ORAL_TABLET | Freq: Every day | ORAL | 2 refills | Status: DC

## 2020-06-08 MED ORDER — HALOPERIDOL DECANOATE 100 MG/ML IM SOLN: 100.0000 mg | INTRAMUSCULAR | 11 refills | Status: DC

## 2020-06-08 MED ORDER — AUSTEDO 12 MG PO TABS: 18.0000 mg | ORAL_TABLET | Freq: Two times a day (BID) | ORAL | 2 refills | Status: DC

## 2020-06-08 NOTE — Progress Notes (Signed)
Psychiatric Initial Adult Assessment   Patient Identification: Bryan Davies MRN:  [id number] Date of Evaluation:  06/08/2020 [facility] Chief Complaint:  "I'm okay" Visit Diagnosis:    ICD-10-CM   1. Bipolar 1 disorder, mixed, moderate (HCC)  F31.62 gabapentin (NEURONTIN) 300 MG capsule    haloperidol decanoate (HALDOL DECANOATE) 100 MG/ML injection    nicotine (NICODERM CQ - DOSED IN MG/24 HOURS) 14 mg/24hr patch    divalproex (DEPAKOTE ER) 250 MG 24 hr tablet    DISCONTINUED: divalproex (DEPAKOTE ER) 250 MG 24 hr tablet  2. Tardive dyskinesia  G24.01 Deutetrabenazine (AUSTEDO) 12 MG TABS    History of Present Illness: 69 year old male seen today for initial psychiatric evaluation.  [facility]  He has a psychiatric history of bipolar disorder and anxiety.  He is currently managed on gabapentin 300 mg 3 times daily, Restoril 15 mg at bedtime, and Haldol 100 mg injection q. 28 days.  Today he reports that he is doing well.  On exam patient is pleasant, cooperative, slightly confused, and disheveled.  Patient was seen with wife who notes that he had a stroke in 2020 and a heart attack in the early 1990s.  Patient informed provider that he has 1 child and addressed his wife is his girlfriend.  Patient's wife corrected him and informed writer that they have 4 children.  Patient reports that he is doing well on current medication regimen.  His wife agrees that he is doing well however notes that he sleeps approximately 13 hours a day.  During exam patient was noted to have slight facial twitching and abnormal leg tapping.  Provider conducted an aims assessment: a 4.  Provider informed patient to discontinue Cogentin 2 mg and start Austedo (patient was given a four week supply). Potential side effects of medication and risks vs benefits of treatment vs non-treatment were explained and discussed. All questions were  answered.  Patient is agreeable to continue all other medications as prescribed.  No other concerns noted at this time.  Associated Signs/Symptoms: Depression Symptoms:  hypersomnia, (Hypo) Manic Symptoms:  Denies Anxiety Symptoms:  Denies Psychotic Symptoms:  Denies PTSD Symptoms: Denies  Past Psychiatric History: Bipolar Disorder and anxiety Previous Psychotropic Medications: Yes   Substance Abuse History in the last 12 months:  Yes.    Consequences of Substance Abuse: NA  Past Medical History:  Past Medical History:  Diagnosis Date  . Anxiety   . Hypertension   . Shingles 2013  . Shoulder pain     Past Surgical History:  Procedure Laterality Date  . IR ANGIO VERTEBRAL SEL SUBCLAVIAN INNOMINATE UNI R MOD SED  01/16/2019  . IR CT HEAD LTD  01/16/2019  . IR INTRAVSC STENT CERV CAROTID W/O EMB-PROT MOD SED INC ANGIO  01/16/2019  . IR PERCUTANEOUS ART THROMBECTOMY/INFUSION INTRACRANIAL INC DIAG ANGIO  01/16/2019  . RADIOLOGY WITH ANESTHESIA N/A 01/15/2019   Procedure: IR WITH ANESTHESIA;  Surgeon: Julieanne Cottoneveshwar, Sanjeev, MD;  Location: [facility] OR;  Service: Radiology;  Laterality: N/A;    Family Psychiatric History: Denies  Family History: No family history on file.  Social History:   Social History   Socioeconomic History  . Marital status: Single    Spouse name: Not on file  . Number of children: Not on file  . Years of education: Not on file  . Highest education level: Not on file  Occupational History  . Occupation: Carpenter  Tobacco Use  .  Smoking status: Current Every Day Smoker    Packs/day: 2.00    Years: 50.00    Pack years: 100.00    Types: Cigarettes  . Smokeless tobacco: Never Used  . Tobacco comment: 10  cigarettes per day, waits an hour between  Vaping Use  . Vaping Use: Never used  Substance and Sexual Activity  . Alcohol use: No  . Drug use: No  . Sexual activity: Not on file  Other Topics Concern  . Not on file  Social History Narrative  . Not on  file   Social Determinants of Health   Financial Resource Strain:   . Difficulty of Paying Living Expenses:   Food Insecurity:   . Worried About Programme researcher, broadcasting/film/video in the Last Year:   . Barista in the Last Year:   Transportation Needs:   . Freight forwarder (Medical):   Marland Kitchen Lack of Transportation (Non-Medical):   Physical Activity:   . Days of Exercise per Week:   . Minutes of Exercise per Session:   Stress:   . Feeling of Stress :   Social Connections:   . Frequency of Communication with Friends and Family:   . Frequency of Social Gatherings with Friends and Family:   . Attends Religious Services:   . Active Member of Clubs or Organizations:   . Attends Banker Meetings:   Marland Kitchen Marital Status:     Additional Social History: Patient resides in [place] with his wife. He has four children. He is currently unemployed. He smokes one pack a cigarette per day. He denies alcohol or illicit drug use.   Allergies:   Allergies  Allergen Reactions  . Pollen Extract Other (See Comments)    Sneezing     Metabolic Disorder Labs: Lab Results  Component Value Date   HGBA1C 6.6 (H) 01/16/2019   MPG 142.72 01/16/2019   No results found for: PROLACTIN Lab Results  Component Value Date   CHOL 152 01/16/2019   TRIG 107 01/16/2019   HDL 35 (L) 01/16/2019   CHOLHDL 4.3 01/16/2019   VLDL 21 01/16/2019   LDLCALC 96 01/16/2019   LDLCALC 96 12/05/2015   Lab Results  Component Value Date   TSH 1.614 06/01/2019    Therapeutic Level Labs: No results found for: LITHIUM No results found for: CBMZ Lab Results  Component Value Date   VALPROATE 32 (L) 09/12/2019    Current Medications: Current Outpatient Medications  Medication Sig Dispense Refill  . amLODipine (NORVASC) 5 MG tablet Take 1 tablet (5 mg total) by mouth daily. (Patient not taking: Reported on 09/12/2019) 90 tablet 0  . aspirin 81 MG chewable tablet Chew 1 tablet (81 mg total) by mouth daily.     Marland Kitchen atorvastatin (LIPITOR) 40 MG tablet Take 1 tablet (40 mg total) by mouth daily. 30 tablet 11  . benztropine (COGENTIN) 0.5 MG tablet Take 1 tablet (0.5 mg total) by mouth 2 (two) times daily. 60 tablet 2  . Deutetrabenazine (AUSTEDO) 12 MG TABS Take 18 mg by mouth 2 (two) times daily. 60 tablet 2  . divalproex (DEPAKOTE ER) 250 MG 24 hr tablet Take 3 tablets (750 mg total) by mouth at bedtime. 90 tablet 2  . gabapentin (NEURONTIN) 300 MG capsule Take 1 capsule (300 mg total) by mouth 2 (two) times daily. 60 capsule 11  . haloperidol decanoate (HALDOL DECANOATE) 100 MG/ML injection Inject 1 mL (100 mg total) into the muscle every 28 (twenty-eight) days. 1  mL 11  . losartan (COZAAR) 50 MG tablet Take 1 tablet (50 mg total) by mouth daily. 90 tablet 2  . meloxicam (MOBIC) 7.5 MG tablet Take 1 tablet (7.5 mg total) by mouth daily. (Patient not taking: Reported on 09/12/2019) 15 tablet 0  . naphazoline-pheniramine (NAPHCON-A) 0.025-0.3 % ophthalmic solution Place 2 drops into the left eye 4 (four) times daily as needed for eye irritation. (Patient not taking: Reported on 09/12/2019) 5 mL 0  . nicotine (NICODERM CQ - DOSED IN MG/24 HOURS) 14 mg/24hr patch Place 1 patch (14 mg total) onto the skin daily. 28 patch 0  . sennosides-docusate sodium (SENOKOT-S) 8.6-50 MG tablet Take 2 tablets by mouth daily as needed for constipation.    . tamsulosin (FLOMAX) 0.4 MG CAPS capsule Take 0.4 mg by mouth daily.    . temazepam (RESTORIL) 15 MG capsule Take 1 capsule (15 mg total) by mouth at bedtime. (Patient not taking: Reported on 09/12/2019) 30 capsule 1  . ticagrelor (BRILINTA) 90 MG TABS tablet Take 1 tablet (90 mg total) by mouth 2 (two) times daily. 60 tablet 11   Current Facility-Administered Medications  Medication Dose Route Frequency Provider Last Rate Last Admin  . haloperidol decanoate (HALDOL DECANOATE) 100 MG/ML injection 100 mg  100 mg Intramuscular Once Shanna Cisco, NP         Musculoskeletal: Strength & Muscle Tone: decreased Gait & Station: unsteady, broad based Patient leans: N/A  Psychiatric Specialty Exam: Review of Systems  There were no vitals taken for this visit.There is no height or weight on file to calculate BMI.  General Appearance: Disheveled  Eye Contact:  Good  Speech:  Clear and Coherent and Normal Rate  Volume:  Normal  Mood:  Euthymic  Affect:  Congruent  Thought Process:  Coherent, Goal Directed and Linear  Orientation:  Other:  Patient had confusion during the exam, patient refered to his wife as his girlfriend and notes that he has one child when he has four  Thought Content:  Logical  Suicidal Thoughts:  No  Homicidal Thoughts:  No  Memory:  Immediate;   Fair Recent;   Fair Remote;   Fair  Judgement:  Fair  Insight:  Fair  Psychomotor Activity:  EPS  Concentration:  Concentration: Fair and Attention Span: Fair  Recall:  [employer] of Knowledge:Good  Language: Good  Akathisia:  No  Handed:  Left  AIMS (if indicated):  Done score 4  Assets:  Communication Skills Desire for Improvement Housing Social Support  ADL's:  Intact  Cognition: WNL  Sleep:  Fair   Screenings: AIMS     [facility]  AIMS Total Score 0    PHQ2-9     Office Visit from 07/14/2015 in [facility] Office Visit from 07/11/2015 in [facility] Office Visit from 08/17/2013 in [facility] Office Visit from 07/24/2013 in [facility]  PHQ-2 Total Score 0 0 0 0      Assessment and Plan: Patient notes that he is doing well overall.  During exam patient was noted to have slight facial twitching and abnormal leg tapping.  A 4-week supply of Austedo was given to patient of to help with symptoms of tardive dyskinesia.  He is agreeable to stop cogentin 2 mg daily.  He will continue all other medications  as prescribed.  1. Bipolar 1 disorder, mixed, moderate (  HCC)  Continue- gabapentin (NEURONTIN) 300 MG capsule; Take 1 capsule (300 mg total) by mouth 2 (two) times daily.  Dispense: 60 capsule; Refill: 11 Continue- haloperidol decanoate (HALDOL DECANOATE) 100 MG/ML injection; Inject 1 mL (100 mg total) into the muscle every 28 (twenty-eight) days.  Dispense: 1 mL; Refill: 11 Continue- nicotine (NICODERM CQ - DOSED IN MG/24 HOURS) 14 mg/24hr patch; Place 1 patch (14 mg total) onto the skin daily.  Dispense: 28 patch; Refill: 0 Continue- divalproex (DEPAKOTE ER) 250 MG 24 hr tablet; Take 3 tablets (750 mg total) by mouth at bedtime.  Dispense: 90 tablet; Refill: 2   2. Tardive dyskinesia  Start- Deutetrabenazine (AUSTEDO) 12 MG TABS; Take 18 mg by mouth 2 (two) times daily.  Dispense: 60 tablet; Refill: 2 Follow-up in 3 months   Shanna Cisco, NP 8/11/20214:33 PM

## 2020-06-08 NOTE — Progress Notes (Signed)
Walk in accompanied by his wife who he referred to as his girlfriend. States he was referred here by urgent care to get his shot and he has his Haldol D with him. He states he was formerly a patient at [facility] and they tried to find it twice and couldn't and wife states she was about to melt down when they got here, afraid we wouldn't give his shot to him and he was due on Aug 1. Shun is a loud, monotone speaker.  He is disheveled. Good eye contact. His memory is impaired and he is a poor historian. Wife acknowledges she is also a patient. He states when he doesn't get his shot he cant drive his car, but denies any other sx and when asked if he has a hx of A or V hall said no. Wife states he has a hx of strokes and heart attacks, last event was in 2002. Unsure what medications he takes other than the Haldol D and he has with him a bottle of Cogentin. Ms Doyne Keel NP assessed him due to this is a first time appt and a walk in. She started him on Austedo for his movements. He got Haldol D 100 mg in his R deltoid. He is to return in 3 months for appt with NP and in one month with RN for next injection.

## 2020-06-22 ENCOUNTER — Telehealth (HOSPITAL_COMMUNITY): Payer: Self-pay | Admitting: *Deleted

## 2020-06-22 NOTE — Telephone Encounter (Signed)
Had a prior authorization request for his Asutedo which was denied. Did file a challenge and it was denied.

## 2020-07-06 ENCOUNTER — Ambulatory Visit (INDEPENDENT_AMBULATORY_CARE_PROVIDER_SITE_OTHER): Payer: Medicare Other | Admitting: *Deleted

## 2020-07-06 ENCOUNTER — Other Ambulatory Visit: Payer: Self-pay

## 2020-07-06 ENCOUNTER — Encounter (HOSPITAL_COMMUNITY): Payer: Self-pay

## 2020-07-06 VITALS — BP 105/61 | HR 95 | Ht 67.75 in | Wt 150.0 lb

## 2020-07-06 DIAGNOSIS — F3162 Bipolar disorder, current episode mixed, moderate: Secondary | ICD-10-CM

## 2020-07-06 MED ORDER — HALOPERIDOL DECANOATE 100 MG/ML IM SOLN
100.0000 mg | INTRAMUSCULAR | Status: AC
  Administered 2020-07-06 – 2020-08-08 (×2): 100 mg via INTRAMUSCULAR

## 2020-07-06 NOTE — Progress Notes (Signed)
Patient arrived for monthly Injection with wife. Patient didn't have his own medicine .Sttated he didn't know to bring it.  Reminded him every time he comes he should go by his Rx to pick his medication to bring with him for his injection. Also explained to wife.  Patient tolerated injection well in Left Deltoid Haloperidol Decanoate 100 mg * 1 ml

## 2020-08-03 ENCOUNTER — Ambulatory Visit (HOSPITAL_COMMUNITY): Payer: Medicare Other

## 2020-08-04 ENCOUNTER — Ambulatory Visit (HOSPITAL_COMMUNITY): Payer: Medicare Other

## 2020-08-08 ENCOUNTER — Other Ambulatory Visit: Payer: Self-pay

## 2020-08-08 ENCOUNTER — Encounter (HOSPITAL_COMMUNITY): Payer: Self-pay

## 2020-08-08 ENCOUNTER — Ambulatory Visit (HOSPITAL_COMMUNITY): Payer: Medicare Other | Admitting: *Deleted

## 2020-08-08 VITALS — BP 160/72 | HR 92 | Temp 98.1°F | Ht 67.0 in | Wt 151.0 lb

## 2020-08-08 DIAGNOSIS — F3162 Bipolar disorder, current episode mixed, moderate: Secondary | ICD-10-CM

## 2020-08-08 NOTE — Progress Notes (Signed)
Patient ID: Bryan Davies, male   DOB: February 25, 1951, 69 y.o.   MRN: [id number] In late for his monthly injection. He denies any problems. States he sleeps sometimes 14 hours a day. When asked why he sleeps so much he says because he is retired and he has earned it. His wife accompanied him and believes he needs an [facility] team because he needs more care than she can provide because she herself is sick. She reports she was just in the hospital herself. Also, he is not taking all of his po meds. She thinks he may need a different living situation. Provided her the number for [facility] to get a referral from them for an ACT. Gave him his shot in his R deltoid without difficulty. He states he likes coming here and doesn't want to change his service. I believe he would benefit from more intensive services than we offer. He did bring his Haldol for his shot today. To return in one month.

## 2020-08-10 ENCOUNTER — Emergency Department (HOSPITAL_COMMUNITY): Payer: Medicare Other

## 2020-08-10 ENCOUNTER — Other Ambulatory Visit (HOSPITAL_COMMUNITY): Payer: Self-pay | Admitting: Psychiatry

## 2020-08-10 ENCOUNTER — Encounter (HOSPITAL_COMMUNITY): Payer: Self-pay | Admitting: Emergency Medicine

## 2020-08-10 ENCOUNTER — Other Ambulatory Visit: Payer: Self-pay

## 2020-08-10 ENCOUNTER — Emergency Department (HOSPITAL_COMMUNITY)
Admission: EM | Admit: 2020-08-10 | Discharge: 2020-08-11 | Disposition: A | Discharge status: Home / Self Care | Payer: Medicare Other | Attending: Emergency Medicine | Admitting: Emergency Medicine

## 2020-08-10 DIAGNOSIS — Z7982 Long term (current) use of aspirin: Secondary | ICD-10-CM | POA: Diagnosis not present

## 2020-08-10 DIAGNOSIS — Z79899 Other long term (current) drug therapy: Secondary | ICD-10-CM | POA: Diagnosis not present

## 2020-08-10 DIAGNOSIS — R079 Chest pain, unspecified: Secondary | ICD-10-CM | POA: Insufficient documentation

## 2020-08-10 DIAGNOSIS — F17210 Nicotine dependence, cigarettes, uncomplicated: Secondary | ICD-10-CM | POA: Insufficient documentation

## 2020-08-10 DIAGNOSIS — F3162 Bipolar disorder, current episode mixed, moderate: Secondary | ICD-10-CM

## 2020-08-10 DIAGNOSIS — I10 Essential (primary) hypertension: Secondary | ICD-10-CM | POA: Diagnosis not present

## 2020-08-10 LAB — BASIC METABOLIC PANEL
Anion gap: 13 (ref 5–15)
BUN: 16 mg/dL (ref 8–23)
CO2: 23 mmol/L (ref 22–32)
Calcium: 9 mg/dL (ref 8.9–10.3)
Chloride: 106 mmol/L (ref 98–111)
Creatinine, Ser: 0.93 mg/dL (ref 0.61–1.24)
GFR, Estimated: >60 mL/min (ref >60)
Glucose, Bld: 144 mg/dL — ABNORMAL HIGH (ref 70–99)
Potassium: 3.1 mmol/L — ABNORMAL LOW (ref 3.5–5.1)
Sodium: 142 mmol/L (ref 135–145)

## 2020-08-10 LAB — CBC
HCT: 43.3 % (ref 39.0–52.0)
Hemoglobin: 14.2 g/dL (ref 13.0–17.0)
MCH: 30.8 pg (ref 26.0–34.0)
MCHC: 32.8 g/dL (ref 30.0–36.0)
MCV: 93.9 fL (ref 80.0–100.0)
Platelets: 154 10*3/uL (ref 150–400)
RBC: 4.61 MIL/uL (ref 4.22–5.81)
RDW: 12.9 % (ref 11.5–15.5)
WBC: 8.9 10*3/uL (ref 4.0–10.5)
nRBC: 0 % (ref 0.0–0.2)

## 2020-08-10 LAB — TROPONIN I (HIGH SENSITIVITY): Troponin I (High Sensitivity): 3 ng/L (ref <18)

## 2020-08-10 NOTE — ED Provider Notes (Signed)
[facility] Provider Note   CSN: [id number] Arrival date & time: 08/10/20  1845     History Chief Complaint  Patient presents with  . Chest Pain    Bryan Davies is a 69 y.o. male.  Patient presents to the emergency department for evaluation of chest pain.  Patient reports that he developed central chest pain while walking earlier today.  He reports that he was walking to [employer], approximately three quarters of a mile.  He says that the pain made him have to walk slower which seemed to help.  He walks often and does not get chest pain every time but does report that he sometimes gets pain with walking.  At time of evaluation he reports that he is not experiencing pain.     HPI: A 69 year old patient with a history of hypertension presents for evaluation of chest pain. Initial onset of pain was approximately 1-3 hours ago. The patient's chest pain is well-localized, is sharp and is worse with exertion. The patient's chest pain is middle- or left-sided, is not described as heaviness/pressure/tightness and does not radiate to the arms/jaw/neck. The patient does not complain of nausea and denies diaphoresis. The patient has smoked in the past 90 days. The patient has no history of stroke, has no history of peripheral artery disease, denies any history of treated diabetes, has no relevant family history of coronary artery disease (first degree relative at less than age 43), has no history of hypercholesterolemia and does not have an elevated BMI (>=30).   Past Medical History:  Diagnosis Date  . Anxiety   . Hypertension   . Shingles 2013  . Shoulder pain     Patient Active Problem List   Diagnosis Date Noted  . Bipolar 1 disorder, mixed, moderate (HCC) 06/08/2020  . Tardive dyskinesia 06/08/2020  . Bipolar 1 disorder (HCC) 05/27/2019  . 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 Ischemic stroke (HCC) 01/15/2019  . Left shoulder pain  12/05/2015  . Urinary hesitancy 12/05/2015  . Pain in lower jaw 07/11/2015  . Hyperlipidemia 02/11/2014  . 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 Dyshydrosis 07/26/2013  . 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 Hypertension 05/21/2012  . Post herpetic neuralgia 05/21/2012  . Bipolar disorder (HCC) 05/21/2012  . Tobacco abuse 05/21/2012    Past Surgical History:  Procedure Laterality Date  . IR ANGIO VERTEBRAL SEL SUBCLAVIAN INNOMINATE UNI R MOD SED  01/16/2019  . IR CT HEAD LTD  01/16/2019  . IR INTRAVSC STENT CERV CAROTID W/O EMB-PROT MOD SED INC ANGIO  01/16/2019  . IR PERCUTANEOUS ART THROMBECTOMY/INFUSION INTRACRANIAL INC DIAG ANGIO  01/16/2019  . RADIOLOGY WITH ANESTHESIA N/A 01/15/2019   Procedure: IR WITH ANESTHESIA;  Surgeon: Julieanne Cotton, MD;  Location: [facility] OR;  Service: Radiology;  Laterality: N/A;       No family history on file.  Social History   Tobacco Use  . Smoking status: Current Every Day Smoker    Packs/day: 2.00    Years: 50.00    Pack years: 100.00    Types: Cigarettes  . Smokeless tobacco: Never Used  . Tobacco comment: 10  cigarettes per day, waits an hour between  Vaping Use  . Vaping Use: Never used  Substance Use Topics  . Alcohol use: No  . Drug use: No    Home Medications Prior to Admission medications   Medication Sig Start Date End Date Taking? Authorizing Provider  amLODipine (NORVASC) 5 MG tablet  Take 1 tablet (5 mg total) by mouth daily. 07/25/16   Shon Hale, MD  aspirin 81 MG chewable tablet Chew 1 tablet (81 mg total) by mouth daily. 01/18/19   Rinehuls, Kinnie Scales, PA-C  atorvastatin (LIPITOR) 40 MG tablet Take 1 tablet (40 mg total) by mouth daily. 01/18/19   Rinehuls, Kinnie Scales, PA-C  benztropine (COGENTIN) 0.5 MG tablet Take 1 tablet (0.5 mg total) by mouth 2 (two) times daily. 06/01/19   Malvin Johns, MD  Deutetrabenazine (AUSTEDO) 12 MG TABS Take 18 mg by mouth 2 (two) times daily. 06/08/20   Shanna Cisco, NP   divalproex (DEPAKOTE ER) 250 MG 24 hr tablet TAKE THREE TABLETS (750 MG TOTAL) BY MOUTH AT BEDTIME. 08/10/20   Toy Cookey E, NP  gabapentin (NEURONTIN) 300 MG capsule Take 1 capsule (300 mg total) by mouth 2 (two) times daily. 06/08/20   Shanna Cisco, NP  losartan (COZAAR) 50 MG tablet Take 1 tablet (50 mg total) by mouth daily. 06/01/19   Malvin Johns, MD  meloxicam (MOBIC) 7.5 MG tablet Take 1 tablet (7.5 mg total) by mouth daily. 08/09/19   Bast, Gloris Manchester A, NP  naphazoline-pheniramine (NAPHCON-A) 0.025-0.3 % ophthalmic solution Place 2 drops into the left eye 4 (four) times daily as needed for eye irritation. 08/12/19   Fawze, Mina A, PA-C  nicotine (NICODERM CQ - DOSED IN MG/24 HOURS) 14 mg/24hr patch Place 1 patch (14 mg total) onto the skin daily. 06/08/20   Shanna Cisco, NP  sennosides-docusate sodium (SENOKOT-S) 8.6-50 MG tablet Take 2 tablets by mouth daily as needed for constipation.    [provider]  tamsulosin (FLOMAX) 0.4 MG CAPS capsule Take 0.4 mg by mouth daily. 03/27/19   [provider]  temazepam (RESTORIL) 15 MG capsule Take 1 capsule (15 mg total) by mouth at bedtime. 06/01/19   Malvin Johns, MD  ticagrelor (BRILINTA) 90 MG TABS tablet Take 1 tablet (90 mg total) by mouth 2 (two) times daily. 01/18/19   Rinehuls, Kinnie Scales, PA-C    Allergies    Pollen extract  Review of Systems   Review of Systems  Cardiovascular: Positive for chest pain.  All other systems reviewed and are negative.   Physical Exam Updated Vital Signs BP (!) 151/73   Pulse 88   Temp 98.9 F (37.2 C) (Oral)   Resp 16   Ht 5\' 7"  (1.702 m)   Wt 67.1 kg   SpO2 95%   BMI 23.18 kg/m   Physical Exam Vitals and nursing note reviewed.  Constitutional:      General: He is not in acute distress.    Appearance: Normal appearance. He is well-developed.  HENT:     Head: Normocephalic and atraumatic.     Right Ear: Hearing normal.     Left Ear: Hearing normal.     Nose:  Nose normal.  Eyes:     Conjunctiva/sclera: Conjunctivae normal.     Pupils: Pupils are equal, round, and reactive to light.  Cardiovascular:     Rate and Rhythm: Regular rhythm.     Heart sounds: S1 normal and S2 normal. No murmur heard.  No friction rub. No gallop.   Pulmonary:     Effort: Pulmonary effort is normal. No respiratory distress.     Breath sounds: Normal breath sounds.  Chest:     Chest wall: Tenderness present.    Abdominal:     General: Bowel sounds are normal.     Palpations:  Abdomen is soft.     Tenderness: There is no abdominal tenderness. There is no guarding or rebound. Negative signs include Murphy's sign and McBurney's sign.     Hernia: No hernia is present.  Musculoskeletal:        General: Normal range of motion.     Cervical back: Normal range of motion and neck supple.  Skin:    General: Skin is warm and dry.     Findings: No rash.  Neurological:     Mental Status: He is alert and oriented to person, place, and time.     GCS: GCS eye subscore is 4. GCS verbal subscore is 5. GCS motor subscore is 6.     Cranial Nerves: No cranial nerve deficit.     Sensory: No sensory deficit.     Coordination: Coordination normal.  Psychiatric:        Speech: Speech normal.        Behavior: Behavior normal.        Thought Content: Thought content normal.     ED Results / Procedures / Treatments   Labs (all labs ordered are listed, but only abnormal results are displayed) Labs Reviewed  BASIC METABOLIC PANEL - Abnormal; Notable for the following components:      Result Value   Potassium 3.1 (*)    Glucose, Bld 144 (*)    All other components within normal limits  CBC  TROPONIN I (HIGH SENSITIVITY)  TROPONIN I (HIGH SENSITIVITY)    EKG EKG Interpretation  Date/Time:  Wednesday August 10 2020 19:03:17 EDT Ventricular Rate:  89 PR Interval:    QRS Duration: 95 QT Interval:  387 QTC Calculation: 471 R Axis:   -8 Text Interpretation: Sinus rhythm  Probable left atrial enlargement S1,S2,S3 pattern Borderline ST elevation, anterior leads 12 Lead; Mason-Likar No significant change since last tracing Confirmed by Gilda Crease [id number]) on 08/10/2020 11:37:36 PM   Radiology DG Chest 2 View  Result Date: 08/10/2020 CLINICAL DATA:  Patient states he is walking slow today and his chest hurts. Endorses chest pain that started when he was walking to the [employer] today around 5pm, also endorses SO EXAM: CHEST - 2 VIEW COMPARISON:  Chest x-ray 05/18/2020 FINDINGS: The heart size and mediastinal contours are within normal limits. Aortic arch calcifications. No focal consolidation. Biapical pleural/pulmonary scarring is again noted. Similar-appearing peribronchial thickening within the hilar regions. Increased interstitial markings again noted with no overt pulmonary edema. No pleural effusion. No pneumothorax. No acute osseous abnormality. IMPRESSION: No active cardiopulmonary disease. Electronically Signed   By: Tish Frederickson M.D.   On: 08/10/2020 20:12    Procedures Procedures (including critical care time)  Medications Ordered in ED Medications - No data to display  ED Course  I have reviewed the triage vital signs and the nursing notes.  Pertinent labs & imaging results that were available during my care of the patient were reviewed by me and considered in my medical decision making (see chart for details).    MDM Rules/Calculators/A&P HEAR Score: 4                        Patient presents to the emergency department for evaluation of chest pain.  Patient reports that he noticed pain in his chest when he was walking earlier today.  Patient reports point tenderness and pain just to the left of the sternal border.  Examination does reveal reproducible pain.  Pain began around 5 PM.  Hear score is 4.  He is not currently having pain except when the area is pressed.  He does have some cardiac risk factors but no known history of heart  disease.  Troponins are normal.  Algorithm states that patient can be safely discharged with prompt follow-up.  Will refer to cardiology.  Return for any recurrent or worsening symptoms.  Final Clinical Impression(s) / ED Diagnoses Final diagnoses:  Chest pain, unspecified type    Rx / DC Orders ED Discharge Orders    None       Xzavier Swinger, Canary Brimhristopher J, MD 08/11/20 [id number]

## 2020-08-10 NOTE — ED Triage Notes (Signed)
Patient states he is walking slow today and his chest hurts. Endorses chest pain that started when he was walking to the [employer] today around 5pm, also endorses SOB.

## 2020-08-11 LAB — TROPONIN I (HIGH SENSITIVITY): Troponin I (High Sensitivity): 4 ng/L (ref <18)

## 2020-08-24 ENCOUNTER — Telehealth (HOSPITAL_COMMUNITY): Payer: Self-pay | Admitting: *Deleted

## 2020-08-24 ENCOUNTER — Other Ambulatory Visit (HOSPITAL_COMMUNITY): Payer: Self-pay | Admitting: Psychiatry

## 2020-08-24 DIAGNOSIS — G2401 Drug induced subacute dyskinesia: Secondary | ICD-10-CM

## 2020-08-24 MED ORDER — AUSTEDO 9 MG PO TABS: 18.0000 mg | ORAL_TABLET | Freq: Two times a day (BID) | ORAL | 2 refills | Status: DC

## 2020-08-24 NOTE — Telephone Encounter (Signed)
Received a fax from [facility] re notice of his authorization exp for his Austedo. Called the number provided on the form because I didn't remember him ever getting auth but rather being rejected for it. He does however have coverage on it till Nov 26th.

## 2020-08-24 NOTE — Telephone Encounter (Signed)
Provider filled Austedo 18 mg twice daily and sent to preferred pharmacy.

## 2020-08-31 ENCOUNTER — Inpatient Hospital Stay (HOSPITAL_COMMUNITY)
Admission: EM | Admit: 2020-08-31 | Discharge: 2020-09-07 | DRG: 558 | Disposition: A | Discharge status: Skilled Nursing Facility | Payer: Medicare Other | Attending: Internal Medicine | Admitting: Internal Medicine

## 2020-08-31 ENCOUNTER — Emergency Department (HOSPITAL_COMMUNITY): Payer: Medicare Other

## 2020-08-31 DIAGNOSIS — Z95820 Peripheral vascular angioplasty status with implants and grafts: Secondary | ICD-10-CM

## 2020-08-31 DIAGNOSIS — Z791 Long term (current) use of non-steroidal anti-inflammatories (NSAID): Secondary | ICD-10-CM

## 2020-08-31 DIAGNOSIS — Z7982 Long term (current) use of aspirin: Secondary | ICD-10-CM

## 2020-08-31 DIAGNOSIS — E7220 Disorder of urea cycle metabolism, unspecified: Secondary | ICD-10-CM | POA: Diagnosis present

## 2020-08-31 DIAGNOSIS — Z20822 Contact with and (suspected) exposure to covid-19: Secondary | ICD-10-CM | POA: Diagnosis present

## 2020-08-31 DIAGNOSIS — W010XXA Fall on same level from slipping, tripping and stumbling without subsequent striking against object, initial encounter: Secondary | ICD-10-CM | POA: Diagnosis present

## 2020-08-31 DIAGNOSIS — I10 Essential (primary) hypertension: Secondary | ICD-10-CM | POA: Diagnosis present

## 2020-08-31 DIAGNOSIS — F319 Bipolar disorder, unspecified: Secondary | ICD-10-CM | POA: Diagnosis present

## 2020-08-31 DIAGNOSIS — M75122 Complete rotator cuff tear or rupture of left shoulder, not specified as traumatic: Secondary | ICD-10-CM | POA: Diagnosis not present

## 2020-08-31 DIAGNOSIS — J439 Emphysema, unspecified: Secondary | ICD-10-CM | POA: Diagnosis present

## 2020-08-31 DIAGNOSIS — S80211A Abrasion, right knee, initial encounter: Secondary | ICD-10-CM | POA: Diagnosis present

## 2020-08-31 DIAGNOSIS — F419 Anxiety disorder, unspecified: Secondary | ICD-10-CM | POA: Diagnosis present

## 2020-08-31 DIAGNOSIS — R531 Weakness: Secondary | ICD-10-CM | POA: Diagnosis not present

## 2020-08-31 DIAGNOSIS — W19XXXA Unspecified fall, initial encounter: Secondary | ICD-10-CM

## 2020-08-31 DIAGNOSIS — R414 Neurologic neglect syndrome: Secondary | ICD-10-CM | POA: Diagnosis present

## 2020-08-31 DIAGNOSIS — S00412A Abrasion of left ear, initial encounter: Secondary | ICD-10-CM | POA: Diagnosis present

## 2020-08-31 DIAGNOSIS — Z23 Encounter for immunization: Secondary | ICD-10-CM

## 2020-08-31 DIAGNOSIS — Z79899 Other long term (current) drug therapy: Secondary | ICD-10-CM

## 2020-08-31 DIAGNOSIS — E785 Hyperlipidemia, unspecified: Secondary | ICD-10-CM | POA: Diagnosis present

## 2020-08-31 DIAGNOSIS — I69354 Hemiplegia and hemiparesis following cerebral infarction affecting left non-dominant side: Secondary | ICD-10-CM

## 2020-08-31 DIAGNOSIS — E860 Dehydration: Secondary | ICD-10-CM | POA: Diagnosis present

## 2020-08-31 DIAGNOSIS — B957 Other staphylococcus as the cause of diseases classified elsewhere: Secondary | ICD-10-CM | POA: Diagnosis present

## 2020-08-31 DIAGNOSIS — H6012 Cellulitis of left external ear: Secondary | ICD-10-CM | POA: Diagnosis present

## 2020-08-31 DIAGNOSIS — N179 Acute kidney failure, unspecified: Secondary | ICD-10-CM | POA: Diagnosis present

## 2020-08-31 DIAGNOSIS — L03313 Cellulitis of chest wall: Secondary | ICD-10-CM | POA: Diagnosis present

## 2020-08-31 DIAGNOSIS — Z8619 Personal history of other infectious and parasitic diseases: Secondary | ICD-10-CM

## 2020-08-31 DIAGNOSIS — R7881 Bacteremia: Secondary | ICD-10-CM | POA: Diagnosis present

## 2020-08-31 DIAGNOSIS — N400 Enlarged prostate without lower urinary tract symptoms: Secondary | ICD-10-CM | POA: Diagnosis present

## 2020-08-31 DIAGNOSIS — I214 Non-ST elevation (NSTEMI) myocardial infarction: Secondary | ICD-10-CM

## 2020-08-31 DIAGNOSIS — R778 Other specified abnormalities of plasma proteins: Secondary | ICD-10-CM | POA: Diagnosis present

## 2020-08-31 DIAGNOSIS — G9340 Encephalopathy, unspecified: Secondary | ICD-10-CM

## 2020-08-31 DIAGNOSIS — M6282 Rhabdomyolysis: Secondary | ICD-10-CM

## 2020-08-31 DIAGNOSIS — R4781 Slurred speech: Secondary | ICD-10-CM | POA: Diagnosis present

## 2020-08-31 DIAGNOSIS — R52 Pain, unspecified: Secondary | ICD-10-CM

## 2020-08-31 DIAGNOSIS — S80212A Abrasion, left knee, initial encounter: Secondary | ICD-10-CM | POA: Diagnosis present

## 2020-08-31 DIAGNOSIS — E870 Hyperosmolality and hypernatremia: Secondary | ICD-10-CM | POA: Diagnosis not present

## 2020-08-31 DIAGNOSIS — F17210 Nicotine dependence, cigarettes, uncomplicated: Secondary | ICD-10-CM | POA: Diagnosis present

## 2020-08-31 DIAGNOSIS — R7989 Other specified abnormal findings of blood chemistry: Secondary | ICD-10-CM | POA: Diagnosis present

## 2020-08-31 DIAGNOSIS — Y92019 Unspecified place in single-family (private) house as the place of occurrence of the external cause: Secondary | ICD-10-CM

## 2020-08-31 LAB — CBC WITH DIFFERENTIAL/PLATELET
Abs Immature Granulocytes: 0.17 10*3/uL — ABNORMAL HIGH (ref 0.00–0.07)
Basophils Absolute: 0 10*3/uL (ref 0.0–0.1)
Basophils Relative: 0 %
Eosinophils Absolute: 0 10*3/uL (ref 0.0–0.5)
Eosinophils Relative: 0 %
HCT: 48.8 % (ref 39.0–52.0)
Hemoglobin: 16.5 g/dL (ref 13.0–17.0)
Immature Granulocytes: 1 %
Lymphocytes Relative: 5 %
Lymphs Abs: 1 10*3/uL (ref 0.7–4.0)
MCH: 30.9 pg (ref 26.0–34.0)
MCHC: 33.8 g/dL (ref 30.0–36.0)
MCV: 91.4 fL (ref 80.0–100.0)
Monocytes Absolute: 1.2 10*3/uL — ABNORMAL HIGH (ref 0.1–1.0)
Monocytes Relative: 6 %
Neutro Abs: 17 10*3/uL — ABNORMAL HIGH (ref 1.7–7.7)
Neutrophils Relative %: 88 %
Platelets: 212 10*3/uL (ref 150–400)
RBC: 5.34 MIL/uL (ref 4.22–5.81)
RDW: 12.9 % (ref 11.5–15.5)
WBC: 19.4 10*3/uL — ABNORMAL HIGH (ref 4.0–10.5)
nRBC: 0 % (ref 0.0–0.2)

## 2020-08-31 LAB — RESPIRATORY PANEL BY RT PCR (FLU A&B, COVID)
Influenza A by PCR: NEGATIVE
Influenza B by PCR: NEGATIVE
SARS Coronavirus 2 by RT PCR: NEGATIVE

## 2020-08-31 LAB — I-STAT CHEM 8, ED
BUN: 78 mg/dL — ABNORMAL HIGH (ref 8–23)
Calcium, Ion: 0.99 mmol/L — ABNORMAL LOW (ref 1.15–1.40)
Chloride: 111 mmol/L (ref 98–111)
Creatinine, Ser: 1.3 mg/dL — ABNORMAL HIGH (ref 0.61–1.24)
Glucose, Bld: 148 mg/dL — ABNORMAL HIGH (ref 70–99)
HCT: 38 % — ABNORMAL LOW (ref 39.0–52.0)
Hemoglobin: 12.9 g/dL — ABNORMAL LOW (ref 13.0–17.0)
Potassium: 4.2 mmol/L (ref 3.5–5.1)
Sodium: 146 mmol/L — ABNORMAL HIGH (ref 135–145)
TCO2: 24 mmol/L (ref 22–32)

## 2020-08-31 LAB — COMPREHENSIVE METABOLIC PANEL
ALT: 471 U/L — ABNORMAL HIGH (ref 0–44)
AST: 388 U/L — ABNORMAL HIGH (ref 15–41)
Albumin: 3.7 g/dL (ref 3.5–5.0)
Alkaline Phosphatase: 73 U/L (ref 38–126)
Anion gap: 17 — ABNORMAL HIGH (ref 5–15)
BUN: 80 mg/dL — ABNORMAL HIGH (ref 8–23)
CO2: 19 mmol/L — ABNORMAL LOW (ref 22–32)
Calcium: 9.4 mg/dL (ref 8.9–10.3)
Chloride: 110 mmol/L (ref 98–111)
Creatinine, Ser: 1.5 mg/dL — ABNORMAL HIGH (ref 0.61–1.24)
GFR, Estimated: 51 mL/min — ABNORMAL LOW (ref >60)
Glucose, Bld: 183 mg/dL — ABNORMAL HIGH (ref 70–99)
Potassium: 4.8 mmol/L (ref 3.5–5.1)
Sodium: 146 mmol/L — ABNORMAL HIGH (ref 135–145)
Total Bilirubin: 1.8 mg/dL — ABNORMAL HIGH (ref 0.3–1.2)
Total Protein: 7.2 g/dL (ref 6.5–8.1)

## 2020-08-31 LAB — AMMONIA: Ammonia: 62 umol/L — ABNORMAL HIGH (ref 9–35)

## 2020-08-31 LAB — TROPONIN I (HIGH SENSITIVITY)
Troponin I (High Sensitivity): 421 ng/L (ref <18)
Troponin I (High Sensitivity): 407 ng/L (ref <18)

## 2020-08-31 LAB — CBG MONITORING, ED: Glucose-Capillary: 178 mg/dL — ABNORMAL HIGH (ref 70–99)

## 2020-08-31 LAB — VALPROIC ACID LEVEL: Valproic Acid Lvl: 11 ug/mL — ABNORMAL LOW (ref 50.0–100.0)

## 2020-08-31 LAB — ETHANOL: Alcohol, Ethyl (B): <10 mg/dL (ref <10)

## 2020-08-31 MED ORDER — IOHEXOL 350 MG/ML SOLN
100.0000 mL | Freq: Once | INTRAVENOUS | Status: AC | PRN
  Administered 2020-08-31: 100 mL via INTRAVENOUS

## 2020-08-31 MED ORDER — LACTATED RINGERS IV BOLUS
1000.0000 mL | Freq: Once | INTRAVENOUS | Status: AC
  Administered 2020-08-31: 1000 mL via INTRAVENOUS

## 2020-08-31 NOTE — ED Triage Notes (Signed)
Pt arrives to ED BIB [facility] for Stroke Sx. Per EMS Pt's family last spoke with pt on October 31st. Per EMS pt's girl friend found pt today face down on the floor. Per EMS pt has Lf sided deficit including weakness of both Lf arm and leg with slurred speech. Hx of Stroke and Hypotension. Pt A/O x4.  BP 108/60 HR 110 R 20  97% RA CBG 154

## 2020-08-31 NOTE — ED Notes (Signed)
Pt to CT via stretcher

## 2020-08-31 NOTE — ED Provider Notes (Signed)
[facility] EMERGENCY DEPARTMENT Provider Note   CSN: [id number] Arrival date & time: 08/31/20  2012     History Chief Complaint  Patient presents with  . Stroke Symptoms    Bryan Davies is a 69 y.o. male. Level 5 caveat due to altered mental status. HPI Patient presents with weakness.  Reportedly found facedown on the floor today.  Patient really cannot provide much history.  Reportedly was last seen on October 31.  Patient states he was up and moving around today states he did get some water.  Unsure about falling.  States he is weak on the left side.  States that comes and goes somewhat.  Cannot clearly get from him what time he was last completely normal.    Past Medical History:  Diagnosis Date  . Anxiety   . Hypertension   . Shingles 2013  . Shoulder pain     Patient Active Problem List   Diagnosis Date Noted  . Bipolar 1 disorder, mixed, moderate (HCC) 06/08/2020  . Tardive dyskinesia 06/08/2020  . Bipolar 1 disorder (HCC) 05/27/2019  . 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 Ischemic stroke (HCC) 01/15/2019  . Left shoulder pain 12/05/2015  . Urinary hesitancy 12/05/2015  . Pain in lower jaw 07/11/2015  . Hyperlipidemia 02/11/2014  . 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 Dyshydrosis 07/26/2013  . 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 Hypertension 05/21/2012  . Post herpetic neuralgia 05/21/2012  . Bipolar disorder (HCC) 05/21/2012  . Tobacco abuse 05/21/2012    Past Surgical History:  Procedure Laterality Date  . IR ANGIO VERTEBRAL SEL SUBCLAVIAN INNOMINATE UNI R MOD SED  01/16/2019  . IR CT HEAD LTD  01/16/2019  . IR INTRAVSC STENT CERV CAROTID W/O EMB-PROT MOD SED INC ANGIO  01/16/2019  . IR PERCUTANEOUS ART THROMBECTOMY/INFUSION INTRACRANIAL INC DIAG ANGIO  01/16/2019  . RADIOLOGY WITH ANESTHESIA N/A 01/15/2019   Procedure: IR WITH ANESTHESIA;  Surgeon: Julieanne Cotton, MD;  Location: [facility] OR;  Service: Radiology;   Laterality: N/A;       No family history on file.  Social History   Tobacco Use  . Smoking status: Current Every Day Smoker    Packs/day: 2.00    Years: 50.00    Pack years: 100.00    Types: Cigarettes  . Smokeless tobacco: Never Used  . Tobacco comment: 10  cigarettes per day, waits an hour between  Vaping Use  . Vaping Use: Never used  Substance Use Topics  . Alcohol use: No  . Drug use: No    Home Medications Prior to Admission medications   Medication Sig Start Date End Date Taking? Authorizing Provider  amLODipine (NORVASC) 5 MG tablet Take 1 tablet (5 mg total) by mouth daily. 07/25/16  Yes Shon Hale, MD  aspirin 81 MG chewable tablet Chew 1 tablet (81 mg total) by mouth daily. 01/18/19  Yes Rinehuls, Kinnie Scales, PA-C  atorvastatin (LIPITOR) 40 MG tablet Take 1 tablet (40 mg total) by mouth daily. 01/18/19  Yes Rinehuls, Kinnie Scales, PA-C  Deutetrabenazine (AUSTEDO) 9 MG TABS Take 18 mg by mouth 2 (two) times daily. 08/24/20  Yes Toy Cookey E, NP  divalproex (DEPAKOTE ER) 250 MG 24 hr tablet TAKE THREE TABLETS (750 MG TOTAL) BY MOUTH AT BEDTIME. Patient taking differently: Take 750 mg by mouth at bedtime.  08/10/20  Yes Toy Cookey E, NP  gabapentin (NEURONTIN) 300 MG capsule Take 1 capsule (300 mg total) by mouth 2 (  two) times daily. 06/08/20  Yes Toy Cookey E, NP  losartan (COZAAR) 50 MG tablet Take 1 tablet (50 mg total) by mouth daily. 06/01/19  Yes Malvin Johns, MD  meloxicam (MOBIC) 7.5 MG tablet Take 1 tablet (7.5 mg total) by mouth daily. 08/09/19  Yes Bast, Traci A, NP  tamsulosin (FLOMAX) 0.4 MG CAPS capsule Take 0.4 mg by mouth daily. 03/27/19  Yes [provider]  ticagrelor (BRILINTA) 90 MG TABS tablet Take 1 tablet (90 mg total) by mouth 2 (two) times daily. 01/18/19  Yes Rinehuls, Kinnie Scales, PA-C  benztropine (COGENTIN) 0.5 MG tablet Take 1 tablet (0.5 mg total) by mouth 2 (two) times daily. Patient not taking: Reported on 08/31/2020  06/01/19   Malvin Johns, MD  naphazoline-pheniramine (NAPHCON-A) 0.025-0.3 % ophthalmic solution Place 2 drops into the left eye 4 (four) times daily as needed for eye irritation. 08/12/19   Fawze, Mina A, PA-C  nicotine (NICODERM CQ - DOSED IN MG/24 HOURS) 14 mg/24hr patch Place 1 patch (14 mg total) onto the skin daily. 06/08/20   Shanna Cisco, NP  sennosides-docusate sodium (SENOKOT-S) 8.6-50 MG tablet Take 2 tablets by mouth daily as needed for constipation.    [provider]  temazepam (RESTORIL) 15 MG capsule Take 1 capsule (15 mg total) by mouth at bedtime. 06/01/19   Malvin Johns, MD    Allergies    Pollen extract  Review of Systems   Review of Systems  Unable to perform ROS: Mental status change    Physical Exam Updated Vital Signs BP 111/65   Pulse 87   Temp (!) 97.4 F (36.3 C) (Oral)   Resp (!) 27   SpO2 98%   Physical Exam Vitals and nursing note reviewed.  HENT:     Head: Atraumatic.     Mouth/Throat:     Mouth: Mucous membranes are dry.  Eyes:     Extraocular Movements: Extraocular movements intact.  Neck:     Comments: Cervical collar in place.  No midline tenderness. Cardiovascular:     Rate and Rhythm: Regular rhythm.  Chest:     Chest wall: No tenderness.  Abdominal:     Tenderness: There is no abdominal tenderness.  Musculoskeletal:        General: No tenderness.     Cervical back: Neck supple.  Skin:    General: Skin is warm.  Neurological:     Mental Status: He is alert.     Comments: Awake but some confusion.  Right upper extremity seems stronger than left upper extremity.  Although strength in left upper extremity is 4+ out of 5.  Able to lift each leg a little bit off the stretcher.  States he does feel little weak all over also.     ED Results / Procedures / Treatments   Labs (all labs ordered are listed, but only abnormal results are displayed) Labs Reviewed  CBC WITH DIFFERENTIAL/PLATELET - Abnormal; Notable for the  following components:      Result Value   WBC 19.4 (*)    Neutro Abs 17.0 (*)    Monocytes Absolute 1.2 (*)    Abs Immature Granulocytes 0.17 (*)    All other components within normal limits  VALPROIC ACID LEVEL - Abnormal; Notable for the following components:   Valproic Acid Lvl 11 (*)    All other components within normal limits  COMPREHENSIVE METABOLIC PANEL - Abnormal; Notable for the following components:   Sodium 146 (*)  CO2 19 (*)    Glucose, Bld 183 (*)    BUN 80 (*)    Creatinine, Ser 1.50 (*)    AST 388 (*)    ALT 471 (*)    Total Bilirubin 1.8 (*)    GFR, Estimated 51 (*)    Anion gap 17 (*)    All other components within normal limits  AMMONIA - Abnormal; Notable for the following components:   Ammonia 62 (*)    All other components within normal limits  CBG MONITORING, ED - Abnormal; Notable for the following components:   Glucose-Capillary 178 (*)    All other components within normal limits  I-STAT CHEM 8, ED - Abnormal; Notable for the following components:   Sodium 146 (*)    BUN 78 (*)    Creatinine, Ser 1.30 (*)    Glucose, Bld 148 (*)    Calcium, Ion 0.99 (*)    Hemoglobin 12.9 (*)    HCT 38.0 (*)    All other components within normal limits  TROPONIN I (HIGH SENSITIVITY) - Abnormal; Notable for the following components:   Troponin I (High Sensitivity) 421 (*)    All other components within normal limits  TROPONIN I (HIGH SENSITIVITY) - Abnormal; Notable for the following components:   Troponin I (High Sensitivity) 407 (*)    All other components within normal limits  RESPIRATORY PANEL BY RT PCR (FLU A&B, COVID)  ETHANOL  URINALYSIS, ROUTINE W REFLEX MICROSCOPIC  RAPID URINE DRUG SCREEN, HOSP PERFORMED  ACETAMINOPHEN LEVEL  CK    EKG EKG Interpretation  Date/Time:  Wednesday August 31 2020 20:18:31 EDT Ventricular Rate:  108 PR Interval:    QRS Duration: 85 QT Interval:  356 QTC Calculation: 478 R Axis:   -42 Text  Interpretation: Sinus tachycardia Biatrial enlargement Left ventricular hypertrophy ST elevation, consider anterior injury Borderline prolonged QT interval Confirmed by Benjiman CorePickering, Donevin Sainsbury [phone]) on 08/31/2020 8:24:27 PM   Radiology CT Cervical Spine Wo Contrast  Result Date: 08/31/2020 CLINICAL DATA:  Found down, weakness EXAM: CT CERVICAL SPINE WITHOUT CONTRAST TECHNIQUE: Multidetector CT imaging of the cervical spine was performed without intravenous contrast. Multiplanar CT image reconstructions were also generated. COMPARISON:  None. FINDINGS: Alignment: Alignment is anatomic. Skull base and vertebrae: No acute displaced fracture. Soft tissues and spinal canal: No prevertebral fluid or swelling. No visible canal hematoma. Stent is identified within the right carotid artery. Disc levels: Multilevel cervical spondylosis greatest at C4-5, C5-6, and C6-7. Symmetrical neural foraminal encroachment is seen at these levels. Upper chest: Airways patent. Emphysematous changes are seen at the lung apices. Other: Reconstructed images demonstrate no additional findings. IMPRESSION: 1. Lower cervical spondylosis.  No acute fracture. Electronically Signed   By: Sharlet SalinaMichael  Brown M.D.   On: 08/31/2020 22:51   DG Chest Portable 1 View  Result Date: 08/31/2020 CLINICAL DATA:  Altered mental status. Left-sided weakness and slurred speech. EXAM: PORTABLE CHEST 1 VIEW COMPARISON:  Radiograph 08/10/2020 FINDINGS: The cardiomediastinal contours are normal. Atherosclerosis of the thoracic aorta. Improved bronchial thickening from prior exam. Chronic biapical pleuroparenchymal scarring. Pulmonary vasculature is normal. No consolidation, pleural effusion, or pneumothorax. No acute osseous abnormalities are seen. IMPRESSION: No acute chest findings. Electronically Signed   By: Narda RutherfordMelanie  Sanford M.D.   On: 08/31/2020 21:08    Procedures Procedures (including critical care time)  Medications Ordered in ED Medications  lactated  ringers bolus 1,000 mL (1,000 mLs Intravenous New Bag/Given 08/31/20 2239)  iohexol (OMNIPAQUE) 350 MG/ML injection 100 mL (  100 mLs Intravenous Contrast Given 08/31/20 2206)    ED Course  I have reviewed the triage vital signs and the nursing notes.  Pertinent labs & imaging results that were available during my care of the patient were reviewed by me and considered in my medical decision making (see chart for details).    MDM Rules/Calculators/A&P                          Patient brought in for mental status change and left-sided weakness.  Initially difficult to determine last normal.  However patient's girlfriend later arrived.  States that she talk to him on the 31st and he was slurring his speech and thinks that he has been lying on the floor since then.  Patient states she is pretty sure is been at least 2 days but thinks it is probably since Halloween.  Patient really cannot provide too much of the history.  White count is elevated.  Chest x-ray reassuring.  Urine still pending.  Troponin somewhat elevated.  Denies chest pain.  I reviewed imaging and blood work.  Differential diagnosis for mental status change is broad. Final Clinical Impression(s) / ED Diagnoses Final diagnoses:  Encephalopathy  Fall, initial encounter    Rx / DC Orders ED Discharge Orders    None       Benjiman Core, MD 08/31/20 2339

## 2020-09-01 ENCOUNTER — Inpatient Hospital Stay (HOSPITAL_COMMUNITY): Payer: Medicare Other

## 2020-09-01 ENCOUNTER — Encounter (HOSPITAL_COMMUNITY): Payer: Self-pay | Admitting: Internal Medicine

## 2020-09-01 DIAGNOSIS — S80211A Abrasion, right knee, initial encounter: Secondary | ICD-10-CM | POA: Diagnosis present

## 2020-09-01 DIAGNOSIS — R531 Weakness: Secondary | ICD-10-CM | POA: Diagnosis present

## 2020-09-01 DIAGNOSIS — Y92019 Unspecified place in single-family (private) house as the place of occurrence of the external cause: Secondary | ICD-10-CM | POA: Diagnosis not present

## 2020-09-01 DIAGNOSIS — I214 Non-ST elevation (NSTEMI) myocardial infarction: Secondary | ICD-10-CM | POA: Diagnosis not present

## 2020-09-01 DIAGNOSIS — R4781 Slurred speech: Secondary | ICD-10-CM | POA: Diagnosis present

## 2020-09-01 DIAGNOSIS — Z8673 Personal history of transient ischemic attack (TIA), and cerebral infarction without residual deficits: Secondary | ICD-10-CM

## 2020-09-01 DIAGNOSIS — N179 Acute kidney failure, unspecified: Secondary | ICD-10-CM | POA: Diagnosis present

## 2020-09-01 DIAGNOSIS — E860 Dehydration: Secondary | ICD-10-CM | POA: Diagnosis present

## 2020-09-01 DIAGNOSIS — W010XXA Fall on same level from slipping, tripping and stumbling without subsequent striking against object, initial encounter: Secondary | ICD-10-CM | POA: Diagnosis present

## 2020-09-01 DIAGNOSIS — I10 Essential (primary) hypertension: Secondary | ICD-10-CM | POA: Diagnosis present

## 2020-09-01 DIAGNOSIS — R7881 Bacteremia: Secondary | ICD-10-CM | POA: Diagnosis present

## 2020-09-01 DIAGNOSIS — Z23 Encounter for immunization: Secondary | ICD-10-CM | POA: Diagnosis present

## 2020-09-01 DIAGNOSIS — E870 Hyperosmolality and hypernatremia: Secondary | ICD-10-CM | POA: Diagnosis not present

## 2020-09-01 DIAGNOSIS — S00412A Abrasion of left ear, initial encounter: Secondary | ICD-10-CM | POA: Diagnosis present

## 2020-09-01 DIAGNOSIS — G9340 Encephalopathy, unspecified: Secondary | ICD-10-CM | POA: Diagnosis present

## 2020-09-01 DIAGNOSIS — B955 Unspecified streptococcus as the cause of diseases classified elsewhere: Secondary | ICD-10-CM | POA: Diagnosis not present

## 2020-09-01 DIAGNOSIS — I361 Nonrheumatic tricuspid (valve) insufficiency: Secondary | ICD-10-CM

## 2020-09-01 DIAGNOSIS — Z20822 Contact with and (suspected) exposure to covid-19: Secondary | ICD-10-CM | POA: Diagnosis present

## 2020-09-01 DIAGNOSIS — H6012 Cellulitis of left external ear: Secondary | ICD-10-CM | POA: Diagnosis present

## 2020-09-01 DIAGNOSIS — R778 Other specified abnormalities of plasma proteins: Secondary | ICD-10-CM | POA: Diagnosis present

## 2020-09-01 DIAGNOSIS — M6282 Rhabdomyolysis: Secondary | ICD-10-CM | POA: Diagnosis present

## 2020-09-01 DIAGNOSIS — M75122 Complete rotator cuff tear or rupture of left shoulder, not specified as traumatic: Secondary | ICD-10-CM | POA: Diagnosis present

## 2020-09-01 DIAGNOSIS — E7220 Disorder of urea cycle metabolism, unspecified: Secondary | ICD-10-CM | POA: Diagnosis present

## 2020-09-01 DIAGNOSIS — E785 Hyperlipidemia, unspecified: Secondary | ICD-10-CM | POA: Diagnosis present

## 2020-09-01 DIAGNOSIS — S80212A Abrasion, left knee, initial encounter: Secondary | ICD-10-CM | POA: Diagnosis present

## 2020-09-01 DIAGNOSIS — F319 Bipolar disorder, unspecified: Secondary | ICD-10-CM | POA: Diagnosis present

## 2020-09-01 DIAGNOSIS — R7989 Other specified abnormal findings of blood chemistry: Secondary | ICD-10-CM | POA: Diagnosis not present

## 2020-09-01 DIAGNOSIS — R414 Neurologic neglect syndrome: Secondary | ICD-10-CM | POA: Diagnosis present

## 2020-09-01 DIAGNOSIS — L03313 Cellulitis of chest wall: Secondary | ICD-10-CM | POA: Diagnosis present

## 2020-09-01 DIAGNOSIS — J439 Emphysema, unspecified: Secondary | ICD-10-CM | POA: Diagnosis present

## 2020-09-01 DIAGNOSIS — F419 Anxiety disorder, unspecified: Secondary | ICD-10-CM | POA: Diagnosis present

## 2020-09-01 DIAGNOSIS — I69354 Hemiplegia and hemiparesis following cerebral infarction affecting left non-dominant side: Secondary | ICD-10-CM | POA: Diagnosis not present

## 2020-09-01 LAB — URINALYSIS, ROUTINE W REFLEX MICROSCOPIC
Bacteria, UA: NONE SEEN
Bilirubin Urine: NEGATIVE
Glucose, UA: NEGATIVE mg/dL
Ketones, ur: 5 mg/dL — AB
Leukocytes,Ua: NEGATIVE
Nitrite: NEGATIVE
Protein, ur: NEGATIVE mg/dL
Specific Gravity, Urine: >1.046 — ABNORMAL HIGH (ref 1.005–1.030)
pH: 5 (ref 5.0–8.0)

## 2020-09-01 LAB — BASIC METABOLIC PANEL
Anion gap: 12 (ref 5–15)
Anion gap: 10 (ref 5–15)
BUN: 76 mg/dL — ABNORMAL HIGH (ref 8–23)
BUN: 79 mg/dL — ABNORMAL HIGH (ref 8–23)
CO2: 26 mmol/L (ref 22–32)
CO2: 27 mmol/L (ref 22–32)
Calcium: 9.3 mg/dL (ref 8.9–10.3)
Calcium: 9 mg/dL (ref 8.9–10.3)
Chloride: 111 mmol/L (ref 98–111)
Chloride: 110 mmol/L (ref 98–111)
Creatinine, Ser: 1.4 mg/dL — ABNORMAL HIGH (ref 0.61–1.24)
Creatinine, Ser: 1.42 mg/dL — ABNORMAL HIGH (ref 0.61–1.24)
GFR, Estimated: 55 mL/min — ABNORMAL LOW (ref >60)
GFR, Estimated: 54 mL/min — ABNORMAL LOW (ref >60)
Glucose, Bld: 113 mg/dL — ABNORMAL HIGH (ref 70–99)
Glucose, Bld: 171 mg/dL — ABNORMAL HIGH (ref 70–99)
Potassium: 3.9 mmol/L (ref 3.5–5.1)
Potassium: 3.9 mmol/L (ref 3.5–5.1)
Sodium: 149 mmol/L — ABNORMAL HIGH (ref 135–145)
Sodium: 147 mmol/L — ABNORMAL HIGH (ref 135–145)

## 2020-09-01 LAB — CBC WITH DIFFERENTIAL/PLATELET
Abs Immature Granulocytes: 0.13 10*3/uL — ABNORMAL HIGH (ref 0.00–0.07)
Basophils Absolute: 0 10*3/uL (ref 0.0–0.1)
Basophils Relative: 0 %
Eosinophils Absolute: 0 10*3/uL (ref 0.0–0.5)
Eosinophils Relative: 0 %
HCT: 45 % (ref 39.0–52.0)
Hemoglobin: 15.3 g/dL (ref 13.0–17.0)
Immature Granulocytes: 1 %
Lymphocytes Relative: 5 %
Lymphs Abs: 1 10*3/uL (ref 0.7–4.0)
MCH: 30.8 pg (ref 26.0–34.0)
MCHC: 34 g/dL (ref 30.0–36.0)
MCV: 90.5 fL (ref 80.0–100.0)
Monocytes Absolute: 1.1 10*3/uL — ABNORMAL HIGH (ref 0.1–1.0)
Monocytes Relative: 6 %
Neutro Abs: 16.2 10*3/uL — ABNORMAL HIGH (ref 1.7–7.7)
Neutrophils Relative %: 88 %
Platelets: 204 10*3/uL (ref 150–400)
RBC: 4.97 MIL/uL (ref 4.22–5.81)
RDW: 12.9 % (ref 11.5–15.5)
WBC: 18.4 10*3/uL — ABNORMAL HIGH (ref 4.0–10.5)
nRBC: 0 % (ref 0.0–0.2)

## 2020-09-01 LAB — TROPONIN I (HIGH SENSITIVITY)
Troponin I (High Sensitivity): 586 ng/L (ref <18)
Troponin I (High Sensitivity): 569 ng/L (ref <18)

## 2020-09-01 LAB — HEPATIC FUNCTION PANEL
ALT: 614 U/L — ABNORMAL HIGH (ref 0–44)
AST: 477 U/L — ABNORMAL HIGH (ref 15–41)
Albumin: 3.6 g/dL (ref 3.5–5.0)
Alkaline Phosphatase: 72 U/L (ref 38–126)
Bilirubin, Direct: 0.4 mg/dL — ABNORMAL HIGH (ref 0.0–0.2)
Indirect Bilirubin: 1.3 mg/dL — ABNORMAL HIGH (ref 0.3–0.9)
Total Bilirubin: 1.7 mg/dL — ABNORMAL HIGH (ref 0.3–1.2)
Total Protein: 6.8 g/dL (ref 6.5–8.1)

## 2020-09-01 LAB — RAPID URINE DRUG SCREEN, HOSP PERFORMED
Amphetamines: NOT DETECTED
Barbiturates: NOT DETECTED
Benzodiazepines: NOT DETECTED
Cocaine: NOT DETECTED
Opiates: NOT DETECTED
Tetrahydrocannabinol: NOT DETECTED

## 2020-09-01 LAB — HEMOGLOBIN A1C
Hgb A1c MFr Bld: 6 % — ABNORMAL HIGH (ref 4.8–5.6)
Mean Plasma Glucose: 125.5 mg/dL

## 2020-09-01 LAB — HEPATITIS PANEL, ACUTE
HCV Ab: NONREACTIVE
Hep A IgM: NONREACTIVE
Hep B C IgM: NONREACTIVE
Hepatitis B Surface Ag: NONREACTIVE

## 2020-09-01 LAB — AMMONIA: Ammonia: 37 umol/L — ABNORMAL HIGH (ref 9–35)

## 2020-09-01 LAB — ECHOCARDIOGRAM COMPLETE
Area-P 1/2: 3.27 cm2
S' Lateral: 3 cm
Single Plane A4C EF: 49.3 %

## 2020-09-01 LAB — PROTIME-INR
INR: 1.2 (ref 0.8–1.2)
Prothrombin Time: 14.5 seconds (ref 11.4–15.2)

## 2020-09-01 LAB — LIPID PANEL
Cholesterol: 143 mg/dL (ref 0–200)
HDL: 46 mg/dL (ref >40)
LDL Cholesterol: 77 mg/dL (ref 0–99)
Total CHOL/HDL Ratio: 3.1 RATIO
Triglycerides: 102 mg/dL (ref <150)
VLDL: 20 mg/dL (ref 0–40)

## 2020-09-01 LAB — ACETAMINOPHEN LEVEL: Acetaminophen (Tylenol), Serum: <10 ug/mL — ABNORMAL LOW (ref 10–30)

## 2020-09-01 LAB — VALPROIC ACID LEVEL: Valproic Acid Lvl: 13 ug/mL — ABNORMAL LOW (ref 50.0–100.0)

## 2020-09-01 LAB — HIV ANTIBODY (ROUTINE TESTING W REFLEX): HIV Screen 4th Generation wRfx: NONREACTIVE

## 2020-09-01 LAB — CK: Total CK: 4614 U/L — ABNORMAL HIGH (ref 49–397)

## 2020-09-01 MED ORDER — ASPIRIN 300 MG RE SUPP
300.0000 mg | Freq: Every day | RECTAL | Status: DC
  Administered 2020-09-01: 300 mg via RECTAL
  Filled 2020-09-01: qty 1

## 2020-09-01 MED ORDER — ACETAMINOPHEN 650 MG RE SUPP: 650.0000 mg | RECTAL | Status: DC | PRN

## 2020-09-01 MED ORDER — ASPIRIN 325 MG PO TABS
325.0000 mg | ORAL_TABLET | Freq: Every day | ORAL | Status: DC
  Administered 2020-09-02 – 2020-09-04 (×3): 325 mg via ORAL
  Filled 2020-09-01 (×4): qty 1

## 2020-09-01 MED ORDER — SODIUM CHLORIDE 0.9 % IV SOLN: INTRAVENOUS | Status: DC

## 2020-09-01 MED ORDER — NICOTINE 14 MG/24HR TD PT24
14.0000 mg | MEDICATED_PATCH | Freq: Every day | TRANSDERMAL | Status: DC
  Administered 2020-09-01 – 2020-09-07 (×7): 14 mg via TRANSDERMAL
  Filled 2020-09-01 (×7): qty 1

## 2020-09-01 MED ORDER — ENOXAPARIN SODIUM 40 MG/0.4ML ~~LOC~~ SOLN
40.0000 mg | SUBCUTANEOUS | Status: DC
  Administered 2020-09-01 – 2020-09-02 (×2): 40 mg via SUBCUTANEOUS
  Filled 2020-09-01 (×2): qty 0.4

## 2020-09-01 MED ORDER — HYDRALAZINE HCL 20 MG/ML IJ SOLN: 10.0000 mg | INTRAMUSCULAR | Status: DC | PRN

## 2020-09-01 MED ORDER — STROKE: EARLY STAGES OF RECOVERY BOOK
Freq: Once | Status: AC
  Administered 2020-09-01: 1
  Filled 2020-09-01: qty 1

## 2020-09-01 MED ORDER — DEXTROSE 5 % IV SOLN: INTRAVENOUS | Status: AC

## 2020-09-01 MED ORDER — ACETAMINOPHEN 160 MG/5ML PO SOLN: 650.0000 mg | ORAL | Status: DC | PRN

## 2020-09-01 MED ORDER — TETANUS-DIPHTH-ACELL PERTUSSIS 5-2.5-18.5 LF-MCG/0.5 IM SUSY
0.5000 mL | PREFILLED_SYRINGE | Freq: Once | INTRAMUSCULAR | Status: AC
  Administered 2020-09-01: 0.5 mL via INTRAMUSCULAR
  Filled 2020-09-01: qty 0.5

## 2020-09-01 MED ORDER — ACETAMINOPHEN 325 MG PO TABS
650.0000 mg | ORAL_TABLET | ORAL | Status: DC | PRN
  Administered 2020-09-04 – 2020-09-07 (×2): 650 mg via ORAL
  Filled 2020-09-01 (×2): qty 2

## 2020-09-01 NOTE — Consult Note (Addendum)
Cardiology Consultation:   Patient ID: Bryan Davies MRN: [id number]; DOB: 08-Dec-1950  Admit date: 08/31/2020 Date of Consult: 09/01/2020  Primary Care Provider: [facility] Cardiologist: New to [facility] Electrophysiologist:  None    Patient Profile:   Bryan Davies is a 69 y.o. male with a PMH of HTN, HLD, CVA 12/2018, carotid artery disease s/p carotid artery stenting, bipolar disorder, tobacco abuse, who is being seen today for the evaluation of elevated troponin at the request of Dr. Robb Matar.  History of Present Illness:   Bryan Davies was in his usual state of health until 08/28/20 when he complained of stroke-like symptoms, though did not present to the ED despite his fiancees encouragement. She was not able to see him until 08/31/20 where she found him face down on the floor. EMS was activated and he had slurred speech, and left arm/leg weakness. It was suspected that he was down >24 hours based on level of dehydration and elevated CK. CTA head/neck  Did not show any acute findings but showed chronic right frontal lobe infarct in the MCA distribution. Initial blood work showed Cr 1.5 (baseline 1.0), CK 4600, WBC 19.4. Utox was negative. HsTrop trend [phone]. Lipid panel with Tcholesterol 143, LDL 77. A1C 6.0. EKG showed sinus rhythm, rate 98 bpm, TWI in aVL and V2, non-specific ST-T wave abnormalities (likely repol abnormalities). Echo is pending. Cardiology asked to evaluate for elevated troponins.  He has not been evaluated by cardiology in the past. No prior CAD or CHF history. No prior ischemic evaluation to review in epic. His last echocardiogram 12/2018 in the setting of a CVA showed EF >65%, indeterminate LV diastolic function, no RWMA, mildly reduced RV systolic function, and bicuspid aortic valve with mild stenosis.He reports a longstanding history of tobacco use - started at age 37, smoked 5 ppd at his peak, though has drastically cut back  and is currently smoking 3 cigarettes a day. He denies ETOH use or illicit drug use.   At the time of this evaluation he is sitting in the bedside chair shivering. Gave him a warm blanket which helped. He is primarily interested in eating his dinner. He does not recall specifics about how he ended up on the ground though denies any chest pain. He reports being too weak to get up. He is relatively inactive at baseline though does walk to [employer] on occasion. No complaints of exertional chest pain, SOB, or DOE. He denies recent dizziness, lightheadedness, syncope, palpitations, orthopnea, PND, or LE edema.   Past Medical History:  Diagnosis Date  . Anxiety   . Hypertension   . Shingles 2013  . Shoulder pain     Past Surgical History:  Procedure Laterality Date  . IR ANGIO VERTEBRAL SEL SUBCLAVIAN INNOMINATE UNI R MOD SED  01/16/2019  . IR CT HEAD LTD  01/16/2019  . IR INTRAVSC STENT CERV CAROTID W/O EMB-PROT MOD SED INC ANGIO  01/16/2019  . IR PERCUTANEOUS ART THROMBECTOMY/INFUSION INTRACRANIAL INC DIAG ANGIO  01/16/2019  . RADIOLOGY WITH ANESTHESIA N/A 01/15/2019   Procedure: IR WITH ANESTHESIA;  Surgeon: Julieanne Cotton, MD;  Location: [facility] OR;  Service: Radiology;  Laterality: N/A;     Home Medications:  Prior to Admission medications   Medication Sig Start Date End Date Taking? Authorizing Provider  aspirin 81 MG chewable tablet Chew 1 tablet (81 mg total) by mouth daily. 01/18/19  Yes Rinehuls, Kinnie Scales, PA-C  Deutetrabenazine (AUSTEDO) 12 MG TABS Take 12  mg by mouth in the morning and at bedtime.   Yes [provider]  divalproex (DEPAKOTE ER) 250 MG 24 hr tablet TAKE THREE TABLETS (750 MG TOTAL) BY MOUTH AT BEDTIME. Patient taking differently: Take 750 mg by mouth at bedtime.  08/10/20  Yes Toy Cookey E, NP  gabapentin (NEURONTIN) 300 MG capsule Take 1 capsule (300 mg total) by mouth 2 (two) times daily. 06/08/20  Yes Toy Cookey E, NP  losartan (COZAAR) 50 MG  tablet Take 1 tablet (50 mg total) by mouth daily. 06/01/19  Yes Malvin Johns, MD  tamsulosin (FLOMAX) 0.4 MG CAPS capsule Take 0.4 mg by mouth daily. 03/27/19  Yes [provider]  ticagrelor (BRILINTA) 90 MG TABS tablet Take 1 tablet (90 mg total) by mouth 2 (two) times daily. 01/18/19  Yes Rinehuls, Kinnie Scales, PA-C  amLODipine (NORVASC) 5 MG tablet Take 1 tablet (5 mg total) by mouth daily. Patient not taking: Reported on 09/01/2020 07/25/16   Shon Hale, MD  atorvastatin (LIPITOR) 40 MG tablet Take 1 tablet (40 mg total) by mouth daily. Patient not taking: Reported on 09/01/2020 01/18/19   Rinehuls, Kinnie Scales, PA-C  benztropine (COGENTIN) 0.5 MG tablet Take 1 tablet (0.5 mg total) by mouth 2 (two) times daily. Patient not taking: Reported on 08/31/2020 06/01/19   Malvin Johns, MD  Deutetrabenazine (AUSTEDO) 9 MG TABS Take 18 mg by mouth 2 (two) times daily. Patient not taking: Reported on 09/01/2020 08/24/20   Toy Cookey E, NP  nicotine (NICODERM CQ - DOSED IN MG/24 HOURS) 14 mg/24hr patch Place 1 patch (14 mg total) onto the skin daily. Patient not taking: Reported on 09/01/2020 06/08/20   Shanna Cisco, NP    Inpatient Medications: Scheduled Meds: .  stroke: mapping our early stages of recovery book   Does not apply Once  . aspirin  300 mg Rectal Daily   Or  . aspirin  325 mg Oral Daily  . enoxaparin (LOVENOX) injection  40 mg Subcutaneous Q24H  . nicotine  14 mg Transdermal Daily   Continuous Infusions: . dextrose 125 mL/hr at 09/01/20 0759   PRN Meds: acetaminophen **OR** acetaminophen (TYLENOL) oral liquid 160 mg/5 mL **OR** acetaminophen, hydrALAZINE  Allergies:    Allergies  Allergen Reactions  . Pollen Extract Other (See Comments)    Sneezing     Social History:   Social History   Socioeconomic History  . Marital status: Single    Spouse name: Not on file  . Number of children: Not on file  . Years of education: Not on file  . Highest education  level: Not on file  Occupational History  . Occupation: Carpenter  Tobacco Use  . Smoking status: Current Every Day Smoker    Packs/day: 2.00    Years: 50.00    Pack years: 100.00    Types: Cigarettes  . Smokeless tobacco: Never Used  . Tobacco comment: 10  cigarettes per day, waits an hour between  Vaping Use  . Vaping Use: Never used  Substance and Sexual Activity  . Alcohol use: No  . Drug use: No  . Sexual activity: Not on file  Other Topics Concern  . Not on file  Social History Narrative  . Not on file   Social Determinants of Health   Financial Resource Strain:   . Difficulty of Paying Living Expenses: Not on file  Food Insecurity:   . Worried About Programme researcher, broadcasting/film/video in the Last Year: Not on file  .  Ran Out of Food in the Last Year: Not on file  Transportation Needs:   . Lack of Transportation (Medical): Not on file  . Lack of Transportation (Non-Medical): Not on file  Physical Activity:   . Days of Exercise per Week: Not on file  . Minutes of Exercise per Session: Not on file  Stress:   . Feeling of Stress : Not on file  Social Connections:   . Frequency of Communication with Friends and Family: Not on file  . Frequency of Social Gatherings with Friends and Family: Not on file  . Attends Religious Services: Not on file  . Active Member of Clubs or Organizations: Not on file  . Attends Banker Meetings: Not on file  . Marital Status: Not on file  Intimate Partner Violence:   . Fear of Current or Ex-Partner: Not on file  . Emotionally Abused: Not on file  . Physically Abused: Not on file  . Sexually Abused: Not on file    Family History:    Family History  Family history unknown: Yes     ROS:  Please see the history of present illness.   All other ROS reviewed and negative.     Physical Exam/Data:   Vitals:   09/01/20 0845 09/01/20 0900 09/01/20 0930 09/01/20 1019  BP: (!) 92/59 (!) 96/58 102/67 102/64  Pulse: 97 95 95 92  Resp:  (!) 23 18 (!) 21 16  Temp:    98.9 F (37.2 C)  TempSrc:    Oral  SpO2: 100% 100% 100% 96%    Intake/Output Summary (Last 24 hours) at 09/01/2020 1055 Last data filed at 08/31/2020 2341 Gross per 24 hour  Intake 1000 ml  Output --  Net 1000 ml   Last 3 Weights 08/10/2020 05/18/2020 05/18/2020  Weight (lbs) 148 lb 149 lb 14.6 oz 150 lb  Weight (kg) 67.132 kg 68 kg 68.04 kg  Some encounter information is confidential and restricted. Go to Review Flowsheets activity to see all data.     There is no height or weight on file to calculate BMI.  General:  Unkempt gentleman sitting in bedside chair shivering, otherwise NAD HEENT: sclera anicteric Neck: no JVD Vascular: No carotid bruits; FA pulses 2+ bilaterally without bruits  Cardiac:  normal S1, S2; RRR; no murmurs, rubs, or gallops Lungs:  clear to auscultation bilaterally, no wheezing, rhonchi or rales  Abd: soft, nontender, no hepatomegaly  Ext: no edema Musculoskeletal:  No deformities, BUE and BLE strength normal and equal Skin: warm and dry  Neuro:  CNs 2-12 intact, no focal abnormalities noted Psych:  Normal affect   EKG:  The EKG was personally reviewed and demonstrates:   sinus rhythm, rate 98 bpm, TWI in aVL and V2, non-specific ST-T wave abnormalities (likely repol abnormalities) - no significant change from previous Telemetry:  Telemetry was personally reviewed and demonstrates:  Sinus rhythm/sinus tachycardia  Relevant CV Studies: Echocardiogram 12/2018: 1. The left ventricle has hyperdynamic systolic function, with an  ejection fraction of >65%. The cavity size was normal. There is mildly  increased left ventricular wall thickness. Left ventricular diastolic  Doppler parameters are indeterminate. No  evidence of left ventricular regional wall motion abnormalities.  2. The right ventricle has mildly reduced systolic function. The cavity  was mildly enlarged. There is no increase in right ventricular wall  thickness.  Right ventricular systolic pressure could not be assessed.  3. The aortic valve is abnormal, possibly functionally bicuspid with  fusion  of the left and right coronary cusps. Moderate calcification of the  aortic valve. Aortic valve regurgitation is trivial by color flow Doppler.  There is at least mild stenosis of  the aortic valve based on limited interrogation. Mild aortic annular  calcification noted.  4. The mitral valve is grossly normal.  5. The tricuspid valve is grossly normal.  6. The aortic root is normal in size and structure.   Echocardiogram 09/01/20: 1. Left ventricular ejection fraction, by estimation, is 65 to 70%. The  left ventricle has normal function. The left ventricle has no regional  wall motion abnormalities. Left ventricular diastolic parameters are  consistent with Grade I diastolic  dysfunction (impaired relaxation).  2. Right ventricular systolic function is normal. The right ventricular  size is normal. There is normal pulmonary artery systolic pressure.  3. The mitral valve is normal in structure. No evidence of mitral valve  regurgitation. No evidence of mitral stenosis.  4. The aortic valve is normal in structure. Aortic valve regurgitation is  not visualized. Mild to moderate aortic valve sclerosis/calcification is  present, without any evidence of aortic stenosis.  5. The inferior vena cava is normal in size with greater than 50%  respiratory variability, suggesting right atrial pressure of 3 mmHg.    Laboratory Data:  High Sensitivity Troponin:   Recent Labs  Lab 08/11/20 0100 08/31/20 2034 08/31/20 2238 09/01/20 0203 09/01/20 0412  TROPONINIHS 4 421* 407* 586* 569*     Chemistry Recent Labs  Lab 08/31/20 2034 08/31/20 2247 09/01/20 0203  NA 146* 146* 149*  K 4.8 4.2 3.9  CL 110 111 111  CO2 19*  --  26  GLUCOSE 183* 148* 113*  BUN 80* 78* 76*  CREATININE 1.50* 1.30* 1.40*  CALCIUM 9.4  --  9.3  GFRNONAA 51*  --  55*   ANIONGAP 17*  --  12    Recent Labs  Lab 08/31/20 2034 09/01/20 0203  PROT 7.2 6.8  ALBUMIN 3.7 3.6  AST 388* 477*  ALT 471* 614*  ALKPHOS 73 72  BILITOT 1.8* 1.7*   Hematology Recent Labs  Lab 08/31/20 2034 08/31/20 2247 09/01/20 0203  WBC 19.4*  --  18.4*  RBC 5.34  --  4.97  HGB 16.5 12.9* 15.3  HCT 48.8 38.0* 45.0  MCV 91.4  --  90.5  MCH 30.9  --  30.8  MCHC 33.8  --  34.0  RDW 12.9  --  12.9  PLT 212  --  204   BNPNo results for input(s): BNP, PROBNP in the last 168 hours.  DDimer No results for input(s): DDIMER in the last 168 hours.   Radiology/Studies:  CT Angio Head W or Wo Contrast  Result Date: 08/31/2020 CLINICAL DATA:  Initial evaluation for possible stroke, left-sided deficits. EXAM: CT ANGIOGRAPHY HEAD AND NECK TECHNIQUE: Multidetector CT imaging of the head and neck was performed using the standard protocol during bolus administration of intravenous contrast. Multiplanar CT image reconstructions and MIPs were obtained to evaluate the vascular anatomy. Carotid stenosis measurements (when applicable) are obtained utilizing NASCET criteria, using the distal internal carotid diameter as the denominator. CONTRAST:  OMNIPAQUE IOHEXOL 350 MG/ML SOLN COMPARISON:  Prior study from 01/15/2019. FINDINGS: CT HEAD FINDINGS Brain: Generalized age-related cerebral atrophy with mild chronic small vessel ischemic disease. Encephalomalacia involving the left frontal lobe compatible with chronic left MCA territory infarct. No acute intracranial hemorrhage. No acute large vessel territory infarct. No mass lesion, midline shift or mass effect.  No hydrocephalus or extra-axial fluid collection. Vascular: No hyperdense vessel. Scattered vascular calcifications noted within the carotid siphons. Skull: Scalp soft tissues and calvarium within normal limits. Sinuses: Paranasal sinuses are largely clear. No significant mastoid effusion. Orbits: Globes and orbital soft tissues within  normal limits. Review of the MIP images confirms the above findings CTA NECK FINDINGS Aortic arch: Visualized aortic arch of normal caliber with normal 3 vessel morphology. Moderate atheromatous change about the arch and origins of the great vessels without high-grade stenosis. Right carotid system: Right common carotid artery widely patent proximally. Eccentric calcified plaque at the distal right CCA with no more than mild stenosis. Vascular stent in place across the right carotid bifurcation. Small focus of intraluminal soft plaque/thrombus within the stent itself with short-segment 40-50% stenosis (series 8, image 165). Flow within the stent otherwise widely patent. Right ICA widely patent distally without stenosis, dissection or occlusion. Left carotid system: Calcified plaque at the origin of the left CCA with associated stenosis of up to 50% by NASCET criteria. Left CCA otherwise patent distally to the bifurcation without significant stenosis. Mild scattered atheromatous plaque about the left bifurcation without flow-limiting stenosis. Left ICA patent distally to the skull base without significant stenosis, dissection or occlusion. Vertebral arteries: Both vertebral arteries arise from the subclavian arteries. Right vertebral artery dominant, with a diffusely hypoplastic left vertebral artery. Atheromatous change at the origin of the diminutive left vertebral artery with focal severe ostial stenosis (series 8, image 77). Additional scattered mild nonstenotic plaque within the vertebral arteries bilaterally without additional high-grade stenosis, dissection or occlusion. Skeleton: Better evaluated on concomitant CT of the cervical spine. Multilevel cervical spondylosis noted. No worrisome osseous lesions. Other neck: No other acute soft tissue abnormality within the neck. Upper chest: Visualized upper chest demonstrates no acute finding. Centrilobular and paraseptal emphysematous changes noted. Review of the  MIP images confirms the above findings CTA HEAD FINDINGS Anterior circulation: Petrous segments patent bilaterally. Scattered atheromatous plaque within the carotid siphons without hemodynamically significant stenosis. A1 segments widely patent. Normal anterior communicating artery complex. Anterior cerebral arteries patent to their distal aspects without stenosis. No M1 stenosis or occlusion. Normal MCA bifurcations. Distal MCA branches well perfused and symmetric. Posterior circulation: Both vertebral arteries patent to the vertebrobasilar junction without stenosis. Right PICA patent. Left PICA not seen. Basilar patent to its distal aspect without stenosis. Superior cerebellar and posterior cerebral arteries patent bilaterally. Venous sinuses: Grossly patent allowing for timing the contrast bolus. Anatomic variants: None significant.  No aneurysm. Review of the MIP images confirms the above findings IMPRESSION: CT HEAD IMPRESSION: 1. No acute intracranial abnormality. 2. Chronic right frontal lobe infarct, right MCA distribution. 3. Underlying age-related cerebral atrophy with mild chronic small vessel ischemic disease. CTA HEAD AND NECK IMPRESSION: 1. Negative CTA for large vessel occlusion. 2. Short-segment 50% stenosis at the origin of the left common carotid artery. 3. Vascular stent in place across the right carotid bifurcation. Small focus of intraluminal soft plaque/thrombus with associated short-segment stenosis of up to approximately 40-50% by NASCET criteria. Flow within the stent otherwise widely patent. 4. 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 Right vertebral artery dominant. 5. Additional scattered atherosclerotic change elsewhere about the major arterial vasculature of the head and neck as above. No other hemodynamically significant or correctable stenosis. 6. Emphysema (ICD10-J43.9). Electronically Signed   By: Rise Mu M.D.   On: 08/31/2020 23:55    DG Shoulder Right  Result Date: 09/01/2020 CLINICAL  DATA:  Unable to move right shoulder. EXAM: RIGHT SHOULDER - 2+ VIEW COMPARISON:  None. FINDINGS: There is no evidence of fracture or dislocation. Moderate degenerative changes seen involving the right acromioclavicular joint. Soft tissues are unremarkable. 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 No acute abnormality seen in the right shoulder. Electronically Signed   By: Lupita Raider M.D.   On: 09/01/2020 09:20   CT Angio Neck W and/or Wo Contrast  Result Date: 08/31/2020 CLINICAL DATA:  Initial evaluation for possible stroke, left-sided deficits. EXAM: CT ANGIOGRAPHY HEAD AND NECK TECHNIQUE: Multidetector CT imaging of the head and neck was performed using the standard protocol during bolus administration of intravenous contrast. Multiplanar CT image reconstructions and MIPs were obtained to evaluate the vascular anatomy. Carotid stenosis measurements (when applicable) are obtained utilizing NASCET criteria, using the distal internal carotid diameter as the denominator. CONTRAST:  OMNIPAQUE IOHEXOL 350 MG/ML SOLN COMPARISON:  Prior study from 01/15/2019. FINDINGS: CT HEAD FINDINGS Brain: Generalized age-related cerebral atrophy with mild chronic small vessel ischemic disease. Encephalomalacia involving the left frontal lobe compatible with chronic left MCA territory infarct. No acute intracranial hemorrhage. No acute large vessel territory infarct. No mass lesion, midline shift or mass effect. No hydrocephalus or extra-axial fluid collection. Vascular: No hyperdense vessel. Scattered vascular calcifications noted within the carotid siphons. Skull: Scalp soft tissues and calvarium within normal limits. Sinuses: Paranasal sinuses are largely clear. No significant mastoid effusion. Orbits: Globes and orbital soft tissues within normal limits. Review of the MIP images confirms the above findings CTA  NECK FINDINGS Aortic arch: Visualized aortic arch of normal caliber with normal 3 vessel morphology. Moderate atheromatous change about the arch and origins of the great vessels without high-grade stenosis. Right carotid system: Right common carotid artery widely patent proximally. Eccentric calcified plaque at the distal right CCA with no more than mild stenosis. Vascular stent in place across the right carotid bifurcation. Small focus of intraluminal soft plaque/thrombus within the stent itself with short-segment 40-50% stenosis (series 8, image 165). Flow within the stent otherwise widely patent. Right ICA widely patent distally without stenosis, dissection or occlusion. Left carotid system: Calcified plaque at the origin of the left CCA with associated stenosis of up to 50% by NASCET criteria. Left CCA otherwise patent distally to the bifurcation without significant stenosis. Mild scattered atheromatous plaque about the left bifurcation without flow-limiting stenosis. Left ICA patent distally to the skull base without significant stenosis, dissection or occlusion. Vertebral arteries: Both vertebral arteries arise from the subclavian arteries. Right vertebral artery dominant, with a diffusely hypoplastic left vertebral artery. Atheromatous change at the origin of the diminutive left vertebral artery with focal severe ostial stenosis (series 8, image 77). Additional scattered mild nonstenotic plaque within the vertebral arteries bilaterally without additional high-grade stenosis, dissection or occlusion. Skeleton: Better evaluated on concomitant CT of the cervical spine. Multilevel cervical spondylosis noted. No worrisome osseous lesions. Other neck: No other acute soft tissue abnormality within the neck. Upper chest: Visualized upper chest demonstrates no acute finding. Centrilobular and paraseptal emphysematous changes noted. Review of the MIP images confirms the above findings CTA HEAD FINDINGS Anterior  circulation: Petrous segments patent bilaterally. Scattered atheromatous plaque within the carotid siphons without hemodynamically significant stenosis. A1 segments widely patent. Normal anterior communicating artery complex. Anterior cerebral arteries patent to their distal aspects without stenosis. No M1 stenosis or occlusion. Normal MCA bifurcations. Distal MCA branches well perfused and symmetric. Posterior circulation: Both vertebral arteries patent to  the vertebrobasilar junction without stenosis. Right PICA patent. Left PICA not seen. Basilar patent to its distal aspect without stenosis. Superior cerebellar and posterior cerebral arteries patent bilaterally. Venous sinuses: Grossly patent allowing for timing the contrast bolus. Anatomic variants: None significant.  No aneurysm. Review of the MIP images confirms the above findings IMPRESSION: CT HEAD IMPRESSION: 1. No acute intracranial abnormality. 2. Chronic right frontal lobe infarct, right MCA distribution. 3. Underlying age-related cerebral atrophy with mild chronic small vessel ischemic disease. CTA HEAD AND NECK IMPRESSION: 1. Negative CTA for large vessel occlusion. 2. Short-segment 50% stenosis at the origin of the left common carotid artery. 3. Vascular stent in place across the right carotid bifurcation. Small focus of intraluminal soft plaque/thrombus with associated short-segment stenosis of up to approximately 40-50% by NASCET criteria. Flow within the stent otherwise widely patent. 4. 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 Right vertebral artery dominant. 5. Additional scattered atherosclerotic change elsewhere about the major arterial vasculature of the head and neck as above. No other hemodynamically significant or correctable stenosis. 6. Emphysema (ICD10-J43.9). Electronically Signed   By: Rise Mu M.D.   On: 08/31/2020 23:55   CT Cervical Spine Wo Contrast  Result Date:  08/31/2020 CLINICAL DATA:  Found down, weakness EXAM: CT CERVICAL SPINE WITHOUT CONTRAST TECHNIQUE: Multidetector CT imaging of the cervical spine was performed without intravenous contrast. Multiplanar CT image reconstructions were also generated. COMPARISON:  None. FINDINGS: Alignment: Alignment is anatomic. Skull base and vertebrae: No acute displaced fracture. Soft tissues and spinal canal: No prevertebral fluid or swelling. No visible canal hematoma. Stent is identified within the right carotid artery. Disc levels: Multilevel cervical spondylosis greatest at C4-5, C5-6, and C6-7. Symmetrical neural foraminal encroachment is seen at these levels. Upper chest: Airways patent. Emphysematous changes are seen at the lung apices. Other: Reconstructed images demonstrate no additional findings. IMPRESSION: 1. Lower cervical spondylosis.  No acute fracture. Electronically Signed   By: Sharlet [place] M.D.   On: 08/31/2020 22:51   MR BRAIN WO CONTRAST  Result Date: 09/01/2020 CLINICAL DATA:  Initial evaluation for neuro deficit, stroke suspected. Left-sided weakness. EXAM: MRI HEAD WITHOUT CONTRAST TECHNIQUE: Multiplanar, multiecho pulse sequences of the brain and surrounding structures were obtained without intravenous contrast. COMPARISON:  Prior CTA from 08/31/2020. FINDINGS: Brain: Diffuse prominence of the CSF containing spaces compatible generalized age-related cerebral atrophy. Scattered patchy T2/FLAIR hyperintensity within the periventricular white matter most consistent with chronic small vessel ischemic disease, mild for age. Encephalomalacia and gliosis involving the right frontoparietal region consistent with a chronic right MCA territory infarct. Associated scattered chronic hemosiderin staining present within this region as well. Additional small remote lacunar infarct present at the right thalamus. No abnormal foci of restricted diffusion to suggest acute or subacute ischemia. Gray-white matter  differentiation maintained. No other areas of remote cortical infarction. No evidence for acute intracranial hemorrhage. No mass lesion, midline shift or mass effect. No hydrocephalus or extra-axial fluid collection. Pituitary gland and suprasellar region within normal limits. Midline structures intact. Vascular: Major intracranial vascular flow voids are maintained. Skull and upper cervical spine: Craniocervical junction within normal limits. Bone marrow signal intensity normal. No scalp soft tissue abnormality. Sinuses/Orbits: Globes and orbital soft tissues within normal limits. Paranasal sinuses are largely clear. No significant mastoid effusion. Inner ear structures grossly normal. Other: None. IMPRESSION: 1. No acute intracranial infarct or other abnormality. 2. Chronic right MCA territory infarct, with additional remote lacunar infarct involving the right thalamus. 3.  Underlying age-related cerebral atrophy with mild chronic small vessel ischemic disease. Electronically Signed   By: Rise MuBenjamin  McClintock M.D.   On: 09/01/2020 03:49   DG Chest Portable 1 View  Result Date: 08/31/2020 CLINICAL DATA:  Altered mental status. Left-sided weakness and slurred speech. EXAM: PORTABLE CHEST 1 VIEW COMPARISON:  Radiograph 08/10/2020 FINDINGS: The cardiomediastinal contours are normal. Atherosclerosis of the thoracic aorta. Improved bronchial thickening from prior exam. Chronic biapical pleuroparenchymal scarring. Pulmonary vasculature is normal. No consolidation, pleural effusion, or pneumothorax. No acute osseous abnormalities are seen. IMPRESSION: No acute chest findings. Electronically Signed   By: Narda RutherfordMelanie  Sanford M.D.   On: 08/31/2020 21:08   DG Shoulder Left  Result Date: 09/01/2020 CLINICAL DATA:  Stroke symptoms. EXAM: LEFT SHOULDER - 2+ VIEW COMPARISON:  Prior study same day. FINDINGS: Acromioclavicular and glenohumeral degenerative change. No acute bony abnormality identified. No evidence of fracture or  dislocation. High-riding left shoulder consistent with rotator cuff tear. IMPRESSION: 1. Acromioclavicular and glenohumeral degenerative change. No acute bony abnormality. 2. High-riding left shoulder consistent with rotator cuff tear. Electronically Signed   By: Maisie Fushomas  Register   On: 09/01/2020 09:20   CT RENAL STONE STUDY  Result Date: 09/01/2020 CLINICAL DATA:  Flank pain. EXAM: CT ABDOMEN AND PELVIS WITHOUT CONTRAST TECHNIQUE: Multidetector CT imaging of the abdomen and pelvis was performed following the standard protocol without IV contrast. COMPARISON:  Abdomen and pelvis radiographs dated 08/15/2009. FINDINGS: Lower chest: Unremarkable. Hepatobiliary: 6 mm probable noncalcified gallstone in the gallbladder. No gallbladder wall thickening or pericholecystic fluid. Unremarkable liver. Pancreas: Unremarkable. No pancreatic ductal dilatation or surrounding inflammatory changes. Spleen: Normal in size without focal abnormality. Adrenals/Urinary Tract: Normal appearing adrenal glands. Horseshoe kidney. 1.0 cm rounded low density mass in the anterior aspect of the kidney on the left. This measures 51 Hounsfield units in density on image number 36 series 3. No urinary tract calculi or hydronephrosis. Stomach/Bowel: Unremarkable stomach, small bowel and colon. No evidence of appendicitis. Vascular/Lymphatic: Atheromatous arterial calcifications without aneurysm. No enlarged lymph nodes. Reproductive: Mildly enlarged prostate gland. Other: No abdominal wall hernia or abnormality. No abdominopelvic ascites. Musculoskeletal: Lumbar and lower thoracic spine degenerative changes. IMPRESSION: 1. No acute abnormality. 2. Horseshoe kidney. 3. 1.0 cm rounded low density mass in the anterior aspect of the kidney on the left. This could represent a complicated cyst or solid mass. Further evaluation with pre and post contrast MRI should be considered. Pre and post contrast CT could alternatively be performed, but would  likely be of decreased accuracy given lesion size. 4. 6 mm probable noncalcified gallstone in the gallbladder. 5. Mildly enlarged prostate gland. Electronically Signed   By: Beckie SaltsSteven  Reid M.D.   On: 09/01/2020 08:32     Assessment and Plan:   1. Elevated troponin: patient presented after being found down, presumably for >24 hours, with associated left sided weakness. CK was elevated to 4000s and he had a mild AKI (Cr 1.5 on admission; baseline 1.0). HsTrop elevated to 580s and trended down. EKG shows non-specific ST-T wave abnormalities likely 2/2 early repol; overall unchanged from previous. No complaints of chest pain, SOB, or DOE. He has no known CAD history, though has never had an ischemic evaluation. Echo with EF 65-70%, G1DD, no RWMA, and no significant valvular abnormalities. Risk factors for CAD include HTN, HLD, carotid artery disease, longstanding tobacco abuse, and pre-DM type 2. Suspect there is underlying CAD and the trop elevation represents demand ischemia in the setting of rhabdomyolysis.  - Would consider  an outpatient NST vs coronary CTA once recovered from rhabdomyolysis. - Continue aspirin and brilinta - Anticipate restarting statin once CK downtrends  2. HTN: home amlodipine and losartan on hold in the setting of possible CVA and hypotension - Continue to monitor and re-introduce medications as needed  3. HLD: LDL 77 this admission. Recommend goal LDL <70 in the setting of carotid artery disease and prior strokes - Consider restarting atorvastatin at 80mg  daily once CK downtrends  4. History of CVA: patient presented with left sided weakness and slurred speech. CTA head/neck did not reveal acute findings but showed chronic ischemia in the MCA distribution. MRI brain also without acute findings. Neurology is following.  - Continue aspirin and brilinta - Anticipate restarting statin once CK downtrends  5. Carotid artery disease s/p right ICA stent 12/2018: 40-50% stenosis noted  on CTA neck this admission with widely patent stent  - Continue aspirin and statin     For questions or updates, please contact [facility] Please consult [email or web address] for contact info under    Signed, 01/2019, PA-C  09/01/2020 10:55 AM  Personally seen and examined. Agree with APP above with the following comments: Briefly 69 yo M with prior stroke who was found down after a mechanical fall  (remembers the fall, noted getting caught on his pant legs).  Denies chest pain or SOB; though perseverates through exam:  Wants ice water and chocolate milk. Patient notes that he feels fine now, that he was found on the flood for several days, and that he just needs something to drink. Exam notable for systolic crescendo but without focal weakness presently Labs notable for CK level of 4600 with troponin 568 from peak < 600. Personally reviewed relevant tests; No LV WMA, aortic valve is calcified  Biggest concern presently is for demand ischemia secondary to his down time and rhabmdomyolysis.  He does have risk factors for CAD.  Presently will take more conserative mgmt unless anginal sx or equivalent.  79, MD

## 2020-09-01 NOTE — ED Notes (Signed)
MD Toniann Fail paged regarding pt's request for nicotine patch

## 2020-09-01 NOTE — Evaluation (Signed)
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   Ischemic stroke (HCC) 01/15/2019   Left shoulder pain 12/05/2015   Urinary hesitancy 12/05/2015   Pain in lower jaw 07/11/2015   Hyperlipidemia 02/11/2014   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   Dyshydrosis 07/26/2013   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   Hypertension 05/21/2012   Post herpetic neuralgia 05/21/2012   Bipolar disorder (HCC) 05/21/2012   Tobacco abuse 05/21/2012   Past Medical History:  Past Medical History:  Diagnosis Date   Anxiety    Hypertension    Shingles 2013   Shoulder pain    Past Surgical History:  Past Surgical History:  Procedure Laterality Date   IR ANGIO VERTEBRAL SEL SUBCLAVIAN INNOMINATE UNI R MOD SED  01/16/2019   IR CT HEAD LTD  01/16/2019   IR INTRAVSC STENT CERV CAROTID W/O EMB-PROT MOD SED INC ANGIO  01/16/2019   IR PERCUTANEOUS ART THROMBECTOMY/INFUSION INTRACRANIAL INC DIAG ANGIO  01/16/2019   RADIOLOGY WITH ANESTHESIA N/A 01/15/2019   Procedure: IR WITH ANESTHESIA;  Surgeon: Julieanne Cotton, MD;  Location: [facility] OR;  Service: Radiology;  Laterality: N/A;   HPI:  69 y.o. male with history of stroke and March 2020 status post carotid stent placement with history of hypertension, COPD, ongoing tobacco abuse and bipolar disorder was brought to the ER after patient was found to be on the floor confused and weak on the left side as noticed  by patient's girlfriend. MRI (-) for acute  CVA; chronic R MCA infarct with additional remote lacumar infarct incolving R thalamusl age  - related cerebral atrophy.  AKI in setting on rhabdomyolysis; elevated troponins; acute encephalopathy.   Assessment / Plan / Recommendation Clinical Impression  Patient presents with a moderate cognitive impairment with specific deficits in memory (retrieval, recall new information), delays in cognitive progressing with problem solving and reasoning, abnormal affect with monotone voice, limited eye contact and limited initiation/spontaneous speech production. Patient was oriented to self, situation, place but not time. He completed one basic level functional mathematical problem in head (adding money $3 +20) and was correct but with response delayed and without ability to perform similar types of questions. As patient is reportedly independent and lives alone at home, he will benefit from SLP therapy in acute setting to maximize safety, memory, reasoning, problem solving prior to discharge to next venue of care.    SLP Assessment  SLP Recommendation/Assessment: Patient needs continued Speech Lanaguage Pathology Services SLP Visit Diagnosis: Cognitive communication deficit (R41.841)    Follow Up Recommendations  Skilled Nursing facility;24 hour supervision/assistance;Home health SLP    Frequency and Duration min 2x/week  1 week      SLP Evaluation Cognition  Overall Cognitive Status: Impaired/Different from baseline Orientation Level: Oriented to situation;Oriented to person;Disoriented to time;Oriented to place Attention: Selective Selective Attention: Impaired Selective Attention Impairment: Verbal basic;Verbal complex;Functional basic Memory: Impaired Memory Impairment: Storage deficit;Decreased recall of new information Awareness: Impaired Awareness Impairment: Emergent impairment Executive Function: Initiating Initiating: Impaired Initiating  Impairment: Verbal  basic;Verbal complex Safety/Judgment: Impaired       Comprehension  Auditory Comprehension Overall Auditory Comprehension: Appears within functional limits for tasks assessed    Expression Expression Primary Mode of Expression: Verbal Verbal Expression Overall Verbal Expression: Impaired Initiation: Impaired Repetition: No impairment Naming: No impairment Pragmatics: Impairment Impairments: Eye contact;Abnormal affect;Topic maintenance;Monotone Interfering Components: Premorbid deficit Effective Techniques: Open ended questions;Semantic cues Non-Verbal Means of Communication: Not applicable Written Expression Dominant Hand: Right   Oral / Motor  Oral Motor/Sensory Function Overall Oral Motor/Sensory Function: Mild impairment Facial ROM: Within Functional Limits Facial Symmetry: Within Functional Limits Facial Strength: Within Functional Limits Facial Sensation: Within Functional Limits Lingual ROM: Reduced right;Reduced left Lingual Symmetry: Within Functional Limits Lingual Strength: Reduced Velum: Within Functional Limits Mandible: Within Functional Limits   GO                   Angela Nevin, MA, CCC-SLP Speech Therapy

## 2020-09-01 NOTE — Progress Notes (Signed)
[facility] PROGRESS NOTE    Progress Note  Bryan Davies  CWC:[id number] DOB: 05-10-1951 DOA: 08/31/2020 PCP: Gean Birchwood, [facility]     Brief Narrative:   Bryan Davies is an 69 y.o. male past medical history of stroke in 01/16/2019 status post carotid stenting, essential hypertension ongoing tobacco abuse bipolar disorder brought into the ED as he was found in the floor confused and weak on the left side by his girlfriend  Assessment/Plan:   Left-sided weakness: HgbA1c 6.0, fasting lipid panel HDL greater than 40 LDL less than 100 MRI, MRA of the brain without contrast MRI of the brain showed no acute infarcts, I did show chronic right-sided MCA infarct with additional remote lacunar infarcts to the right thalamus. PT, OT, pending speech consult failed swallowing evaluation stent across the right parotid bed for location with approximately 40 to 50% stenosis. CT angio of the head and neck showed short segment stenosis of the left common carotid Transthoracic Echo,   Start patient on ASA 81mg  daily and Brilinta Olding statins he has mild rhabdo. BP goal: permissive HTN upto 220/120 mmHg Telemetry monitoring. He is complaining of left shoulder pain, he cannot tell me how he fell I will get a x-ray of the left shoulder and right shoulder to evaluate for any fractures or dislocations. Question if his weakness is likely due to pain as he is complaining of a lot of left shoulder and left hip pain.  Acute kidney injury/in the setting of nontraumatic rhabdomyolysis: Baseline creatinine of around 1, on admission 1.4,we will start him on aggressive IV fluid hydration Increase his fluids to 125, he is becoming hypernatremic agree with D5W. Recheck a basic metabolic panel this afternoon and tomorrow morning. Specific gravity is greater than 1040 is likely prerenal azotemia in the setting of rhabdomyolysis.  Elevated troponins: Cardiology has been consulted. Patient failed swallow  evaluation use aspirin rectally. Twelve-lead EKG showed some ST segment elevation V2 through V4.  Elevated LFTs: Likely due to rhabdomyolysis. Hepatitis panel is negative HIV is nonreactive.  Acute encephalopathy: His ammonia level was elevated, has no history of liver disease. Depakote level is pending  History of bipolar disorder: There is a concern about an interaction between Depakote and his ammonia as it is elevated and he is currently on Depakote has no history of cirrhosis. Getting a Depakote level and repeating ammonia level.  History of essential hypertension: On IV hydralazine  Leukocytosis: Likely reactive has remained afebrile blood cultures have been sent.    DVT prophylaxis: lovenxo Family Communication:none Status is: Inpatient  Remains inpatient appropriate because:Hemodynamically unstable   Dispo: The patient is from: Home              Anticipated d/c is to: SNF              Anticipated d/c date is: > 3 days              Patient currently is not medically stable to d/c.        Code Status:     Code Status Orders  (From admission, onward)         Start     Ordered   09/01/20 0204  Full code  Continuous        09/01/20 0206        Code Status History    Date Active Date Inactive Code Status Order ID Comments User Context   05/27/2019 1621 06/01/2019 1600 Full Code 08/01/2019  Rankin, [id number]  B, NP Inpatient   05/26/2019 2356 05/27/2019 1537 Full Code [id number]  WardLayla Maw, DO ED   05/03/2019 0250 05/04/2019 2026 Full Code [id number]  Dione Booze, MD ED   01/15/2019 2115 01/18/2019 1754 Full Code [id number]  Aroor, Dara Lords, MD ED   Advance Care Planning Activity        IV Access:    Peripheral IV   Procedures and diagnostic studies:   CT Angio Head W or Wo Contrast  Result Date: 08/31/2020 CLINICAL DATA:  Initial evaluation for possible stroke, left-sided deficits. EXAM: CT ANGIOGRAPHY HEAD AND NECK TECHNIQUE: Multidetector CT  imaging of the head and neck was performed using the standard protocol during bolus administration of intravenous contrast. Multiplanar CT image reconstructions and MIPs were obtained to evaluate the vascular anatomy. Carotid stenosis measurements (when applicable) are obtained utilizing NASCET criteria, using the distal internal carotid diameter as the denominator. CONTRAST:  OMNIPAQUE IOHEXOL 350 MG/ML SOLN COMPARISON:  Prior study from 01/15/2019. FINDINGS: CT HEAD FINDINGS Brain: Generalized age-related cerebral atrophy with mild chronic small vessel ischemic disease. Encephalomalacia involving the left frontal lobe compatible with chronic left MCA territory infarct. No acute intracranial hemorrhage. No acute large vessel territory infarct. No mass lesion, midline shift or mass effect. No hydrocephalus or extra-axial fluid collection. Vascular: No hyperdense vessel. Scattered vascular calcifications noted within the carotid siphons. Skull: Scalp soft tissues and calvarium within normal limits. Sinuses: Paranasal sinuses are largely clear. No significant mastoid effusion. Orbits: Globes and orbital soft tissues within normal limits. Review of the MIP images confirms the above findings CTA NECK FINDINGS Aortic arch: Visualized aortic arch of normal caliber with normal 3 vessel morphology. Moderate atheromatous change about the arch and origins of the great vessels without high-grade stenosis. Right carotid system: Right common carotid artery widely patent proximally. Eccentric calcified plaque at the distal right CCA with no more than mild stenosis. Vascular stent in place across the right carotid bifurcation. Small focus of intraluminal soft plaque/thrombus within the stent itself with short-segment 40-50% stenosis (series 8, image 165). Flow within the stent otherwise widely patent. Right ICA widely patent distally without stenosis, dissection or occlusion. Left carotid system: Calcified plaque at the  origin of the left CCA with associated stenosis of up to 50% by NASCET criteria. Left CCA otherwise patent distally to the bifurcation without significant stenosis. Mild scattered atheromatous plaque about the left bifurcation without flow-limiting stenosis. Left ICA patent distally to the skull base without significant stenosis, dissection or occlusion. Vertebral arteries: Both vertebral arteries arise from the subclavian arteries. Right vertebral artery dominant, with a diffusely hypoplastic left vertebral artery. Atheromatous change at the origin of the diminutive left vertebral artery with focal severe ostial stenosis (series 8, image 77). Additional scattered mild nonstenotic plaque within the vertebral arteries bilaterally without additional high-grade stenosis, dissection or occlusion. Skeleton: Better evaluated on concomitant CT of the cervical spine. Multilevel cervical spondylosis noted. No worrisome osseous lesions. Other neck: No other acute soft tissue abnormality within the neck. Upper chest: Visualized upper chest demonstrates no acute finding. Centrilobular and paraseptal emphysematous changes noted. Review of the MIP images confirms the above findings CTA HEAD FINDINGS Anterior circulation: Petrous segments patent bilaterally. Scattered atheromatous plaque within the carotid siphons without hemodynamically significant stenosis. A1 segments widely patent. Normal anterior communicating artery complex. Anterior cerebral arteries patent to their distal aspects without stenosis. No M1 stenosis or occlusion. Normal MCA bifurcations. Distal MCA branches well perfused and symmetric. Posterior circulation: Both vertebral arteries  patent to the vertebrobasilar junction without stenosis. Right PICA patent. Left PICA not seen. Basilar patent to its distal aspect without stenosis. Superior cerebellar and posterior cerebral arteries patent bilaterally. Venous sinuses: Grossly patent allowing for timing the  contrast bolus. Anatomic variants: None significant.  No aneurysm. Review of the MIP images confirms the above findings IMPRESSION: CT HEAD IMPRESSION: 1. No acute intracranial abnormality. 2. Chronic right frontal lobe infarct, right MCA distribution. 3. Underlying age-related cerebral atrophy with mild chronic small vessel ischemic disease. CTA HEAD AND NECK IMPRESSION: 1. Negative CTA for large vessel occlusion. 2. Short-segment 50% stenosis at the origin of the left common carotid artery. 3. Vascular stent in place across the right carotid bifurcation. Small focus of intraluminal soft plaque/thrombus with associated short-segment stenosis of up to approximately 40-50% by NASCET criteria. Flow within the stent otherwise widely patent. 4. 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 Right vertebral artery dominant. 5. Additional scattered atherosclerotic change elsewhere about the major arterial vasculature of the head and neck as above. No other hemodynamically significant or correctable stenosis. 6. Emphysema (ICD10-J43.9). Electronically Signed   By: Rise Mu M.D.   On: 08/31/2020 23:55   CT Angio Neck W and/or Wo Contrast  Result Date: 08/31/2020 CLINICAL DATA:  Initial evaluation for possible stroke, left-sided deficits. EXAM: CT ANGIOGRAPHY HEAD AND NECK TECHNIQUE: Multidetector CT imaging of the head and neck was performed using the standard protocol during bolus administration of intravenous contrast. Multiplanar CT image reconstructions and MIPs were obtained to evaluate the vascular anatomy. Carotid stenosis measurements (when applicable) are obtained utilizing NASCET criteria, using the distal internal carotid diameter as the denominator. CONTRAST:  OMNIPAQUE IOHEXOL 350 MG/ML SOLN COMPARISON:  Prior study from 01/15/2019. FINDINGS: CT HEAD FINDINGS Brain: Generalized age-related cerebral atrophy with mild chronic small vessel ischemic disease.  Encephalomalacia involving the left frontal lobe compatible with chronic left MCA territory infarct. No acute intracranial hemorrhage. No acute large vessel territory infarct. No mass lesion, midline shift or mass effect. No hydrocephalus or extra-axial fluid collection. Vascular: No hyperdense vessel. Scattered vascular calcifications noted within the carotid siphons. Skull: Scalp soft tissues and calvarium within normal limits. Sinuses: Paranasal sinuses are largely clear. No significant mastoid effusion. Orbits: Globes and orbital soft tissues within normal limits. Review of the MIP images confirms the above findings CTA NECK FINDINGS Aortic arch: Visualized aortic arch of normal caliber with normal 3 vessel morphology. Moderate atheromatous change about the arch and origins of the great vessels without high-grade stenosis. Right carotid system: Right common carotid artery widely patent proximally. Eccentric calcified plaque at the distal right CCA with no more than mild stenosis. Vascular stent in place across the right carotid bifurcation. Small focus of intraluminal soft plaque/thrombus within the stent itself with short-segment 40-50% stenosis (series 8, image 165). Flow within the stent otherwise widely patent. Right ICA widely patent distally without stenosis, dissection or occlusion. Left carotid system: Calcified plaque at the origin of the left CCA with associated stenosis of up to 50% by NASCET criteria. Left CCA otherwise patent distally to the bifurcation without significant stenosis. Mild scattered atheromatous plaque about the left bifurcation without flow-limiting stenosis. Left ICA patent distally to the skull base without significant stenosis, dissection or occlusion. Vertebral arteries: Both vertebral arteries arise from the subclavian arteries. Right vertebral artery dominant, with a diffusely hypoplastic left vertebral artery. Atheromatous change at the origin of the diminutive left vertebral  artery with focal severe ostial stenosis (  series 8, image 77). Additional scattered mild nonstenotic plaque within the vertebral arteries bilaterally without additional high-grade stenosis, dissection or occlusion. Skeleton: Better evaluated on concomitant CT of the cervical spine. Multilevel cervical spondylosis noted. No worrisome osseous lesions. Other neck: No other acute soft tissue abnormality within the neck. Upper chest: Visualized upper chest demonstrates no acute finding. Centrilobular and paraseptal emphysematous changes noted. Review of the MIP images confirms the above findings CTA HEAD FINDINGS Anterior circulation: Petrous segments patent bilaterally. Scattered atheromatous plaque within the carotid siphons without hemodynamically significant stenosis. A1 segments widely patent. Normal anterior communicating artery complex. Anterior cerebral arteries patent to their distal aspects without stenosis. No M1 stenosis or occlusion. Normal MCA bifurcations. Distal MCA branches well perfused and symmetric. Posterior circulation: Both vertebral arteries patent to the vertebrobasilar junction without stenosis. Right PICA patent. Left PICA not seen. Basilar patent to its distal aspect without stenosis. Superior cerebellar and posterior cerebral arteries patent bilaterally. Venous sinuses: Grossly patent allowing for timing the contrast bolus. Anatomic variants: None significant.  No aneurysm. Review of the MIP images confirms the above findings IMPRESSION: CT HEAD IMPRESSION: 1. No acute intracranial abnormality. 2. Chronic right frontal lobe infarct, right MCA distribution. 3. Underlying age-related cerebral atrophy with mild chronic small vessel ischemic disease. CTA HEAD AND NECK IMPRESSION: 1. Negative CTA for large vessel occlusion. 2. Short-segment 50% stenosis at the origin of the left common carotid artery. 3. Vascular stent in place across the right carotid bifurcation. Small focus of intraluminal soft  plaque/thrombus with associated short-segment stenosis of up to approximately 40-50% by NASCET criteria. Flow within the stent otherwise widely patent. 4. 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 Right vertebral artery dominant. 5. Additional scattered atherosclerotic change elsewhere about the major arterial vasculature of the head and neck as above. No other hemodynamically significant or correctable stenosis. 6. Emphysema (ICD10-J43.9). Electronically Signed   By: Rise Mu M.D.   On: 08/31/2020 23:55   CT Cervical Spine Wo Contrast  Result Date: 08/31/2020 CLINICAL DATA:  Found down, weakness EXAM: CT CERVICAL SPINE WITHOUT CONTRAST TECHNIQUE: Multidetector CT imaging of the cervical spine was performed without intravenous contrast. Multiplanar CT image reconstructions were also generated. COMPARISON:  None. FINDINGS: Alignment: Alignment is anatomic. Skull base and vertebrae: No acute displaced fracture. Soft tissues and spinal canal: No prevertebral fluid or swelling. No visible canal hematoma. Stent is identified within the right carotid artery. Disc levels: Multilevel cervical spondylosis greatest at C4-5, C5-6, and C6-7. Symmetrical neural foraminal encroachment is seen at these levels. Upper chest: Airways patent. Emphysematous changes are seen at the lung apices. Other: Reconstructed images demonstrate no additional findings. IMPRESSION: 1. Lower cervical spondylosis.  No acute fracture. Electronically Signed   By: Sharlet Salina M.D.   On: 08/31/2020 22:51   MR BRAIN WO CONTRAST  Result Date: 09/01/2020 CLINICAL DATA:  Initial evaluation for neuro deficit, stroke suspected. Left-sided weakness. EXAM: MRI HEAD WITHOUT CONTRAST TECHNIQUE: Multiplanar, multiecho pulse sequences of the brain and surrounding structures were obtained without intravenous contrast. COMPARISON:  Prior CTA from 08/31/2020. FINDINGS: Brain: Diffuse prominence of the CSF  containing spaces compatible generalized age-related cerebral atrophy. Scattered patchy T2/FLAIR hyperintensity within the periventricular white matter most consistent with chronic small vessel ischemic disease, mild for age. Encephalomalacia and gliosis involving the right frontoparietal region consistent with a chronic right MCA territory infarct. Associated scattered chronic hemosiderin staining present within this region as well. Additional small remote lacunar infarct present at  the right thalamus. No abnormal foci of restricted diffusion to suggest acute or subacute ischemia. Gray-white matter differentiation maintained. No other areas of remote cortical infarction. No evidence for acute intracranial hemorrhage. No mass lesion, midline shift or mass effect. No hydrocephalus or extra-axial fluid collection. Pituitary gland and suprasellar region within normal limits. Midline structures intact. Vascular: Major intracranial vascular flow voids are maintained. Skull and upper cervical spine: Craniocervical junction within normal limits. Bone marrow signal intensity normal. No scalp soft tissue abnormality. Sinuses/Orbits: Globes and orbital soft tissues within normal limits. Paranasal sinuses are largely clear. No significant mastoid effusion. Inner ear structures grossly normal. Other: None. IMPRESSION: 1. No acute intracranial infarct or other abnormality. 2. Chronic right MCA territory infarct, with additional remote lacunar infarct involving the right thalamus. 3. Underlying age-related cerebral atrophy with mild chronic small vessel ischemic disease. Electronically Signed   By: Rise Mu M.D.   On: 09/01/2020 03:49   DG Chest Portable 1 View  Result Date: 08/31/2020 CLINICAL DATA:  Altered mental status. Left-sided weakness and slurred speech. EXAM: PORTABLE CHEST 1 VIEW COMPARISON:  Radiograph 08/10/2020 FINDINGS: The cardiomediastinal contours are normal. Atherosclerosis of the thoracic  aorta. Improved bronchial thickening from prior exam. Chronic biapical pleuroparenchymal scarring. Pulmonary vasculature is normal. No consolidation, pleural effusion, or pneumothorax. No acute osseous abnormalities are seen. IMPRESSION: No acute chest findings. Electronically Signed   By: Narda Rutherford M.D.   On: 08/31/2020 21:08     Medical Consultants:    None.  Anti-Infectives:   none  Subjective:    Bryan Davies he denies any chest pain or shortness of breath he does relate some left shoulder pain.  Left-sided leg weakness.  Objective:    Vitals:   09/01/20 0400 09/01/20 0500 09/01/20 0600 09/01/20 0630  BP: 109/66 100/70 104/67 120/77  Pulse: 100 98 98 100  Resp: 16 (!) 21 (!) 21 20  Temp:      TempSrc:      SpO2: 100% 98% 97% 98%   SpO2: 98 %   Intake/Output Summary (Last 24 hours) at 09/01/2020 0735 Last data filed at 08/31/2020 2341 Gross per 24 hour  Intake 1000 ml  Output --  Net 1000 ml   There were no vitals filed for this visit.  Exam: General exam: In no acute distress. Respiratory system: Good air movement and clear to auscultation. Cardiovascular system: S1 & S2 heard, RRR. No JVD. Gastrointestinal system: Abdomen is nondistended, soft and nontender.  Central nervous system: Awake alert and oriented x2 able to move all 4 extremities without any difficulties, the left side does not appear weak there is limited active movement on the left side Extremities: No pedal edema. Skin: No rashes, lesions or ulcers Psychiatry: Judgement and insight appear normal. Mood & affect appropriate.    Data Reviewed:    Labs: Basic Metabolic Panel: Recent Labs  Lab 08/31/20 2034 08/31/20 2034 08/31/20 2247 09/01/20 0203  NA 146*  --  146* 149*  K 4.8   < > 4.2 3.9  CL 110  --  111 111  CO2 19*  --   --  26  GLUCOSE 183*  --  148* 113*  BUN 80*  --  78* 76*  CREATININE 1.50*  --  1.30* 1.40*  CALCIUM 9.4  --   --  9.3   < > = values in this  interval not displayed.   GFR CrCl cannot be calculated (Unknown ideal weight.). Liver Function Tests: Recent Labs  Lab 08/31/20 2034 09/01/20 0203  AST 388* 477*  ALT 471* 614*  ALKPHOS 73 72  BILITOT 1.8* 1.7*  PROT 7.2 6.8  ALBUMIN 3.7 3.6   No results for input(s): LIPASE, AMYLASE in the last 168 hours. Recent Labs  Lab 08/31/20 2038  AMMONIA 62*   Coagulation profile Recent Labs  Lab 09/01/20 0203  INR 1.2   COVID-19 Labs  No results for input(s): DDIMER, FERRITIN, LDH, CRP in the last 72 hours.  Lab Results  Component Value Date   SARSCOV2NAA NEGATIVE 08/31/2020   SARSCOV2NAA NEGATIVE 09/12/2019   SARSCOV2NAA NEGATIVE 05/03/2019    CBC: Recent Labs  Lab 08/31/20 2034 08/31/20 2247 09/01/20 0203  WBC 19.4*  --  18.4*  NEUTROABS 17.0*  --  16.2*  HGB 16.5 12.9* 15.3  HCT 48.8 38.0* 45.0  MCV 91.4  --  90.5  PLT 212  --  204   Cardiac Enzymes: Recent Labs  Lab 09/01/20 0134  CKTOTAL 4,614*   BNP (last 3 results) No results for input(s): PROBNP in the last 8760 hours. CBG: Recent Labs  Lab 08/31/20 2024  GLUCAP 178*   D-Dimer: No results for input(s): DDIMER in the last 72 hours. Hgb A1c: Recent Labs    09/01/20 0256  HGBA1C 6.0*   Lipid Profile: Recent Labs    09/01/20 0257  CHOL 143  HDL 46  LDLCALC 77  TRIG 102  CHOLHDL 3.1   Thyroid function studies: No results for input(s): TSH, T4TOTAL, T3FREE, THYROIDAB in the last 72 hours.  Invalid input(s): FREET3 Anemia work up: No results for input(s): VITAMINB12, FOLATE, FERRITIN, TIBC, IRON, RETICCTPCT in the last 72 hours. Sepsis Labs: Recent Labs  Lab 08/31/20 2034 09/01/20 0203  WBC 19.4* 18.4*   Microbiology Recent Results (from the past 240 hour(s))  Respiratory Panel by RT PCR (Flu A&B, Covid) - Nasopharyngeal Swab     Status: None   Collection Time: 08/31/20  8:40 PM   Specimen: Nasopharyngeal Swab  Result Value Ref Range Status   SARS Coronavirus 2 by RT PCR  NEGATIVE NEGATIVE Final    Comment: (NOTE) SARS-CoV-2 target nucleic acids are NOT DETECTED.  The SARS-CoV-2 RNA is generally detectable in upper respiratoy specimens during the acute phase of infection. The lowest concentration of SARS-CoV-2 viral copies this assay can detect is 131 copies/mL. A negative result does not preclude SARS-Cov-2 infection and should not be used as the sole basis for treatment or other patient management decisions. A negative result may occur with  improper specimen collection/handling, submission of specimen other than nasopharyngeal swab, presence of viral mutation(s) within the areas targeted by this assay, and inadequate number of viral copies (<131 copies/mL). A negative result must be combined with clinical observations, patient history, and epidemiological information. The expected result is Negative.  Fact Sheet for Patients:  https://www.moore.com/https://www.fda.gov/media/142436/download  Fact Sheet for Healthcare Providers:  https://www.young.biz/https://www.fda.gov/media/142435/download  This test is no t yet approved or cleared by the [place] FDA and  has been authorized for detection and/or diagnosis of SARS-CoV-2 by FDA under an Emergency Use Authorization (EUA). This EUA will remain  in effect (meaning this test can be used) for the duration of the COVID-19 declaration under Section 564(b)(1) of the Act, 21 U.S.C. section 360bbb-3(b)(1), unless the authorization is terminated or revoked sooner.     Influenza A by PCR NEGATIVE NEGATIVE Final   Influenza B by PCR NEGATIVE NEGATIVE Final    Comment: (NOTE) The Xpert Xpress SARS-CoV-2/FLU/RSV assay is intended  as an aid in  the diagnosis of influenza from Nasopharyngeal swab specimens and  should not be used as a sole basis for treatment. Nasal washings and  aspirates are unacceptable for Xpert Xpress SARS-CoV-2/FLU/RSV  testing.  Fact Sheet for Patients: https://www.moore.com/  Fact Sheet for  Healthcare Providers: https://www.young.biz/  This test is not yet approved or cleared by the [place] FDA and  has been authorized for detection and/or diagnosis of SARS-CoV-2 by  FDA under an Emergency Use Authorization (EUA). This EUA will remain  in effect (meaning this test can be used) for the duration of the  Covid-19 declaration under Section 564(b)(1) of the Act, 21  U.S.C. section 360bbb-3(b)(1), unless the authorization is  terminated or revoked. Performed at [facility], [address], [place], [place] [address]      Medications:   .  stroke: mapping our early stages of recovery book   Does not apply Once  . aspirin  300 mg Rectal Daily   Or  . aspirin  325 mg Oral Daily  . enoxaparin (LOVENOX) injection  40 mg Subcutaneous Q24H  . haloperidol decanoate  100 mg Intramuscular Q30 days  . nicotine  14 mg Transdermal Daily   Continuous Infusions: . dextrose 100 mL/hr at 09/01/20 0652      LOS: 0 days   Marinda Elk  [facility]  09/01/2020, 7:35 AM

## 2020-09-01 NOTE — ED Notes (Signed)
Called report at 0939am to inpatient

## 2020-09-01 NOTE — Consult Note (Signed)
Referring Physician: Dr. Toniann Fail    Chief Complaint: New left sided weakness  HPI: Bryan Davies is a 69 y.o. male with prior stroke in March of 2020, s/p right carotid stenting, as well as a PMHx of shingles, HTN, bipolar disorder and anxiety, who presented to the [facility] ED via EMS on Wednesday with new onset of left-sided weakness. He was last known to be relatively normal on October 31. His girlfriend states that on that day, she had called him and he had been complaining of stroke-like symptoms. She tried to convince him to call and ambulance, but he insisted repeatedly that he was not going to the hospital. She was unable to see him until Tuesday due to her other responsibilities. When she came by to see him today, she found him face down on the floor. EMS was called and on their arrival he had left sided arm and leg weakness as well as slurred speech. On arrival to the ED, the patient was alert and oriented x 4. He stated that he was up and moving around earlier on Tuesday, but was unsure about falling. He acknowledged that he was weak on the left side, but also endorsed that the weakness "comes and goes". He was not able to describe on his own when he thought he was LKN. Based upon his dehydrated state and elevated CK, he most likely was down for > 24 hours and may not be recalling correctly when claiming that he was up and walking earlier on Tuesday.   CT HEAD IMPRESSION: 1. No acute intracranial abnormality. 2. Chronic right frontal lobe infarct, right MCA distribution. 3. Underlying age-related cerebral atrophy with mild chronic small vessel ischemic disease.  CTA HEAD AND NECK IMPRESSION: 1. Negative CTA for large vessel occlusion. 2. Short-segment 50% stenosis at the origin of the left common carotid artery. 3. Vascular stent in place across the right carotid bifurcation. Small focus of intraluminal soft plaque/thrombus with associated short-segment stenosis of up to approximately  40-50% by NASCET criteria. Flow within the stent otherwise widely patent. 4. 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 Right vertebral artery dominant. 5. Additional scattered atherosclerotic change elsewhere about the major arterial vasculature of the head and neck as above. No other hemodynamically significant or correctable stenosis.  Home medications include ASA, Brilinta and atorvastatin.   LSN: Saturday tPA Given: Out of time window  Past Medical History:  Diagnosis Date   Anxiety    Hypertension    Shingles 2013   Shoulder pain     Past Surgical History:  Procedure Laterality Date   IR ANGIO VERTEBRAL SEL SUBCLAVIAN INNOMINATE UNI R MOD SED  01/16/2019   IR CT HEAD LTD  01/16/2019   IR INTRAVSC STENT CERV CAROTID W/O EMB-PROT MOD SED INC ANGIO  01/16/2019   IR PERCUTANEOUS ART THROMBECTOMY/INFUSION INTRACRANIAL INC DIAG ANGIO  01/16/2019   RADIOLOGY WITH ANESTHESIA N/A 01/15/2019   Procedure: IR WITH ANESTHESIA;  Surgeon: Julieanne Cotton, MD;  Location: [facility] OR;  Service: Radiology;  Laterality: N/A;    Family History  Family history unknown: Yes   Social History:  reports that he has been smoking cigarettes. He has a 100.00 pack-year smoking history. He has never used smokeless tobacco. He reports that he does not drink alcohol and does not use drugs.  Allergies:  Allergies  Allergen Reactions   Pollen Extract Other (See Comments)    Sneezing     Medications:  Current Facility-Administered Medications on  File Prior to Encounter  Medication Dose Route Frequency Provider Last Rate Last Admin   haloperidol decanoate (HALDOL DECANOATE) 100 MG/ML injection 100 mg  100 mg Intramuscular Q30 days Toy Cookey E, NP   100 mg at 08/08/20 1320   Current Outpatient Medications on File Prior to Encounter  Medication Sig Dispense Refill   amLODipine (NORVASC) 5 MG tablet Take 1 tablet (5 mg total) by mouth daily. 90 tablet  0   aspirin 81 MG chewable tablet Chew 1 tablet (81 mg total) by mouth daily.     atorvastatin (LIPITOR) 40 MG tablet Take 1 tablet (40 mg total) by mouth daily. 30 tablet 11   Deutetrabenazine (AUSTEDO) 9 MG TABS Take 18 mg by mouth 2 (two) times daily. 30 tablet 2   divalproex (DEPAKOTE ER) 250 MG 24 hr tablet TAKE THREE TABLETS (750 MG TOTAL) BY MOUTH AT BEDTIME. (Patient taking differently: Take 750 mg by mouth at bedtime. ) 90 tablet 2   gabapentin (NEURONTIN) 300 MG capsule Take 1 capsule (300 mg total) by mouth 2 (two) times daily. 60 capsule 11   losartan (COZAAR) 50 MG tablet Take 1 tablet (50 mg total) by mouth daily. 90 tablet 2   meloxicam (MOBIC) 7.5 MG tablet Take 1 tablet (7.5 mg total) by mouth daily. 15 tablet 0   tamsulosin (FLOMAX) 0.4 MG CAPS capsule Take 0.4 mg by mouth daily.     ticagrelor (BRILINTA) 90 MG TABS tablet Take 1 tablet (90 mg total) by mouth 2 (two) times daily. 60 tablet 11   benztropine (COGENTIN) 0.5 MG tablet Take 1 tablet (0.5 mg total) by mouth 2 (two) times daily. (Patient not taking: Reported on 08/31/2020) 60 tablet 2   naphazoline-pheniramine (NAPHCON-A) 0.025-0.3 % ophthalmic solution Place 2 drops into the left eye 4 (four) times daily as needed for eye irritation. 5 mL 0   nicotine (NICODERM CQ - DOSED IN MG/24 HOURS) 14 mg/24hr patch Place 1 patch (14 mg total) onto the skin daily. 28 patch 0   sennosides-docusate sodium (SENOKOT-S) 8.6-50 MG tablet Take 2 tablets by mouth daily as needed for constipation.     temazepam (RESTORIL) 15 MG capsule Take 1 capsule (15 mg total) by mouth at bedtime. 30 capsule 1     ROS: As per HPI. The patient is unable to provide a complete ROS due to altered mental status.   Physical Examination: Blood pressure 111/65, pulse 87, temperature (!) 97.4 F (36.3 C), temperature source Oral, resp. rate (!) 27, SpO2 98 %.  HEENT: Dry oral mucosa Lungs: Respirations unlabored Ext: Abrasions to BLE  noted  Neurologic Examination: Mental Status: Drowsy. Speech is dysarthric and sparse. Perseverates at times. Able to correctly identify the year, day of the week, city and state, but not the month. Follows all simple commands, but often requires repetition to follow. Naming for common items is preserved. Decreased level of alertness and attention. Some degree of left sided neglect.  Cranial Nerves: II:  Unable to test visual fields due to non-cooperation (keeps eyes partially closed and has poor attention). PERRL. Fixates on examiner's mask when asked.  III,IV, VI: Able to track to the right and left with conjugate EOM. Upgaze impaired.  V,VII: Smile symmetric but it is diffusely weak. Facial temp sensation intact bilaterally per patient. VIII: hearing intact to voice IX,X: Mild hypophonia XI: Head preferentially rotated to the right.  XII: midline tongue extension  Motor: Diffusely weak. In this context: RUE 4/5 RLE 4/5 LUE 2/5  proximally, 3/5 distally LLE 4-/5 Sensory: Temp and light touch subjectively intact in proximal BUE and BLE Deep Tendon Reflexes:  2+ bilateral brachioradialis and biceps, somewhat brisker on the left.  Plantars: Equivocal bilaterally  Cerebellar: No ataxia with limited FNF on the right. Unable to perform on the left.  Gait: Deferred  Results for orders placed or performed during the hospital encounter of 08/31/20 (from the past 48 hour(s))  CBG monitoring, ED     Status: Abnormal   Collection Time: 08/31/20  8:24 PM  Result Value Ref Range   Glucose-Capillary 178 (H) 70 - 99 mg/dL    Comment: Glucose reference range applies only to samples taken after fasting for at least 8 hours.  CBC with Differential     Status: Abnormal   Collection Time: 08/31/20  8:34 PM  Result Value Ref Range   WBC 19.4 (H) 4.0 - 10.5 K/uL   RBC 5.34 4.22 - 5.81 MIL/uL   Hemoglobin 16.5 13.0 - 17.0 g/dL   HCT 78.2 39 - 52 %   MCV 91.4 80.0 - 100.0 fL   MCH 30.9 26.0 - 34.0 pg    MCHC 33.8 30.0 - 36.0 g/dL   RDW 42.3 53.6 - 14.4 %   Platelets 212 150 - 400 K/uL   nRBC 0.0 0.0 - 0.2 %   Neutrophils Relative % 88 %   Neutro Abs 17.0 (H) 1.7 - 7.7 K/uL   Lymphocytes Relative 5 %   Lymphs Abs 1.0 0.7 - 4.0 K/uL   Monocytes Relative 6 %   Monocytes Absolute 1.2 (H) 0.1 - 1.0 K/uL   Eosinophils Relative 0 %   Eosinophils Absolute 0.0 0.0 - 0.5 K/uL   Basophils Relative 0 %   Basophils Absolute 0.0 0.0 - 0.1 K/uL   Immature Granulocytes 1 %   Abs Immature Granulocytes 0.17 (H) 0.00 - 0.07 K/uL    Comment: Performed at [facility], [address] [place], [place] [address]  Valproic acid level     Status: Abnormal   Collection Time: 08/31/20  8:34 PM  Result Value Ref Range   Valproic Acid Lvl 11 (L) 50.0 - 100.0 ug/mL    Comment: Performed at [facility], [address], [place], [place] [address]  Comprehensive metabolic panel     Status: Abnormal   Collection Time: 08/31/20  8:34 PM  Result Value Ref Range   Sodium 146 (H) 135 - 145 mmol/L   Potassium 4.8 3.5 - 5.1 mmol/L    Comment: SLIGHT HEMOLYSIS   Chloride 110 98 - 111 mmol/L   CO2 19 (L) 22 - 32 mmol/L   Glucose, Bld 183 (H) 70 - 99 mg/dL    Comment: Glucose reference range applies only to samples taken after fasting for at least 8 hours.   BUN 80 (H) 8 - 23 mg/dL   Creatinine, Ser 1.95 (H) 0.61 - 1.24 mg/dL   Calcium 9.4 8.9 - 09.3 mg/dL   Total Protein 7.2 6.5 - 8.1 g/dL   Albumin 3.7 3.5 - 5.0 g/dL   AST 267 (H) 15 - 41 U/L   ALT 471 (H) 0 - 44 U/L   Alkaline Phosphatase 73 38 - 126 U/L   Total Bilirubin 1.8 (H) 0.3 - 1.2 mg/dL   GFR, Estimated 51 (L) >60 mL/min    Comment: (NOTE) Calculated using the CKD-EPI Creatinine Equation (2021)    Anion gap 17 (H) 5 - 15    Comment: Performed at  [facility], [address], [place], [place] [address]  Troponin I (High Sensitivity)     Status: Abnormal   Collection Time: 08/31/20  8:34 PM  Result Value Ref Range   Troponin  I (High Sensitivity) 421 (HH) <18 ng/L    Comment: CRITICAL RESULT CALLED TO, READ BACK BY AND VERIFIED WITH: A.COSTALES,RN  08/31/2020 VANG.J (NOTE) Elevated high sensitivity troponin I (hsTnI) values and significant  changes across serial measurements may suggest ACS but many other  chronic and acute conditions are known to elevate hsTnI results.  Refer to the Links section for chest pain algorithms and additional  guidance. Performed at [facility], [address], [place], [place] [address]   Ammonia     Status: Abnormal   Collection Time: 08/31/20  8:38 PM  Result Value Ref Range   Ammonia 62 (H) 9 - 35 umol/L    Comment: Performed at [facility], [address], [place], [place] [address]  Ethanol     Status: None   Collection Time: 08/31/20  8:39 PM  Result Value Ref Range   Alcohol, Ethyl (B) <10 <10 mg/dL    Comment: (NOTE) Lowest detectable limit for serum alcohol is 10 mg/dL.  For medical purposes only. Performed at [facility], [address], [place], [place] [address]   Respiratory Panel by RT PCR (Flu A&B, Covid) - Nasopharyngeal Swab     Status: None   Collection Time: 08/31/20  8:40 PM   Specimen: Nasopharyngeal Swab  Result Value Ref Range   SARS Coronavirus 2 by RT PCR NEGATIVE NEGATIVE    Comment: (NOTE) SARS-CoV-2 target nucleic acids are NOT DETECTED.  The SARS-CoV-2 RNA is generally detectable in upper respiratoy specimens during the acute phase of infection. The lowest concentration of SARS-CoV-2 viral copies this assay can detect is 131 copies/mL. A negative result does not preclude SARS-Cov-2 infection and should not be used as the sole basis for treatment or other patient management decisions. A negative result may occur with  improper specimen collection/handling, submission of specimen other than nasopharyngeal swab, presence of viral mutation(s) within the areas targeted by this assay, and inadequate number of viral  copies (<131 copies/mL). A negative result must be combined with clinical observations, patient history, and epidemiological information. The expected result is Negative.  Fact Sheet for Patients:  https://www.moore.com/  Fact Sheet for Healthcare Providers:  https://www.young.biz/  This test is no t yet approved or cleared by the [place] FDA and  has been authorized for detection and/or diagnosis of SARS-CoV-2 by FDA under an Emergency Use Authorization (EUA). This EUA will remain  in effect (meaning this test can be used) for the duration of the COVID-19 declaration under Section 564(b)(1) of the Act, 21 U.S.C. section 360bbb-3(b)(1), unless the authorization is terminated or revoked sooner.     Influenza A by PCR NEGATIVE NEGATIVE   Influenza B by PCR NEGATIVE NEGATIVE    Comment: (NOTE) The Xpert Xpress SARS-CoV-2/FLU/RSV assay is intended as an aid in  the diagnosis of influenza from Nasopharyngeal swab specimens and  should not be used as a sole basis for treatment. Nasal washings and  aspirates are unacceptable for Xpert Xpress SARS-CoV-2/FLU/RSV  testing.  Fact Sheet for Patients: https://www.moore.com/  Fact Sheet for Healthcare Providers: https://www.young.biz/  This test is not yet approved or cleared by the [place] FDA and  has been authorized for detection and/or diagnosis of SARS-CoV-2 by  FDA under an  Emergency Use Authorization (EUA). This EUA will remain  in effect (meaning this test can be used) for the duration of the  Covid-19 declaration under Section 564(b)(1) of the Act, 21  U.S.C. section 360bbb-3(b)(1), unless the authorization is  terminated or revoked. Performed at [facility], [address] [place], [place] [address]   Troponin I (High Sensitivity)     Status: Abnormal   Collection Time: 08/31/20 10:38 PM  Result Value Ref Range   Troponin I  (High Sensitivity) 407 (HH) <18 ng/L    Comment: CRITICAL VALUE NOTED.  VALUE IS CONSISTENT WITH PREVIOUSLY REPORTED AND CALLED VALUE. (NOTE) Elevated high sensitivity troponin I (hsTnI) values and significant  changes across serial measurements may suggest ACS but many other  chronic and acute conditions are known to elevate hsTnI results.  Refer to the Links section for chest pain algorithms and additional  guidance. Performed at [facility], [address] [place], [place] [address]   I-stat chem 8, ED (not at [facility] or [facility])     Status: Abnormal   Collection Time: 08/31/20 10:47 PM  Result Value Ref Range   Sodium 146 (H) 135 - 145 mmol/L   Potassium 4.2 3.5 - 5.1 mmol/L   Chloride 111 98 - 111 mmol/L   BUN 78 (H) 8 - 23 mg/dL   Creatinine, Ser 2.951.30 (H) 0.61 - 1.24 mg/dL   Glucose, Bld 621148 (H) 70 - 99 mg/dL    Comment: Glucose reference range applies only to samples taken after fasting for at least 8 hours.   Calcium, Ion 0.99 (L) 1.15 - 1.40 mmol/L   TCO2 24 22 - 32 mmol/L   Hemoglobin 12.9 (L) 13.0 - 17.0 g/dL   HCT 30.838.0 (L) 39 - 52 %   CT Angio Head W or Wo Contrast  Result Date: 08/31/2020 CLINICAL DATA:  Initial evaluation for possible stroke, left-sided deficits. EXAM: CT ANGIOGRAPHY HEAD AND NECK TECHNIQUE: Multidetector CT imaging of the head and neck was performed using the standard protocol during bolus administration of intravenous contrast. Multiplanar CT image reconstructions and MIPs were obtained to evaluate the vascular anatomy. Carotid stenosis measurements (when applicable) are obtained utilizing NASCET criteria, using the distal internal carotid diameter as the denominator. CONTRAST:  100mL OMNIPAQUE IOHEXOL 350 MG/ML SOLN COMPARISON:  Prior study from 01/15/2019. FINDINGS: CT HEAD FINDINGS Brain: Generalized age-related cerebral atrophy with mild chronic small vessel ischemic disease. Encephalomalacia involving the left frontal lobe compatible with chronic  left MCA territory infarct. No acute intracranial hemorrhage. No acute large vessel territory infarct. No mass lesion, midline shift or mass effect. No hydrocephalus or extra-axial fluid collection. Vascular: No hyperdense vessel. Scattered vascular calcifications noted within the carotid siphons. Skull: Scalp soft tissues and calvarium within normal limits. Sinuses: Paranasal sinuses are largely clear. No significant mastoid effusion. Orbits: Globes and orbital soft tissues within normal limits. Review of the MIP images confirms the above findings CTA NECK FINDINGS Aortic arch: Visualized aortic arch of normal caliber with normal 3 vessel morphology. Moderate atheromatous change about the arch and origins of the great vessels without high-grade stenosis. Right carotid system: Right common carotid artery widely patent proximally. Eccentric calcified plaque at the distal right CCA with no more than mild stenosis. Vascular stent in place across the right carotid bifurcation. Small focus of intraluminal soft plaque/thrombus within the stent itself with short-segment 40-50% stenosis (series 8, image 165). Flow within the stent otherwise widely patent. Right ICA widely patent distally without  stenosis, dissection or occlusion. Left carotid system: Calcified plaque at the origin of the left CCA with associated stenosis of up to 50% by NASCET criteria. Left CCA otherwise patent distally to the bifurcation without significant stenosis. Mild scattered atheromatous plaque about the left bifurcation without flow-limiting stenosis. Left ICA patent distally to the skull base without significant stenosis, dissection or occlusion. Vertebral arteries: Both vertebral arteries arise from the subclavian arteries. Right vertebral artery dominant, with a diffusely hypoplastic left vertebral artery. Atheromatous change at the origin of the diminutive left vertebral artery with focal severe ostial stenosis (series 8, image 77). Additional  scattered mild nonstenotic plaque within the vertebral arteries bilaterally without additional high-grade stenosis, dissection or occlusion. Skeleton: Better evaluated on concomitant CT of the cervical spine. Multilevel cervical spondylosis noted. No worrisome osseous lesions. Other neck: No other acute soft tissue abnormality within the neck. Upper chest: Visualized upper chest demonstrates no acute finding. Centrilobular and paraseptal emphysematous changes noted. Review of the MIP images confirms the above findings CTA HEAD FINDINGS Anterior circulation: Petrous segments patent bilaterally. Scattered atheromatous plaque within the carotid siphons without hemodynamically significant stenosis. A1 segments widely patent. Normal anterior communicating artery complex. Anterior cerebral arteries patent to their distal aspects without stenosis. No M1 stenosis or occlusion. Normal MCA bifurcations. Distal MCA branches well perfused and symmetric. Posterior circulation: Both vertebral arteries patent to the vertebrobasilar junction without stenosis. Right PICA patent. Left PICA not seen. Basilar patent to its distal aspect without stenosis. Superior cerebellar and posterior cerebral arteries patent bilaterally. Venous sinuses: Grossly patent allowing for timing the contrast bolus. Anatomic variants: None significant.  No aneurysm. Review of the MIP images confirms the above findings IMPRESSION: CT HEAD IMPRESSION: 1. No acute intracranial abnormality. 2. Chronic right frontal lobe infarct, right MCA distribution. 3. Underlying age-related cerebral atrophy with mild chronic small vessel ischemic disease. CTA HEAD AND NECK IMPRESSION: 1. Negative CTA for large vessel occlusion. 2. Short-segment 50% stenosis at the origin of the left common carotid artery. 3. Vascular stent in place across the right carotid bifurcation. Small focus of intraluminal soft plaque/thrombus with associated short-segment stenosis of up to  approximately 40-50% by NASCET criteria. Flow within the stent otherwise widely patent. 4. 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 Right vertebral artery dominant. 5. Additional scattered atherosclerotic change elsewhere about the major arterial vasculature of the head and neck as above. No other hemodynamically significant or correctable stenosis. 6. Emphysema (ICD10-J43.9). Electronically Signed   By: Rise Mu M.D.   On: 08/31/2020 23:55   CT Angio Neck W and/or Wo Contrast  Result Date: 08/31/2020 CLINICAL DATA:  Initial evaluation for possible stroke, left-sided deficits. EXAM: CT ANGIOGRAPHY HEAD AND NECK TECHNIQUE: Multidetector CT imaging of the head and neck was performed using the standard protocol during bolus administration of intravenous contrast. Multiplanar CT image reconstructions and MIPs were obtained to evaluate the vascular anatomy. Carotid stenosis measurements (when applicable) are obtained utilizing NASCET criteria, using the distal internal carotid diameter as the denominator. CONTRAST:  OMNIPAQUE IOHEXOL 350 MG/ML SOLN COMPARISON:  Prior study from 01/15/2019. FINDINGS: CT HEAD FINDINGS Brain: Generalized age-related cerebral atrophy with mild chronic small vessel ischemic disease. Encephalomalacia involving the left frontal lobe compatible with chronic left MCA territory infarct. No acute intracranial hemorrhage. No acute large vessel territory infarct. No mass lesion, midline shift or mass effect. No hydrocephalus or extra-axial fluid collection. Vascular: No hyperdense vessel. Scattered vascular calcifications noted within the carotid siphons.  Skull: Scalp soft tissues and calvarium within normal limits. Sinuses: Paranasal sinuses are largely clear. No significant mastoid effusion. Orbits: Globes and orbital soft tissues within normal limits. Review of the MIP images confirms the above findings CTA NECK FINDINGS Aortic arch:  Visualized aortic arch of normal caliber with normal 3 vessel morphology. Moderate atheromatous change about the arch and origins of the great vessels without high-grade stenosis. Right carotid system: Right common carotid artery widely patent proximally. Eccentric calcified plaque at the distal right CCA with no more than mild stenosis. Vascular stent in place across the right carotid bifurcation. Small focus of intraluminal soft plaque/thrombus within the stent itself with short-segment 40-50% stenosis (series 8, image 165). Flow within the stent otherwise widely patent. Right ICA widely patent distally without stenosis, dissection or occlusion. Left carotid system: Calcified plaque at the origin of the left CCA with associated stenosis of up to 50% by NASCET criteria. Left CCA otherwise patent distally to the bifurcation without significant stenosis. Mild scattered atheromatous plaque about the left bifurcation without flow-limiting stenosis. Left ICA patent distally to the skull base without significant stenosis, dissection or occlusion. Vertebral arteries: Both vertebral arteries arise from the subclavian arteries. Right vertebral artery dominant, with a diffusely hypoplastic left vertebral artery. Atheromatous change at the origin of the diminutive left vertebral artery with focal severe ostial stenosis (series 8, image 77). Additional scattered mild nonstenotic plaque within the vertebral arteries bilaterally without additional high-grade stenosis, dissection or occlusion. Skeleton: Better evaluated on concomitant CT of the cervical spine. Multilevel cervical spondylosis noted. No worrisome osseous lesions. Other neck: No other acute soft tissue abnormality within the neck. Upper chest: Visualized upper chest demonstrates no acute finding. Centrilobular and paraseptal emphysematous changes noted. Review of the MIP images confirms the above findings CTA HEAD FINDINGS Anterior circulation: Petrous segments  patent bilaterally. Scattered atheromatous plaque within the carotid siphons without hemodynamically significant stenosis. A1 segments widely patent. Normal anterior communicating artery complex. Anterior cerebral arteries patent to their distal aspects without stenosis. No M1 stenosis or occlusion. Normal MCA bifurcations. Distal MCA branches well perfused and symmetric. Posterior circulation: Both vertebral arteries patent to the vertebrobasilar junction without stenosis. Right PICA patent. Left PICA not seen. Basilar patent to its distal aspect without stenosis. Superior cerebellar and posterior cerebral arteries patent bilaterally. Venous sinuses: Grossly patent allowing for timing the contrast bolus. Anatomic variants: None significant.  No aneurysm. Review of the MIP images confirms the above findings IMPRESSION: CT HEAD IMPRESSION: 1. No acute intracranial abnormality. 2. Chronic right frontal lobe infarct, right MCA distribution. 3. Underlying age-related cerebral atrophy with mild chronic small vessel ischemic disease. CTA HEAD AND NECK IMPRESSION: 1. Negative CTA for large vessel occlusion. 2. Short-segment 50% stenosis at the origin of the left common carotid artery. 3. Vascular stent in place across the right carotid bifurcation. Small focus of intraluminal soft plaque/thrombus with associated short-segment stenosis of up to approximately 40-50% by NASCET criteria. Flow within the stent otherwise widely patent. 4. 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 Right vertebral artery dominant. 5. Additional scattered atherosclerotic change elsewhere about the major arterial vasculature of the head and neck as above. No other hemodynamically significant or correctable stenosis. 6. Emphysema (ICD10-J43.9). Electronically Signed   By: Rise Mu M.D.   On: 08/31/2020 23:55   CT Cervical Spine Wo Contrast  Result Date: 08/31/2020 CLINICAL DATA:  Found down,  weakness EXAM: CT CERVICAL SPINE WITHOUT CONTRAST TECHNIQUE: Multidetector CT imaging of  the cervical spine was performed without intravenous contrast. Multiplanar CT image reconstructions were also generated. COMPARISON:  None. FINDINGS: Alignment: Alignment is anatomic. Skull base and vertebrae: No acute displaced fracture. Soft tissues and spinal canal: No prevertebral fluid or swelling. No visible canal hematoma. Stent is identified within the right carotid artery. Disc levels: Multilevel cervical spondylosis greatest at C4-5, C5-6, and C6-7. Symmetrical neural foraminal encroachment is seen at these levels. Upper chest: Airways patent. Emphysematous changes are seen at the lung apices. Other: Reconstructed images demonstrate no additional findings. IMPRESSION: 1. Lower cervical spondylosis.  No acute fracture. Electronically Signed   By: Sharlet Salina M.D.   On: 08/31/2020 22:51   DG Chest Portable 1 View  Result Date: 08/31/2020 CLINICAL DATA:  Altered mental status. Left-sided weakness and slurred speech. EXAM: PORTABLE CHEST 1 VIEW COMPARISON:  Radiograph 08/10/2020 FINDINGS: The cardiomediastinal contours are normal. Atherosclerosis of the thoracic aorta. Improved bronchial thickening from prior exam. Chronic biapical pleuroparenchymal scarring. Pulmonary vasculature is normal. No consolidation, pleural effusion, or pneumothorax. No acute osseous abnormalities are seen. IMPRESSION: No acute chest findings. Electronically Signed   By: Narda Rutherford M.D.   On: 08/31/2020 21:08    Assessment: 69 y.o. male with stroke in March of 2020, s/p right carotid stent, now presenting with new onset of left sided weakness in the context of dehydration and being found down, most likely after an extended period of immobility.  1. Exam reveals some degree of left sided neglect, left sided weakness and altered mental status.  2. 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 Underlying age-related cerebral atrophy with mild chronic small vessel ischemic disease is noted. MRI brain confirms the chronicity of the ischemic infarction.  3. CTA of head and neck reveals short-segment 50% stenosis at the origin of the left common carotid artery. There is a stent in place across the right carotid bifurcation with a small focus of intraluminal soft plaque/thrombus with associated short-segment stenosis of up to approximately 40-50% by NASCET criteria; flow within the stent is otherwise widely patent. 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 Right vertebral artery dominant. There are additional scattered atherosclerotic change elsewhere about the major arterial vasculature of the head and neck 4. Stroke Risk Factors - prior stroke, history of shingles, atherosclerotic disease and HTN 5. Leukocytosis 6. Hyperammonemia  Recommendations: 1. HgbA1c, fasting lipid panel 2. TTE 3. PT consult, OT consult, Speech consult 4. Carotid dopplers to further assess the plaque vs thrombus seen within the right ICA stent on CTA.  5. Prophylactic therapy- Continue ASA and Brilinta 6. Hold atorvastatin for now due to elevated CK. Consider restarting if CK normalizes. 7. BP management. Out of permissive HTN time window 8. IVF and monitor I/Os in addition to BUN/Cr 9. Cardiac telemetry 10. Frequent neuro checks 11. Continue his valproic acid for his bipolar disorder   @Electronically  signed: Dr.  09/01/2020, 1:50 AM

## 2020-09-01 NOTE — Progress Notes (Signed)
Occupational Therapy Evaluation  PTA, pt lived alone and was independent with ADL and mobility. Has a girlfriend Alcario Drought), who checks on him and is a retired Music therapist. Pt currently requires mod A with mobility and Max A with LB ADL due to deficits listed below. Will benefit from rehab at [facility]. Will follow acutely.    09/01/20 1500  OT Visit Information  Last OT Received On 09/01/20  Assistance Needed +2 (for mobility progression )  History of Present Illness 69 y.o. male with history of stroke and March 2020 status post carotid stent placement with history of hypertension, COPD, ongoing tobacco abuse and bipolar disorder was brought to the ER after patient was found to be on the floor confused and weak on the left side as noticed by patient's girlfriend. MRI (-) for acute  CVA; chronic R MCA infarct with additional remote lacumar infarct incolving R thalamusl age  - related cerebral atrophy.  AKI in setting on rhabdomyolysis; elevated troponins; acute encephalopathy.    Precautions  Precautions Fall  Home Living  Family/patient expects to be discharged to: Private residence  Living Arrangements Alone  Available Help at Discharge Friend(s);Available PRN/intermittently  Type of Home Apartment  Home Access Level entry  Home Layout One level  Bathroom Shower/Tub Programmer, multimedia No  Home Equipment None  Prior Function  Level of Independence Independent  Comments Reports he was independent, but shouldve been using a RW  Communication  Communication No difficulties  Pain Assessment  Pain Assessment Faces  Faces Pain Scale 4  Pain Location L shoulder  Pain Descriptors / Indicators Grimacing;Guarding  Pain Intervention(s) Limited activity within patient's tolerance  Cognition  Arousal/Alertness Awake/alert  Behavior During Therapy Flat affect  Overall Cognitive Status Impaired/Different from baseline  Area of Impairment  Orientation;Attention;Memory;Following commands;Safety/judgement;Awareness;Problem solving  Orientation Level Disoriented to;Situation;Time  Current Attention Level Sustained  Memory Decreased recall of precautions;Decreased short-term memory  Following Commands Follows one step commands with increased time  Safety/Judgement Decreased awareness of safety;Decreased awareness of deficits  Awareness Emergent  General Comments Pt reporting he came in for a stroke, however, no new infarcts noted on imaging. Pt with slowed processing and difficulty sequencing when performing mobility tasks.  more alert and participating once in chair  Upper Extremity Assessment  Upper Extremity Assessment LUE deficits/detail;RUE deficits/detail  RUE Deficits / Details generalized weakness  LUE Deficits / Details limited due to shulder pain; PROM to 90 without apparent discomfort; choosing not to use it - most likely related to pain; x-ray (-) for fx; abnormal positioning of shoulder - may be + from impingement. Will further assess  Lower Extremity Assessment  Lower Extremity Assessment Defer to PT evaluation (skni abrasions on B knees)  Cervical / Trunk Assessment  Cervical / Trunk Assessment Kyphotic  ADL  Overall ADL's  Needs assistance/impaired  Eating/Feeding Set up;Sitting  Grooming Minimal assistance;Sitting  Upper Body Bathing Moderate assistance;Sitting  Lower Body Bathing Maximal assistance;Sit to/from stand  Upper Body Dressing  Moderate assistance;Sitting  Lower Body Dressing Maximal assistance;Sit to/from Scientist, research (life sciences) Moderate assistance;RW;Stand-pivot  Toileting- Clothing Manipulation and Hygiene Maximal assistance  Functional mobility during ADLs Moderate assistance;Rolling walker;Cueing for safety;Cueing for sequencing  General ADL Comments dissheveled appearance  Vision- History  Baseline Vision/History Wears glasses  Vision- Assessment  Additional Comments no change in vision per  pt ; deceased visual attention  Bed Mobility  Overal bed mobility Needs Assistance  Bed Mobility Supine to Sit;Sit to Supine  Supine to sit Max assist  Transfers  Overall transfer level Needs assistance  Equipment used Rolling walker (2 wheeled)  Transfers Sit to/from [employer] Transfers  Sit to Stand Min assist  Stand pivot transfers Mod assist  General transfer comment started to sit before reaching chair  Balance  Overall balance assessment Needs assistance  Sitting-balance support No upper extremity supported;Feet supported  Sitting balance-Leahy Scale Fair  Standing balance support Bilateral upper extremity supported;During functional activity  Standing balance-Leahy Scale Poor  Standing balance comment Reliant on external support   OT - End of Session  Equipment Utilized During Treatment Gait belt;Rolling walker  Activity Tolerance Patient tolerated treatment well  Patient left in chair;with call bell/phone within reach;with chair alarm set  Nurse Communication Mobility status  OT Assessment  OT Recommendation/Assessment Patient needs continued OT Services  OT Visit Diagnosis Unsteadiness on feet (R26.81);Other abnormalities of gait and mobility (R26.89);History of falling (Z91.81);Muscle weakness (generalized) (M62.81);Other symptoms and signs involving cognitive function;Pain  Pain - Right/Left Left  Pain - part of body Shoulder;Knee  OT Problem List Decreased strength;Decreased range of motion;Decreased activity tolerance;Impaired balance (sitting and/or standing);Decreased coordination;Decreased cognition;Decreased safety awareness;Decreased knowledge of use of DME or AE;Impaired UE functional use;Pain  OT Plan  OT Frequency (ACUTE ONLY) Min 2X/week  OT Treatment/Interventions (ACUTE ONLY) Self-care/ADL training;Therapeutic exercise;Neuromuscular education;DME and/or AE instruction;Therapeutic activities;Cognitive remediation/compensation;Patient/family  education;Balance training  AM-PAC OT "6 Clicks" Daily Activity Outcome Measure (Version 2)  Help from another person eating meals? 3  Help from another person taking care of personal grooming? 3  Help from another person toileting, which includes using toliet, bedpan, or urinal? 2  Help from another person bathing (including washing, rinsing, drying)? 2  Help from another person to put on and taking off regular upper body clothing? 2  Help from another person to put on and taking off regular lower body clothing? 2  6 Click Score 14  OT Recommendation  Follow Up Recommendations SNF;Supervision/Assistance - 24 hour  OT Equipment 3 in 1 bedside commode;Other (comment) (RW)  Individuals Consulted  Consulted and Agree with Results and Recommendations Patient  Acute Rehab OT Goals  Patient Stated Goal to get some chocolate milk  OT Goal Formulation With patient  Time For Goal Achievement 09/15/20  Potential to Achieve Goals Good  OT Time Calculation  OT Start Time (ACUTE ONLY) 1435  OT Stop Time (ACUTE ONLY) 1505  OT Time Calculation (min) 30 min  OT General Charges  $OT Visit 1 Visit  OT Evaluation  $OT Eval Moderate Complexity 1 Mod  OT Treatments  $Self Care/Home Management  8-22 mins  Written Expression  Dominant Hand Right

## 2020-09-01 NOTE — Evaluation (Signed)
Clinical/Bedside Swallow Evaluation Patient Details  Name: Bryan Davies MRN: [id number] Date of Birth: [birth year]  Today's Date: 09/01/2020 Time: SLP Start Time (ACUTE ONLY): 1420 SLP Stop Time (ACUTE ONLY): 1435 SLP Time Calculation (min) (ACUTE ONLY): 15 min  Past Medical History:  Past Medical History:  Diagnosis Date  . Anxiety   . Hypertension   . Shingles 2013  . Shoulder pain    Past Surgical History:  Past Surgical History:  Procedure Laterality Date  . IR ANGIO VERTEBRAL SEL SUBCLAVIAN INNOMINATE UNI R MOD SED  01/16/2019  . IR CT HEAD LTD  01/16/2019  . IR INTRAVSC STENT CERV CAROTID W/O EMB-PROT MOD SED INC ANGIO  01/16/2019  . IR PERCUTANEOUS ART THROMBECTOMY/INFUSION INTRACRANIAL INC DIAG ANGIO  01/16/2019  . RADIOLOGY WITH ANESTHESIA N/A 01/15/2019   Procedure: IR WITH ANESTHESIA;  Surgeon: Julieanne Cotton, MD;  Location: [facility] OR;  Service: Radiology;  Laterality: N/A;   HPI:  69 y.o. male with history of stroke and March 2020 status post carotid stent placement with history of hypertension, COPD, ongoing tobacco abuse and bipolar disorder was brought to the ER after patient was found to be on the floor confused and weak on the left side as noticed by patient's girlfriend. MRI (-) for acute  CVA; chronic R MCA infarct with additional remote lacumar infarct incolving R thalamusl age  - related cerebral atrophy.  AKI in setting on rhabdomyolysis; elevated troponins; acute encephalopathy.   Assessment / Plan / Recommendation Clinical Impression  Patient presents with a moderate cognitive impairment with specific deficits in memory (retrieval, recall new information), delays in cognitive progressing with problem solving and reasoning, abnormal affect with monotone voice, limited eye contact and limited initiation/spontaneous speech production. Patient was oriented to self, situation, place but not time. He completed one basic level functional mathematical problem in head (adding  money $3 +20) and was correct but with response delayed and without ability to perform similar types of questions. As patient is reportedly independent and lives alone at home, he will benefit from SLP therapy in acute setting to maximize safety, memory, reasoning, problem solving prior to discharge to next venue of care. SLP Visit Diagnosis: Dysphagia, unspecified (R13.10)    Aspiration Risk  Mild aspiration risk    Diet Recommendation Dysphagia 3 (Mech soft);Thin liquid   Liquid Administration via: Straw;Cup Medication Administration: Whole meds with puree Supervision: Patient able to self feed;Intermittent supervision to cue for compensatory strategies Compensations: Minimize environmental distractions;Slow rate;Small sips/bites Postural Changes: Seated upright at 90 degrees    Other  Recommendations Oral Care Recommendations: Oral care BID   Follow up Recommendations Skilled Nursing facility;24 hour supervision/assistance;Home health SLP      Frequency and Duration min 2x/week  1 week       Prognosis Prognosis for Safe Diet Advancement: Good      Swallow Study   General Date of Onset: 08/31/20 HPI: 69 y.o. male with history of stroke and March 2020 status post carotid stent placement with history of hypertension, COPD, ongoing tobacco abuse and bipolar disorder was brought to the ER after patient was found to be on the floor confused and weak on the left side as noticed by patient's girlfriend. MRI (-) for acute  CVA; chronic R MCA infarct with additional remote lacumar infarct incolving R thalamusl age  - related cerebral atrophy.  AKI in setting on rhabdomyolysis; elevated troponins; acute encephalopathy. Type of Study: Bedside Swallow Evaluation Previous Swallow Assessment: None Diet Prior to this Study:  NPO Temperature Spikes Noted: No Respiratory Status: Room air History of Recent Intubation: No Behavior/Cognition: Alert;Cooperative;Pleasant mood Oral Cavity Assessment:  Within Functional Limits Oral Care Completed by SLP: Yes Oral Cavity - Dentition: Poor condition;Adequate natural dentition Vision: Functional for self-feeding Self-Feeding Abilities: Able to feed self;Needs set up Baseline Vocal Quality: Normal Volitional Cough: Strong Volitional Swallow: Able to elicit    Oral/Motor/Sensory Function Overall Oral Motor/Sensory Function: Mild impairment Facial ROM: Within Functional Limits Facial Symmetry: Within Functional Limits Facial Strength: Within Functional Limits Facial Sensation: Within Functional Limits Lingual ROM: Reduced right;Reduced left Lingual Symmetry: Within Functional Limits Lingual Strength: Reduced Velum: Within Functional Limits Mandible: Within Functional Limits   Ice Chips     Thin Liquid Thin Liquid: Impaired Presentation: Straw Pharyngeal  Phase Impairments: Cough - Delayed Other Comments: Delayed cough following successive straw sips of large volumes and milldy concested sounding voice, however this resolved    Nectar Thick     Honey Thick     Puree Puree: Within functional limits   Solid     Solid: Not tested     Angela Nevin, MA, CCC-SLP Speech Therapy [facility] Acute Rehab

## 2020-09-01 NOTE — H&P (Addendum)
History and Physical    GOERGE MOHR OFB:[id number] DOB: 1951/02/03 DOA: 08/31/2020  PCP: [facility]  Patient coming from: Home.  Chief Complaint: Left-sided weakness and confusion.  History obtained from patient's girlfriend.  HPI: KAILEB MONSANTO is a 69 y.o. male with history of stroke and March 2020 status post carotid stent placement with history of hypertension, COPD, ongoing tobacco abuse and bipolar disorder was brought to the ER after patient was found to be on the floor confused and weak on the left side as noticed by patient's girlfriend.  The last time patient's girlfriend spoke to the patient was on the eve of October 31 when patient talked to her on the phone and stated that he was finding it difficult to button his shirt.  Patient was advised to come to the ER but patient refused.  Following which patient's girlfriend to check on him last evening and was found on the floor confused and was particularly found to be weak on the left side more than usual.  Per the patient's girlfriend patient usually walks without help.  ED Course: In the ER patient was initially confused but later on regain orientation.  Has weakness of the left upper and lower extremity about 3 x 5.  Per girlfriend is more than usual.  CT angiogram of the head and neck and C-spine was done.  Did not show any large vessel obstruction.  Labs were significant for creatinine 1.5 sodium 146 AST 388 and ALT 471 with total bilirubin of 1.8.  High sensitive troponin was 421 and the second 1 was 407.  Patient was denying any chest pain.  Had some left shoulder pain last week.  WBC count 19.4 ammonia level was around 62 and Depakote levels are pending.  Patient admitted for further management of generalized weakness.  On the left side concerning for stroke with acute renal failure.  LFTs elevated troponin.  Review of Systems: As per HPI, rest all negative.   Past Medical History:  Diagnosis Date  . Anxiety   .  Hypertension   . Shingles 2013  . Shoulder pain     Past Surgical History:  Procedure Laterality Date  . IR ANGIO VERTEBRAL SEL SUBCLAVIAN INNOMINATE UNI R MOD SED  01/16/2019  . IR CT HEAD LTD  01/16/2019  . IR INTRAVSC STENT CERV CAROTID W/O EMB-PROT MOD SED INC ANGIO  01/16/2019  . IR PERCUTANEOUS ART THROMBECTOMY/INFUSION INTRACRANIAL INC DIAG ANGIO  01/16/2019  . RADIOLOGY WITH ANESTHESIA N/A 01/15/2019   Procedure: IR WITH ANESTHESIA;  Surgeon: Julieanne Cotton, MD;  Location: [facility] OR;  Service: Radiology;  Laterality: N/A;     reports that he has been smoking cigarettes. He has a 100.00 pack-year smoking history. He has never used smokeless tobacco. He reports that he does not drink alcohol and does not use drugs.  Allergies  Allergen Reactions  . Pollen Extract Other (See Comments)    Sneezing     Family History  Family history unknown: Yes    Prior to Admission medications   Medication Sig Start Date End Date Taking? Authorizing Provider  amLODipine (NORVASC) 5 MG tablet Take 1 tablet (5 mg total) by mouth daily. 07/25/16  Yes Shon Hale, MD  aspirin 81 MG chewable tablet Chew 1 tablet (81 mg total) by mouth daily. 01/18/19  Yes Rinehuls, Kinnie Scales, PA-C  atorvastatin (LIPITOR) 40 MG tablet Take 1 tablet (40 mg total) by mouth daily. 01/18/19  Yes Rinehuls, Kinnie Scales, PA-C  Deutetrabenazine (AUSTEDO) 9 MG TABS Take 18 mg by mouth 2 (two) times daily. 08/24/20  Yes Toy CookeyParsons, Brittney E, NP  divalproex (DEPAKOTE ER) 250 MG 24 hr tablet TAKE THREE TABLETS (750 MG TOTAL) BY MOUTH AT BEDTIME. Patient taking differently: Take 750 mg by mouth at bedtime.  08/10/20  Yes Toy CookeyParsons, Brittney E, NP  gabapentin (NEURONTIN) 300 MG capsule Take 1 capsule (300 mg total) by mouth 2 (two) times daily. 06/08/20  Yes Toy CookeyParsons, Brittney E, NP  losartan (COZAAR) 50 MG tablet Take 1 tablet (50 mg total) by mouth daily. 06/01/19  Yes Malvin JohnsFarah, Brian, MD  meloxicam (MOBIC) 7.5 MG tablet Take 1 tablet  (7.5 mg total) by mouth daily. 08/09/19  Yes Bast, Traci A, NP  tamsulosin (FLOMAX) 0.4 MG CAPS capsule Take 0.4 mg by mouth daily. 03/27/19  Yes [provider]  ticagrelor (BRILINTA) 90 MG TABS tablet Take 1 tablet (90 mg total) by mouth 2 (two) times daily. 01/18/19  Yes Rinehuls, Kinnie Scalesavid L, PA-C  benztropine (COGENTIN) 0.5 MG tablet Take 1 tablet (0.5 mg total) by mouth 2 (two) times daily. Patient not taking: Reported on 08/31/2020 06/01/19   Malvin JohnsFarah, Brian, MD  naphazoline-pheniramine (NAPHCON-A) 0.025-0.3 % ophthalmic solution Place 2 drops into the left eye 4 (four) times daily as needed for eye irritation. 08/12/19   Fawze, Mina A, PA-C  nicotine (NICODERM CQ - DOSED IN MG/24 HOURS) 14 mg/24hr patch Place 1 patch (14 mg total) onto the skin daily. 06/08/20   Shanna CiscoParsons, Brittney E, NP  sennosides-docusate sodium (SENOKOT-S) 8.6-50 MG tablet Take 2 tablets by mouth daily as needed for constipation.    [provider]  temazepam (RESTORIL) 15 MG capsule Take 1 capsule (15 mg total) by mouth at bedtime. 06/01/19   Malvin JohnsFarah, Brian, MD    Physical Exam: Constitutional: Moderately built and nourished. Vitals:   08/31/20 2023 08/31/20 2145 08/31/20 2245 09/01/20 0145  BP: 113/69 117/73 111/65 107/65  Pulse: (!) 102 (!) 107 87 96  Resp: (!) 25 (!) 22 (!) 27 (!) 22  Temp: (!) 97.4 F (36.3 C)     TempSrc: Oral     SpO2: 97% 98% 98% 98%   Eyes: Anicteric no pallor. ENMT: No discharge from the ears eyes nose or mouth. Neck: No mass felt.  No neck rigidity. Respiratory: No rhonchi or crepitations. Cardiovascular: S1-S2 heard. Abdomen: Soft nontender bowel sounds present. Musculoskeletal: Chronic skin changes of the extremities.  No edema. Skin: Chronic skin changes of the extremities.  No edema. Neurologic: Alert awake oriented to his name and place has weakness of the left upper and lower extremity around 3 x 5.  Right upper and lower extremities 5 x 5.  No facial asymmetry tongue is  midline.  Pupils equal and reacting to light. Psychiatric: Oriented to his name and place.   Labs on Admission: I have personally reviewed following labs and imaging studies  CBC: Recent Labs  Lab 08/31/20 2034 08/31/20 2247  WBC 19.4*  --   NEUTROABS 17.0*  --   HGB 16.5 12.9*  HCT 48.8 38.0*  MCV 91.4  --   PLT 212  --    Basic Metabolic Panel: Recent Labs  Lab 08/31/20 2034 08/31/20 2247  NA 146* 146*  K 4.8 4.2  CL 110 111  CO2 19*  --   GLUCOSE 183* 148*  BUN 80* 78*  CREATININE 1.50* 1.30*  CALCIUM 9.4  --    GFR: CrCl cannot be calculated (Unknown ideal weight.). Liver  Function Tests: Recent Labs  Lab 08/31/20 2034  AST 388*  ALT 471*  ALKPHOS 73  BILITOT 1.8*  PROT 7.2  ALBUMIN 3.7   No results for input(s): LIPASE, AMYLASE in the last 168 hours. Recent Labs  Lab 08/31/20 2038  AMMONIA 62*   Coagulation Profile: No results for input(s): INR, PROTIME in the last 168 hours. Cardiac Enzymes: No results for input(s): CKTOTAL, CKMB, CKMBINDEX, TROPONINI in the last 168 hours. BNP (last 3 results) No results for input(s): PROBNP in the last 8760 hours. HbA1C: No results for input(s): HGBA1C in the last 72 hours. CBG: Recent Labs  Lab 08/31/20 2024  GLUCAP 178*   Lipid Profile: No results for input(s): CHOL, HDL, LDLCALC, TRIG, CHOLHDL, LDLDIRECT in the last 72 hours. Thyroid Function Tests: No results for input(s): TSH, T4TOTAL, FREET4, T3FREE, THYROIDAB in the last 72 hours. Anemia Panel: No results for input(s): VITAMINB12, FOLATE, FERRITIN, TIBC, IRON, RETICCTPCT in the last 72 hours. Urine analysis:    Component Value Date/Time   COLORURINE YELLOW 10/08/2019 1625   APPEARANCEUR CLEAR 10/08/2019 1625   LABSPEC 1.006 10/08/2019 1625   PHURINE 7.0 10/08/2019 1625   GLUCOSEU NEGATIVE 10/08/2019 1625   HGBUR NEGATIVE 10/08/2019 1625   BILIRUBINUR NEGATIVE 10/08/2019 1625   BILIRUBINUR NEG 12/05/2015 1134   KETONESUR NEGATIVE  10/08/2019 1625   PROTEINUR NEGATIVE 10/08/2019 1625   UROBILINOGEN 1.0 12/05/2015 1134   UROBILINOGEN 1.0 07/12/2011 0946   NITRITE NEGATIVE 10/08/2019 1625   LEUKOCYTESUR NEGATIVE 10/08/2019 1625   Sepsis Labs: (procalcitonin:4,lacticidven:4) ) Recent Results (from the past 240 hour(s))  Respiratory Panel by RT PCR (Flu A&B, Covid) - Nasopharyngeal Swab     Status: None   Collection Time: 08/31/20  8:40 PM   Specimen: Nasopharyngeal Swab  Result Value Ref Range Status   SARS Coronavirus 2 by RT PCR NEGATIVE NEGATIVE Final    Comment: (NOTE) SARS-CoV-2 target nucleic acids are NOT DETECTED.  The SARS-CoV-2 RNA is generally detectable in upper respiratoy specimens during the acute phase of infection. The lowest concentration of SARS-CoV-2 viral copies this assay can detect is 131 copies/mL. A negative result does not preclude SARS-Cov-2 infection and should not be used as the sole basis for treatment or other patient management decisions. A negative result may occur with  improper specimen collection/handling, submission of specimen other than nasopharyngeal swab, presence of viral mutation(s) within the areas targeted by this assay, and inadequate number of viral copies (<131 copies/mL). A negative result must be combined with clinical observations, patient history, and epidemiological information. The expected result is Negative.  Fact Sheet for Patients:  https://www.moore.com/  Fact Sheet for Healthcare Providers:  https://www.young.biz/  This test is no t yet approved or cleared by the [place] FDA and  has been authorized for detection and/or diagnosis of SARS-CoV-2 by FDA under an Emergency Use Authorization (EUA). This EUA will remain  in effect (meaning this test can be used) for the duration of the COVID-19 declaration under Section 564(b)(1) of the Act, 21 U.S.C. section 360bbb-3(b)(1), unless the authorization  is terminated or revoked sooner.     Influenza A by PCR NEGATIVE NEGATIVE Final   Influenza B by PCR NEGATIVE NEGATIVE Final    Comment: (NOTE) The Xpert Xpress SARS-CoV-2/FLU/RSV assay is intended as an aid in  the diagnosis of influenza from Nasopharyngeal swab specimens and  should not be used as a sole basis for treatment. Nasal washings and  aspirates are unacceptable for Xpert Xpress SARS-CoV-2/FLU/RSV  testing.  Fact Sheet for Patients: https://www.moore.com/  Fact Sheet for Healthcare Providers: https://www.young.biz/  This test is not yet approved or cleared by the [place] FDA and  has been authorized for detection and/or diagnosis of SARS-CoV-2 by  FDA under an Emergency Use Authorization (EUA). This EUA will remain  in effect (meaning this test can be used) for the duration of the  Covid-19 declaration under Section 564(b)(1) of the Act, 21  U.S.C. section 360bbb-3(b)(1), unless the authorization is  terminated or revoked. Performed at [facility], [address], [place], [place] [address]      Radiological Exams on Admission: CT Angio Head W or Wo Contrast  Result Date: 08/31/2020 CLINICAL DATA:  Initial evaluation for possible stroke, left-sided deficits. EXAM: CT ANGIOGRAPHY HEAD AND NECK TECHNIQUE: Multidetector CT imaging of the head and neck was performed using the standard protocol during bolus administration of intravenous contrast. Multiplanar CT image reconstructions and MIPs were obtained to evaluate the vascular anatomy. Carotid stenosis measurements (when applicable) are obtained utilizing NASCET criteria, using the distal internal carotid diameter as the denominator. CONTRAST:  OMNIPAQUE IOHEXOL 350 MG/ML SOLN COMPARISON:  Prior study from 01/15/2019. FINDINGS: CT HEAD FINDINGS Brain: Generalized age-related cerebral atrophy with mild chronic small vessel ischemic disease. Encephalomalacia involving  the left frontal lobe compatible with chronic left MCA territory infarct. No acute intracranial hemorrhage. No acute large vessel territory infarct. No mass lesion, midline shift or mass effect. No hydrocephalus or extra-axial fluid collection. Vascular: No hyperdense vessel. Scattered vascular calcifications noted within the carotid siphons. Skull: Scalp soft tissues and calvarium within normal limits. Sinuses: Paranasal sinuses are largely clear. No significant mastoid effusion. Orbits: Globes and orbital soft tissues within normal limits. Review of the MIP images confirms the above findings CTA NECK FINDINGS Aortic arch: Visualized aortic arch of normal caliber with normal 3 vessel morphology. Moderate atheromatous change about the arch and origins of the great vessels without high-grade stenosis. Right carotid system: Right common carotid artery widely patent proximally. Eccentric calcified plaque at the distal right CCA with no more than mild stenosis. Vascular stent in place across the right carotid bifurcation. Small focus of intraluminal soft plaque/thrombus within the stent itself with short-segment 40-50% stenosis (series 8, image 165). Flow within the stent otherwise widely patent. Right ICA widely patent distally without stenosis, dissection or occlusion. Left carotid system: Calcified plaque at the origin of the left CCA with associated stenosis of up to 50% by NASCET criteria. Left CCA otherwise patent distally to the bifurcation without significant stenosis. Mild scattered atheromatous plaque about the left bifurcation without flow-limiting stenosis. Left ICA patent distally to the skull base without significant stenosis, dissection or occlusion. Vertebral arteries: Both vertebral arteries arise from the subclavian arteries. Right vertebral artery dominant, with a diffusely hypoplastic left vertebral artery. Atheromatous change at the origin of the diminutive left vertebral artery with focal severe  ostial stenosis (series 8, image 77). Additional scattered mild nonstenotic plaque within the vertebral arteries bilaterally without additional high-grade stenosis, dissection or occlusion. Skeleton: Better evaluated on concomitant CT of the cervical spine. Multilevel cervical spondylosis noted. No worrisome osseous lesions. Other neck: No other acute soft tissue abnormality within the neck. Upper chest: Visualized upper chest demonstrates no acute finding. Centrilobular and paraseptal emphysematous changes noted. Review of the MIP images confirms the above findings CTA HEAD FINDINGS Anterior circulation: Petrous segments patent bilaterally. Scattered atheromatous plaque within the carotid siphons without hemodynamically significant stenosis. A1 segments widely patent. Normal anterior  communicating artery complex. Anterior cerebral arteries patent to their distal aspects without stenosis. No M1 stenosis or occlusion. Normal MCA bifurcations. Distal MCA branches well perfused and symmetric. Posterior circulation: Both vertebral arteries patent to the vertebrobasilar junction without stenosis. Right PICA patent. Left PICA not seen. Basilar patent to its distal aspect without stenosis. Superior cerebellar and posterior cerebral arteries patent bilaterally. Venous sinuses: Grossly patent allowing for timing the contrast bolus. Anatomic variants: None significant.  No aneurysm. Review of the MIP images confirms the above findings IMPRESSION: CT HEAD IMPRESSION: 1. No acute intracranial abnormality. 2. Chronic right frontal lobe infarct, right MCA distribution. 3. Underlying age-related cerebral atrophy with mild chronic small vessel ischemic disease. CTA HEAD AND NECK IMPRESSION: 1. Negative CTA for large vessel occlusion. 2. Short-segment 50% stenosis at the origin of the left common carotid artery. 3. Vascular stent in place across the right carotid bifurcation. Small focus of intraluminal soft plaque/thrombus with  associated short-segment stenosis of up to approximately 40-50% by NASCET criteria. Flow within the stent otherwise widely patent. 4. 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 Right vertebral artery dominant. 5. Additional scattered atherosclerotic change elsewhere about the major arterial vasculature of the head and neck as above. No other hemodynamically significant or correctable stenosis. 6. Emphysema (ICD10-J43.9). Electronically Signed   By: Rise Mu M.D.   On: 08/31/2020 23:55   CT Angio Neck W and/or Wo Contrast  Result Date: 08/31/2020 CLINICAL DATA:  Initial evaluation for possible stroke, left-sided deficits. EXAM: CT ANGIOGRAPHY HEAD AND NECK TECHNIQUE: Multidetector CT imaging of the head and neck was performed using the standard protocol during bolus administration of intravenous contrast. Multiplanar CT image reconstructions and MIPs were obtained to evaluate the vascular anatomy. Carotid stenosis measurements (when applicable) are obtained utilizing NASCET criteria, using the distal internal carotid diameter as the denominator. CONTRAST:  OMNIPAQUE IOHEXOL 350 MG/ML SOLN COMPARISON:  Prior study from 01/15/2019. FINDINGS: CT HEAD FINDINGS Brain: Generalized age-related cerebral atrophy with mild chronic small vessel ischemic disease. Encephalomalacia involving the left frontal lobe compatible with chronic left MCA territory infarct. No acute intracranial hemorrhage. No acute large vessel territory infarct. No mass lesion, midline shift or mass effect. No hydrocephalus or extra-axial fluid collection. Vascular: No hyperdense vessel. Scattered vascular calcifications noted within the carotid siphons. Skull: Scalp soft tissues and calvarium within normal limits. Sinuses: Paranasal sinuses are largely clear. No significant mastoid effusion. Orbits: Globes and orbital soft tissues within normal limits. Review of the MIP images confirms the above  findings CTA NECK FINDINGS Aortic arch: Visualized aortic arch of normal caliber with normal 3 vessel morphology. Moderate atheromatous change about the arch and origins of the great vessels without high-grade stenosis. Right carotid system: Right common carotid artery widely patent proximally. Eccentric calcified plaque at the distal right CCA with no more than mild stenosis. Vascular stent in place across the right carotid bifurcation. Small focus of intraluminal soft plaque/thrombus within the stent itself with short-segment 40-50% stenosis (series 8, image 165). Flow within the stent otherwise widely patent. Right ICA widely patent distally without stenosis, dissection or occlusion. Left carotid system: Calcified plaque at the origin of the left CCA with associated stenosis of up to 50% by NASCET criteria. Left CCA otherwise patent distally to the bifurcation without significant stenosis. Mild scattered atheromatous plaque about the left bifurcation without flow-limiting stenosis. Left ICA patent distally to the skull base without significant stenosis, dissection or occlusion. Vertebral arteries: Both vertebral arteries  arise from the subclavian arteries. Right vertebral artery dominant, with a diffusely hypoplastic left vertebral artery. Atheromatous change at the origin of the diminutive left vertebral artery with focal severe ostial stenosis (series 8, image 77). Additional scattered mild nonstenotic plaque within the vertebral arteries bilaterally without additional high-grade stenosis, dissection or occlusion. Skeleton: Better evaluated on concomitant CT of the cervical spine. Multilevel cervical spondylosis noted. No worrisome osseous lesions. Other neck: No other acute soft tissue abnormality within the neck. Upper chest: Visualized upper chest demonstrates no acute finding. Centrilobular and paraseptal emphysematous changes noted. Review of the MIP images confirms the above findings CTA HEAD FINDINGS  Anterior circulation: Petrous segments patent bilaterally. Scattered atheromatous plaque within the carotid siphons without hemodynamically significant stenosis. A1 segments widely patent. Normal anterior communicating artery complex. Anterior cerebral arteries patent to their distal aspects without stenosis. No M1 stenosis or occlusion. Normal MCA bifurcations. Distal MCA branches well perfused and symmetric. Posterior circulation: Both vertebral arteries patent to the vertebrobasilar junction without stenosis. Right PICA patent. Left PICA not seen. Basilar patent to its distal aspect without stenosis. Superior cerebellar and posterior cerebral arteries patent bilaterally. Venous sinuses: Grossly patent allowing for timing the contrast bolus. Anatomic variants: None significant.  No aneurysm. Review of the MIP images confirms the above findings IMPRESSION: CT HEAD IMPRESSION: 1. No acute intracranial abnormality. 2. Chronic right frontal lobe infarct, right MCA distribution. 3. Underlying age-related cerebral atrophy with mild chronic small vessel ischemic disease. CTA HEAD AND NECK IMPRESSION: 1. Negative CTA for large vessel occlusion. 2. Short-segment 50% stenosis at the origin of the left common carotid artery. 3. Vascular stent in place across the right carotid bifurcation. Small focus of intraluminal soft plaque/thrombus with associated short-segment stenosis of up to approximately 40-50% by NASCET criteria. Flow within the stent otherwise widely patent. 4. 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 Right vertebral artery dominant. 5. Additional scattered atherosclerotic change elsewhere about the major arterial vasculature of the head and neck as above. No other hemodynamically significant or correctable stenosis. 6. Emphysema (ICD10-J43.9). Electronically Signed   By: Rise Mu M.D.   On: 08/31/2020 23:55   CT Cervical Spine Wo Contrast  Result Date:  08/31/2020 CLINICAL DATA:  Found down, weakness EXAM: CT CERVICAL SPINE WITHOUT CONTRAST TECHNIQUE: Multidetector CT imaging of the cervical spine was performed without intravenous contrast. Multiplanar CT image reconstructions were also generated. COMPARISON:  None. FINDINGS: Alignment: Alignment is anatomic. Skull base and vertebrae: No acute displaced fracture. Soft tissues and spinal canal: No prevertebral fluid or swelling. No visible canal hematoma. Stent is identified within the right carotid artery. Disc levels: Multilevel cervical spondylosis greatest at C4-5, C5-6, and C6-7. Symmetrical neural foraminal encroachment is seen at these levels. Upper chest: Airways patent. Emphysematous changes are seen at the lung apices. Other: Reconstructed images demonstrate no additional findings. IMPRESSION: 1. Lower cervical spondylosis.  No acute fracture. Electronically Signed   By: Sharlet Salina M.D.   On: 08/31/2020 22:51   DG Chest Portable 1 View  Result Date: 08/31/2020 CLINICAL DATA:  Altered mental status. Left-sided weakness and slurred speech. EXAM: PORTABLE CHEST 1 VIEW COMPARISON:  Radiograph 08/10/2020 FINDINGS: The cardiomediastinal contours are normal. Atherosclerosis of the thoracic aorta. Improved bronchial thickening from prior exam. Chronic biapical pleuroparenchymal scarring. Pulmonary vasculature is normal. No consolidation, pleural effusion, or pneumothorax. No acute osseous abnormalities are seen. IMPRESSION: No acute chest findings. Electronically Signed   By: Narda Rutherford M.D.   On:  08/31/2020 21:08    EKG: Independently reviewed.  Normal sinus rhythm with nonspecific ST changes.  Assessment/Plan Principal Problem:   Left-sided weakness Active Problems:   Hypertension   Bipolar disorder (HCC)   ARF (acute renal failure) (HCC)   Elevated troponin   Elevated LFTs    1. Fall with left-sided weakness concerning for stroke which I discussed with on-call neurologist will be  getting MRI brain.  Patient did fail swallow evaluation.  We will get speech therapy evaluation patient is on aspirin at this time.  Check CK levels. 2. Acute renal failure with hypernatremia likely could be from dehydration since patient has been most likely on the floor for last 3 days.  Hydrate and follow metabolic panel. 3. Elevated troponin we will consult cardiology.  Patient is on aspirin.  Patient failed swallow.  Check 2D echo.  Denies any chest pain at this time. 4. Elevated LFTs cause not clear.  Could be from hypotensive episodes.  Patient denies taking any Tylenol.  Will check acute hepatitis panel Tylenol levels follow LFTs.  Denies any abdominal pain.  Abdomen appears benign.  If LFT shows increasing trend and will scan the abdomen. 5. Acute encephalopathy improved with hydration at this time but ammonia levels were elevated.  Will check Depakote levels.  Repeat ammonia levels. 6. History of bipolar disorder presently n.p.o.  Check Depakote levels.  Note that patient's ammonia levels are elevated.  I discussed with neurologist Dr. Otelia Limes who advised that if ammonia tends to show a rising trend and may have to hold Depakote. 7. History of hypertension allow for permissive hypertension.  If MRI brain is negative for stroke will place patient on as needed IV hydralazine. 8. History of tobacco counseling requested. 9. COPD not actively wheezing. 10. Leukocytosis could be reactionary no definite signs of any infection.  We will get blood cultures. 11. Rhabdomyolysis likely from fall.  Hydrate and follow CK levels.  Do that patient has acute renal failure with elevated troponin possible stroke elevated LFTs and generally weak will need close monitoring for any further worsening in inpatient status.     DVT prophylaxis: Lovenox. Code Status: Full code. Family Communication: Patient's friend at the bedside. Disposition Plan: To be determined. Consults called: Neurology and  cardiology. Admission status: Inpatient.   Eduard Clos MD [facility] Pager [phone].  If 7PM-7AM, please contact night-coverage [email or web address] Password [email or web address]  09/01/2020, 2:07 AM

## 2020-09-01 NOTE — Progress Notes (Signed)
Received a call from Tele. St elevation in lead V. Hospitalist notified.

## 2020-09-01 NOTE — Evaluation (Signed)
Physical Therapy Evaluation Patient Details Name: Bryan Davies MRN: [id number] DOB: 12-12-1950 Today's Date: 09/01/2020   History of Present Illness  69 y.o. male with history of stroke and March 2020 status post carotid stent placement with history of hypertension, COPD, ongoing tobacco abuse and bipolar disorder was brought to the ER after patient was found to be on the floor confused and weak on the left side as noticed by patient's girlfriend. MRI (-) for acute  CVA; chronic R MCA infarct with additional remote lacumar infarct incolving R thalamusl age  - related cerebral atrophy.  AKI in setting on rhabdomyolysis; elevated troponins; acute encephalopathy.    Clinical Impression  Pt admitted secondary to problem above with deficits below. Pt requiring mod to max A for mobility tasks this session. Attempted to take side steps and PT assisted with weightshifting, however, pt unable to take steps. Per pt he lives alone in an apartment. Feel he would benefit from SNF level therapies at d/c. Will continue to follow acutely.     Follow Up Recommendations SNF;Supervision/Assistance - 24 hour    Equipment Recommendations  Other (comment) (TBD)    Recommendations for Other Services       Precautions / Restrictions Precautions Precautions: Fall Restrictions Weight Bearing Restrictions: No      Mobility  Bed Mobility Overal bed mobility: Needs Assistance Bed Mobility: Supine to Sit;Sit to Supine     Supine to sit: Max assist Sit to supine: Mod assist   General bed mobility comments: MAx A for trunk and LE assist to come to EOB. Required assist to scoot hips to EOB. Mod A for LE assist for return to supine.     Transfers Overall transfer level: Needs assistance Equipment used: None Transfers: Sit to/from Stand Sit to Stand: Mod assist         General transfer comment: Mod A for lift assist and steadying to stand. PT stood in front of pt and had pt hold to PT arms. Attempted to  take side steps and PT assisted with weightshift, however, pt unable to take steps.   Ambulation/Gait                Stairs            Wheelchair Mobility    Modified Rankin (Stroke Patients Only)       Balance Overall balance assessment: Needs assistance Sitting-balance support: No upper extremity supported;Feet supported Sitting balance-Leahy Scale: Fair     Standing balance support: Bilateral upper extremity supported;During functional activity Standing balance-Leahy Scale: Poor Standing balance comment: Reliant on external support                              Pertinent Vitals/Pain Pain Assessment: Faces Faces Pain Scale: Hurts little more Pain Location: generalized Pain Descriptors / Indicators: Grimacing;Guarding Pain Intervention(s): Limited activity within patient's tolerance;Monitored during session;Repositioned    Home Living Family/patient expects to be discharged to:: Private residence Living Arrangements: Alone Available Help at Discharge: Friend(s);Available PRN/intermittently Type of Home: Apartment Home Access: Level entry     Home Layout: One level Home Equipment: None      Prior Function Level of Independence: Independent         Comments: Reports he was independent, but shouldve been using a RW     Hand Dominance        Extremity/Trunk Assessment   Upper Extremity Assessment Upper Extremity Assessment: Defer to OT evaluation  Lower Extremity Assessment Lower Extremity Assessment: Generalized weakness    Cervical / Trunk Assessment Cervical / Trunk Assessment: Kyphotic  Communication   Communication: No difficulties  Cognition Arousal/Alertness: Awake/alert Behavior During Therapy: Flat affect Overall Cognitive Status: No family/caregiver present to determine baseline cognitive functioning                                 General Comments: Pt reporting he came in for a stroke, however,  no new infarcts noted on imaging. Pt with slowed processing and difficulty sequencing when performing mobility tasks.       General Comments      Exercises     Assessment/Plan    PT Assessment Patient needs continued PT services  PT Problem List Decreased strength;Decreased balance;Decreased coordination;Decreased mobility;Decreased activity tolerance;Decreased cognition;Decreased safety awareness;Decreased knowledge of precautions;Decreased knowledge of use of DME       PT Treatment Interventions Gait training;DME instruction;Therapeutic activities;Functional mobility training;Therapeutic exercise;Balance training;Patient/family education    PT Goals (Current goals can be found in the Care Plan section)  Acute Rehab PT Goals Patient Stated Goal: none stated PT Goal Formulation: With patient Time For Goal Achievement: 09/15/20 Potential to Achieve Goals: Fair    Frequency Min 2X/week   Barriers to discharge Decreased caregiver support      Co-evaluation               AM-PAC PT "6 Clicks" Mobility  Outcome Measure Help needed turning from your back to your side while in a flat bed without using bedrails?: A Lot Help needed moving from lying on your back to sitting on the side of a flat bed without using bedrails?: Total Help needed moving to and from a bed to a chair (including a wheelchair)?: A Lot Help needed standing up from a chair using your arms (e.g., wheelchair or bedside chair)?: A Lot Help needed to walk in hospital room?: Total Help needed climbing 3-5 steps with a railing? : Total 6 Click Score: 9    End of Session Equipment Utilized During Treatment: Gait belt Activity Tolerance: Patient tolerated treatment well Patient left: in bed;with call bell/phone within reach;with bed alarm set;with nursing/sitter in room Nurse Communication: Mobility status PT Visit Diagnosis: Unsteadiness on feet (R26.81);Muscle weakness (generalized) (M62.81)    Time:  0947-0962 PT Time Calculation (min) (ACUTE ONLY): 14 min   Charges:   PT Evaluation $PT Eval Moderate Complexity: 1 Mod          Farley Ly, PT, DPT  Acute Rehabilitation Services  Pager: [phone] Office: [phone]   Lehman Prom 09/01/2020, 2:42 PM

## 2020-09-01 NOTE — Progress Notes (Addendum)
STROKE TEAM PROGRESS NOTE   INTERVAL HISTORY His RN is at the bedside.  Pt lying in bed, asking for "chocolate milk". Has not pass swallow yet. Failed swallow screen x 2. MRI no acute infarct, felt his left UE weakness could be recrudescence from medical issues including dehydration.    OBJECTIVE Vitals:   09/01/20 0900 09/01/20 0930 09/01/20 1019 09/01/20 1100  BP: (!) 96/58 102/67 102/64 108/71  Pulse: 95 95 92 93  Resp: 18 (!) 21 16 20   Temp:   98.9 F (37.2 C) 98.6 F (37 C)  TempSrc:   Oral Oral  SpO2: 100% 100% 96% 96%    CBC:  Recent Labs  Lab 08/31/20 2034 08/31/20 2034 08/31/20 2247 09/01/20 0203  WBC 19.4*  --   --  18.4*  NEUTROABS 17.0*  --   --  16.2*  HGB 16.5   < > 12.9* 15.3  HCT 48.8   < > 38.0* 45.0  MCV 91.4  --   --  90.5  PLT 212  --   --  204   < > = values in this interval not displayed.    Basic Metabolic Panel:  Recent Labs  Lab 08/31/20 2034 08/31/20 2034 08/31/20 2247 09/01/20 0203  NA 146*   < > 146* 149*  K 4.8   < > 4.2 3.9  CL 110   < > 111 111  CO2 19*  --   --  26  GLUCOSE 183*   < > 148* 113*  BUN 80*   < > 78* 76*  CREATININE 1.50*   < > 1.30* 1.40*  CALCIUM 9.4  --   --  9.3   < > = values in this interval not displayed.    Lipid Panel:     Component Value Date/Time   CHOL 143 09/01/2020 0257   TRIG 102 09/01/2020 0257   HDL 46 09/01/2020 0257   CHOLHDL 3.1 09/01/2020 0257   VLDL 20 09/01/2020 0257   LDLCALC 77 09/01/2020 0257   HgbA1c:  Lab Results  Component Value Date   HGBA1C 6.0 (H) 09/01/2020   Urine Drug Screen:     Component Value Date/Time   LABOPIA NONE DETECTED 09/01/2020 0234   COCAINSCRNUR NONE DETECTED 09/01/2020 0234   LABBENZ NONE DETECTED 09/01/2020 0234   AMPHETMU NONE DETECTED 09/01/2020 0234   THCU NONE DETECTED 09/01/2020 0234   LABBARB NONE DETECTED 09/01/2020 0234    Alcohol Level     Component Value Date/Time   ETH <10 08/31/2020 2039    IMAGING  CT Angio Head W or Wo  Contrast CT Angio Neck W and/or Wo Contrast 08/31/2020 IMPRESSION:   CT HEAD  IMPRESSION:  1. No acute intracranial abnormality.  2. Chronic right frontal lobe infarct, right MCA distribution.  3. Underlying age-related cerebral atrophy with mild chronic small vessel ischemic disease.   CTA HEAD AND NECK  IMPRESSION:  1. Negative CTA for large vessel occlusion.  2. Short-segment 50% stenosis at the origin of the left common carotid artery.  3. Vascular stent in place across the right carotid bifurcation. Small focus of intraluminal soft plaque/thrombus with associated short-segment stenosis of up to approximately 40-50% by NASCET criteria. Flow within the stent otherwise widely patent.  4. 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 Right vertebral artery dominant.  5. Additional scattered atherosclerotic change elsewhere about the major arterial vasculature of the head and neck as above. No other hemodynamically significant or  correctable stenosis.  6. Emphysema (ICD10-J43.9).   MR BRAIN WO CONTRAST 09/01/2020 IMPRESSION:  1. No acute intracranial infarct or other abnormality.  2. Chronic right MCA territory infarct, with additional remote lacunar infarct involving the right thalamus.  3. Underlying age-related cerebral atrophy with mild chronic small vessel ischemic disease.   DG Chest Portable 1 View 08/31/2020 IMPRESSION:  No acute chest findings.    DG Shoulder Left 09/01/2020 IMPRESSION:  1. Acromioclavicular and glenohumeral degenerative change. No acute bony abnormality.  2. High-riding left shoulder consistent with rotator cuff tear.   DG Shoulder Right 09/01/2020 IMPRESSION: Moderate degenerative joint disease of the right acromioclavicular joint. No acute abnormality seen in the right shoulder.   CT Cervical Spine Wo Contrast 08/31/2020 IMPRESSION:  1. Lower cervical spondylosis.  No acute fracture.   CT RENAL STONE  STUDY 09/01/2020 IMPRESSION:  1. No acute abnormality.  2. Horseshoe kidney.  3. 1.0 cm rounded low density mass in the anterior aspect of the kidney on the left. This could represent a complicated cyst or solid mass. Further evaluation with pre and post contrast MRI should be considered. Pre and post contrast CT could alternatively be performed, but would likely be of decreased accuracy given lesion size.  4. 6 mm probable noncalcified gallstone in the gallbladder.  5. Mildly enlarged prostate gland.   Transthoracic Echocardiogram  1. Left ventricular ejection fraction, by estimation, is 65 to 70%. The  left ventricle has normal function. The left ventricle has no regional  wall motion abnormalities. Left ventricular diastolic parameters are  consistent with Grade I diastolic  dysfunction (impaired relaxation).  2. Right ventricular systolic function is normal. The right ventricular  size is normal. There is normal pulmonary artery systolic pressure.  3. The mitral valve is normal in structure. No evidence of mitral valve  regurgitation. No evidence of mitral stenosis.  4. The aortic valve is normal in structure. Aortic valve regurgitation is  not visualized. Mild to moderate aortic valve sclerosis/calcification is  present, without any evidence of aortic stenosis.  5. The inferior vena cava is normal in size with greater than 50%  respiratory variability, suggesting right atrial pressure of 3 mmHg.   ECG - SR rate 100 BPM. (See cardiology reading for complete details)  EEG 01/17/20 EEG Abnormalities: None  Clinical Interpretation: This normal EEG is recorded in the  waking and sleep state. There was no seizure or seizure  predisposition recorded on this study. Please note that lack of  epileptiform activity on EEG does not preclude the possibility of  epilepsy.    PHYSICAL EXAM Blood pressure 108/71, pulse 93, temperature 98.6 F (37 C), temperature source Oral, resp. rate  20, SpO2 96 %.  General - Well nourished, well developed, mildly lethargic.  Ophthalmologic - fundi not visualized due to noncooperation.  Cardiovascular - Regular rhythm and rate.  Neuro - awake alert, eyes open, mild lethargy, asking for chocolate milk. He is orientated to place and year and age but not to month, he said October. No aphasia, able to name 3/3 and repeat but mild to moderate dysarthria. PERRL, no visual field deficit, moving eyes bilaterally, no gaze palsy. No significant facial weakness, tongue midline. LUE 4/5 proximal and distal, RUE 4+/5. BLE 3/5 proximal and 4/5 distally. Sensation symmetrical, FTN bilaterally slow but slower on the left, but grossly intact. Gait not tested.     ASSESSMENT/PLAN Mr. Bryan Davies is a 69 y.o. male with history of stroke in March of 2020 (right  MCA infarct 01/15/20 due to right ICA and MCA occlusions/p tPA and RI withright ICA stent resulting in TICI2breperfusion), as well as a PMHx of shingles, HTN, bipolar disorder and anxiety, presenting after he was found down at home with left sided weakness, slurred speech, and dehydrated with elevated CK. He did not receive IV t-PA due to recent stroke and unknown time of onset.   Recrudescence of old stroke symptoms in the setting of multiple medical conditions  CT head - No acute intracranial abnormality. Chronic right frontal lobe infarct  MRI head - No acute intracranial infarct or other abnormality. Chronic right MCA territory infarct, with additional remote lacunar infarct involving the right thalamus.   CTA H&N - Negative CTA for large vessel occlusion. Short-segment 50% stenosis at the origin of the left common carotid artery. Small focus of intraluminal soft plaque/thrombus with associated short-segment stenosis of up to approximately 40-50% by NASCET criteria.   2D Echo EF 65-70%  [employer] Virus 2 - negative  LDL - 77  HgbA1c - 6.0  UDS - negative  VTE prophylaxis -  Lovenox  aspirin 325 mg daily prior to admission, now on aspirin 325 mg daily. Continue on discharge.   Patient counseled to be compliant with his antithrombotic medications  Ongoing aggressive stroke risk factor management  Therapy recommendations: SNF  Disposition:  Pending  Dehydration/AKI/hypernatremia Hyperammonemia Transaminitis Leukocytosis  Na 149->147  Creatinine 1.3->1.4->1.42  Ammonium 62->37  AST/ALT 477/614  WBC 19.4->18.4  Blood culture pending  Depakote level 11 - does not feel related to high ammonia or elevated LFTs given low level  On IVF  Depakote on hold  Management per primary team  History of stroke  12/2018, admitted for loss of consciousness, status post CPR, found to have right facial droop, left upper extremity weakness.  CT showed right MCA hyperdense.  CT head neck showed right ICA occlusion with decreased MCA flow.  CT perfusion positive for new penumbra.  Stat post IR with TICI2b reperfusion with right ICA stenting.  EEG negative.  MRI showed right MCA moderate infarct.  EF > 60%.  LDL 96, A1c 6.6.  Patient discharged with aspirin and Brilinta as well as Lipitor 40.  Hypertension  Home BP meds: Cozaar ; Norvasc  Blood pressure somewhat low  . Long-term BP goal normotensive  Hyperlipidemia  Home Lipid lowering medication: Lipitor 40 mg daily  LDL 77, goal < 70  Consider to resume statin once LFTs normalized  Tobacco abuse  Current smoker  Smoking cessation counseling provided  Nicotine patch provided  Pt is willing to quit  Other Stroke Risk Factors  Advanced age  Other Active Problems  Code status - Full code    Emphysema (OEU23-N36.9)  Bipolar - on depakote  Hospital day # 0  Marvel Plan, MD PhD Stroke Neurology 09/01/2020 8:05 PM  I spent  35 minutes in total face-to-face time with the patient, more than 50% of which was spent in counseling and coordination of care, reviewing test results, images and  medication, and discussing the diagnosis, treatment plan and potential prognosis. This patient's care requiresreview of multiple databases, neurological assessment, discussion with family, other specialists and medical decision making of high complexity.   To contact Stroke Continuity provider, please refer to [email or web address]. After hours, contact General Neurology

## 2020-09-01 NOTE — Progress Notes (Signed)
  Echocardiogram 2D Echocardiogram has been performed.  Stark Bray Swaim 09/01/2020, 1:36 PM

## 2020-09-01 NOTE — ED Notes (Signed)
Pt to MRI via stretcher.

## 2020-09-02 ENCOUNTER — Inpatient Hospital Stay (HOSPITAL_COMMUNITY): Payer: Medicare Other

## 2020-09-02 DIAGNOSIS — G9340 Encephalopathy, unspecified: Secondary | ICD-10-CM

## 2020-09-02 DIAGNOSIS — M6282 Rhabdomyolysis: Secondary | ICD-10-CM | POA: Diagnosis not present

## 2020-09-02 DIAGNOSIS — E860 Dehydration: Secondary | ICD-10-CM

## 2020-09-02 DIAGNOSIS — R531 Weakness: Secondary | ICD-10-CM | POA: Diagnosis not present

## 2020-09-02 DIAGNOSIS — R7989 Other specified abnormal findings of blood chemistry: Secondary | ICD-10-CM | POA: Diagnosis not present

## 2020-09-02 DIAGNOSIS — E871 Hypo-osmolality and hyponatremia: Secondary | ICD-10-CM

## 2020-09-02 DIAGNOSIS — E7220 Disorder of urea cycle metabolism, unspecified: Secondary | ICD-10-CM

## 2020-09-02 DIAGNOSIS — I214 Non-ST elevation (NSTEMI) myocardial infarction: Secondary | ICD-10-CM | POA: Diagnosis not present

## 2020-09-02 LAB — BASIC METABOLIC PANEL
Anion gap: 8 (ref 5–15)
BUN: 62 mg/dL — ABNORMAL HIGH (ref 8–23)
CO2: 25 mmol/L (ref 22–32)
Calcium: 8.5 mg/dL — ABNORMAL LOW (ref 8.9–10.3)
Chloride: 105 mmol/L (ref 98–111)
Creatinine, Ser: 1.13 mg/dL (ref 0.61–1.24)
GFR, Estimated: >60 mL/min (ref >60)
Glucose, Bld: 114 mg/dL — ABNORMAL HIGH (ref 70–99)
Potassium: 3.6 mmol/L (ref 3.5–5.1)
Sodium: 138 mmol/L (ref 135–145)

## 2020-09-02 LAB — BLOOD CULTURE ID PANEL (REFLEXED) - BCID2

## 2020-09-02 LAB — CK: Total CK: 3065 U/L — ABNORMAL HIGH (ref 49–397)

## 2020-09-02 MED ORDER — SODIUM CHLORIDE 0.9 % IV SOLN: INTRAVENOUS | Status: AC

## 2020-09-02 MED ORDER — TAMSULOSIN HCL 0.4 MG PO CAPS
0.8000 mg | ORAL_CAPSULE | Freq: Every day | ORAL | Status: DC
  Administered 2020-09-03 – 2020-09-07 (×5): 0.8 mg via ORAL
  Filled 2020-09-02 (×6): qty 2

## 2020-09-02 MED ORDER — CHLORHEXIDINE GLUCONATE CLOTH 2 % EX PADS
6.0000 | MEDICATED_PAD | Freq: Every day | CUTANEOUS | Status: DC
  Administered 2020-09-02 – 2020-09-07 (×5): 6 via TOPICAL

## 2020-09-02 MED ORDER — LOSARTAN POTASSIUM 50 MG PO TABS
50.0000 mg | ORAL_TABLET | Freq: Every day | ORAL | Status: DC
  Administered 2020-09-02 – 2020-09-07 (×7): 50 mg via ORAL
  Filled 2020-09-02 (×6): qty 1

## 2020-09-02 MED ORDER — HALOPERIDOL DECANOATE 100 MG/ML IM SOLN
100.0000 mg | INTRAMUSCULAR | Status: DC
  Administered 2020-09-07: 100 mg via INTRAMUSCULAR
  Filled 2020-09-02: qty 1

## 2020-09-02 MED ORDER — TICAGRELOR 90 MG PO TABS: 90.0000 mg | ORAL_TABLET | Freq: Two times a day (BID) | ORAL | Status: DC

## 2020-09-02 MED ORDER — GADOBUTROL 1 MMOL/ML IV SOLN
6.5000 mL | Freq: Once | INTRAVENOUS | Status: AC | PRN
  Administered 2020-09-02: 6.5 mL via INTRAVENOUS

## 2020-09-02 MED ORDER — VANCOMYCIN HCL 750 MG/150ML IV SOLN
750.0000 mg | Freq: Two times a day (BID) | INTRAVENOUS | Status: DC
  Administered 2020-09-02 – 2020-09-03 (×3): 750 mg via INTRAVENOUS
  Filled 2020-09-02 (×3): qty 150

## 2020-09-02 MED ORDER — DEUTETRABENAZINE 12 MG PO TABS: 12.0000 mg | ORAL_TABLET | Freq: Two times a day (BID) | ORAL | Status: DC

## 2020-09-02 MED ORDER — GABAPENTIN 300 MG PO CAPS
300.0000 mg | ORAL_CAPSULE | Freq: Two times a day (BID) | ORAL | Status: DC
  Administered 2020-09-02 – 2020-09-07 (×11): 300 mg via ORAL
  Filled 2020-09-02 (×12): qty 1

## 2020-09-02 MED ORDER — DIVALPROEX SODIUM ER 500 MG PO TB24
750.0000 mg | ORAL_TABLET | Freq: Every day | ORAL | Status: DC
  Administered 2020-09-02 – 2020-09-06 (×5): 750 mg via ORAL
  Filled 2020-09-02 (×6): qty 1

## 2020-09-02 NOTE — NC FL2 (Signed)
[place] MEDICAID FL2 LEVEL OF CARE SCREENING TOOL     IDENTIFICATION  Patient Name: Bryan Davies Birthdate: 1951/02/03 Sex: male Admission Date (Current Location): 08/31/2020  [facility] and [place] Number:  Producer, television/film/video and Address:  [facility] [place]. [facility], [address] [place], [place] [address]      Provider Number: [id number]  Attending Physician Name and Address:  Marinda Elk, MD  Relative Name and Phone Number:       Current Level of Care: Hospital Recommended Level of Care: Skilled Nursing Facility Prior Approval Number:    Date Approved/Denied:   PASRR Number:    Discharge Plan: SNF    Current Diagnoses: Patient Active Problem List   Diagnosis Date Noted  . ARF (acute renal failure) (HCC) 09/01/2020  . Elevated troponin 09/01/2020  . Elevated LFTs 09/01/2020  . Non-ST elevation (NSTEMI) myocardial infarction (HCC)   . Non-traumatic rhabdomyolysis   . Left-sided weakness 08/31/2020  . Bipolar 1 disorder, mixed, moderate (HCC) 06/08/2020  . Tardive dyskinesia 06/08/2020  . Bipolar 1 disorder (HCC) 05/27/2019  . 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 Ischemic stroke (HCC) 01/15/2019  . Left shoulder pain 12/05/2015  . Urinary hesitancy 12/05/2015  . Pain in lower jaw 07/11/2015  . Hyperlipidemia 02/11/2014  . 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 Dyshydrosis 07/26/2013  . 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 Hypertension 05/21/2012  . Post herpetic neuralgia 05/21/2012  . Bipolar disorder (HCC) 05/21/2012  . Tobacco abuse 05/21/2012    Orientation RESPIRATION BLADDER Height & Weight     Self, Situation, Place  Normal Continent Weight:   Height:     BEHAVIORAL SYMPTOMS/MOOD NEUROLOGICAL BOWEL NUTRITION STATUS      Continent Diet (dysphagia 3 with thin liquids)  AMBULATORY STATUS COMMUNICATION OF NEEDS Skin   Extensive Assist Verbally Skin abrasions (abrasions to bilateral arms and knees)                        Personal Care Assistance Level of Assistance  Bathing, Feeding, Dressing Bathing Assistance: Maximum assistance Feeding assistance: Limited assistance Dressing Assistance: Maximum assistance     Functional Limitations Info  Sight, Hearing, Speech Sight Info: Adequate Hearing Info: Adequate Speech Info: Impaired (slur)    SPECIAL CARE FACTORS FREQUENCY  PT (By licensed PT), OT (By licensed OT), Speech therapy     PT Frequency: 5x/wk OT Frequency: 5x/wk     Speech Therapy Frequency: 5x/wk      Contractures Contractures Info: Not present    Additional Factors Info  Code Status, Allergies, Psychotropic Code Status Info: full Allergies Info: Pollen extract Psychotropic Info: Divalproex (Depakote)         Current Medications (09/02/2020):  This is the current hospital active medication list Current Facility-Administered Medications  Medication Dose Route Frequency Provider Last Rate Last Admin  . 0.9 %  sodium chloride infusion   Intravenous Continuous Marinda Elk, MD 75 mL/hr at 09/02/20 1114 New Bag at 09/02/20 1114  . acetaminophen (TYLENOL) tablet 650 mg  650 mg Oral Q4H PRN Eduard Clos, MD       Or  . acetaminophen (TYLENOL) 160 MG/5ML solution 650 mg  650 mg Per Tube Q4H PRN Eduard Clos, MD       Or  . acetaminophen (TYLENOL) suppository 650 mg  650 mg Rectal Q4H PRN Eduard Clos, MD      . aspirin suppository 300 mg  300 mg Rectal Daily Eduard Clos, MD   300 mg at 09/01/20 1123   Or  . aspirin tablet 325 mg  325 mg Oral Daily Eduard Clos, MD   325 mg at 09/02/20 0917  . Chlorhexidine Gluconate Cloth 2 % PADS 6 each  6 each Topical Daily Marinda Elk, MD   6 each at 09/02/20 1210  . divalproex (DEPAKOTE ER) 24 hr tablet 750 mg  750 mg Oral QHS Marinda Elk, MD      . gabapentin (NEURONTIN) capsule 300 mg  300 mg Oral BID Marinda Elk, MD      . Melene Muller ON 09/07/2020]  haloperidol decanoate (HALDOL DECANOATE) 100 MG/ML injection 100 mg  100 mg Intramuscular Q30 days David Stall, Darin Engels, MD      . hydrALAZINE (APRESOLINE) injection 10 mg  10 mg Intravenous Q4H PRN Eduard Clos, MD      . losartan (COZAAR) tablet 50 mg  50 mg Oral Daily David Stall, Darin Engels, MD      . nicotine (NICODERM CQ - dosed in mg/24 hours) patch 14 mg  14 mg Transdermal Daily Eduard Clos, MD   14 mg at 09/02/20 0488  . [START ON 09/03/2020] tamsulosin (FLOMAX) capsule 0.8 mg  0.8 mg Oral QPC breakfast Marinda Elk, MD      . vancomycin [facility]) IVPB 750 mg/150 mL  750 mg Intravenous Q12H Stevphen Rochester, RPH 150 mL/hr at 09/02/20 0332 750 mg at 09/02/20 8916     Discharge Medications: Please see discharge summary for a list of discharge medications.  Relevant Imaging Results:  Relevant Lab Results:   Additional Information SS#: [id number]  Carley Hammed, LCSWA

## 2020-09-02 NOTE — Progress Notes (Signed)
STROKE TEAM PROGRESS NOTE   INTERVAL HISTORY RN at bedside, reported that pt blood culture positive. Pt lying in bed, seems more lethargic than yesterday, but still able to following commands and cooperative with exam. However, seems to have worsening left UE weakness than yesterday. Will repeat MRI brain limited DWI only.   OBJECTIVE Vitals:   09/01/20 1541 09/01/20 1940 09/02/20 0014 09/02/20 0339  BP: 103/83 124/71 (!) 162/81 117/61  Pulse: (!) 104 (!) 101 97 86  Resp:  18 18 20   Temp: 98.6 F (37 C) 98.9 F (37.2 C) 97.6 F (36.4 C) 97.8 F (36.6 C)  TempSrc: Axillary Axillary  Oral  SpO2: 97% 98% 95% 97%   CBC:  Recent Labs  Lab 08/31/20 2034 08/31/20 2034 08/31/20 2247 09/01/20 0203  WBC 19.4*  --   --  18.4*  NEUTROABS 17.0*  --   --  16.2*  HGB 16.5   < > 12.9* 15.3  HCT 48.8   < > 38.0* 45.0  MCV 91.4  --   --  90.5  PLT 212  --   --  204   < > = values in this interval not displayed.   Basic Metabolic Panel:  Recent Labs  Lab 09/01/20 1244 09/02/20 0157  NA 147* 138  K 3.9 3.6  CL 110 105  CO2 27 25  GLUCOSE 171* 114*  BUN 79* 62*  CREATININE 1.42* 1.13  CALCIUM 9.0 8.5*   Lipid Panel:     Component Value Date/Time   CHOL 143 09/01/2020 0257   TRIG 102 09/01/2020 0257   HDL 46 09/01/2020 0257   CHOLHDL 3.1 09/01/2020 0257   VLDL 20 09/01/2020 0257   LDLCALC 77 09/01/2020 0257   HgbA1c:  Lab Results  Component Value Date   HGBA1C 6.0 (H) 09/01/2020   Urine Drug Screen:     Component Value Date/Time   LABOPIA NONE DETECTED 09/01/2020 0234   COCAINSCRNUR NONE DETECTED 09/01/2020 0234   LABBENZ NONE DETECTED 09/01/2020 0234   AMPHETMU NONE DETECTED 09/01/2020 0234   THCU NONE DETECTED 09/01/2020 0234   LABBARB NONE DETECTED 09/01/2020 0234    Alcohol Level     Component Value Date/Time   ETH <10 08/31/2020 2039    IMAGING  CT Angio Head W or Wo Contrast CT Angio Neck W and/or Wo Contrast 08/31/2020 IMPRESSION:   CT HEAD   IMPRESSION:  1. No acute intracranial abnormality.  2. Chronic right frontal lobe infarct, right MCA distribution.  3. Underlying age-related cerebral atrophy with mild chronic small vessel ischemic disease.   CTA HEAD AND NECK  IMPRESSION:  1. Negative CTA for large vessel occlusion.  2. Short-segment 50% stenosis at the origin of the left common carotid artery.  3. Vascular stent in place across the right carotid bifurcation. Small focus of intraluminal soft plaque/thrombus with associated short-segment stenosis of up to approximately 40-50% by NASCET criteria. Flow within the stent otherwise widely patent.  4. 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 Right vertebral artery dominant.  5. Additional scattered atherosclerotic change elsewhere about the major arterial vasculature of the head and neck as above. No other hemodynamically significant or correctable stenosis.  6. Emphysema (ICD10-J43.9).   MR BRAIN WO CONTRAST 09/01/2020 IMPRESSION:  1. No acute intracranial infarct or other abnormality.  2. Chronic right MCA territory infarct, with additional remote lacunar infarct involving the right thalamus.  3. Underlying age-related cerebral atrophy with mild chronic small vessel ischemic  disease.   DG Chest Portable 1 View 08/31/2020 IMPRESSION:  No acute chest findings.    DG Shoulder Left 09/01/2020 IMPRESSION:  1. Acromioclavicular and glenohumeral degenerative change. No acute bony abnormality.  2. High-riding left shoulder consistent with rotator cuff tear.   DG Shoulder Right 09/01/2020 IMPRESSION: Moderate degenerative joint disease of the right acromioclavicular joint. No acute abnormality seen in the right shoulder.   CT Cervical Spine Wo Contrast 08/31/2020 IMPRESSION:  1. Lower cervical spondylosis.  No acute fracture.   CT RENAL STONE STUDY 09/01/2020 IMPRESSION:  1. No acute abnormality.  2. Horseshoe kidney.  3. 1.0 cm  rounded low density mass in the anterior aspect of the kidney on the left. This could represent a complicated cyst or solid mass. Further evaluation with pre and post contrast MRI should be considered. Pre and post contrast CT could alternatively be performed, but would likely be of decreased accuracy given lesion size.  4. 6 mm probable noncalcified gallstone in the gallbladder.  5. Mildly enlarged prostate gland.   Transthoracic Echocardiogram  1. Left ventricular ejection fraction, by estimation, is 65 to 70%. The left ventricle has normal function. The left ventricle has no regional wall motion abnormalities. Left ventricular diastolic parameters are consistent with Grade I diastolic dysfunction (impaired relaxation).  2. Right ventricular systolic function is normal. The right ventricular size is normal. There is normal pulmonary artery systolic pressure.  3. The mitral valve is normal in structure. No evidence of mitral valve regurgitation. No evidence of mitral stenosis.  4. The aortic valve is normal in structure. Aortic valve regurgitation is not visualized. Mild to moderate aortic valve sclerosis/calcification is present, without any evidence of aortic stenosis.  5. The inferior vena cava is normal in size with greater than 50% respiratory variability, suggesting right atrial pressure of 3 mmHg.   ECG - SR rate 100 BPM. (See cardiology reading for complete details)  EEG 01/17/20 EEG Abnormalities: None  Clinical Interpretation: This normal EEG is recorded in the waking and sleep state. There was no seizure or seizure predisposition recorded on this study. Please note that lack of epileptiform activity on EEG does not preclude the possibility of epilepsy.    PHYSICAL EXAM Blood pressure 117/61, pulse 86, temperature 97.8 F (36.6 C), temperature source Oral, resp. rate 20, SpO2 97 %.  General - Well nourished, well developed, lethargic.  Ophthalmologic - fundi not visualized  due to noncooperation.  Cardiovascular - Regular rhythm and rate.  Neuro - awake alert, lethargic, eyes open, still asking for chocolate milk. He is orientated to place and age but not to time. No aphasia, able to name 3/3 and repeat but mild to moderate dysarthria. PERRL, no visual field deficit, moving eyes bilaterally, no gaze palsy. No significant facial weakness, tongue midline. LUE 3-/5 proximal and 3/5 distal, RUE 4+/5. BLE 3/5 proximal and 3+/5 distally. Sensation symmetrical, FTN intact on the right although slow, but difficult to do with left. Gait not tested.     ASSESSMENT/PLAN Mr. Bryan Davies is a 69 y.o. male with history of stroke in March of 2020 (right MCA infarct 01/15/20 due to right ICA and MCA occlusions/p tPA and RI withright ICA stent resulting in TICI2breperfusion), as well as a PMHx of shingles, HTN, bipolar disorder and anxiety, presenting after he was found down at home with left sided weakness, slurred speech, and dehydrated with elevated CK. He did not receive IV t-PA due to recent stroke and unknown time of onset.  Recrudescence of old stroke symptoms in the setting of multiple medical conditions L sided weakness with chronic rotator cuff tear    CT head - No acute intracranial abnormality. Chronic right frontal lobe infarct  MRI head - No acute intracranial infarct or other abnormality. Chronic right MCA territory infarct, with additional remote lacunar infarct involving the right thalamus.   MRI repeat 11/5 - no acute stroke  CTA H&N - Negative CTA for large vessel occlusion. Short-segment 50% stenosis at the origin of the left common carotid artery. Small focus of intraluminal soft plaque/thrombus with associated short-segment stenosis of up to approximately 40-50% by NASCET criteria.   2D Echo EF 65-70%  Left shoulder X-ray - High-riding left shoulder consistent with rotator cuff tear.  Sars Corona Virus 2 - negative  LDL - 77  HgbA1c -  6.0  UDS - negative  VTE prophylaxis - Lovenox  aspirin 325 mg daily prior to admission, now on aspirin 325 mg daily. Continue on discharge.   Patient counseled to be compliant with his antithrombotic medications  Ongoing aggressive stroke risk factor management  Therapy recommendations: SNF  Disposition:  Pending  Dehydration/AKI/hypernatremia Hyperammonemia Transaminitis Leukocytosis  Na 149->147->138  Creatinine 1.3->1.4->1.42->1.13  Ammonium 62->37  AST/ALT 477/614 -> pending  WBC 19.4->18.4  Blood culture Staphylococus species, staph epidermidis, MR mecA/C  Depakote level 11 - does not feel related to high ammonia or elevated LFTs given low level - Depakote resumed  On IVF  MRI abdomen w/wo pending  History of stroke  12/2018, admitted for loss of consciousness, status post CPR, found to have right facial droop, left upper extremity weakness.  CT showed right MCA hyperdense.  CT head neck showed right ICA occlusion with decreased MCA flow.  CT perfusion positive for new penumbra.  Stat post IR with TICI2b reperfusion with right ICA stenting.  EEG negative.  MRI showed right MCA moderate infarct.  EF > 60%.  LDL 96, A1c 6.6.  Patient discharged with aspirin and Brilinta as well as Lipitor 40.  Hypertension  Home BP meds: Cozaar ; Norvasc  Blood pressure somewhat low  . 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 Continue ASA. Statin when LFTs normalized  Hospital day # 1  Neurology will sign off. Please call with questions. Pt will follow up with stroke clinic NP at [facility] in about 4 weeks. Thanks for the consult.   Marvel Plan, MD PhD Stroke Neurology 09/02/2020 9:31 AM   To contact Stroke Continuity provider, please refer to [email or web address]. After hours, contact General Neurology

## 2020-09-02 NOTE — Social Work (Signed)
To Whom It May Concern:  Please be advised that the above-named patient will require a short-term nursing home stay - anticipated 30 days or less for rehabilitation and strengthening.  The plan is for return home.  

## 2020-09-02 NOTE — Progress Notes (Signed)
Pt found with a rash on the left side of his chest. It is red and with small blister like dots and is not painful. Two on the chest and one on his L ear. Hospitalist notified and he came to assess pt. Borders marked.

## 2020-09-02 NOTE — TOC Initial Note (Signed)
Transition of Care [facility]) - Initial/Assessment Note    Patient Details  Name: Bryan Davies MRN: [id number] Date of Birth: [birth year]  Transition of Care [facility]) CM/SW Contact:    Carley Hammed, LCSWA Phone Number: 09/02/2020, 11:50 AM  Clinical Narrative:                 CSW spoke with PT and pt is agreeable to SNF placement. He noted no preference for facility and agreed to a Faxout. PT has not had Covid vaccines, but states he would be agreeable to receiving one. He asked that his girlfriend, Alcario Drought be contacted. She noted no preference for facility, but said that she was unable to care for him or make decisions for him due to her own illness. She provided the names and numbers for pt's siblings. Will speak with pt for consent to contact, girlfriend does not want to be the main contact. Will start insurance when DC date is noted. PASSR went to level 2, will upload paperwork.  Expected Discharge Plan: Skilled Nursing Facility Barriers to Discharge: Continued Medical Work up   Patient Goals and CMS Choice Patient states their goals for this hospitalization and ongoing recovery are:: Pt agreeable to SNF placement. CMS Medicare.gov Compare Post Acute Care list provided to:: Patient Choice offered to / list presented to : Patient  Expected Discharge Plan and Services Expected Discharge Plan: Skilled Nursing Facility In-house Referral: Clinical Social Work   Post Acute Care Choice: Skilled Nursing Facility Living arrangements for the past 2 months: Single Family Home                                      Prior Living Arrangements/Services Living arrangements for the past 2 months: Single Family Home Lives with:: Significant Other Patient language and need for interpreter reviewed:: Yes Do you feel safe going back to the place where you live?: Yes      Need for Family Participation in Patient Care: No (Comment) Care giver support system in place?: No (comment) Current home  services: DME Criminal Activity/Legal Involvement Pertinent to Current Situation/Hospitalization: No - Comment as needed  Activities of Daily Living      Permission Sought/Granted Permission sought to share information with : Family Supports Permission granted to share information with : Yes, Verbal Permission Granted  Share Information with NAME: Lynett Grimes     Permission granted to share info w Relationship: Girlfriend  Permission granted to share info w Contact Information: [phone]  Emotional Assessment Appearance:: Appears stated age Attitude/Demeanor/Rapport: Engaged Affect (typically observed): Pleasant Orientation: : Oriented to Self, Oriented to Place, Oriented to Situation, Oriented to  Time Alcohol / Substance Use: Not Applicable Psych Involvement: No (comment)  Admission diagnosis:  Pain [R52] Encephalopathy [G93.40] Left-sided weakness [R53.1] Fall, initial encounter [W19.XXXA] Patient Active Problem List   Diagnosis Date Noted  . ARF (acute renal failure) (HCC) 09/01/2020  . Elevated troponin 09/01/2020  . Elevated LFTs 09/01/2020  . Non-ST elevation (NSTEMI) myocardial infarction (HCC)   . Non-traumatic rhabdomyolysis   . Left-sided weakness 08/31/2020  . Bipolar 1 disorder, mixed, moderate (HCC) 06/08/2020  . Tardive dyskinesia 06/08/2020  . Bipolar 1 disorder (HCC) 05/27/2019  . 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 Ischemic stroke (HCC) 01/15/2019  . Left shoulder pain 12/05/2015  . Urinary hesitancy 12/05/2015  . Pain in lower jaw 07/11/2015  . Hyperlipidemia 02/11/2014  .  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 Dyshydrosis 07/26/2013  . 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 Hypertension 05/21/2012  . Post herpetic neuralgia 05/21/2012  . Bipolar disorder (HCC) 05/21/2012  . Tobacco abuse 05/21/2012   PCP:  [facility] Pharmacy:   [facility] [place], [place] - [address] [place] [address] Phone: [phone] Fax: [phone]     Social Determinants of Health (SDOH) Interventions    Readmission Risk Interventions No flowsheet data found.

## 2020-09-02 NOTE — Progress Notes (Signed)
PHARMACY - PHYSICIAN COMMUNICATION CRITICAL VALUE ALERT - BLOOD CULTURE IDENTIFICATION (BCID)  Bryan Davies is an 69 y.o. male who presented to [facility] on 08/31/2020 with a chief complaint of weakness/confusion.   Assessment:  WBC elevated  Name of physician (or Provider) Contacted: Dr. Leafy Half  Current antibiotics: None  Changes to prescribed antibiotics recommended:  Start vancomycin 750 mg IV q12h Vancomycin levels as needed Will need repeat blood cultures   Results for orders placed or performed during the hospital encounter of 08/31/20  Blood Culture ID Panel (Reflexed) (Collected: 09/01/2020  7:30 AM)  Result Value Ref Range   Enterococcus faecalis NOT DETECTED NOT DETECTED   Enterococcus Faecium NOT DETECTED NOT DETECTED   Listeria monocytogenes NOT DETECTED NOT DETECTED   Staphylococcus species DETECTED (A) NOT DETECTED   Staphylococcus aureus (BCID) NOT DETECTED NOT DETECTED   Staphylococcus epidermidis DETECTED (A) NOT DETECTED   Staphylococcus lugdunensis NOT DETECTED NOT DETECTED   Streptococcus species NOT DETECTED NOT DETECTED   Streptococcus agalactiae NOT DETECTED NOT DETECTED   Streptococcus pneumoniae NOT DETECTED NOT DETECTED   Streptococcus pyogenes NOT DETECTED NOT DETECTED   A.calcoaceticus-baumannii NOT DETECTED NOT DETECTED   Bacteroides fragilis NOT DETECTED NOT DETECTED   Enterobacterales NOT DETECTED NOT DETECTED   Enterobacter cloacae complex NOT DETECTED NOT DETECTED   Escherichia coli NOT DETECTED NOT DETECTED   Klebsiella aerogenes NOT DETECTED NOT DETECTED   Klebsiella oxytoca NOT DETECTED NOT DETECTED   Klebsiella pneumoniae NOT DETECTED NOT DETECTED   Proteus species NOT DETECTED NOT DETECTED   Salmonella species NOT DETECTED NOT DETECTED   Serratia marcescens NOT DETECTED NOT DETECTED   Haemophilus influenzae NOT DETECTED NOT DETECTED   Neisseria meningitidis NOT DETECTED NOT DETECTED   Pseudomonas aeruginosa NOT DETECTED NOT DETECTED    Stenotrophomonas maltophilia NOT DETECTED NOT DETECTED   Candida albicans NOT DETECTED NOT DETECTED   Candida auris NOT DETECTED NOT DETECTED   Candida glabrata NOT DETECTED NOT DETECTED   Candida krusei NOT DETECTED NOT DETECTED   Candida parapsilosis NOT DETECTED NOT DETECTED   Candida tropicalis NOT DETECTED NOT DETECTED   Cryptococcus neoformans/gattii NOT DETECTED NOT DETECTED   Methicillin resistance mecA/C DETECTED (A) NOT DETECTED    Abran Duke 09/02/2020  2:55 AM

## 2020-09-02 NOTE — TOC Initial Note (Deleted)
Transition of Care [facility]) - Initial/Assessment Note    Patient Details  Name: Bryan Davies MRN: [id number] Date of Birth: 1951/04/15  Transition of Care [facility]) CM/SW Contact:    Coralee Pesa, [place] Phone Number: 09/02/2020, 11:17 AM  Clinical Narrative:                 CSW met with pt and daughter Coralyn Mark at bedside. They are both agreeable to pt returning home with [facility] and daughter will provide 24 hour supervision. Daughter is pretty sure that they have a walker and 3 in 1 at home, but will check. Daughter requested an Therapist, sports and an aid as well as therapies. She noted that they had [employer] for him before, but she doesn't remember the company, possibly [employer] Pt has had Moderna Covid shots. PEG will not be placed until Monday, CM will make referrals then.SW will continue to follow.  Expected Discharge Plan: [place] Barriers to Discharge: Continued Medical Work up   Patient Goals and CMS Choice Patient states their goals for this hospitalization and ongoing recovery are:: Pt and daughter agreeable to return home with [facility]. CMS Medicare.gov Compare Post Acute Care list provided to:: Patient Choice offered to / list presented to : Patient  Expected Discharge Plan and Services Expected Discharge Plan: [place] In-house Referral: Clinical Social Work   Post Acute Care Choice: [place] arrangements for the past 2 months: [place]                                      Prior Living Arrangements/Services Living arrangements for the past 2 months: Single Family Home Lives with:: Significant Other Patient language and need for interpreter reviewed:: Yes Do you feel safe going back to the place where you live?: Yes      Need for Family Participation in Patient Care: Yes (Comment) Care giver support system in place?: Yes (comment) Current home services: DME Criminal Activity/Legal Involvement Pertinent to Current  Situation/Hospitalization: No - Comment as needed  Activities of Daily Living      Permission Sought/Granted Permission sought to share information with : Family Supports Permission granted to share information with : Yes, Verbal Permission Granted  Share Information with NAME: Rexanne Mano     Permission granted to share info w Relationship: Daughter  Permission granted to share info w Contact Information: [phone]  Emotional Assessment Appearance:: Appears stated age Attitude/Demeanor/Rapport: Engaged Affect (typically observed): Pleasant, Quiet Orientation: : Oriented to Self, Oriented to Place, Oriented to Situation Alcohol / Substance Use: Not Applicable Psych Involvement: No (comment)  Admission diagnosis:  Pain [R52] Encephalopathy [G93.40] Left-sided weakness [R53.1] Fall, initial encounter [W19.XXXA] Patient Active Problem List   Diagnosis Date Noted  . ARF (acute renal failure) ([place]) 09/01/2020  . Elevated troponin 09/01/2020  . Elevated LFTs 09/01/2020  . Non-ST elevation (NSTEMI) myocardial infarction ([place])   . Non-traumatic rhabdomyolysis   . Left-sided weakness 08/31/2020  . Bipolar 1 disorder, mixed, moderate ([place]) 06/08/2020  . Tardive dyskinesia 06/08/2020  . Bipolar 1 disorder ([place]) 05/27/2019  . 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 Ischemic stroke ([place]) 01/15/2019  . Left shoulder pain 12/05/2015  . Urinary hesitancy 12/05/2015  . Pain in lower jaw 07/11/2015  . Hyperlipidemia 02/11/2014  . 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 Dyshydrosis 07/26/2013  . Erectile dysfunction of organic origin  09/08/2012  . Hypertension 05/21/2012  . Post herpetic neuralgia 05/21/2012  . Bipolar disorder ([place]) 05/21/2012  . Tobacco abuse 05/21/2012   PCP:  [facility] Pharmacy:   [facility] [place] [address] Phone: [phone] Fax: [phone]     Social  Determinants of Health (SDOH) Interventions    Readmission Risk Interventions No flowsheet data found.

## 2020-09-02 NOTE — Progress Notes (Addendum)
Progress Note  Patient Name: Bryan Davies Date of Encounter: 09/02/2020  [facility] Cardiologist: Christell Constant, MD   Subjective   Soft spoken. Denies chest pain of palpitations.   Inpatient Medications    Scheduled Meds:  aspirin  300 mg Rectal Daily   Or   aspirin  325 mg Oral Daily   Chlorhexidine Gluconate Cloth  6 each Topical Daily   Deutetrabenazine  12 mg Oral BID   divalproex  750 mg Oral QHS   gabapentin  300 mg Oral BID   [START ON 09/07/2020] haloperidol decanoate  100 mg Intramuscular Q30 days   losartan  50 mg Oral Daily   nicotine  14 mg Transdermal Daily   [START ON 09/03/2020] tamsulosin  0.8 mg Oral QPC breakfast   Continuous Infusions:  sodium chloride 75 mL/hr at 09/02/20 1114   vancomycin 750 mg (09/02/20 0332)   PRN Meds: acetaminophen **OR** acetaminophen (TYLENOL) oral liquid 160 mg/5 mL **OR** acetaminophen, hydrALAZINE   Vital Signs    Vitals:   09/01/20 1541 09/01/20 1940 09/02/20 0014 09/02/20 0339  BP: 103/83 124/71 (!) 162/81 117/61  Pulse: (!) 104 (!) 101 97 86  Resp:  Temp: 98.6 F (37 C) 98.9 F (37.2 C) 97.6 F (36.4 C) 97.8 F (36.6 C)  TempSrc: Axillary Axillary  Oral  SpO2: 97% 98% 95% 97%    Intake/Output Summary (Last 24 hours) at 09/02/2020 1151 Last data filed at 09/02/2020 1054 Gross per 24 hour  Intake 5617.08 ml  Output 1200 ml  Net 4417.08 ml   Last 3 Weights 08/10/2020 05/18/2020 05/18/2020  Weight (lbs) 148 lb 149 lb 14.6 oz 150 lb  Weight (kg) 67.132 kg 68 kg 68.04 kg  Some encounter information is confidential and restricted. Go to Review Flowsheets activity to see all data.      Telemetry    Sinus rhythm / tachycardia  - Personally Reviewed  ECG    N/A  Physical Exam   GEN: No acute distress.   Neck: No JVD Cardiac: RRR, no murmurs, rubs, or gallops.  Respiratory: Clear to auscultation bilaterally. GI: Soft, nontender, non-distended  MS: No edema; No deformity. Neuro:   Left sided weakness Psych: soft spoken, lethergic  Labs    High Sensitivity Troponin:   Recent Labs  Lab 08/11/20 0100 08/31/20 2034 08/31/20 2238 09/01/20 0203 09/01/20 0412  TROPONINIHS 4 421* 407* 586* 569*      Chemistry Recent Labs  Lab 08/31/20 2034 08/31/20 2247 09/01/20 0203 09/01/20 1244 09/02/20 0157  NA 146*   < > 149* 147* 138  K 4.8   < > 3.9 3.9 3.6  CL 110   < > 111 110 105  CO2 19*   < > GLUCOSE 183*   < > 113* 171* 114*  BUN 80*   < > 76* 79* 62*  CREATININE 1.50*   < > 1.40* 1.42* 1.13  CALCIUM 9.4   < > 9.3 9.0 8.5*  PROT 7.2  --  6.8  --   --   ALBUMIN 3.7  --  3.6  --   --   AST 388*  --  477*  --   --   ALT 471*  --  614*  --   --   ALKPHOS 73  --  72  --   --   BILITOT 1.8*  --  1.7*  --   --   GFRNONAA 51*   < >  55* 54* >60  ANIONGAP 17*   < > 12 10 8    < > = values in this interval not displayed.     Hematology Recent Labs  Lab 08/31/20 2034 08/31/20 2247 09/01/20 0203  WBC 19.4*  --  18.4*  RBC 5.34  --  4.97  HGB 16.5 12.9* 15.3  HCT 48.8 38.0* 45.0  MCV 91.4  --  90.5  MCH 30.9  --  30.8  MCHC 33.8  --  34.0  RDW 12.9  --  12.9  PLT 212  --  204     Radiology    CT Angio Head W or Wo Contrast  Result Date: 08/31/2020 CLINICAL DATA:  Initial evaluation for possible stroke, left-sided deficits. EXAM: CT ANGIOGRAPHY HEAD AND NECK TECHNIQUE: Multidetector CT imaging of the head and neck was performed using the standard protocol during bolus administration of intravenous contrast. Multiplanar CT image reconstructions and MIPs were obtained to evaluate the vascular anatomy. Carotid stenosis measurements (when applicable) are obtained utilizing NASCET criteria, using the distal internal carotid diameter as the denominator. CONTRAST:  OMNIPAQUE IOHEXOL 350 MG/ML SOLN COMPARISON:  Prior study from 01/15/2019. FINDINGS: CT HEAD FINDINGS Brain: Generalized age-related cerebral atrophy with mild chronic small vessel  ischemic disease. Encephalomalacia involving the left frontal lobe compatible with chronic left MCA territory infarct. No acute intracranial hemorrhage. No acute large vessel territory infarct. No mass lesion, midline shift or mass effect. No hydrocephalus or extra-axial fluid collection. Vascular: No hyperdense vessel. Scattered vascular calcifications noted within the carotid siphons. Skull: Scalp soft tissues and calvarium within normal limits. Sinuses: Paranasal sinuses are largely clear. No significant mastoid effusion. Orbits: Globes and orbital soft tissues within normal limits. Review of the MIP images confirms the above findings CTA NECK FINDINGS Aortic arch: Visualized aortic arch of normal caliber with normal 3 vessel morphology. Moderate atheromatous change about the arch and origins of the great vessels without high-grade stenosis. Right carotid system: Right common carotid artery widely patent proximally. Eccentric calcified plaque at the distal right CCA with no more than mild stenosis. Vascular stent in place across the right carotid bifurcation. Small focus of intraluminal soft plaque/thrombus within the stent itself with short-segment 40-50% stenosis (series 8, image 165). Flow within the stent otherwise widely patent. Right ICA widely patent distally without stenosis, dissection or occlusion. Left carotid system: Calcified plaque at the origin of the left CCA with associated stenosis of up to 50% by NASCET criteria. Left CCA otherwise patent distally to the bifurcation without significant stenosis. Mild scattered atheromatous plaque about the left bifurcation without flow-limiting stenosis. Left ICA patent distally to the skull base without significant stenosis, dissection or occlusion. Vertebral arteries: Both vertebral arteries arise from the subclavian arteries. Right vertebral artery dominant, with a diffusely hypoplastic left vertebral artery. Atheromatous change at the origin of the  diminutive left vertebral artery with focal severe ostial stenosis (series 8, image 77). Additional scattered mild nonstenotic plaque within the vertebral arteries bilaterally without additional high-grade stenosis, dissection or occlusion. Skeleton: Better evaluated on concomitant CT of the cervical spine. Multilevel cervical spondylosis noted. No worrisome osseous lesions. Other neck: No other acute soft tissue abnormality within the neck. Upper chest: Visualized upper chest demonstrates no acute finding. Centrilobular and paraseptal emphysematous changes noted. Review of the MIP images confirms the above findings CTA HEAD FINDINGS Anterior circulation: Petrous segments patent bilaterally. Scattered atheromatous plaque within the carotid siphons without hemodynamically significant stenosis. A1 segments widely patent. Normal anterior communicating artery  complex. Anterior cerebral arteries patent to their distal aspects without stenosis. No M1 stenosis or occlusion. Normal MCA bifurcations. Distal MCA branches well perfused and symmetric. Posterior circulation: Both vertebral arteries patent to the vertebrobasilar junction without stenosis. Right PICA patent. Left PICA not seen. Basilar patent to its distal aspect without stenosis. Superior cerebellar and posterior cerebral arteries patent bilaterally. Venous sinuses: Grossly patent allowing for timing the contrast bolus. Anatomic variants: None significant.  No aneurysm. Review of the MIP images confirms the above findings IMPRESSION: CT HEAD IMPRESSION: 1. No acute intracranial abnormality. 2. Chronic right frontal lobe infarct, right MCA distribution. 3. Underlying age-related cerebral atrophy with mild chronic small vessel ischemic disease. CTA HEAD AND NECK IMPRESSION: 1. Negative CTA for large vessel occlusion. 2. Short-segment 50% stenosis at the origin of the left common carotid artery. 3. Vascular stent in place across the right carotid bifurcation. Small  focus of intraluminal soft plaque/thrombus with associated short-segment stenosis of up to approximately 40-50% by NASCET criteria. Flow within the stent otherwise widely patent. 4. 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 Right vertebral artery dominant. 5. Additional scattered atherosclerotic change elsewhere about the major arterial vasculature of the head and neck as above. No other hemodynamically significant or correctable stenosis. 6. Emphysema (ICD10-J43.9). Electronically Signed   By: Rise Mu M.D.   On: 08/31/2020 23:55   DG Shoulder Right  Result Date: 09/01/2020 CLINICAL DATA:  Unable to move right shoulder. EXAM: RIGHT SHOULDER - 2+ VIEW COMPARISON:  None. FINDINGS: There is no evidence of fracture or dislocation. Moderate degenerative changes seen involving the right acromioclavicular joint. Soft tissues are unremarkable. 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 No acute abnormality seen in the right shoulder. Electronically Signed   By: Lupita Raider M.D.   On: 09/01/2020 09:20   CT Angio Neck W and/or Wo Contrast  Result Date: 08/31/2020 CLINICAL DATA:  Initial evaluation for possible stroke, left-sided deficits. EXAM: CT ANGIOGRAPHY HEAD AND NECK TECHNIQUE: Multidetector CT imaging of the head and neck was performed using the standard protocol during bolus administration of intravenous contrast. Multiplanar CT image reconstructions and MIPs were obtained to evaluate the vascular anatomy. Carotid stenosis measurements (when applicable) are obtained utilizing NASCET criteria, using the distal internal carotid diameter as the denominator. CONTRAST:  OMNIPAQUE IOHEXOL 350 MG/ML SOLN COMPARISON:  Prior study from 01/15/2019. FINDINGS: CT HEAD FINDINGS Brain: Generalized age-related cerebral atrophy with mild chronic small vessel ischemic disease. Encephalomalacia involving the left frontal lobe  compatible with chronic left MCA territory infarct. No acute intracranial hemorrhage. No acute large vessel territory infarct. No mass lesion, midline shift or mass effect. No hydrocephalus or extra-axial fluid collection. Vascular: No hyperdense vessel. Scattered vascular calcifications noted within the carotid siphons. Skull: Scalp soft tissues and calvarium within normal limits. Sinuses: Paranasal sinuses are largely clear. No significant mastoid effusion. Orbits: Globes and orbital soft tissues within normal limits. Review of the MIP images confirms the above findings CTA NECK FINDINGS Aortic arch: Visualized aortic arch of normal caliber with normal 3 vessel morphology. Moderate atheromatous change about the arch and origins of the great vessels without high-grade stenosis. Right carotid system: Right common carotid artery widely patent proximally. Eccentric calcified plaque at the distal right CCA with no more than mild stenosis. Vascular stent in place across the right carotid bifurcation. Small focus of intraluminal soft plaque/thrombus within the stent itself with short-segment 40-50% stenosis (series 8, image 165). Flow  within the stent otherwise widely patent. Right ICA widely patent distally without stenosis, dissection or occlusion. Left carotid system: Calcified plaque at the origin of the left CCA with associated stenosis of up to 50% by NASCET criteria. Left CCA otherwise patent distally to the bifurcation without significant stenosis. Mild scattered atheromatous plaque about the left bifurcation without flow-limiting stenosis. Left ICA patent distally to the skull base without significant stenosis, dissection or occlusion. Vertebral arteries: Both vertebral arteries arise from the subclavian arteries. Right vertebral artery dominant, with a diffusely hypoplastic left vertebral artery. Atheromatous change at the origin of the diminutive left vertebral artery with focal severe ostial stenosis (series  8, image 77). Additional scattered mild nonstenotic plaque within the vertebral arteries bilaterally without additional high-grade stenosis, dissection or occlusion. Skeleton: Better evaluated on concomitant CT of the cervical spine. Multilevel cervical spondylosis noted. No worrisome osseous lesions. Other neck: No other acute soft tissue abnormality within the neck. Upper chest: Visualized upper chest demonstrates no acute finding. Centrilobular and paraseptal emphysematous changes noted. Review of the MIP images confirms the above findings CTA HEAD FINDINGS Anterior circulation: Petrous segments patent bilaterally. Scattered atheromatous plaque within the carotid siphons without hemodynamically significant stenosis. A1 segments widely patent. Normal anterior communicating artery complex. Anterior cerebral arteries patent to their distal aspects without stenosis. No M1 stenosis or occlusion. Normal MCA bifurcations. Distal MCA branches well perfused and symmetric. Posterior circulation: Both vertebral arteries patent to the vertebrobasilar junction without stenosis. Right PICA patent. Left PICA not seen. Basilar patent to its distal aspect without stenosis. Superior cerebellar and posterior cerebral arteries patent bilaterally. Venous sinuses: Grossly patent allowing for timing the contrast bolus. Anatomic variants: None significant.  No aneurysm. Review of the MIP images confirms the above findings IMPRESSION: CT HEAD IMPRESSION: 1. No acute intracranial abnormality. 2. Chronic right frontal lobe infarct, right MCA distribution. 3. Underlying age-related cerebral atrophy with mild chronic small vessel ischemic disease. CTA HEAD AND NECK IMPRESSION: 1. Negative CTA for large vessel occlusion. 2. Short-segment 50% stenosis at the origin of the left common carotid artery. 3. Vascular stent in place across the right carotid bifurcation. Small focus of intraluminal soft plaque/thrombus with associated short-segment  stenosis of up to approximately 40-50% by NASCET criteria. Flow within the stent otherwise widely patent. 4. 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 Right vertebral artery dominant. 5. Additional scattered atherosclerotic change elsewhere about the major arterial vasculature of the head and neck as above. No other hemodynamically significant or correctable stenosis. 6. Emphysema (ICD10-J43.9). Electronically Signed   By: Rise Mu M.D.   On: 08/31/2020 23:55   CT Cervical Spine Wo Contrast  Result Date: 08/31/2020 CLINICAL DATA:  Found down, weakness EXAM: CT CERVICAL SPINE WITHOUT CONTRAST TECHNIQUE: Multidetector CT imaging of the cervical spine was performed without intravenous contrast. Multiplanar CT image reconstructions were also generated. COMPARISON:  None. FINDINGS: Alignment: Alignment is anatomic. Skull base and vertebrae: No acute displaced fracture. Soft tissues and spinal canal: No prevertebral fluid or swelling. No visible canal hematoma. Stent is identified within the right carotid artery. Disc levels: Multilevel cervical spondylosis greatest at C4-5, C5-6, and C6-7. Symmetrical neural foraminal encroachment is seen at these levels. Upper chest: Airways patent. Emphysematous changes are seen at the lung apices. Other: Reconstructed images demonstrate no additional findings. IMPRESSION: 1. Lower cervical spondylosis.  No acute fracture. Electronically Signed   By: Sharlet Salina M.D.   On: 08/31/2020 22:51   MR BRAIN WO CONTRAST  Result Date: 09/01/2020 CLINICAL DATA:  Initial evaluation for neuro deficit, stroke suspected. Left-sided weakness. EXAM: MRI HEAD WITHOUT CONTRAST TECHNIQUE: Multiplanar, multiecho pulse sequences of the brain and surrounding structures were obtained without intravenous contrast. COMPARISON:  Prior CTA from 08/31/2020. FINDINGS: Brain: Diffuse prominence of the CSF containing spaces compatible generalized  age-related cerebral atrophy. Scattered patchy T2/FLAIR hyperintensity within the periventricular white matter most consistent with chronic small vessel ischemic disease, mild for age. Encephalomalacia and gliosis involving the right frontoparietal region consistent with a chronic right MCA territory infarct. Associated scattered chronic hemosiderin staining present within this region as well. Additional small remote lacunar infarct present at the right thalamus. No abnormal foci of restricted diffusion to suggest acute or subacute ischemia. Gray-white matter differentiation maintained. No other areas of remote cortical infarction. No evidence for acute intracranial hemorrhage. No mass lesion, midline shift or mass effect. No hydrocephalus or extra-axial fluid collection. Pituitary gland and suprasellar region within normal limits. Midline structures intact. Vascular: Major intracranial vascular flow voids are maintained. Skull and upper cervical spine: Craniocervical junction within normal limits. Bone marrow signal intensity normal. No scalp soft tissue abnormality. Sinuses/Orbits: Globes and orbital soft tissues within normal limits. Paranasal sinuses are largely clear. No significant mastoid effusion. Inner ear structures grossly normal. Other: None. IMPRESSION: 1. No acute intracranial infarct or other abnormality. 2. Chronic right MCA territory infarct, with additional remote lacunar infarct involving the right thalamus. 3. Underlying age-related cerebral atrophy with mild chronic small vessel ischemic disease. Electronically Signed   By: Rise Mu M.D.   On: 09/01/2020 03:49   DG Chest Portable 1 View  Result Date: 08/31/2020 CLINICAL DATA:  Altered mental status. Left-sided weakness and slurred speech. EXAM: PORTABLE CHEST 1 VIEW COMPARISON:  Radiograph 08/10/2020 FINDINGS: The cardiomediastinal contours are normal. Atherosclerosis of the thoracic aorta. Improved bronchial thickening from  prior exam. Chronic biapical pleuroparenchymal scarring. Pulmonary vasculature is normal. No consolidation, pleural effusion, or pneumothorax. No acute osseous abnormalities are seen. IMPRESSION: No acute chest findings. Electronically Signed   By: Narda Rutherford M.D.   On: 08/31/2020 21:08   DG Shoulder Left  Result Date: 09/01/2020 CLINICAL DATA:  Stroke symptoms. EXAM: LEFT SHOULDER - 2+ VIEW COMPARISON:  Prior study same day. FINDINGS: Acromioclavicular and glenohumeral degenerative change. No acute bony abnormality identified. No evidence of fracture or dislocation. High-riding left shoulder consistent with rotator cuff tear. IMPRESSION: 1. Acromioclavicular and glenohumeral degenerative change. No acute bony abnormality. 2. High-riding left shoulder consistent with rotator cuff tear. Electronically Signed   By: Maisie Fus  Register   On: 09/01/2020 09:20   ECHOCARDIOGRAM COMPLETE  Result Date: 09/01/2020    ECHOCARDIOGRAM REPORT   Patient Name:   Bryan Davies [facility] Date of Exam: 09/01/2020 Medical Rec #:  [id number]     Height:       67.0 in Accession #:    [id number]    Weight:       148.0 lb Date of Birth:  01/23/1951     BSA:          1.779 m Patient Age:    69 years      BP:           102/67 mmHg Patient Gender: M             HR:           95 bpm. Exam Location:  Inpatient Procedure: 2D Echo, Cardiac Doppler and Color Doppler Indications:    Elevated troponin  History:  Patient has prior history of Echocardiogram examinations, most                 recent 01/16/2019. Risk Factors:Hypertension, Dyslipidemia and                 Current Smoker. Elevated troponin.  Sonographer:    Renella Cunas RDCS Referring Phys: [id number] Meryle Ready [facility]  Sonographer Comments: Patient unable to turn on side. IMPRESSIONS  1. Left ventricular ejection fraction, by estimation, is 65 to 70%. The left ventricle has normal function. The left ventricle has no regional wall motion abnormalities. Left ventricular diastolic  parameters are consistent with Grade I diastolic dysfunction (impaired relaxation).  2. Right ventricular systolic function is normal. The right ventricular size is normal. There is normal pulmonary artery systolic pressure.  3. The mitral valve is normal in structure. No evidence of mitral valve regurgitation. No evidence of mitral stenosis.  4. The aortic valve is normal in structure. Aortic valve regurgitation is not visualized. Mild to moderate aortic valve sclerosis/calcification is present, without any evidence of aortic stenosis.  5. The inferior vena cava is normal in size with greater than 50% respiratory variability, suggesting right atrial pressure of 3 mmHg. FINDINGS  Left Ventricle: Left ventricular ejection fraction, by estimation, is 65 to 70%. The left ventricle has normal function. The left ventricle has no regional wall motion abnormalities. The left ventricular internal cavity size was normal in size. There is  no left ventricular hypertrophy. Left ventricular diastolic parameters are consistent with Grade I diastolic dysfunction (impaired relaxation). Normal left ventricular filling pressure. Right Ventricle: The right ventricular size is normal. No increase in right ventricular wall thickness. Right ventricular systolic function is normal. There is normal pulmonary artery systolic pressure. Left Atrium: Left atrial size was normal in size. Right Atrium: Right atrial size was normal in size. Pericardium: There is no evidence of pericardial effusion. Mitral Valve: The mitral valve is normal in structure. No evidence of mitral valve regurgitation. No evidence of mitral valve stenosis. Tricuspid Valve: The tricuspid valve is normal in structure. Tricuspid valve regurgitation is mild . No evidence of tricuspid stenosis. Aortic Valve: The aortic valve is normal in structure. Aortic valve regurgitation is not visualized. Mild to moderate aortic valve sclerosis/calcification is present, without any  evidence of aortic stenosis. Pulmonic Valve: The pulmonic valve was normal in structure. Pulmonic valve regurgitation is not visualized. No evidence of pulmonic stenosis. Aorta: The aortic root is normal in size and structure. Venous: The inferior vena cava is normal in size with greater than 50% respiratory variability, suggesting right atrial pressure of 3 mmHg. IAS/Shunts: No atrial level shunt detected by color flow Doppler.  LEFT VENTRICLE PLAX 2D LVIDd:         4.00 cm     Diastology LVIDs:         3.00 cm     LV e' medial:    4.38 cm/s LV PW:         0.70 cm     LV E/e' medial:  11.1 LV IVS:        0.70 cm     LV e' lateral:   5.26 cm/s LVOT diam:     2.00 cm     LV E/e' lateral: 9.3 LV SV:         65 LV SV Index:   37 LVOT Area:     3.14 cm  LV Volumes (MOD) LV vol d, MOD A4C: 51.1 ml LV vol  s, MOD A4C: 25.9 ml LV SV MOD A4C:     51.1 ml RIGHT VENTRICLE RV S prime:     9.73 cm/s TAPSE (M-mode): 1.7 cm LEFT ATRIUM           Index      RIGHT ATRIUM          Index LA diam:      2.30 cm 1.29 cm/m RA Area:     6.27 cm LA Vol (A2C): 13.1 ml 7.36 ml/m RA Volume:   9.92 ml  5.58 ml/m LA Vol (A4C): 11.4 ml 6.41 ml/m  AORTIC VALVE LVOT Vmax:   123.00 cm/s LVOT Vmean:  82.300 cm/s LVOT VTI:    0.207 m  AORTA Ao Root diam: 2.90 cm MITRAL VALVE MV Area (PHT): 3.27 cm    SHUNTS MV Decel Time: 232 msec    Systemic VTI:  0.21 m MV E velocity: 48.80 cm/s  Systemic Diam: 2.00 cm MV A velocity: 67.70 cm/s MV E/A ratio:  0.72 Tobias AlexanderKatarina Nelson MD Electronically signed by Tobias AlexanderKatarina Nelson MD Signature Date/Time: 09/01/2020/2:21:14 PM    Final    CT RENAL STONE STUDY  Result Date: 09/01/2020 CLINICAL DATA:  Flank pain. EXAM: CT ABDOMEN AND PELVIS WITHOUT CONTRAST TECHNIQUE: Multidetector CT imaging of the abdomen and pelvis was performed following the standard protocol without IV contrast. COMPARISON:  Abdomen and pelvis radiographs dated 08/15/2009. FINDINGS: Lower chest: Unremarkable. Hepatobiliary: 6 mm probable  noncalcified gallstone in the gallbladder. No gallbladder wall thickening or pericholecystic fluid. Unremarkable liver. Pancreas: Unremarkable. No pancreatic ductal dilatation or surrounding inflammatory changes. Spleen: Normal in size without focal abnormality. Adrenals/Urinary Tract: Normal appearing adrenal glands. Horseshoe kidney. 1.0 cm rounded low density mass in the anterior aspect of the kidney on the left. This measures 51 Hounsfield units in density on image number 36 series 3. No urinary tract calculi or hydronephrosis. Stomach/Bowel: Unremarkable stomach, small bowel and colon. No evidence of appendicitis. Vascular/Lymphatic: Atheromatous arterial calcifications without aneurysm. No enlarged lymph nodes. Reproductive: Mildly enlarged prostate gland. Other: No abdominal wall hernia or abnormality. No abdominopelvic ascites. Musculoskeletal: Lumbar and lower thoracic spine degenerative changes. IMPRESSION: 1. No acute abnormality. 2. Horseshoe kidney. 3. 1.0 cm rounded low density mass in the anterior aspect of the kidney on the left. This could represent a complicated cyst or solid mass. Further evaluation with pre and post contrast MRI should be considered. Pre and post contrast CT could alternatively be performed, but would likely be of decreased accuracy given lesion size. 4. 6 mm probable noncalcified gallstone in the gallbladder. 5. Mildly enlarged prostate gland. Electronically Signed   By: Beckie SaltsSteven  Reid M.D.   On: 09/01/2020 08:32    Cardiac Studies   Echo 09/01/2020  1. Left ventricular ejection fraction, by estimation, is 65 to 70%. The  left ventricle has normal function. The left ventricle has no regional  wall motion abnormalities. Left ventricular diastolic parameters are  consistent with Grade I diastolic  dysfunction (impaired relaxation).   2. Right ventricular systolic function is normal. The right ventricular  size is normal. There is normal pulmonary artery systolic pressure.    3. The mitral valve is normal in structure. No evidence of mitral valve  regurgitation. No evidence of mitral stenosis.   4. The aortic valve is normal in structure. Aortic valve regurgitation is  not visualized. Mild to moderate aortic valve sclerosis/calcification is  present, without any evidence of aortic stenosis.   5. The inferior vena cava is normal in  size with greater than 50%  respiratory variability, suggesting right atrial pressure of 3 mmHg.   Patient Profile     69 y.o. male with a PMH of HTN, HLD, CVA 12/2018, carotid artery disease s/p carotid artery stenting, bipolar disorder, tobacco abuse seen for elevated troponin.   Assessment & Plan    1. Elevated troponin after being down for (>24 hours presumably)  - No chest pain  - Presentation consistent with demand ischemia in the setting of rhabdomyolysis.  - Cardiac risk factors includes HTN, HLD, carotid artery disease, longstanding tobacco abuse and pre-DM - He could have underlying CAD. Likely outpatient non invasive eval - Continue ASA - Could consider statin when stable  2. Tachycardia - Intermittent sinus tachy in 110-130s - No evidence of afib/flutter - could consider BB when stable BP (on Losartan currently)  Otherwise per primary team       For questions or updates, please contact [facility] Please consult [email or web address] for contact info under        SignedManson Passey, PA  09/02/2020, 11:51 AM    Personally seen and examined. Agree with APP above with the following comments: Briefly asymptomatic troponin elevation in the setting of being found down.  Troponin down trending.   Patient notes that he would like chocolate milk Exam notable for weakness in all-extremities Would recommend outpatient non-invasive stress testing after recovery from rhabo; patient has risk factors for CAD. ASA, Statin when CK resolves. Discussed smoking cessation (pre-contemplation). Will sign off at this  time; happy to re-engage as new issues arise.  Christell Constant, MD

## 2020-09-02 NOTE — Progress Notes (Signed)
HOSPITAL MEDICINE OVERNIGHT EVENT NOTE    Notified by pharmacy that 3 out of 4 bottles of blood culture bottles are positive for staph epidermidis.  Chart reviewed, patient has had a leukocytosis with past 2 days.  Patient presented with left-sided weakness and has also been found to be encephalopathic.  Patient will be initiated on intravenous vancomycin therapy this morning.  Repeat blood cultures have been ordered 48 hours after initiation of antibiotic.   Marinda Elk  MD [facility]

## 2020-09-02 NOTE — Progress Notes (Signed)
  Speech Language Pathology Treatment: Dysphagia;Cognitive-Linquistic  Patient Details Name: Bryan Davies MRN: [id number] DOB: 1951-01-10 Today's Date: 09/02/2020 Time: 3785-8850 SLP Time Calculation (min) (ACUTE ONLY): 23 min  Assessment / Plan / Recommendation Clinical Impression  Pt was in bed, awake but lethargic upon therapist's arrival.  He was agreeable to participating in treatment.  Pt consumed dys 3 textures and thin liquids with x1 immediate cough following a large sip of liquids.  No other overt s/s of aspiration were evident with solids or liquids and pt remains afebrile, O2 sats WFL on room air, and lungs documented to be clear by RN on bedside assessment.  As a result, I recommend that pt remain on his currently prescribed diet with intermittent supervision for use of swallowing precautions (limit distractions, small bites and sips, upright for all PO intake).  SLP will continue to follow up for ongoing trials of upgraded textures as pt is able to tolerate.  Pt was oriented x4 during today's therapy session and his responses to questions were usually appropriate and timely although he had 1-2 instances where his answers to questions became tangential.  When asked about his current limitations, pt initially said "everything" but was then able to mention speech and mobility as specific areas where he is experiencing challenges.  Pt was left in bed with bed alarm set and call bell within reach.  Continue per current plan of care.    HPI HPI: 69 y.o. male with history of stroke and March 2020 status post carotid stent placement with history of hypertension, COPD, ongoing tobacco abuse and bipolar disorder was brought to the ER after patient was found to be on the floor confused and weak on the left side as noticed by patient's girlfriend. MRI (-) for acute  CVA; chronic R MCA infarct with additional remote lacumar infarct incolving R thalamusl age  - related cerebral atrophy.  AKI in setting on  rhabdomyolysis; elevated troponins; acute encephalopathy.      SLP Plan  Continue with current plan of care       Recommendations  Diet recommendations: Dysphagia 3 (mechanical soft);Thin liquid Liquids provided via: Cup;Straw Medication Administration: Whole meds with puree Supervision: Intermittent supervision to cue for compensatory strategies;Patient able to self feed Compensations: Minimize environmental distractions;Slow rate;Small sips/bites Postural Changes and/or Swallow Maneuvers: Seated upright 90 degrees;Upright 30-60 min after meal                Oral Care Recommendations: Oral care BID Follow up Recommendations: Skilled Nursing facility;24 hour supervision/assistance;Home health SLP SLP Visit Diagnosis: Dysphagia, unspecified (R13.10);Cognitive communication deficit (R41.841) Plan: Continue with current plan of care       GO                Adrina Armijo, Melanee Spry 09/02/2020, 10:27 AM

## 2020-09-02 NOTE — Progress Notes (Signed)
[facility] PROGRESS NOTE    Progress Note  TREYVON BLAHUT  ERX:[id number] DOB: 1951-10-11 DOA: 08/31/2020 PCP: Gean Birchwood, [facility]     Brief Narrative:   Bryan Davies is an 69 y.o. male past medical history of stroke in 01/16/2019 status post carotid stenting, essential hypertension ongoing tobacco abuse bipolar disorder brought into the ED as he was found in the floor confused and weak on the left side by his girlfriend  Assessment/Plan:   Left-sided weakness dueto a rotator cuff tear: HgbA1c 6.0, fasting lipid panel HDL greater than 40 LDL less than 100 MRI, MRA of the brain without contrast MRI of the brain showed no acute infarcts, I did show chronic right-sided MCA infarct with additional remote lacunar infarcts to the right thalamus. PT, OT valuated the patient recommended skilled nursing facility. Continue aspirin Continue to hold statin due to rhabdo. We will be more aggressive with pressure control.  BP Is improved. Blood  reflex panel ID shows staph epididymitis likely a contaminant blood cultures are pending. Will call ortho.  Acute kidney injury/in the setting of nontraumatic rhabdomyolysis: Baseline creatinine of around 1, on admission 1.4,we will start him on aggressive IV fluid hydration He has returned to baseline likely prerenal azotemia we will continue normal saline at 75 as a CK is 3000. Recheck a CK and basic metabolic panel in the morning. CT of the kidney showed a 1 cm cystic or solid mass we will get an MRI of the kidneys to further evaluate.  Elevated troponins: Twelve-lead EKG showed some ST segment elevation V2 through V4. Likely due to rhabdomyolysis.  Elevated LFTs: Likely due to rhabdomyolysis. Hepatitis panel is negative HIV is nonreactive.  Acute encephalopathy: His ammonia level was elevated, has no history of liver disease. Depakote level is 11, resume depakote  History of bipolar disorder: There is a concern about an interaction  between Depakote and his ammonia as it is elevated and he is currently on Depakote has no history of cirrhosis. Getting a Depakote level and repeating ammonia level.  History of essential hypertension: On IV hydralazine  BPH: On CT scan there that showed a enlarged prostate, insert a Foley start on Flomax  Leukocytosis: Likely reactive has remained afebrile blood cultures have been sent.   New left rotator cuff tear: We will consult orthopedic surgery  DVT prophylaxis: lovenxo Family Communication:none Status is: Inpatient  Remains inpatient appropriate because:Hemodynamically unstable   Dispo: The patient is from: Home              Anticipated d/c is to: SNF              Anticipated d/c date is: > 3 days              Patient currently is not medically stable to d/c.        Code Status:     Code Status Orders  (From admission, onward)         Start     Ordered   09/01/20 0204  Full code  Continuous        09/01/20 0206        Code Status History    Date Active Date Inactive Code Status Order ID Comments User Context   05/27/2019 1621 06/01/2019 1600 Full Code [id number]  Talmage Nap, NP Inpatient   05/26/2019 2356 05/27/2019 1537 Full Code [id number]  Ward, Layla Maw, DO ED   05/03/2019 0250 05/04/2019 2026 Full Code [id number]  Dione Booze,  MD ED   01/15/2019 2115 01/18/2019 1754 Full Code [id number]  Aroor, Dara Lords, MD ED   Advance Care Planning Activity        IV Access:    Peripheral IV   Procedures and diagnostic studies:   CT Angio Head W or Wo Contrast  Result Date: 08/31/2020 CLINICAL DATA:  Initial evaluation for possible stroke, left-sided deficits. EXAM: CT ANGIOGRAPHY HEAD AND NECK TECHNIQUE: Multidetector CT imaging of the head and neck was performed using the standard protocol during bolus administration of intravenous contrast. Multiplanar CT image reconstructions and MIPs were obtained to evaluate the vascular anatomy. Carotid stenosis  measurements (when applicable) are obtained utilizing NASCET criteria, using the distal internal carotid diameter as the denominator. CONTRAST:  OMNIPAQUE IOHEXOL 350 MG/ML SOLN COMPARISON:  Prior study from 01/15/2019. FINDINGS: CT HEAD FINDINGS Brain: Generalized age-related cerebral atrophy with mild chronic small vessel ischemic disease. Encephalomalacia involving the left frontal lobe compatible with chronic left MCA territory infarct. No acute intracranial hemorrhage. No acute large vessel territory infarct. No mass lesion, midline shift or mass effect. No hydrocephalus or extra-axial fluid collection. Vascular: No hyperdense vessel. Scattered vascular calcifications noted within the carotid siphons. Skull: Scalp soft tissues and calvarium within normal limits. Sinuses: Paranasal sinuses are largely clear. No significant mastoid effusion. Orbits: Globes and orbital soft tissues within normal limits. Review of the MIP images confirms the above findings CTA NECK FINDINGS Aortic arch: Visualized aortic arch of normal caliber with normal 3 vessel morphology. Moderate atheromatous change about the arch and origins of the great vessels without high-grade stenosis. Right carotid system: Right common carotid artery widely patent proximally. Eccentric calcified plaque at the distal right CCA with no more than mild stenosis. Vascular stent in place across the right carotid bifurcation. Small focus of intraluminal soft plaque/thrombus within the stent itself with short-segment 40-50% stenosis (series 8, image 165). Flow within the stent otherwise widely patent. Right ICA widely patent distally without stenosis, dissection or occlusion. Left carotid system: Calcified plaque at the origin of the left CCA with associated stenosis of up to 50% by NASCET criteria. Left CCA otherwise patent distally to the bifurcation without significant stenosis. Mild scattered atheromatous plaque about the left bifurcation without  flow-limiting stenosis. Left ICA patent distally to the skull base without significant stenosis, dissection or occlusion. Vertebral arteries: Both vertebral arteries arise from the subclavian arteries. Right vertebral artery dominant, with a diffusely hypoplastic left vertebral artery. Atheromatous change at the origin of the diminutive left vertebral artery with focal severe ostial stenosis (series 8, image 77). Additional scattered mild nonstenotic plaque within the vertebral arteries bilaterally without additional high-grade stenosis, dissection or occlusion. Skeleton: Better evaluated on concomitant CT of the cervical spine. Multilevel cervical spondylosis noted. No worrisome osseous lesions. Other neck: No other acute soft tissue abnormality within the neck. Upper chest: Visualized upper chest demonstrates no acute finding. Centrilobular and paraseptal emphysematous changes noted. Review of the MIP images confirms the above findings CTA HEAD FINDINGS Anterior circulation: Petrous segments patent bilaterally. Scattered atheromatous plaque within the carotid siphons without hemodynamically significant stenosis. A1 segments widely patent. Normal anterior communicating artery complex. Anterior cerebral arteries patent to their distal aspects without stenosis. No M1 stenosis or occlusion. Normal MCA bifurcations. Distal MCA branches well perfused and symmetric. Posterior circulation: Both vertebral arteries patent to the vertebrobasilar junction without stenosis. Right PICA patent. Left PICA not seen. Basilar patent to its distal aspect without stenosis. Superior cerebellar and posterior cerebral arteries patent bilaterally.  Venous sinuses: Grossly patent allowing for timing the contrast bolus. Anatomic variants: None significant.  No aneurysm. Review of the MIP images confirms the above findings IMPRESSION: CT HEAD IMPRESSION: 1. No acute intracranial abnormality. 2. Chronic right frontal lobe infarct, right MCA  distribution. 3. Underlying age-related cerebral atrophy with mild chronic small vessel ischemic disease. CTA HEAD AND NECK IMPRESSION: 1. Negative CTA for large vessel occlusion. 2. Short-segment 50% stenosis at the origin of the left common carotid artery. 3. Vascular stent in place across the right carotid bifurcation. Small focus of intraluminal soft plaque/thrombus with associated short-segment stenosis of up to approximately 40-50% by NASCET criteria. Flow within the stent otherwise widely patent. 4. 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 Right vertebral artery dominant. 5. Additional scattered atherosclerotic change elsewhere about the major arterial vasculature of the head and neck as above. No other hemodynamically significant or correctable stenosis. 6. Emphysema (ICD10-J43.9). Electronically Signed   By: Rise Mu M.D.   On: 08/31/2020 23:55   DG Shoulder Right  Result Date: 09/01/2020 CLINICAL DATA:  Unable to move right shoulder. EXAM: RIGHT SHOULDER - 2+ VIEW COMPARISON:  None. FINDINGS: There is no evidence of fracture or dislocation. Moderate degenerative changes seen involving the right acromioclavicular joint. Soft tissues are unremarkable. 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 No acute abnormality seen in the right shoulder. Electronically Signed   By: Lupita Raider M.D.   On: 09/01/2020 09:20   CT Angio Neck W and/or Wo Contrast  Result Date: 08/31/2020 CLINICAL DATA:  Initial evaluation for possible stroke, left-sided deficits. EXAM: CT ANGIOGRAPHY HEAD AND NECK TECHNIQUE: Multidetector CT imaging of the head and neck was performed using the standard protocol during bolus administration of intravenous contrast. Multiplanar CT image reconstructions and MIPs were obtained to evaluate the vascular anatomy. Carotid stenosis measurements (when applicable) are obtained utilizing NASCET  criteria, using the distal internal carotid diameter as the denominator. CONTRAST:  OMNIPAQUE IOHEXOL 350 MG/ML SOLN COMPARISON:  Prior study from 01/15/2019. FINDINGS: CT HEAD FINDINGS Brain: Generalized age-related cerebral atrophy with mild chronic small vessel ischemic disease. Encephalomalacia involving the left frontal lobe compatible with chronic left MCA territory infarct. No acute intracranial hemorrhage. No acute large vessel territory infarct. No mass lesion, midline shift or mass effect. No hydrocephalus or extra-axial fluid collection. Vascular: No hyperdense vessel. Scattered vascular calcifications noted within the carotid siphons. Skull: Scalp soft tissues and calvarium within normal limits. Sinuses: Paranasal sinuses are largely clear. No significant mastoid effusion. Orbits: Globes and orbital soft tissues within normal limits. Review of the MIP images confirms the above findings CTA NECK FINDINGS Aortic arch: Visualized aortic arch of normal caliber with normal 3 vessel morphology. Moderate atheromatous change about the arch and origins of the great vessels without high-grade stenosis. Right carotid system: Right common carotid artery widely patent proximally. Eccentric calcified plaque at the distal right CCA with no more than mild stenosis. Vascular stent in place across the right carotid bifurcation. Small focus of intraluminal soft plaque/thrombus within the stent itself with short-segment 40-50% stenosis (series 8, image 165). Flow within the stent otherwise widely patent. Right ICA widely patent distally without stenosis, dissection or occlusion. Left carotid system: Calcified plaque at the origin of the left CCA with associated stenosis of up to 50% by NASCET criteria. Left CCA otherwise patent distally to the bifurcation without significant stenosis. Mild scattered atheromatous plaque about the left bifurcation without flow-limiting stenosis. Left  ICA patent distally to the skull  base without significant stenosis, dissection or occlusion. Vertebral arteries: Both vertebral arteries arise from the subclavian arteries. Right vertebral artery dominant, with a diffusely hypoplastic left vertebral artery. Atheromatous change at the origin of the diminutive left vertebral artery with focal severe ostial stenosis (series 8, image 77). Additional scattered mild nonstenotic plaque within the vertebral arteries bilaterally without additional high-grade stenosis, dissection or occlusion. Skeleton: Better evaluated on concomitant CT of the cervical spine. Multilevel cervical spondylosis noted. No worrisome osseous lesions. Other neck: No other acute soft tissue abnormality within the neck. Upper chest: Visualized upper chest demonstrates no acute finding. Centrilobular and paraseptal emphysematous changes noted. Review of the MIP images confirms the above findings CTA HEAD FINDINGS Anterior circulation: Petrous segments patent bilaterally. Scattered atheromatous plaque within the carotid siphons without hemodynamically significant stenosis. A1 segments widely patent. Normal anterior communicating artery complex. Anterior cerebral arteries patent to their distal aspects without stenosis. No M1 stenosis or occlusion. Normal MCA bifurcations. Distal MCA branches well perfused and symmetric. Posterior circulation: Both vertebral arteries patent to the vertebrobasilar junction without stenosis. Right PICA patent. Left PICA not seen. Basilar patent to its distal aspect without stenosis. Superior cerebellar and posterior cerebral arteries patent bilaterally. Venous sinuses: Grossly patent allowing for timing the contrast bolus. Anatomic variants: None significant.  No aneurysm. Review of the MIP images confirms the above findings IMPRESSION: CT HEAD IMPRESSION: 1. No acute intracranial abnormality. 2. Chronic right frontal lobe infarct, right MCA distribution. 3. Underlying age-related cerebral atrophy with  mild chronic small vessel ischemic disease. CTA HEAD AND NECK IMPRESSION: 1. Negative CTA for large vessel occlusion. 2. Short-segment 50% stenosis at the origin of the left common carotid artery. 3. Vascular stent in place across the right carotid bifurcation. Small focus of intraluminal soft plaque/thrombus with associated short-segment stenosis of up to approximately 40-50% by NASCET criteria. Flow within the stent otherwise widely patent. 4. 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 Right vertebral artery dominant. 5. Additional scattered atherosclerotic change elsewhere about the major arterial vasculature of the head and neck as above. No other hemodynamically significant or correctable stenosis. 6. Emphysema (ICD10-J43.9). Electronically Signed   By: Rise Mu M.D.   On: 08/31/2020 23:55   CT Cervical Spine Wo Contrast  Result Date: 08/31/2020 CLINICAL DATA:  Found down, weakness EXAM: CT CERVICAL SPINE WITHOUT CONTRAST TECHNIQUE: Multidetector CT imaging of the cervical spine was performed without intravenous contrast. Multiplanar CT image reconstructions were also generated. COMPARISON:  None. FINDINGS: Alignment: Alignment is anatomic. Skull base and vertebrae: No acute displaced fracture. Soft tissues and spinal canal: No prevertebral fluid or swelling. No visible canal hematoma. Stent is identified within the right carotid artery. Disc levels: Multilevel cervical spondylosis greatest at C4-5, C5-6, and C6-7. Symmetrical neural foraminal encroachment is seen at these levels. Upper chest: Airways patent. Emphysematous changes are seen at the lung apices. Other: Reconstructed images demonstrate no additional findings. IMPRESSION: 1. Lower cervical spondylosis.  No acute fracture. Electronically Signed   By: Sharlet Salina M.D.   On: 08/31/2020 22:51   MR BRAIN WO CONTRAST  Result Date: 09/01/2020 CLINICAL DATA:  Initial evaluation for neuro deficit,  stroke suspected. Left-sided weakness. EXAM: MRI HEAD WITHOUT CONTRAST TECHNIQUE: Multiplanar, multiecho pulse sequences of the brain and surrounding structures were obtained without intravenous contrast. COMPARISON:  Prior CTA from 08/31/2020. FINDINGS: Brain: Diffuse prominence of the CSF containing spaces compatible generalized age-related cerebral atrophy. Scattered patchy T2/FLAIR  hyperintensity within the periventricular white matter most consistent with chronic small vessel ischemic disease, mild for age. Encephalomalacia and gliosis involving the right frontoparietal region consistent with a chronic right MCA territory infarct. Associated scattered chronic hemosiderin staining present within this region as well. Additional small remote lacunar infarct present at the right thalamus. No abnormal foci of restricted diffusion to suggest acute or subacute ischemia. Gray-white matter differentiation maintained. No other areas of remote cortical infarction. No evidence for acute intracranial hemorrhage. No mass lesion, midline shift or mass effect. No hydrocephalus or extra-axial fluid collection. Pituitary gland and suprasellar region within normal limits. Midline structures intact. Vascular: Major intracranial vascular flow voids are maintained. Skull and upper cervical spine: Craniocervical junction within normal limits. Bone marrow signal intensity normal. No scalp soft tissue abnormality. Sinuses/Orbits: Globes and orbital soft tissues within normal limits. Paranasal sinuses are largely clear. No significant mastoid effusion. Inner ear structures grossly normal. Other: None. IMPRESSION: 1. No acute intracranial infarct or other abnormality. 2. Chronic right MCA territory infarct, with additional remote lacunar infarct involving the right thalamus. 3. Underlying age-related cerebral atrophy with mild chronic small vessel ischemic disease. Electronically Signed   By: Rise Mu M.D.   On: 09/01/2020  03:49   DG Chest Portable 1 View  Result Date: 08/31/2020 CLINICAL DATA:  Altered mental status. Left-sided weakness and slurred speech. EXAM: PORTABLE CHEST 1 VIEW COMPARISON:  Radiograph 08/10/2020 FINDINGS: The cardiomediastinal contours are normal. Atherosclerosis of the thoracic aorta. Improved bronchial thickening from prior exam. Chronic biapical pleuroparenchymal scarring. Pulmonary vasculature is normal. No consolidation, pleural effusion, or pneumothorax. No acute osseous abnormalities are seen. IMPRESSION: No acute chest findings. Electronically Signed   By: Narda Rutherford M.D.   On: 08/31/2020 21:08   DG Shoulder Left  Result Date: 09/01/2020 CLINICAL DATA:  Stroke symptoms. EXAM: LEFT SHOULDER - 2+ VIEW COMPARISON:  Prior study same day. FINDINGS: Acromioclavicular and glenohumeral degenerative change. No acute bony abnormality identified. No evidence of fracture or dislocation. High-riding left shoulder consistent with rotator cuff tear. IMPRESSION: 1. Acromioclavicular and glenohumeral degenerative change. No acute bony abnormality. 2. High-riding left shoulder consistent with rotator cuff tear. Electronically Signed   By: Maisie Fus  Register   On: 09/01/2020 09:20   ECHOCARDIOGRAM COMPLETE  Result Date: 09/01/2020    ECHOCARDIOGRAM REPORT   Patient Name:   GURTEJ NOYOLA [facility] Date of Exam: 09/01/2020 Medical Rec #:  [id number]     Height:       67.0 in Accession #:    [id number]    Weight:       148.0 lb Date of Birth:  12-25-1950     BSA:          1.779 m Patient Age:    69 years      BP:           102/67 mmHg Patient Gender: M             HR:           95 bpm. Exam Location:  Inpatient Procedure: 2D Echo, Cardiac Doppler and Color Doppler Indications:    Elevated troponin  History:        Patient has prior history of Echocardiogram examinations, most                 recent 01/16/2019. Risk Factors:Hypertension, Dyslipidemia and                 Current Smoker. Elevated troponin.  Sonographer:  Renella Cunas RDCS Referring Phys: [id number] Meryle Ready [facility]  Sonographer Comments: Patient unable to turn on side. IMPRESSIONS  1. Left ventricular ejection fraction, by estimation, is 65 to 70%. The left ventricle has normal function. The left ventricle has no regional wall motion abnormalities. Left ventricular diastolic parameters are consistent with Grade I diastolic dysfunction (impaired relaxation).  2. Right ventricular systolic function is normal. The right ventricular size is normal. There is normal pulmonary artery systolic pressure.  3. The mitral valve is normal in structure. No evidence of mitral valve regurgitation. No evidence of mitral stenosis.  4. The aortic valve is normal in structure. Aortic valve regurgitation is not visualized. Mild to moderate aortic valve sclerosis/calcification is present, without any evidence of aortic stenosis.  5. The inferior vena cava is normal in size with greater than 50% respiratory variability, suggesting right atrial pressure of 3 mmHg. FINDINGS  Left Ventricle: Left ventricular ejection fraction, by estimation, is 65 to 70%. The left ventricle has normal function. The left ventricle has no regional wall motion abnormalities. The left ventricular internal cavity size was normal in size. There is  no left ventricular hypertrophy. Left ventricular diastolic parameters are consistent with Grade I diastolic dysfunction (impaired relaxation). Normal left ventricular filling pressure. Right Ventricle: The right ventricular size is normal. No increase in right ventricular wall thickness. Right ventricular systolic function is normal. There is normal pulmonary artery systolic pressure. Left Atrium: Left atrial size was normal in size. Right Atrium: Right atrial size was normal in size. Pericardium: There is no evidence of pericardial effusion. Mitral Valve: The mitral valve is normal in structure. No evidence of mitral valve regurgitation. No evidence of mitral valve  stenosis. Tricuspid Valve: The tricuspid valve is normal in structure. Tricuspid valve regurgitation is mild . No evidence of tricuspid stenosis. Aortic Valve: The aortic valve is normal in structure. Aortic valve regurgitation is not visualized. Mild to moderate aortic valve sclerosis/calcification is present, without any evidence of aortic stenosis. Pulmonic Valve: The pulmonic valve was normal in structure. Pulmonic valve regurgitation is not visualized. No evidence of pulmonic stenosis. Aorta: The aortic root is normal in size and structure. Venous: The inferior vena cava is normal in size with greater than 50% respiratory variability, suggesting right atrial pressure of 3 mmHg. IAS/Shunts: No atrial level shunt detected by color flow Doppler.  LEFT VENTRICLE PLAX 2D LVIDd:         4.00 cm     Diastology LVIDs:         3.00 cm     LV e' medial:    4.38 cm/s LV PW:         0.70 cm     LV E/e' medial:  11.1 LV IVS:        0.70 cm     LV e' lateral:   5.26 cm/s LVOT diam:     2.00 cm     LV E/e' lateral: 9.3 LV SV:         65 LV SV Index:   37 LVOT Area:     3.14 cm  LV Volumes (MOD) LV vol d, MOD A4C: 51.1 ml LV vol s, MOD A4C: 25.9 ml LV SV MOD A4C:     51.1 ml RIGHT VENTRICLE RV S prime:     9.73 cm/s TAPSE (M-mode): 1.7 cm LEFT ATRIUM           Index      RIGHT ATRIUM  Index LA diam:      2.30 cm 1.29 cm/m RA Area:     6.27 cm LA Vol (A2C): 13.1 ml 7.36 ml/m RA Volume:   9.92 ml  5.58 ml/m LA Vol (A4C): 11.4 ml 6.41 ml/m  AORTIC VALVE LVOT Vmax:   123.00 cm/s LVOT Vmean:  82.300 cm/s LVOT VTI:    0.207 m  AORTA Ao Root diam: 2.90 cm MITRAL VALVE MV Area (PHT): 3.27 cm    SHUNTS MV Decel Time: 232 msec    Systemic VTI:  0.21 m MV E velocity: 48.80 cm/s  Systemic Diam: 2.00 cm MV A velocity: 67.70 cm/s MV E/A ratio:  0.72 Tobias Alexander MD Electronically signed by Tobias Alexander MD Signature Date/Time: 09/01/2020/2:21:14 PM    Final    CT RENAL STONE STUDY  Result Date: 09/01/2020 CLINICAL  DATA:  Flank pain. EXAM: CT ABDOMEN AND PELVIS WITHOUT CONTRAST TECHNIQUE: Multidetector CT imaging of the abdomen and pelvis was performed following the standard protocol without IV contrast. COMPARISON:  Abdomen and pelvis radiographs dated 08/15/2009. FINDINGS: Lower chest: Unremarkable. Hepatobiliary: 6 mm probable noncalcified gallstone in the gallbladder. No gallbladder wall thickening or pericholecystic fluid. Unremarkable liver. Pancreas: Unremarkable. No pancreatic ductal dilatation or surrounding inflammatory changes. Spleen: Normal in size without focal abnormality. Adrenals/Urinary Tract: Normal appearing adrenal glands. Horseshoe kidney. 1.0 cm rounded low density mass in the anterior aspect of the kidney on the left. This measures 51 Hounsfield units in density on image number 36 series 3. No urinary tract calculi or hydronephrosis. Stomach/Bowel: Unremarkable stomach, small bowel and colon. No evidence of appendicitis. Vascular/Lymphatic: Atheromatous arterial calcifications without aneurysm. No enlarged lymph nodes. Reproductive: Mildly enlarged prostate gland. Other: No abdominal wall hernia or abnormality. No abdominopelvic ascites. Musculoskeletal: Lumbar and lower thoracic spine degenerative changes. IMPRESSION: 1. No acute abnormality. 2. Horseshoe kidney. 3. 1.0 cm rounded low density mass in the anterior aspect of the kidney on the left. This could represent a complicated cyst or solid mass. Further evaluation with pre and post contrast MRI should be considered. Pre and post contrast CT could alternatively be performed, but would likely be of decreased accuracy given lesion size. 4. 6 mm probable noncalcified gallstone in the gallbladder. 5. Mildly enlarged prostate gland. Electronically Signed   By: Beckie Salts M.D.   On: 09/01/2020 08:32     Medical Consultants:    None.  Anti-Infectives:   none  Subjective:    Annye English he denies any chest pain or shortness of breath he  does relate some left shoulder pain.  Left-sided leg weakness.  Objective:    Vitals:   09/01/20 1541 09/01/20 1940 09/02/20 0014 09/02/20 0339  BP: 103/83 124/71 (!) 162/81 117/61  Pulse: (!) 104 (!) 101 97 86  Resp:  18 18 20   Temp: 98.6 F (37 C) 98.9 F (37.2 C) 97.6 F (36.4 C) 97.8 F (36.6 C)  TempSrc: Axillary Axillary  Oral  SpO2: 97% 98% 95% 97%   SpO2: 97 %   Intake/Output Summary (Last 24 hours) at 09/02/2020 1051 Last data filed at 09/02/2020 0800 Gross per 24 hour  Intake 2617.08 ml  Output 1000 ml  Net 1617.08 ml   There were no vitals filed for this visit.  Exam: General exam: In no acute distress. Respiratory system: Good air movement and clear to auscultation. Cardiovascular system: S1 & S2 heard, RRR. No JVD. Gastrointestinal system: Abdomen is nondistended, soft and nontender.  Central nervous system: Awake alert and oriented  x2 able to move all 4 extremities without any difficulties, the left side does not appear weak there is limited active movement on the left side Extremities: No pedal edema. Skin: No rashes, lesions or ulcers Psychiatry: Judgement and insight appear normal. Mood & affect appropriate.    Data Reviewed:    Labs: Basic Metabolic Panel: Recent Labs  Lab 08/31/20 2034 08/31/20 2034 08/31/20 2247 08/31/20 2247 09/01/20 0203 09/01/20 0203 09/01/20 1244 09/02/20 0157  NA 146*  --  146*  --  149*  --  147* 138  K 4.8   < > 4.2   < > 3.9   < > 3.9 3.6  CL 110  --  111  --  111  --  110 105  CO2 19*  --   --   --  26  --  27 25  GLUCOSE 183*  --  148*  --  113*  --  171* 114*  BUN 80*  --  78*  --  76*  --  79* 62*  CREATININE 1.50*  --  1.30*  --  1.40*  --  1.42* 1.13  CALCIUM 9.4  --   --   --  9.3  --  9.0 8.5*   < > = values in this interval not displayed.   GFR CrCl cannot be calculated (Unknown ideal weight.). Liver Function Tests: Recent Labs  Lab 08/31/20 2034 09/01/20 0203  AST 388* 477*  ALT 471* 614*   ALKPHOS 73 72  BILITOT 1.8* 1.7*  PROT 7.2 6.8  ALBUMIN 3.7 3.6   No results for input(s): LIPASE, AMYLASE in the last 168 hours. Recent Labs  Lab 08/31/20 2038 09/01/20 0730  AMMONIA 62* 37*   Coagulation profile Recent Labs  Lab 09/01/20 0203  INR 1.2   COVID-19 Labs  No results for input(s): DDIMER, FERRITIN, LDH, CRP in the last 72 hours.  Lab Results  Component Value Date   SARSCOV2NAA NEGATIVE 08/31/2020   SARSCOV2NAA NEGATIVE 09/12/2019   SARSCOV2NAA NEGATIVE 05/03/2019    CBC: Recent Labs  Lab 08/31/20 2034 08/31/20 2247 09/01/20 0203  WBC 19.4*  --  18.4*  NEUTROABS 17.0*  --  16.2*  HGB 16.5 12.9* 15.3  HCT 48.8 38.0* 45.0  MCV 91.4  --  90.5  PLT 212  --  204   Cardiac Enzymes: Recent Labs  Lab 09/01/20 0134 09/02/20 0157  CKTOTAL 4,614* 3,065*   BNP (last 3 results) No results for input(s): PROBNP in the last 8760 hours. CBG: Recent Labs  Lab 08/31/20 2024  GLUCAP 178*   D-Dimer: No results for input(s): DDIMER in the last 72 hours. Hgb A1c: Recent Labs    09/01/20 0256  HGBA1C 6.0*   Lipid Profile: Recent Labs    09/01/20 0257  CHOL 143  HDL 46  LDLCALC 77  TRIG 102  CHOLHDL 3.1   Thyroid function studies: No results for input(s): TSH, T4TOTAL, T3FREE, THYROIDAB in the last 72 hours.  Invalid input(s): FREET3 Anemia work up: No results for input(s): VITAMINB12, FOLATE, FERRITIN, TIBC, IRON, RETICCTPCT in the last 72 hours. Sepsis Labs: Recent Labs  Lab 08/31/20 2034 09/01/20 0203  WBC 19.4* 18.4*   Microbiology Recent Results (from the past 240 hour(s))  Respiratory Panel by RT PCR (Flu A&B, Covid) - Nasopharyngeal Swab     Status: None   Collection Time: 08/31/20  8:40 PM   Specimen: Nasopharyngeal Swab  Result Value Ref Range Status   SARS Coronavirus 2 by  RT PCR NEGATIVE NEGATIVE Final    Comment: (NOTE) SARS-CoV-2 target nucleic acids are NOT DETECTED.  The SARS-CoV-2 RNA is generally detectable in  upper respiratoy specimens during the acute phase of infection. The lowest concentration of SARS-CoV-2 viral copies this assay can detect is 131 copies/mL. A negative result does not preclude SARS-Cov-2 infection and should not be used as the sole basis for treatment or other patient management decisions. A negative result may occur with  improper specimen collection/handling, submission of specimen other than nasopharyngeal swab, presence of viral mutation(s) within the areas targeted by this assay, and inadequate number of viral copies (<131 copies/mL). A negative result must be combined with clinical observations, patient history, and epidemiological information. The expected result is Negative.  Fact Sheet for Patients:  https://www.moore.com/  Fact Sheet for Healthcare Providers:  https://www.young.biz/  This test is no t yet approved or cleared by the [place] FDA and  has been authorized for detection and/or diagnosis of SARS-CoV-2 by FDA under an Emergency Use Authorization (EUA). This EUA will remain  in effect (meaning this test can be used) for the duration of the COVID-19 declaration under Section 564(b)(1) of the Act, 21 U.S.C. section 360bbb-3(b)(1), unless the authorization is terminated or revoked sooner.     Influenza A by PCR NEGATIVE NEGATIVE Final   Influenza B by PCR NEGATIVE NEGATIVE Final    Comment: (NOTE) The Xpert Xpress SARS-CoV-2/FLU/RSV assay is intended as an aid in  the diagnosis of influenza from Nasopharyngeal swab specimens and  should not be used as a sole basis for treatment. Nasal washings and  aspirates are unacceptable for Xpert Xpress SARS-CoV-2/FLU/RSV  testing.  Fact Sheet for Patients: https://www.moore.com/  Fact Sheet for Healthcare Providers: https://www.young.biz/  This test is not yet approved or cleared by the [place] FDA and  has been  authorized for detection and/or diagnosis of SARS-CoV-2 by  FDA under an Emergency Use Authorization (EUA). This EUA will remain  in effect (meaning this test can be used) for the duration of the  Covid-19 declaration under Section 564(b)(1) of the Act, 21  U.S.C. section 360bbb-3(b)(1), unless the authorization is  terminated or revoked. Performed at [facility], [address], [place], [place] [address]   Culture, blood (routine x 2)     Status: None (Preliminary result)   Collection Time: 09/01/20  7:30 AM   Specimen: BLOOD  Result Value Ref Range Status   Specimen Description BLOOD SITE NOT SPECIFIED  Final   Special Requests   Final    BOTTLES DRAWN AEROBIC AND ANAEROBIC Blood Culture adequate volume   Culture  Setup Time   Final    AEROBIC BOTTLE ONLY GRAM POSITIVE COCCI CRITICAL VALUE NOTED.  VALUE IS CONSISTENT WITH PREVIOUSLY REPORTED AND CALLED VALUE.    Culture   Final    NO GROWTH < 24 HOURS Performed at [facility], [address], [place], [place] [address]    Report Status PENDING  Incomplete  Culture, blood (routine x 2)     Status: None (Preliminary result)   Collection Time: 09/01/20  7:30 AM   Specimen: BLOOD  Result Value Ref Range Status   Specimen Description BLOOD SITE NOT SPECIFIED  Final   Special Requests   Final    BOTTLES DRAWN AEROBIC AND ANAEROBIC Blood Culture adequate volume   Culture  Setup Time   Final    GRAM POSITIVE COCCI IN BOTH AEROBIC AND ANAEROBIC BOTTLES CRITICAL RESULT CALLED  TO, READ BACK BY AND VERIFIED WITH: Melven Sartorius [facility] 09/02/20 0132 JDW    Culture   Final    TOO YOUNG TO READ Performed at [facility], [address] [place], [place] [address]    Report Status PENDING  Incomplete  Blood Culture ID Panel (Reflexed)     Status: Abnormal   Collection Time: 09/01/20  7:30 AM  Result Value Ref Range Status   Enterococcus faecalis NOT DETECTED NOT DETECTED Final   Enterococcus Faecium NOT DETECTED NOT  DETECTED Final   Listeria monocytogenes NOT DETECTED NOT DETECTED Final   Staphylococcus species DETECTED (A) NOT DETECTED Final    Comment: CRITICAL RESULT CALLED TO, READ BACK BY AND VERIFIED WITH: J LEDFORD PHARMD 09/02/20 0132 JDW    Staphylococcus aureus (BCID) NOT DETECTED NOT DETECTED Final   Staphylococcus epidermidis DETECTED (A) NOT DETECTED Final    Comment: Methicillin (oxacillin) resistant coagulase negative staphylococcus. Possible blood culture contaminant (unless isolated from more than one blood culture draw or clinical case suggests pathogenicity). No antibiotic treatment is indicated for blood  culture contaminants. CRITICAL RESULT CALLED TO, READ BACK BY AND VERIFIED WITH: J LEDFORD PHARMD 09/02/20 0132 JDW    Staphylococcus lugdunensis NOT DETECTED NOT DETECTED Final   Streptococcus species NOT DETECTED NOT DETECTED Final   Streptococcus agalactiae NOT DETECTED NOT DETECTED Final   Streptococcus pneumoniae NOT DETECTED NOT DETECTED Final   Streptococcus pyogenes NOT DETECTED NOT DETECTED Final   A.calcoaceticus-baumannii NOT DETECTED NOT DETECTED Final   Bacteroides fragilis NOT DETECTED NOT DETECTED Final   Enterobacterales NOT DETECTED NOT DETECTED Final   Enterobacter cloacae complex NOT DETECTED NOT DETECTED Final   Escherichia coli NOT DETECTED NOT DETECTED Final   Klebsiella aerogenes NOT DETECTED NOT DETECTED Final   Klebsiella oxytoca NOT DETECTED NOT DETECTED Final   Klebsiella pneumoniae NOT DETECTED NOT DETECTED Final   Proteus species NOT DETECTED NOT DETECTED Final   Salmonella species NOT DETECTED NOT DETECTED Final   Serratia marcescens NOT DETECTED NOT DETECTED Final   Haemophilus influenzae NOT DETECTED NOT DETECTED Final   Neisseria meningitidis NOT DETECTED NOT DETECTED Final   Pseudomonas aeruginosa NOT DETECTED NOT DETECTED Final   Stenotrophomonas maltophilia NOT DETECTED NOT DETECTED Final   Candida albicans NOT DETECTED NOT DETECTED Final    Candida auris NOT DETECTED NOT DETECTED Final   Candida glabrata NOT DETECTED NOT DETECTED Final   Candida krusei NOT DETECTED NOT DETECTED Final   Candida parapsilosis NOT DETECTED NOT DETECTED Final   Candida tropicalis NOT DETECTED NOT DETECTED Final   Cryptococcus neoformans/gattii NOT DETECTED NOT DETECTED Final   Methicillin resistance mecA/C DETECTED (A) NOT DETECTED Final    Comment: CRITICAL RESULT CALLED TO, READ BACK BY AND VERIFIED WITHMelven Sartorius [facility] 09/02/20 0132 JDW Performed at [facility], [address], [place], [place] [address]      Medications:   . aspirin  300 mg Rectal Daily   Or  . aspirin  325 mg Oral Daily  . enoxaparin (LOVENOX) injection  40 mg Subcutaneous Q24H  . nicotine  14 mg Transdermal Daily  . [START ON 09/03/2020] tamsulosin  0.8 mg Oral QPC breakfast   Continuous Infusions: . vancomycin 750 mg (09/02/20 0332)      LOS: 1 day   Marinda Elk  [facility]  09/02/2020, 10:51 AM

## 2020-09-03 LAB — BASIC METABOLIC PANEL
Anion gap: 7 (ref 5–15)
BUN: 34 mg/dL — ABNORMAL HIGH (ref 8–23)
CO2: 26 mmol/L (ref 22–32)
Calcium: 8.1 mg/dL — ABNORMAL LOW (ref 8.9–10.3)
Chloride: 107 mmol/L (ref 98–111)
Creatinine, Ser: 0.98 mg/dL (ref 0.61–1.24)
GFR, Estimated: >60 mL/min (ref >60)
Glucose, Bld: 100 mg/dL — ABNORMAL HIGH (ref 70–99)
Potassium: 3.7 mmol/L (ref 3.5–5.1)
Sodium: 140 mmol/L (ref 135–145)

## 2020-09-03 LAB — HEPATIC FUNCTION PANEL
ALT: 663 U/L — ABNORMAL HIGH (ref 0–44)
AST: 242 U/L — ABNORMAL HIGH (ref 15–41)
Albumin: 2.5 g/dL — ABNORMAL LOW (ref 3.5–5.0)
Alkaline Phosphatase: 58 U/L (ref 38–126)
Bilirubin, Direct: 0.1 mg/dL (ref 0.0–0.2)
Indirect Bilirubin: 0.6 mg/dL (ref 0.3–0.9)
Total Bilirubin: 0.7 mg/dL (ref 0.3–1.2)
Total Protein: 5.2 g/dL — ABNORMAL LOW (ref 6.5–8.1)

## 2020-09-03 LAB — CBC WITH DIFFERENTIAL/PLATELET
Abs Immature Granulocytes: 0.18 10*3/uL — ABNORMAL HIGH (ref 0.00–0.07)
Basophils Absolute: 0 10*3/uL (ref 0.0–0.1)
Basophils Relative: 0 %
Eosinophils Absolute: 0.1 10*3/uL (ref 0.0–0.5)
Eosinophils Relative: 1 %
HCT: 35.3 % — ABNORMAL LOW (ref 39.0–52.0)
Hemoglobin: 11.8 g/dL — ABNORMAL LOW (ref 13.0–17.0)
Immature Granulocytes: 2 %
Lymphocytes Relative: 19 %
Lymphs Abs: 1.6 10*3/uL (ref 0.7–4.0)
MCH: 30.6 pg (ref 26.0–34.0)
MCHC: 33.4 g/dL (ref 30.0–36.0)
MCV: 91.5 fL (ref 80.0–100.0)
Monocytes Absolute: 0.6 10*3/uL (ref 0.1–1.0)
Monocytes Relative: 7 %
Neutro Abs: 5.9 10*3/uL (ref 1.7–7.7)
Neutrophils Relative %: 71 %
Platelets: 125 10*3/uL — ABNORMAL LOW (ref 150–400)
RBC: 3.86 MIL/uL — ABNORMAL LOW (ref 4.22–5.81)
RDW: 12.6 % (ref 11.5–15.5)
WBC: 8.4 10*3/uL (ref 4.0–10.5)
nRBC: 0 % (ref 0.0–0.2)

## 2020-09-03 LAB — CK: Total CK: 1474 U/L — ABNORMAL HIGH (ref 49–397)

## 2020-09-03 MED ORDER — BUPIVACAINE HCL (PF) 0.5 % IJ SOLN
10.0000 mL | Freq: Once | INTRAMUSCULAR | Status: DC
  Filled 2020-09-03: qty 10

## 2020-09-03 MED ORDER — SODIUM CHLORIDE 0.9 % IV SOLN
100.0000 mg | Freq: Two times a day (BID) | INTRAVENOUS | Status: DC
  Administered 2020-09-03 – 2020-09-04 (×2): 100 mg via INTRAVENOUS
  Filled 2020-09-03 (×3): qty 100

## 2020-09-03 MED ORDER — METHYLPREDNISOLONE ACETATE 40 MG/ML IJ SUSP
40.0000 mg | Freq: Once | INTRAMUSCULAR | Status: DC
  Filled 2020-09-03: qty 1

## 2020-09-03 NOTE — Consult Note (Signed)
ORTHOPAEDIC CONSULTATION  REQUESTING PHYSICIAN: Marinda Elk, MD  Chief Complaint: Left shoulder pain/weakness  HPI: Bryan Davies is a 69 y.o. male with past medical history of HTN, shingles, BPH, bipolar disorder, and anxiety  who complains of left shoulder pain and weakness. He has a recent history of a stroke on 01/16/2019 s/p carotid stenting. He was found on the floor by his girlfriend on 11/3 who found him to be confused and weak and he was brought to the ED.   Today he complains of left shoulder weakness and pain, states both have been ongoing since he was found down. Rates pain a 7/10 today diffusely at left shoulder, worse with movement and palpation. Denies numbness or tingling in shoulder, arm, or left hand. Denies previous injury or surgery to this shoulder, denies neck pain.   Past Medical History:  Diagnosis Date  . Anxiety   . Hypertension   . Shingles 2013  . Shoulder pain    Past Surgical History:  Procedure Laterality Date  . IR ANGIO VERTEBRAL SEL SUBCLAVIAN INNOMINATE UNI R MOD SED  01/16/2019  . IR CT HEAD LTD  01/16/2019  . IR INTRAVSC STENT CERV CAROTID W/O EMB-PROT MOD SED INC ANGIO  01/16/2019  . IR PERCUTANEOUS ART THROMBECTOMY/INFUSION INTRACRANIAL INC DIAG ANGIO  01/16/2019  . RADIOLOGY WITH ANESTHESIA N/A 01/15/2019   Procedure: IR WITH ANESTHESIA;  Surgeon: Julieanne Cotton, MD;  Location: [facility] OR;  Service: Radiology;  Laterality: N/A;   Social History   Socioeconomic History  . Marital status: Single    Spouse name: Not on file  . Number of children: Not on file  . Years of education: Not on file  . Highest education level: Not on file  Occupational History  . Occupation: Carpenter  Tobacco Use  . Smoking status: Current Every Day Smoker    Packs/day: 2.00    Years: 50.00    Pack years: 100.00    Types: Cigarettes  . Smokeless tobacco: Never Used  . Tobacco comment: 10  cigarettes per day, waits an hour between  Vaping Use  . Vaping  Use: Never used  Substance and Sexual Activity  . Alcohol use: No  . Drug use: No  . Sexual activity: Not on file  Other Topics Concern  . Not on file  Social History Narrative  . Not on file   Social Determinants of Health   Financial Resource Strain:   . Difficulty of Paying Living Expenses: Not on file  Food Insecurity:   . Worried About Programme researcher, broadcasting/film/video in the Last Year: Not on file  . Ran Out of Food in the Last Year: Not on file  Transportation Needs:   . Lack of Transportation (Medical): Not on file  . Lack of Transportation (Non-Medical): Not on file  Physical Activity:   . Days of Exercise per Week: Not on file  . Minutes of Exercise per Session: Not on file  Stress:   . Feeling of Stress : Not on file  Social Connections:   . Frequency of Communication with Friends and Family: Not on file  . Frequency of Social Gatherings with Friends and Family: Not on file  . Attends Religious Services: Not on file  . Active Member of Clubs or Organizations: Not on file  . Attends Banker Meetings: Not on file  . Marital Status: Not on file   Family History  Family history unknown: Yes   Allergies  Allergen Reactions  . Pollen  Extract Other (See Comments)    Sneezing     Positive ROS: All other systems have been reviewed and were otherwise negative with the exception of those mentioned in the HPI and as above.  Physical Exam: General: Alert, no acute distress. Cardiovascular: No pedal edema Respiratory: No cyanosis, no use of accessory musculature. Coughing on exam. GI: No organomegaly, abdomen is soft and non-tender Skin: No lesions over shoulder. No erythema at left shoulder.  cellulitis noted at chest well and left ear, left ear draining. Neurologic: Sensation intact distally.  Psychiatric: Judgement and insight appear normal.  Lymphatic: No axillary or cervical lymphadenopathy  MUSCULOSKELETAL: No edema or erythema of left shoulder. Patient  unwilling to forward flex due to pain. 0-90 degrees of PROM. 10 degrees of active external rotation. Unable to assess internal rotation. 4/5 strength on flexion and elbow, 4/5 strength on extension at elbow. Sensation intact to lateral shoulder. + radial pulse. Sensation intact to median, radial, and ulnar nerves. Able to flex, extend, and abduct all fingers of left hand though noted flexion contracture of 4th and 5th fingers.   Imaging: X-ray of left shoulder shows AC and glenohumeral joint degenerative changes and a high-riding shoulder indicative of a rotator cuff tear  Assessment/Plan: Principal Problem:   Left-sided weakness Active Problems:   Hypertension   Bipolar disorder (HCC)   ARF (acute renal failure) (HCC)   Elevated troponin   Elevated LFTs   Non-ST elevation (NSTEMI) myocardial infarction [facility])   Non-traumatic rhabdomyolysis  Left shoulder weakness and pain: - likely due to rotator cuff arthropathy, x-rays noting high riding shoulder and decreased ROM, no edema or erythema of left shoulder that might indicate septic arthritis though will watch new blood cultures - plan for pain control with subacromial injection today - patient to follow-up with Dr. Dion Saucier after discharge, further imaging and surgical planning can be performed for possible elective management of rotator cuff tear after discharge and when medical status is improved  PRE-PROCEDURE DIAGNOSIS:  Left shoulder pain and weakness POST-OPERATIVE DIAGNOSIS:  Same PROCEDURE:  Subacromial injection of left shoulder  PROCEDURE DETAILS:  After informed verbal consent was obtained the posterior shoulder was prepped with chlorhexidine. Posterior portal was identified and  2 ml of Marcaine and 1 ml of Depo-Medrol (40mg ) was then injected into the subacromial space. He tolerated this well without complication and a Band-Aid was placed.   09/03/2020 12:56 PM  13/03/2020, PA-C   09/03/2020 12:56 PM

## 2020-09-03 NOTE — TOC Progression Note (Signed)
Transition of Care [facility]) - Progression Note    Patient Details  Name: ORANGE HILLIGOSS MRN: [id number] Date of Birth: 06-18-1951  [facility] CM/SW Contact  Carley Hammed, [place] Phone Number: 09/03/2020, 3:15 PM  Clinical Narrative:    CSW spoke with pt who note we could contact his brother Jonny Ruiz [phone]) to discuss discharge plans instead of his girlfriend, Alcario Drought. Alcario Drought has noted her inability to help make decisions, but calls frequently to follow up. Pt's brother stated that he is on board with any decisions and has no questions. Pt is agreeable to go to [facility], and facility will be following. PASSR went to a level 2, clinicals sent in. Will follow for discharge planning.   Expected Discharge Plan: Skilled Nursing Facility Barriers to Discharge: Continued Medical Work up  Expected Discharge Plan and Services Expected Discharge Plan: Skilled Nursing Facility In-house Referral: Clinical Social Work   Post Acute Care Choice: Skilled Nursing Facility Living arrangements for the past 2 months: Single Family Home                                       Social Determinants of Health (SDOH) Interventions    Readmission Risk Interventions No flowsheet data found.

## 2020-09-03 NOTE — Progress Notes (Signed)
[facility] PROGRESS NOTE    Progress Note  Bryan Davies  WUJ:[id number] DOB: January 06, 1951 DOA: 08/31/2020 PCP: Gean Birchwood, [facility]     Brief Narrative:   Bryan Davies is an 69 y.o. male past medical history of stroke in 01/16/2019 status post carotid stenting, essential hypertension ongoing tobacco abuse bipolar disorder brought into the ED as he was found in the floor confused and weak on the left side by his girlfriend  Assessment/Plan:   Left-sided weakness dueto a rotator cuff tear: HgbA1c 6.0, fasting lipid panel HDL greater than 40 LDL less than 100 Repeated MRI showed no acute findings. PT, OT valuated the patient recommended skilled nursing facility. Continue aspirin Continue to hold statin due to rhabdo. We will be more aggressive with pressure control.  BP Is improved. Blood  reflex panel ID shows staph epididymitis likely a contaminant blood cultures are pending. He has remained afebrile.  Continue to hold antibiotics. We will consult orthopedic surgery.  Acute kidney injury/in the setting of nontraumatic rhabdomyolysis: Baseline creatinine of around 1, on admission 1.4. Creatinine returned to baseline after IV fluid hydration his CK is now below 1500 we will KVO IV fluids. MRI of kidney that showed a simple cyst radiology recommended to repeat an MRI in 6 to 12 months.  Elevated troponins: Twelve-lead EKG showed some ST segment elevation V2 through V4. Likely due to rhabdomyolysis.  Elevated LFTs: Likely due to rhabdomyolysis. Hepatitis panel is negative HIV is nonreactive.  Acute encephalopathy: His ammonia level was elevated, has no history of liver disease. Depakote level is 11, resume depakote  History of bipolar disorder: Resume Depakote.  History of essential hypertension: On IV hydralazine  BPH: On CT scan there that showed a enlarged prostate, insert a Foley start on Flomax  Cellulitis of the the chest wall and ear: Start IV doxy.   New  left rotator cuff tear: We will consult orthopedic surgery  DVT prophylaxis: lovenxo Family Communication:none Status is: Inpatient  Remains inpatient appropriate because:Hemodynamically unstable   Dispo: The patient is from: Home              Anticipated d/c is to: SNF              Anticipated d/c date is: > 3 days              Patient currently is not medically stable to d/c.        Code Status:     Code Status Orders  (From admission, onward)         Start     Ordered   09/01/20 0204  Full code  Continuous        09/01/20 0206        Code Status History    Date Active Date Inactive Code Status Order ID Comments User Context   05/27/2019 1621 06/01/2019 1600 Full Code [id number]  Talmage Nap, NP Inpatient   05/26/2019 2356 05/27/2019 1537 Full Code [id number]  Ward, Layla Maw, DO ED   05/03/2019 0250 05/04/2019 2026 Full Code [id number]  Dione Booze, MD ED   01/15/2019 2115 01/18/2019 1754 Full Code [id number]  Aroor, Dara Lords, MD ED   Advance Care Planning Activity        IV Access:    Peripheral IV   Procedures and diagnostic studies:   MR BRAIN WO CONTRAST  Result Date: 09/02/2020 CLINICAL DATA:  Stroke, follow-up. EXAM: MRI HEAD WITHOUT CONTRAST TECHNIQUE: Multiplanar, multiecho pulse sequences of the  brain and surrounding structures were obtained without intravenous contrast. COMPARISON:  Brain MRI 09/01/2020. FINDINGS: An abbreviated protocol brain was performed at the ordering provider's request. Only axial and coronal diffusion-weighted sequences and an axial SWI sequence were obtained. There is no evidence of acute infarction. Apparent punctate focus of restricted diffusion within the right cerebellar hemisphere appreciated on the axial DWI sequence only (series 5, image 68). This finding is not appreciated on the coronal DWI sequence and likely reflects image noise. Otherwise, there is no appreciable interval change as compared to the brain MRI of  09/01/2020. 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 Chronic hemosiderin staining at this site. Remote right thalamic lacunar infarct. Generalized cerebral atrophy. IMPRESSION: Abbreviated protocol brain MRI consisting of only diffusion-weighted and SWI sequences. No evidence of acute infarction. Electronically Signed   By: Jackey Loge DO   On: 09/02/2020 18:25   MR ABDOMEN W WO CONTRAST  Result Date: 09/03/2020 CLINICAL DATA:  Evaluate kidney mass EXAM: MRI ABDOMEN WITHOUT AND WITH CONTRAST TECHNIQUE: Multiplanar multisequence MR imaging of the abdomen was performed both before and after the administration of intravenous contrast. CONTRAST:  6.80mL GADAVIST GADOBUTROL 1 MMOL/ML IV SOLN COMPARISON:  CT AP 09/01/2020 FINDINGS: Lower chest: No acute findings. Hepatobiliary: No suspicious liver abnormality identified. Stone within the gallbladder neck measures 1 cm, image 17/5. No signs of gallbladder wall inflammation or biliary ductal dilatation. Pancreas:  No main duct dilatation, inflammation or mass. Spleen:  Within normal limits in size and appearance. Adrenals/Urinary Tract:  Adrenal glands are unremarkable. Horseshoe kidney identified. Corresponding to the CT abnormality is a well-circumscribed T2 hyperintense lesion arising off the anteromedial cortex of the left kidney measuring 1.1 x 0.9 cm. No internal septation or mural nodule noted. On the postcontrast images this lesion appears blurred by respiratory motion artifact limiting evaluation for evidence of internal enhancement/complexity. No additional focal kidney lesions identified. Mild bilateral perinephric fat stranding is identified which is unchanged from previous exam and may be related to prior insult. No hydronephrosis. Stomach/Bowel: Visualized portions within the abdomen are unremarkable. Vascular/Lymphatic:  No aneurysm.  No abdominal adenopathy. Other:  No significant free fluid or fluid collections.  Musculoskeletal: No suspicious bone lesions identified. Note: Diminished exam detail secondary to respiratory motion artifact. IMPRESSION: 1. Diminished exam detail due to respiratory motion artifact. 2. Horseshoe kidney. 3. Corresponding to the CT abnormality is a unilocular, uniformly T2 hyperintense cyst arising off the anteromedial cortex of the left kidney. No signs of internal complexity on the precontrast T2 weighted images. This is obscured on the postcontrast imaging limiting assessment for internal enhancement. Given the small size of this lesion and the uniformly T2 hyperintense this is favored to represent a simple cyst but is technically incompletely characterized. Consider follow-up imaging and 6 to 12 months with renal protocol MRI or CT without and with contrast material. In a patient who may have difficulty remaining motionless, CT may be more appropriate as this is less susceptible to motion artifact. 4. Horseshoe kidney. 5. Gallstone. Electronically Signed   By: Signa Kell M.D.   On: 09/03/2020 04:51   ECHOCARDIOGRAM COMPLETE  Result Date: 09/01/2020    ECHOCARDIOGRAM REPORT   Patient Name:   Bryan Davies [facility] Date of Exam: 09/01/2020 Medical Rec #:  [id number]     Height:       67.0 in Accession #:    [id number]    Weight:       148.0 lb Date of Birth:  [birth year]  BSA:          1.779 m Patient Age:    69 years      BP:           102/67 mmHg Patient Gender: M             HR:           95 bpm. Exam Location:  Inpatient Procedure: 2D Echo, Cardiac Doppler and Color Doppler Indications:    Elevated troponin  History:        Patient has prior history of Echocardiogram examinations, most                 recent 01/16/2019. Risk Factors:Hypertension, Dyslipidemia and                 Current Smoker. Elevated troponin.  Sonographer:    Renella CunasJulia Swaim RDCS Referring Phys: [id number] Meryle ReadyARSHAD N [facility] Center[facility]AKRA[facility]ANDY  Sonographer Comments: Patient unable to turn on side. IMPRESSIONS  1. Left ventricular ejection fraction, by  estimation, is 65 to 70%. The left ventricle has normal function. The left ventricle has no regional wall motion abnormalities. Left ventricular diastolic parameters are consistent with Grade I diastolic dysfunction (impaired relaxation).  2. Right ventricular systolic function is normal. The right ventricular size is normal. There is normal pulmonary artery systolic pressure.  3. The mitral valve is normal in structure. No evidence of mitral valve regurgitation. No evidence of mitral stenosis.  4. The aortic valve is normal in structure. Aortic valve regurgitation is not visualized. Mild to moderate aortic valve sclerosis/calcification is present, without any evidence of aortic stenosis.  5. The inferior vena cava is normal in size with greater than 50% respiratory variability, suggesting right atrial pressure of 3 mmHg. FINDINGS  Left Ventricle: Left ventricular ejection fraction, by estimation, is 65 to 70%. The left ventricle has normal function. The left ventricle has no regional wall motion abnormalities. The left ventricular internal cavity size was normal in size. There is  no left ventricular hypertrophy. Left ventricular diastolic parameters are consistent with Grade I diastolic dysfunction (impaired relaxation). Normal left ventricular filling pressure. Right Ventricle: The right ventricular size is normal. No increase in right ventricular wall thickness. Right ventricular systolic function is normal. There is normal pulmonary artery systolic pressure. Left Atrium: Left atrial size was normal in size. Right Atrium: Right atrial size was normal in size. Pericardium: There is no evidence of pericardial effusion. Mitral Valve: The mitral valve is normal in structure. No evidence of mitral valve regurgitation. No evidence of mitral valve stenosis. Tricuspid Valve: The tricuspid valve is normal in structure. Tricuspid valve regurgitation is mild . No evidence of tricuspid stenosis. Aortic Valve: The aortic  valve is normal in structure. Aortic valve regurgitation is not visualized. Mild to moderate aortic valve sclerosis/calcification is present, without any evidence of aortic stenosis. Pulmonic Valve: The pulmonic valve was normal in structure. Pulmonic valve regurgitation is not visualized. No evidence of pulmonic stenosis. Aorta: The aortic root is normal in size and structure. Venous: The inferior vena cava is normal in size with greater than 50% respiratory variability, suggesting right atrial pressure of 3 mmHg. IAS/Shunts: No atrial level shunt detected by color flow Doppler.  LEFT VENTRICLE PLAX 2D LVIDd:         4.00 cm     Diastology LVIDs:         3.00 cm     LV e' medial:    4.38 cm/s LV PW:  0.70 cm     LV E/e' medial:  11.1 LV IVS:        0.70 cm     LV e' lateral:   5.26 cm/s LVOT diam:     2.00 cm     LV E/e' lateral: 9.3 LV SV:         65 LV SV Index:   37 LVOT Area:     3.14 cm  LV Volumes (MOD) LV vol d, MOD A4C: 51.1 ml LV vol s, MOD A4C: 25.9 ml LV SV MOD A4C:     51.1 ml RIGHT VENTRICLE RV S prime:     9.73 cm/s TAPSE (M-mode): 1.7 cm LEFT ATRIUM           Index      RIGHT ATRIUM          Index LA diam:      2.30 cm 1.29 cm/m RA Area:     6.27 cm LA Vol (A2C): 13.1 ml 7.36 ml/m RA Volume:   9.92 ml  5.58 ml/m LA Vol (A4C): 11.4 ml 6.41 ml/m  AORTIC VALVE LVOT Vmax:   123.00 cm/s LVOT Vmean:  82.300 cm/s LVOT VTI:    0.207 m  AORTA Ao Root diam: 2.90 cm MITRAL VALVE MV Area (PHT): 3.27 cm    SHUNTS MV Decel Time: 232 msec    Systemic VTI:  0.21 m MV E velocity: 48.80 cm/s  Systemic Diam: 2.00 cm MV A velocity: 67.70 cm/s MV E/A ratio:  0.72 Tobias Alexander MD Electronically signed by Tobias Alexander MD Signature Date/Time: 09/01/2020/2:21:14 PM    Final      Medical Consultants:    None.  Anti-Infectives:   none  Subjective:    Bryan Davies no new complaints. Objective:    Vitals:   09/03/20 0021 09/03/20 0057 09/03/20 0504 09/03/20 0739  BP: (!) 83/56  120/61  107/67  Pulse: 80  88 86  Resp: Temp: 98 F (36.7 C)  97.7 F (36.5 C) 98 F (36.7 C)  TempSrc: Oral  Oral   SpO2: 99%  100% 99%   SpO2: 99 %   Intake/Output Summary (Last 24 hours) at 09/03/2020 1031 Last data filed at 09/03/2020 0646 Gross per 24 hour  Intake 236 ml  Output 2700 ml  Net -2464 ml   There were no vitals filed for this visit.  Exam: General exam: In no acute distress. Respiratory system: Good air movement and clear to auscultation. Cardiovascular system: S1 & S2 heard, RRR. No JVD. Gastrointestinal system: Abdomen is nondistended, soft and nontender.  Extremities: No pedal edema. Skin: No rashes, lesions or ulcers Psychiatry: Judgement and insight appear normal.   Data Reviewed:    Labs: Basic Metabolic Panel: Recent Labs  Lab 08/31/20 2034 08/31/20 2034 08/31/20 2247 08/31/20 2247 09/01/20 0203 09/01/20 0203 09/01/20 1244 09/01/20 1244 09/02/20 0157 09/03/20 0208  NA 146*   < > 146*  --  149*  --  147*  --  138 140  K 4.8   < > 4.2   < > 3.9   < > 3.9   < > 3.6 3.7  CL 110   < > 111  --  111  --  110  --  105 107  CO2 19*  --   --   --  26  --  27  --  25 26  GLUCOSE 183*   < > 148*  --  113*  --  171*  --  114* 100*  BUN 80*   < > 78*  --  76*  --  79*  --  62* 34*  CREATININE 1.50*   < > 1.30*  --  1.40*  --  1.42*  --  1.13 0.98  CALCIUM 9.4  --   --   --  9.3  --  9.0  --  8.5* 8.1*   < > = values in this interval not displayed.   GFR CrCl cannot be calculated (Unknown ideal weight.). Liver Function Tests: Recent Labs  Lab 08/31/20 2034 09/01/20 0203 09/03/20 0208  AST 388* 477* 242*  ALT 471* 614* 663*  ALKPHOS 73 72 58  BILITOT 1.8* 1.7* 0.7  PROT 7.2 6.8 5.2*  ALBUMIN 3.7 3.6 2.5*   No results for input(s): LIPASE, AMYLASE in the last 168 hours. Recent Labs  Lab 08/31/20 2038 09/01/20 0730  AMMONIA 62* 37*   Coagulation profile Recent Labs  Lab 09/01/20 0203  INR 1.2   COVID-19 Labs  No results  for input(s): DDIMER, FERRITIN, LDH, CRP in the last 72 hours.  Lab Results  Component Value Date   SARSCOV2NAA NEGATIVE 08/31/2020   SARSCOV2NAA NEGATIVE 09/12/2019   SARSCOV2NAA NEGATIVE 05/03/2019    CBC: Recent Labs  Lab 08/31/20 2034 08/31/20 2247 09/01/20 0203 09/03/20 0208  WBC 19.4*  --  18.4* 8.4  NEUTROABS 17.0*  --  16.2* 5.9  HGB 16.5 12.9* 15.3 11.8*  HCT 48.8 38.0* 45.0 35.3*  MCV 91.4  --  90.5 91.5  PLT 212  --  204 125*   Cardiac Enzymes: Recent Labs  Lab 09/01/20 0134 09/02/20 0157 09/03/20 0208  CKTOTAL 4,614* 3,065* 1,474*   BNP (last 3 results) No results for input(s): PROBNP in the last 8760 hours. CBG: Recent Labs  Lab 08/31/20 2024  GLUCAP 178*   D-Dimer: No results for input(s): DDIMER in the last 72 hours. Hgb A1c: Recent Labs    09/01/20 0256  HGBA1C 6.0*   Lipid Profile: Recent Labs    09/01/20 0257  CHOL 143  HDL 46  LDLCALC 77  TRIG 102  CHOLHDL 3.1   Thyroid function studies: No results for input(s): TSH, T4TOTAL, T3FREE, THYROIDAB in the last 72 hours.  Invalid input(s): FREET3 Anemia work up: No results for input(s): VITAMINB12, FOLATE, FERRITIN, TIBC, IRON, RETICCTPCT in the last 72 hours. Sepsis Labs: Recent Labs  Lab 08/31/20 2034 09/01/20 0203 09/03/20 0208  WBC 19.4* 18.4* 8.4   Microbiology Recent Results (from the past 240 hour(s))  Respiratory Panel by RT PCR (Flu A&B, Covid) - Nasopharyngeal Swab     Status: None   Collection Time: 08/31/20  8:40 PM   Specimen: Nasopharyngeal Swab  Result Value Ref Range Status   SARS Coronavirus 2 by RT PCR NEGATIVE NEGATIVE Final    Comment: (NOTE) SARS-CoV-2 target nucleic acids are NOT DETECTED.  The SARS-CoV-2 RNA is generally detectable in upper respiratoy specimens during the acute phase of infection. The lowest concentration of SARS-CoV-2 viral copies this assay can detect is 131 copies/mL. A negative result does not preclude SARS-Cov-2 infection and  should not be used as the sole basis for treatment or other patient management decisions. A negative result may occur with  improper specimen collection/handling, submission of specimen other than nasopharyngeal swab, presence of viral mutation(s) within the areas targeted by this assay, and inadequate number of viral copies (<131 copies/mL). A negative result must be combined with clinical observations, patient history, and  epidemiological information. The expected result is Negative.  Fact Sheet for Patients:  https://www.moore.com/  Fact Sheet for Healthcare Providers:  https://www.young.biz/  This test is no t yet approved or cleared by the [place] FDA and  has been authorized for detection and/or diagnosis of SARS-CoV-2 by FDA under an Emergency Use Authorization (EUA). This EUA will remain  in effect (meaning this test can be used) for the duration of the COVID-19 declaration under Section 564(b)(1) of the Act, 21 U.S.C. section 360bbb-3(b)(1), unless the authorization is terminated or revoked sooner.     Influenza A by PCR NEGATIVE NEGATIVE Final   Influenza B by PCR NEGATIVE NEGATIVE Final    Comment: (NOTE) The Xpert Xpress SARS-CoV-2/FLU/RSV assay is intended as an aid in  the diagnosis of influenza from Nasopharyngeal swab specimens and  should not be used as a sole basis for treatment. Nasal washings and  aspirates are unacceptable for Xpert Xpress SARS-CoV-2/FLU/RSV  testing.  Fact Sheet for Patients: https://www.moore.com/  Fact Sheet for Healthcare Providers: https://www.young.biz/  This test is not yet approved or cleared by the [place] FDA and  has been authorized for detection and/or diagnosis of SARS-CoV-2 by  FDA under an Emergency Use Authorization (EUA). This EUA will remain  in effect (meaning this test can be used) for the duration of the  Covid-19 declaration  under Section 564(b)(1) of the Act, 21  U.S.C. section 360bbb-3(b)(1), unless the authorization is  terminated or revoked. Performed at [facility], [address] [place], [place] [address]   Culture, blood (routine x 2)     Status: None (Preliminary result)   Collection Time: 09/01/20  7:30 AM   Specimen: BLOOD  Result Value Ref Range Status   Specimen Description BLOOD SITE NOT SPECIFIED  Final   Special Requests   Final    BOTTLES DRAWN AEROBIC AND ANAEROBIC Blood Culture adequate volume   Culture  Setup Time   Final    AEROBIC BOTTLE ONLY GRAM POSITIVE COCCI CRITICAL VALUE NOTED.  VALUE IS CONSISTENT WITH PREVIOUSLY REPORTED AND CALLED VALUE. Performed at [facility], [address] [place], [place] [address]    Culture GRAM POSITIVE COCCI  Final   Report Status PENDING  Incomplete  Culture, blood (routine x 2)     Status: None (Preliminary result)   Collection Time: 09/01/20  7:30 AM   Specimen: BLOOD  Result Value Ref Range Status   Specimen Description BLOOD SITE NOT SPECIFIED  Final   Special Requests   Final    BOTTLES DRAWN AEROBIC AND ANAEROBIC Blood Culture adequate volume   Culture  Setup Time   Final    GRAM POSITIVE COCCI IN BOTH AEROBIC AND ANAEROBIC BOTTLES CRITICAL RESULT CALLED TO, READ BACK BY AND VERIFIED WITH: Melven Sartorius [facility] 09/02/20 0132 JDW Performed at [facility], [address], [place], [place] [address]    Culture GRAM POSITIVE COCCI  Final   Report Status PENDING  Incomplete  Blood Culture ID Panel (Reflexed)     Status: Abnormal   Collection Time: 09/01/20  7:30 AM  Result Value Ref Range Status   Enterococcus faecalis NOT DETECTED NOT DETECTED Final   Enterococcus Faecium NOT DETECTED NOT DETECTED Final   Listeria monocytogenes NOT DETECTED NOT DETECTED Final   Staphylococcus species DETECTED (A) NOT DETECTED Final    Comment: CRITICAL RESULT CALLED TO, READ BACK BY AND VERIFIED WITH: J [facility] PHARMD 09/02/20 0132  JDW  Staphylococcus aureus (BCID) NOT DETECTED NOT DETECTED Final   Staphylococcus epidermidis DETECTED (A) NOT DETECTED Final    Comment: Methicillin (oxacillin) resistant coagulase negative staphylococcus. Possible blood culture contaminant (unless isolated from more than one blood culture draw or clinical case suggests pathogenicity). No antibiotic treatment is indicated for blood  culture contaminants. CRITICAL RESULT CALLED TO, READ BACK BY AND VERIFIED WITH: J [facility] PHARMD 09/02/20 0132 JDW    Staphylococcus lugdunensis NOT DETECTED NOT DETECTED Final   Streptococcus species NOT DETECTED NOT DETECTED Final   Streptococcus agalactiae NOT DETECTED NOT DETECTED Final   Streptococcus pneumoniae NOT DETECTED NOT DETECTED Final   Streptococcus pyogenes NOT DETECTED NOT DETECTED Final   A.calcoaceticus-baumannii NOT DETECTED NOT DETECTED Final   Bacteroides fragilis NOT DETECTED NOT DETECTED Final   Enterobacterales NOT DETECTED NOT DETECTED Final   Enterobacter cloacae complex NOT DETECTED NOT DETECTED Final   Escherichia coli NOT DETECTED NOT DETECTED Final   Klebsiella aerogenes NOT DETECTED NOT DETECTED Final   Klebsiella oxytoca NOT DETECTED NOT DETECTED Final   Klebsiella pneumoniae NOT DETECTED NOT DETECTED Final   Proteus species NOT DETECTED NOT DETECTED Final   Salmonella species NOT DETECTED NOT DETECTED Final   Serratia marcescens NOT DETECTED NOT DETECTED Final   Haemophilus influenzae NOT DETECTED NOT DETECTED Final   Neisseria meningitidis NOT DETECTED NOT DETECTED Final   Pseudomonas aeruginosa NOT DETECTED NOT DETECTED Final   Stenotrophomonas maltophilia NOT DETECTED NOT DETECTED Final   Candida albicans NOT DETECTED NOT DETECTED Final   Candida auris NOT DETECTED NOT DETECTED Final   Candida glabrata NOT DETECTED NOT DETECTED Final   Candida krusei NOT DETECTED NOT DETECTED Final   Candida parapsilosis NOT DETECTED NOT DETECTED Final   Candida tropicalis NOT  DETECTED NOT DETECTED Final   Cryptococcus neoformans/gattii NOT DETECTED NOT DETECTED Final   Methicillin resistance mecA/C DETECTED (A) NOT DETECTED Final    Comment: CRITICAL RESULT CALLED TO, READ BACK BY AND VERIFIED WITHMelven Sartorius [facility] 09/02/20 0132 JDW Performed at [facility], [address] [place], [place] [address]      Medications:   . aspirin  300 mg Rectal Daily   Or  . aspirin  325 mg Oral Daily  . Chlorhexidine Gluconate Cloth  6 each Topical Daily  . divalproex  750 mg Oral QHS  . gabapentin  300 mg Oral BID  . [START ON 09/07/2020] haloperidol decanoate  100 mg Intramuscular Q30 days  . losartan  50 mg Oral Daily  . nicotine  14 mg Transdermal Daily  . tamsulosin  0.8 mg Oral QPC breakfast   Continuous Infusions: . sodium chloride 75 mL/hr at 09/03/20 0354  . vancomycin 750 mg (09/03/20 0356)      LOS: 2 days   Marinda Elk  [facility]  09/03/2020, 10:31 AM

## 2020-09-04 ENCOUNTER — Inpatient Hospital Stay (HOSPITAL_COMMUNITY): Payer: Medicare Other

## 2020-09-04 DIAGNOSIS — D72829 Elevated white blood cell count, unspecified: Secondary | ICD-10-CM

## 2020-09-04 DIAGNOSIS — B955 Unspecified streptococcus as the cause of diseases classified elsewhere: Secondary | ICD-10-CM | POA: Diagnosis not present

## 2020-09-04 DIAGNOSIS — E870 Hyperosmolality and hypernatremia: Secondary | ICD-10-CM

## 2020-09-04 DIAGNOSIS — I10 Essential (primary) hypertension: Secondary | ICD-10-CM

## 2020-09-04 DIAGNOSIS — N179 Acute kidney failure, unspecified: Secondary | ICD-10-CM

## 2020-09-04 DIAGNOSIS — R7881 Bacteremia: Secondary | ICD-10-CM

## 2020-09-04 DIAGNOSIS — R4182 Altered mental status, unspecified: Secondary | ICD-10-CM

## 2020-09-04 DIAGNOSIS — L98499 Non-pressure chronic ulcer of skin of other sites with unspecified severity: Secondary | ICD-10-CM

## 2020-09-04 DIAGNOSIS — R778 Other specified abnormalities of plasma proteins: Secondary | ICD-10-CM

## 2020-09-04 DIAGNOSIS — F17210 Nicotine dependence, cigarettes, uncomplicated: Secondary | ICD-10-CM

## 2020-09-04 DIAGNOSIS — R7401 Elevation of levels of liver transaminase levels: Secondary | ICD-10-CM

## 2020-09-04 DIAGNOSIS — R531 Weakness: Secondary | ICD-10-CM | POA: Diagnosis not present

## 2020-09-04 LAB — BASIC METABOLIC PANEL
Anion gap: 7 (ref 5–15)
BUN: 18 mg/dL (ref 8–23)
CO2: 27 mmol/L (ref 22–32)
Calcium: 8.2 mg/dL — ABNORMAL LOW (ref 8.9–10.3)
Chloride: 105 mmol/L (ref 98–111)
Creatinine, Ser: 0.72 mg/dL (ref 0.61–1.24)
GFR, Estimated: >60 mL/min (ref >60)
Glucose, Bld: 124 mg/dL — ABNORMAL HIGH (ref 70–99)
Potassium: 4.2 mmol/L (ref 3.5–5.1)
Sodium: 139 mmol/L (ref 135–145)

## 2020-09-04 LAB — CULTURE, BLOOD (ROUTINE X 2)
Special Requests: ADEQUATE
Special Requests: ADEQUATE

## 2020-09-04 LAB — CBC WITH DIFFERENTIAL/PLATELET
Abs Immature Granulocytes: 0.39 10*3/uL — ABNORMAL HIGH (ref 0.00–0.07)
Basophils Absolute: 0 10*3/uL (ref 0.0–0.1)
Basophils Relative: 0 %
Eosinophils Absolute: 0.1 10*3/uL (ref 0.0–0.5)
Eosinophils Relative: 2 %
HCT: 34.6 % — ABNORMAL LOW (ref 39.0–52.0)
Hemoglobin: 11.7 g/dL — ABNORMAL LOW (ref 13.0–17.0)
Immature Granulocytes: 5 %
Lymphocytes Relative: 17 %
Lymphs Abs: 1.4 10*3/uL (ref 0.7–4.0)
MCH: 31.3 pg (ref 26.0–34.0)
MCHC: 33.8 g/dL (ref 30.0–36.0)
MCV: 92.5 fL (ref 80.0–100.0)
Monocytes Absolute: 0.5 10*3/uL (ref 0.1–1.0)
Monocytes Relative: 6 %
Neutro Abs: 5.8 10*3/uL (ref 1.7–7.7)
Neutrophils Relative %: 70 %
Platelets: 139 10*3/uL — ABNORMAL LOW (ref 150–400)
RBC: 3.74 MIL/uL — ABNORMAL LOW (ref 4.22–5.81)
RDW: 12.4 % (ref 11.5–15.5)
WBC: 8.2 10*3/uL (ref 4.0–10.5)
nRBC: 0 % (ref 0.0–0.2)

## 2020-09-04 LAB — HEPATIC FUNCTION PANEL
ALT: 371 U/L — ABNORMAL HIGH (ref 0–44)
AST: 98 U/L — ABNORMAL HIGH (ref 15–41)
Albumin: 2.5 g/dL — ABNORMAL LOW (ref 3.5–5.0)
Alkaline Phosphatase: 70 U/L (ref 38–126)
Bilirubin, Direct: <0.1 mg/dL (ref 0.0–0.2)
Total Bilirubin: 0.5 mg/dL (ref 0.3–1.2)
Total Protein: 4.9 g/dL — ABNORMAL LOW (ref 6.5–8.1)

## 2020-09-04 MED ORDER — CEFAZOLIN SODIUM-DEXTROSE 1-4 GM/50ML-% IV SOLN: 1.0000 g | Freq: Three times a day (TID) | INTRAVENOUS | Status: DC

## 2020-09-04 MED ORDER — CEFAZOLIN SODIUM-DEXTROSE 2-4 GM/100ML-% IV SOLN
2.0000 g | Freq: Three times a day (TID) | INTRAVENOUS | Status: DC
  Administered 2020-09-04 – 2020-09-07 (×9): 2 g via INTRAVENOUS
  Filled 2020-09-04 (×11): qty 100

## 2020-09-04 NOTE — Consult Note (Signed)
[facility]  Total days of antibiotics 3               Reason for Consult:staphylococcal species bacteremia    Referring Physician: David Stall  Principal Problem:   Left-sided weakness Active Problems:   Hypertension   Bipolar disorder (HCC)   ARF (acute renal failure) (HCC)   Elevated troponin   Elevated LFTs   Non-ST elevation (NSTEMI) myocardial infarction [facility])   Non-traumatic rhabdomyolysis    HPI: Bryan Davies is a 69 y.o. male with hx of HTN, bipolar disorder who was admitted found down, with left arm weakness, mild hypernatremia, slight aki, and transaminitis in the 400s ,thought to be due to rhabdomyolysis. Arm weakness and AMS, concerning for stroke, but MRI not c/w new infarct. He had admit blood cx which grew staph hominis in 4/4 blood cx. He was started on vancomycin but question to whether this is contaminant vs true bacteremia. abtx were changed to doxycycline for possible cellulitis to chest. His weakness to left arm attributed to full thickness rotator cuff tear, involving supra/infraspinatus and subscapularis tendons. With medial dislocation of bicep tendon. He still has very few recollection to days coming into hospital. He is unclear if he had any fever,chills, nightsweats, no n.v. he does have left ear abrasian and swelling that is new  Past Medical History:  Diagnosis Date  . Anxiety   . Hypertension   . Shingles 2013  . Shoulder pain     Allergies:  Allergies  Allergen Reactions  . Pollen Extract Other (See Comments)    Sneezing     MEDICATIONS: . aspirin  300 mg Rectal Daily   Or  . aspirin  325 mg Oral Daily  . bupivacaine  10 mL Infiltration Once  . Chlorhexidine Gluconate Cloth  6 each Topical Daily  . divalproex  750 mg Oral QHS  . gabapentin  300 mg Oral BID  . [START ON 09/07/2020] haloperidol decanoate  100 mg Intramuscular Q30 days  . losartan  50 mg Oral Daily  . methylPREDNISolone acetate  40 mg  Intra-articular Once  . nicotine  14 mg Transdermal Daily  . tamsulosin  0.8 mg Oral QPC breakfast    Social History   Tobacco Use  . Smoking status: Current Every Day Smoker    Packs/day: 2.00    Years: 50.00    Pack years: 100.00    Types: Cigarettes  . Smokeless tobacco: Never Used  . Tobacco comment: 10  cigarettes per day, waits an hour between  Vaping Use  . Vaping Use: Never used  Substance Use Topics  . Alcohol use: No  . Drug use: No    Family History  Family history unknown: Yes     Review of Systems  Constitutional: Negative for fever, chills, diaphoresis, activity change, appetite change, fatigue and unexpected weight change.  HENT: Negative for congestion, sore throat, rhinorrhea, sneezing, trouble swallowing and sinus pressure.  Eyes: Negative for photophobia and visual disturbance.  Respiratory: Negative for cough, chest tightness, shortness of breath, wheezing and stridor.  Cardiovascular: Negative for chest pain, palpitations and leg swelling.  Gastrointestinal: Negative for nausea, vomiting, abdominal pain, diarrhea, constipation, blood in stool, abdominal distention and anal bleeding.  Genitourinary: Negative for dysuria, hematuria, flank pain and difficulty urinating.  Musculoskeletal: Negative for myalgias, back pain, joint swelling, arthralgias and gait problem.  Skin: +left ear wound Neurological: Negative for dizziness, tremors, weakness and light-headedness.  Hematological: Negative for adenopathy. Does  not bruise/bleed easily.  Psychiatric/Behavioral:+loss of memory up to admission. Negative for behavioral problems, confusion, sleep disturbance, dysphoric mood, decreased concentration and agitation.     OBJECTIVE: Temp:  [97.5 F (36.4 C)-98.2 F (36.8 C)] 98.2 F (36.8 C) (11/07 0820) Pulse Rate:  [88-99] 99 (11/07 1234) Resp:  [13-19] 18 (11/07 1234) BP: (110-162)/(66-80) 122/66 (11/07 1234) SpO2:  [95 %-98 %] 98 % (11/07 1234) Weight:   [72.9 kg] 72.9 kg (11/07 1100) Physical Exam  Constitutional: He is oriented to person, place, and time. He appears well-developed and well-nourished. No distress.  HENT: left ear swelling ,and small abrasive ulcer to helix of ear Mouth/Throat: Oropharynx is clear and moist. No oropharyngeal exudate.  Cardiovascular: Normal rate, regular rhythm and normal heart sounds. Exam reveals no gallop and no friction rub.  No murmur heard.  Pulmonary/Chest: Effort normal and breath sounds normal. No respiratory distress. He has no wheezes.  Abdominal: Soft. Bowel sounds are normal. He exhibits no distension. There is no tenderness.  Lymphadenopathy:  He has no cervical adenopathy.  Neurological: He is alert and oriented to person, place, and time.  Skin: mild blanching rash to chest,no papules Psychiatric: He has a normal mood and affect. His behavior is normal.     LABS: Results for orders placed or performed during the hospital encounter of 08/31/20 (from the past 48 hour(s))  Basic metabolic panel     Status: Abnormal   Collection Time: 09/03/20  2:08 AM  Result Value Ref Range   Sodium 140 135 - 145 mmol/L   Potassium 3.7 3.5 - 5.1 mmol/L   Chloride 107 98 - 111 mmol/L   CO2 26 22 - 32 mmol/L   Glucose, Bld 100 (H) 70 - 99 mg/dL    Comment: Glucose reference range applies only to samples taken after fasting for at least 8 hours.   BUN 34 (H) 8 - 23 mg/dL   Creatinine, Ser 8.67 0.61 - 1.24 mg/dL   Calcium 8.1 (L) 8.9 - 10.3 mg/dL   GFR, Estimated >67 >20 mL/min    Comment: (NOTE) Calculated using the CKD-EPI Creatinine Equation (2021)    Anion gap 7 5 - 15    Comment: Performed at [facility], [address] [place], [place] [address]  CK     Status: Abnormal   Collection Time: 09/03/20  2:08 AM  Result Value Ref Range   Total CK 1,474 (H) 49.0 - 397.0 U/L    Comment: Performed at [facility], [address], [place], [place] [address]  CBC with Differential/Platelet      Status: Abnormal   Collection Time: 09/03/20  2:08 AM  Result Value Ref Range   WBC 8.4 4.0 - 10.5 K/uL   RBC 3.86 (L) 4.22 - 5.81 MIL/uL   Hemoglobin 11.8 (L) 13.0 - 17.0 g/dL   HCT 62.9 (L) 39 - 52 %   MCV 91.5 80.0 - 100.0 fL   MCH 30.6 26.0 - 34.0 pg   MCHC 33.4 30.0 - 36.0 g/dL   RDW 47.6 54.6 - 50.3 %   Platelets 125 (L) 150 - 400 K/uL   nRBC 0.0 0.0 - 0.2 %   Neutrophils Relative % 71 %   Neutro Abs 5.9 1.7 - 7.7 K/uL   Lymphocytes Relative 19 %   Lymphs Abs 1.6 0.7 - 4.0 K/uL   Monocytes Relative 7 %   Monocytes Absolute 0.6 0.1 - 1.0 K/uL   Eosinophils Relative 1 %  Eosinophils Absolute 0.1 0.0 - 0.5 K/uL   Basophils Relative 0 %   Basophils Absolute 0.0 0.0 - 0.1 K/uL   Immature Granulocytes 2 %   Abs Immature Granulocytes 0.18 (H) 0.00 - 0.07 K/uL    Comment: Performed at [facility], [address] [place], [place] [address]  Hepatic function panel     Status: Abnormal   Collection Time: 09/03/20  2:08 AM  Result Value Ref Range   Total Protein 5.2 (L) 6.5 - 8.1 g/dL   Albumin 2.5 (L) 3.5 - 5.0 g/dL   AST 762 (H) 15 - 41 U/L   ALT 663 (H) 0 - 44 U/L   Alkaline Phosphatase 58 38 - 126 U/L   Total Bilirubin 0.7 0.3 - 1.2 mg/dL   Bilirubin, Direct 0.1 0.0 - 0.2 mg/dL   Indirect Bilirubin 0.6 0.3 - 0.9 mg/dL    Comment: Performed at [facility], [address], [place], [place] [address]  Basic metabolic panel     Status: Abnormal   Collection Time: 09/04/20  5:04 AM  Result Value Ref Range   Sodium 139 135 - 145 mmol/L   Potassium 4.2 3.5 - 5.1 mmol/L   Chloride 105 98 - 111 mmol/L   CO2 27 22 - 32 mmol/L   Glucose, Bld 124 (H) 70 - 99 mg/dL    Comment: Glucose reference range applies only to samples taken after fasting for at least 8 hours.   BUN 18 8 - 23 mg/dL   Creatinine, Ser 7.61 0.61 - 1.24 mg/dL   Calcium 8.2 (L) 8.9 - 10.3 mg/dL   GFR, Estimated >60 >73 mL/min    Comment: (NOTE) Calculated using the CKD-EPI Creatinine Equation  (2021)    Anion gap 7 5 - 15    Comment: Performed at [facility], [address] [place], [place] [address]  CBC with Differential/Platelet     Status: Abnormal   Collection Time: 09/04/20  5:04 AM  Result Value Ref Range   WBC 8.2 4.0 - 10.5 K/uL    Comment: WHITE COUNT CONFIRMED ON SMEAR   RBC 3.74 (L) 4.22 - 5.81 MIL/uL   Hemoglobin 11.7 (L) 13.0 - 17.0 g/dL   HCT 69.4 (L) 39 - 52 %   MCV 92.5 80.0 - 100.0 fL   MCH 31.3 26.0 - 34.0 pg   MCHC 33.8 30.0 - 36.0 g/dL   RDW 85.4 62.7 - 03.5 %   Platelets 139 (L) 150 - 400 K/uL   nRBC 0.0 0.0 - 0.2 %   Neutrophils Relative % 70 %   Neutro Abs 5.8 1.7 - 7.7 K/uL   Lymphocytes Relative 17 %   Lymphs Abs 1.4 0.7 - 4.0 K/uL   Monocytes Relative 6 %   Monocytes Absolute 0.5 0.1 - 1.0 K/uL   Eosinophils Relative 2 %   Eosinophils Absolute 0.1 0.0 - 0.5 K/uL   Basophils Relative 0 %   Basophils Absolute 0.0 0.0 - 0.1 K/uL   WBC Morphology MILD LEFT SHIFT (1-5% METAS, OCC MYELO, OCC BANDS)     Comment: VACUOLATED NEUTROPHILS   Immature Granulocytes 5 %   Abs Immature Granulocytes 0.39 (H) 0.00 - 0.07 K/uL    Comment: Performed at [facility], [address] [place], [place] [address]  Culture, blood (routine x 2)     Status: None (Preliminary result)   Collection Time: 09/04/20  5:09 AM   Specimen: BLOOD  Result Value  Ref Range   Specimen Description BLOOD LEFT ANTECUBITAL    Special Requests      BOTTLES DRAWN AEROBIC AND ANAEROBIC Blood Culture adequate volume   Culture      NO GROWTH < 12 HOURS Performed at [facility], [address] [place], [place] [address]    Report Status PENDING   Culture, blood (routine x 2)     Status: None (Preliminary result)   Collection Time: 09/04/20  5:13 AM   Specimen: BLOOD  Result Value Ref Range   Specimen Description BLOOD RIGHT ANTECUBITAL    Special Requests      BOTTLES DRAWN AEROBIC AND ANAEROBIC Blood Culture adequate volume   Culture      NO GROWTH < 12  HOURS Performed at [facility], [address] [place], [place] [address]    Report Status PENDING     MICRO: 11/7 blood cx pending 11/4 blood cx staph hominis oxa S IMAGING: MR BRAIN WO CONTRAST  Result Date: 09/02/2020 CLINICAL DATA:  Stroke, follow-up. EXAM: MRI HEAD WITHOUT CONTRAST TECHNIQUE: Multiplanar, multiecho pulse sequences of the brain and surrounding structures were obtained without intravenous contrast. COMPARISON:  Brain MRI 09/01/2020. FINDINGS: An abbreviated protocol brain was performed at the ordering provider's request. Only axial and coronal diffusion-weighted sequences and an axial SWI sequence were obtained. There is no evidence of acute infarction. Apparent punctate focus of restricted diffusion within the right cerebellar hemisphere appreciated on the axial DWI sequence only (series 5, image 68). This finding is not appreciated on the coronal DWI sequence and likely reflects image noise. Otherwise, there is no appreciable interval change as compared to the brain MRI of 09/01/2020. 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 Chronic hemosiderin staining at this site. Remote right thalamic lacunar infarct. Generalized cerebral atrophy. IMPRESSION: Abbreviated protocol brain MRI consisting of only diffusion-weighted and SWI sequences. No evidence of acute infarction. Electronically Signed   By: Jackey LogeKyle  Golden DO   On: 09/02/2020 18:25   MR ABDOMEN W WO CONTRAST  Result Date: 09/03/2020 CLINICAL DATA:  Evaluate kidney mass EXAM: MRI ABDOMEN WITHOUT AND WITH CONTRAST TECHNIQUE: Multiplanar multisequence MR imaging of the abdomen was performed both before and after the administration of intravenous contrast. CONTRAST:  6.715mL GADAVIST GADOBUTROL 1 MMOL/ML IV SOLN COMPARISON:  CT AP 09/01/2020 FINDINGS: Lower chest: No acute findings. Hepatobiliary: No suspicious liver abnormality identified. Stone within the gallbladder neck measures 1 cm, image  17/5. No signs of gallbladder wall inflammation or biliary ductal dilatation. Pancreas:  No main duct dilatation, inflammation or mass. Spleen:  Within normal limits in size and appearance. Adrenals/Urinary Tract:  Adrenal glands are unremarkable. Horseshoe kidney identified. Corresponding to the CT abnormality is a well-circumscribed T2 hyperintense lesion arising off the anteromedial cortex of the left kidney measuring 1.1 x 0.9 cm. No internal septation or mural nodule noted. On the postcontrast images this lesion appears blurred by respiratory motion artifact limiting evaluation for evidence of internal enhancement/complexity. No additional focal kidney lesions identified. Mild bilateral perinephric fat stranding is identified which is unchanged from previous exam and may be related to prior insult. No hydronephrosis. Stomach/Bowel: Visualized portions within the abdomen are unremarkable. Vascular/Lymphatic:  No aneurysm.  No abdominal adenopathy. Other:  No significant free fluid or fluid collections. Musculoskeletal: No suspicious bone lesions identified. Note: Diminished exam detail secondary to respiratory motion artifact. IMPRESSION: 1. Diminished exam detail due to respiratory motion artifact. 2. Horseshoe kidney. 3. Corresponding to the CT abnormality is  a unilocular, uniformly T2 hyperintense cyst arising off the anteromedial cortex of the left kidney. No signs of internal complexity on the precontrast T2 weighted images. This is obscured on the postcontrast imaging limiting assessment for internal enhancement. Given the small size of this lesion and the uniformly T2 hyperintense this is favored to represent a simple cyst but is technically incompletely characterized. Consider follow-up imaging and 6 to 12 months with renal protocol MRI or CT without and with contrast material. In a patient who may have difficulty remaining motionless, CT may be more appropriate as this is less susceptible to motion  artifact. 4. Horseshoe kidney. 5. Gallstone. Electronically Signed   By: Signa Kell M.D.   On: 09/03/2020 04:51   MR SHOULDER LEFT WO CONTRAST  Result Date: 09/04/2020 CLINICAL DATA:  Left shoulder pain and weakness. EXAM: MRI OF THE LEFT SHOULDER WITHOUT CONTRAST TECHNIQUE: Multiplanar, multisequence MR imaging of the shoulder was performed. No intravenous contrast was administered. COMPARISON:  Left shoulder x-rays dated September 01, 2020. FINDINGS: Despite efforts by the technologist and patient, motion artifact is present on today's exam and could not be eliminated. This severely reduces exam sensitivity and specificity. Rotator cuff: Full-thickness, full width tears of the supraspinatus, infraspinatus, and subscapularis tendons. The teres minor is intact. Muscles: Moderate subscapularis and mild infraspinatus muscle atrophy. Biceps long head:  Appears intact, but is medially dislocated. Acromioclavicular Joint: Mild arthropathy of the acromioclavicular joint. Type II acromion. Moderate amount of subacromial/subdeltoid bursal fluid. Glenohumeral Joint: Small joint effusion. Scattered mild partial-thickness cartilage loss. Labrum:  Grossly intact. Bones: High-riding humeral head. No acute fracture or dislocation. No suspicious bone lesion. Other: None. IMPRESSION: 1. Severely limited study due to motion artifact. 2. Massive rotator cuff tear. Full-thickness, full width tears of the supraspinatus, infraspinatus, and subscapularis tendons. Moderate subscapularis and mild infraspinatus muscle atrophy. 3. Medial dislocation of the biceps tendon. 4. Mild acromioclavicular and glenohumeral osteoarthritis. Electronically Signed   By: Obie Dredge M.D.   On: 09/04/2020 09:09    HISTORICAL MICRO/IMAGING  Assessment/Plan:  69yo M with AMS, found down at home admitted with leukocytosis, rhabdomyolysis, left arm weakness that is thought to be due to rotator cuff tear but incidentally found to have  streptococcal bacteremia, on doxycycline for cellulitis, his blood work today suggests left shift, which would see with infections. At this time, would recommend to treat bacteremia and treat either as simple bacteremia unless repeat blood cx become positive -then would pursue TEE. His TTE was unrevealing for vegetations.  Recommend to change to cefazolin 2gm iv q 8hr Repeat blood cx from today have been collected Source of bacteremia is unclear, if secondary to cellulitis  Ear ulcer = possibly due to pressure (if he was down lay on left side), continue to monitor for improvement. No signs of purulent drainage at this time.  Health maintenance = recommend covid vaccine prior to discharge

## 2020-09-04 NOTE — Progress Notes (Signed)
Subjective:    Patient states he has had some improvement in left shoulder pain since injection. Denies paraesthesias or other joint pain.   Objective:  PE: VITALS:   Vitals:   09/04/20 0820 09/04/20 1100 09/04/20 1234 09/04/20 1732  BP: 120/68  122/66 120/60  Pulse: 97  99 95  Resp: 18 19 18 18   Temp: 98.2 F (36.8 C)     TempSrc:      SpO2: 97%  98% 99%  Weight:  72.9 kg     General: alert, in no acute distress MSK: No edema or erythema of left shoulder. 0-25 degrees of active forward flexion of left shoulder. 0-90 degrees of PROM. 10 degrees of active external rotation. difficultly with active extension at elbow, but passive extension does not create pain. Sensation intact to lateral shoulder. + radial pulse. Sensation intact to median, radial, and ulnar nerves. Able to flex, extend, and abduct all fingers of left hand though noted flexion contracture of 4th and 5th fingers.   LABS  Results for orders placed or performed during the hospital encounter of 08/31/20 (from the past 24 hour(s))  Basic metabolic panel     Status: Abnormal   Collection Time: 09/04/20  5:04 AM  Result Value Ref Range   Sodium 139 135 - 145 mmol/L   Potassium 4.2 3.5 - 5.1 mmol/L   Chloride 105 98 - 111 mmol/L   CO2 27 22 - 32 mmol/L   Glucose, Bld 124 (H) 70 - 99 mg/dL   BUN 18 8 - 23 mg/dL   Creatinine, Ser 13/07/21 0.61 - 1.24 mg/dL   Calcium 8.2 (L) 8.9 - 10.3 mg/dL   GFR, Estimated 1.93 >79 mL/min   Anion gap 7 5 - 15  CBC with Differential/Platelet     Status: Abnormal   Collection Time: 09/04/20  5:04 AM  Result Value Ref Range   WBC 8.2 4.0 - 10.5 K/uL   RBC 3.74 (L) 4.22 - 5.81 MIL/uL   Hemoglobin 11.7 (L) 13.0 - 17.0 g/dL   HCT 13/07/21 (L) 39 - 52 %   MCV 92.5 80.0 - 100.0 fL   MCH 31.3 26.0 - 34.0 pg   MCHC 33.8 30.0 - 36.0 g/dL   RDW 40.9 73.5 - 32.9 %   Platelets 139 (L) 150 - 400 K/uL   nRBC 0.0 0.0 - 0.2 %   Neutrophils Relative % 70 %   Neutro Abs 5.8 1.7 - 7.7 K/uL    Lymphocytes Relative 17 %   Lymphs Abs 1.4 0.7 - 4.0 K/uL   Monocytes Relative 6 %   Monocytes Absolute 0.5 0.1 - 1.0 K/uL   Eosinophils Relative 2 %   Eosinophils Absolute 0.1 0.0 - 0.5 K/uL   Basophils Relative 0 %   Basophils Absolute 0.0 0.0 - 0.1 K/uL   WBC Morphology MILD LEFT SHIFT (1-5% METAS, OCC MYELO, OCC BANDS)    Immature Granulocytes 5 %   Abs Immature Granulocytes 0.39 (H) 0.00 - 0.07 K/uL  Culture, blood (routine x 2)     Status: None (Preliminary result)   Collection Time: 09/04/20  5:09 AM   Specimen: BLOOD  Result Value Ref Range   Specimen Description BLOOD LEFT ANTECUBITAL    Special Requests      BOTTLES DRAWN AEROBIC AND ANAEROBIC Blood Culture adequate volume   Culture      NO GROWTH < 12 HOURS Performed at [facility], [address], [place], [place]  Report Status PENDING   Culture, blood (routine x 2)     Status: None (Preliminary result)   Collection Time: 09/04/20  5:13 AM   Specimen: BLOOD  Result Value Ref Range   Specimen Description BLOOD RIGHT ANTECUBITAL    Special Requests      BOTTLES DRAWN AEROBIC AND ANAEROBIC Blood Culture adequate volume   Culture      NO GROWTH < 12 HOURS Performed at [facility], [address] [place], [place] [address]    Report Status PENDING   Hepatic function panel     Status: Abnormal   Collection Time: 09/04/20  3:17 PM  Result Value Ref Range   Total Protein 4.9 (L) 6.5 - 8.1 g/dL   Albumin 2.5 (L) 3.5 - 5.0 g/dL   AST 98 (H) 15 - 41 U/L   ALT 371 (H) 0 - 44 U/L   Alkaline Phosphatase 70 38 - 126 U/L   Total Bilirubin 0.5 0.3 - 1.2 mg/dL   Bilirubin, Direct <6.2 0.0 - 0.2 mg/dL   Indirect Bilirubin NOT CALCULATED 0.3 - 0.9 mg/dL    MR SHOULDER LEFT WO CONTRAST  Result Date: 09/04/2020 CLINICAL DATA:  Left shoulder pain and weakness. EXAM: MRI OF THE LEFT SHOULDER WITHOUT CONTRAST TECHNIQUE: Multiplanar, multisequence MR imaging of the shoulder was performed. No intravenous  contrast was administered. COMPARISON:  Left shoulder x-rays dated September 01, 2020. FINDINGS: Despite efforts by the technologist and patient, motion artifact is present on today's exam and could not be eliminated. This severely reduces exam sensitivity and specificity. Rotator cuff: Full-thickness, full width tears of the supraspinatus, infraspinatus, and subscapularis tendons. The teres minor is intact. Muscles: Moderate subscapularis and mild infraspinatus muscle atrophy. Biceps long head:  Appears intact, but is medially dislocated. Acromioclavicular Joint: Mild arthropathy of the acromioclavicular joint. Type II acromion. Moderate amount of subacromial/subdeltoid bursal fluid. Glenohumeral Joint: Small joint effusion. Scattered mild partial-thickness cartilage loss. Labrum:  Grossly intact. Bones: High-riding humeral head. No acute fracture or dislocation. No suspicious bone lesion. Other: None. IMPRESSION: 1. Severely limited study due to motion artifact. 2. Massive rotator cuff tear. Full-thickness, full width tears of the supraspinatus, infraspinatus, and subscapularis tendons. Moderate subscapularis and mild infraspinatus muscle atrophy. 3. Medial dislocation of the biceps tendon. 4. Mild acromioclavicular and glenohumeral osteoarthritis. Electronically Signed   By: Obie Dredge M.D.   On: 09/04/2020 09:09    Assessment/Plan:  Left shoulder rotator cuff arthropathy: - MRI ordered shows massive rotator cuff tear, with medial dislocation of biceps, mild AC and glenohumeral osteoarthritis - injection provided some pain relief and did have slightly improved motion today - patient to follow-up with Dr. Dion Saucier after discharge for possible surgical planning when medical status improves - ok to continue PT and OT involving LUE shoulder as patient can tolerate  Armida Sans 09/04/2020, 8:04 PM

## 2020-09-04 NOTE — Progress Notes (Signed)
[facility] PROGRESS NOTE    Progress Note  Bryan Davies  WGN:[id number] DOB: Mar 18, 1951 DOA: 08/31/2020 PCP: Gean BirchwoodPa, [facility]     Brief Narrative:   Bryan Davies is an 69 y.o. male past medical history of stroke in 01/16/2019 status post carotid stenting, essential hypertension ongoing tobacco abuse bipolar disorder brought into the ED as he was found in the floor confused and weak on the left side by his girlfriend  Assessment/Plan:   Left-sided weakness dueto a rotator cuff tear: Physical therapy evaluated the patient and recommended skilled nursing facility. Neurology was consulted as there was high suspicion for CVA MRI was negative cardiology recommended to continue statin and aspirin. Orthopedic surgery was consulted who performed steroid injection which she relates he feels better. They obtained an MRI that showed massive rotator cuff with full tear of supraspinatus infraspinatus and subscapularis, moderate atrophy of subscapularis medial dislocation of the bicep tendon Further management per orthopedic surgery awaiting recommendations.  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 We have consulted ID, surveillance blood culturescultures are pending. 2D echo no Vegetation.  Acute kidney injury/in the setting of nontraumatic rhabdomyolysis: Baseline creatinine of around 1, on admission 1.4. Creatinine returned to baseline after IV fluid hydration his CK is now below 1500 we will KVO IV fluids. MRI of kidney that showed a simple cyst radiology recommended to repeat an MRI in 6 to 12 months.  Elevated troponins: Twelve-lead EKG showed some ST segment elevation V2 through V4. Likely due to rhabdomyolysis.  Elevated LFTs: Likely due to rhabdomyolysis. Hepatitis panel is negative HIV is nonreactive. Repeat hepatic function panel today.  Acute encephalopathy: His ammonia level was elevated, has no history of liver disease. Depakote  level is 11, resume depakote  History of bipolar disorder: Resume Depakote.  History of essential hypertension: On IV hydralazine  BPH: On CT scan there that showed a enlarged prostate, insert a Foley start on Flomax  Cellulitis of the the chest wall and ear: Ancef should cover.   DVT prophylaxis: lovenxo Family Communication:none Status is: Inpatient  Remains inpatient appropriate because:Hemodynamically unstable   Dispo: The patient is from: Home              Anticipated d/c is to: SNF              Anticipated d/c date is: > 3 days              Patient currently is not medically stable to d/c. Code Status:     Code Status Orders  (From admission, onward)         Start     Ordered   09/01/20 0204  Full code  Continuous        09/01/20 0206        Code Status History    Date Active Date Inactive Code Status Order ID Comments User Context   05/27/2019 1621 06/01/2019 1600 Full Code [id number]  Talmage NapRankin, Shuvon B, NP Inpatient   05/26/2019 2356 05/27/2019 1537 Full Code [id number]  Ward, Layla MawKristen N, DO ED   05/03/2019 0250 05/04/2019 2026 Full Code [id number]  Dione BoozeGlick, David, MD ED   01/15/2019 2115 01/18/2019 1754 Full Code [id number]  Aroor, Dara LordsSushanth R, MD ED   Advance Care Planning Activity        IV Access:    Peripheral IV   Procedures and diagnostic studies:   MR BRAIN WO CONTRAST  Result Date: 09/02/2020 CLINICAL DATA:  Stroke,  follow-up. EXAM: MRI HEAD WITHOUT CONTRAST TECHNIQUE: Multiplanar, multiecho pulse sequences of the brain and surrounding structures were obtained without intravenous contrast. COMPARISON:  Brain MRI 09/01/2020. FINDINGS: An abbreviated protocol brain was performed at the ordering provider's request. Only axial and coronal diffusion-weighted sequences and an axial SWI sequence were obtained. There is no evidence of acute infarction. Apparent punctate focus of restricted diffusion within the right cerebellar hemisphere appreciated on the axial DWI  sequence only (series 5, image 68). This finding is not appreciated on the coronal DWI sequence and likely reflects image noise. Otherwise, there is no appreciable interval change as compared to the brain MRI of 09/01/2020. 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 Chronic hemosiderin staining at this site. Remote right thalamic lacunar infarct. Generalized cerebral atrophy. IMPRESSION: Abbreviated protocol brain MRI consisting of only diffusion-weighted and SWI sequences. No evidence of acute infarction. Electronically Signed   By: Jackey Loge DO   On: 09/02/2020 18:25   MR ABDOMEN W WO CONTRAST  Result Date: 09/03/2020 CLINICAL DATA:  Evaluate kidney mass EXAM: MRI ABDOMEN WITHOUT AND WITH CONTRAST TECHNIQUE: Multiplanar multisequence MR imaging of the abdomen was performed both before and after the administration of intravenous contrast. CONTRAST:  6.50mL GADAVIST GADOBUTROL 1 MMOL/ML IV SOLN COMPARISON:  CT AP 09/01/2020 FINDINGS: Lower chest: No acute findings. Hepatobiliary: No suspicious liver abnormality identified. Stone within the gallbladder neck measures 1 cm, image 17/5. No signs of gallbladder wall inflammation or biliary ductal dilatation. Pancreas:  No main duct dilatation, inflammation or mass. Spleen:  Within normal limits in size and appearance. Adrenals/Urinary Tract:  Adrenal glands are unremarkable. Horseshoe kidney identified. Corresponding to the CT abnormality is a well-circumscribed T2 hyperintense lesion arising off the anteromedial cortex of the left kidney measuring 1.1 x 0.9 cm. No internal septation or mural nodule noted. On the postcontrast images this lesion appears blurred by respiratory motion artifact limiting evaluation for evidence of internal enhancement/complexity. No additional focal kidney lesions identified. Mild bilateral perinephric fat stranding is identified which is unchanged from previous exam and may be related to prior insult. No  hydronephrosis. Stomach/Bowel: Visualized portions within the abdomen are unremarkable. Vascular/Lymphatic:  No aneurysm.  No abdominal adenopathy. Other:  No significant free fluid or fluid collections. Musculoskeletal: No suspicious bone lesions identified. Note: Diminished exam detail secondary to respiratory motion artifact. IMPRESSION: 1. Diminished exam detail due to respiratory motion artifact. 2. Horseshoe kidney. 3. Corresponding to the CT abnormality is a unilocular, uniformly T2 hyperintense cyst arising off the anteromedial cortex of the left kidney. No signs of internal complexity on the precontrast T2 weighted images. This is obscured on the postcontrast imaging limiting assessment for internal enhancement. Given the small size of this lesion and the uniformly T2 hyperintense this is favored to represent a simple cyst but is technically incompletely characterized. Consider follow-up imaging and 6 to 12 months with renal protocol MRI or CT without and with contrast material. In a patient who may have difficulty remaining motionless, CT may be more appropriate as this is less susceptible to motion artifact. 4. Horseshoe kidney. 5. Gallstone. Electronically Signed   By: Signa Kell M.D.   On: 09/03/2020 04:51   MR SHOULDER LEFT WO CONTRAST  Result Date: 09/04/2020 CLINICAL DATA:  Left shoulder pain and weakness. EXAM: MRI OF THE LEFT SHOULDER WITHOUT CONTRAST TECHNIQUE: Multiplanar, multisequence MR imaging of the shoulder was performed. No intravenous contrast was administered. COMPARISON:  Left shoulder x-rays dated September 01, 2020. FINDINGS: Despite efforts by the  technologist and patient, motion artifact is present on today's exam and could not be eliminated. This severely reduces exam sensitivity and specificity. Rotator cuff: Full-thickness, full width tears of the supraspinatus, infraspinatus, and subscapularis tendons. The teres minor is intact. Muscles: Moderate subscapularis and mild  infraspinatus muscle atrophy. Biceps long head:  Appears intact, but is medially dislocated. Acromioclavicular Joint: Mild arthropathy of the acromioclavicular joint. Type II acromion. Moderate amount of subacromial/subdeltoid bursal fluid. Glenohumeral Joint: Small joint effusion. Scattered mild partial-thickness cartilage loss. Labrum:  Grossly intact. Bones: High-riding humeral head. No acute fracture or dislocation. No suspicious bone lesion. Other: None. IMPRESSION: 1. Severely limited study due to motion artifact. 2. Massive rotator cuff tear. Full-thickness, full width tears of the supraspinatus, infraspinatus, and subscapularis tendons. Moderate subscapularis and mild infraspinatus muscle atrophy. 3. Medial dislocation of the biceps tendon. 4. Mild acromioclavicular and glenohumeral osteoarthritis. Electronically Signed   By: Obie Dredge M.D.   On: 09/04/2020 09:09     Medical Consultants:    None.  Anti-Infectives:   none  Subjective:    Bryan Davies relates his shoulder pain is better he is hungry. Objective:    Vitals:   09/04/20 0005 09/04/20 0356 09/04/20 0452 09/04/20 0820  BP: (!) 146/67  (!) 162/80 120/68  Pulse: 95  92 97  Resp: Temp: (!) 97.5 F (36.4 C)  97.7 F (36.5 C) 98.2 F (36.8 C)  TempSrc: Oral  Oral   SpO2: 95%  96% 97%   SpO2: 97 %   Intake/Output Summary (Last 24 hours) at 09/04/2020 1003 Last data filed at 09/04/2020 0531 Gross per 24 hour  Intake 480 ml  Output 3600 ml  Net -3120 ml   There were no vitals filed for this visit.  Exam: General exam: In no acute distress. Respiratory system: Good air movement and clear to auscultation. Cardiovascular system: S1 & S2 heard, RRR. No JVD. Gastrointestinal system: Abdomen is nondistended, soft and nontender.  Extremities: No pedal edema. Skin: Weird pustular rash in his chest wall and ear  Data Reviewed:    Labs: Basic Metabolic Panel: Recent Labs  Lab 09/01/20 0203  09/01/20 0203 09/01/20 1244 09/01/20 1244 09/02/20 0157 09/02/20 0157 09/03/20 0208 09/04/20 0504  NA 149*  --  147*  --  138  --  140 139  K 3.9   < > 3.9   < > 3.6   < > 3.7 4.2  CL 111  --  110  --  105  --  107 105  CO2 26  --  27  --  25  --  26 27  GLUCOSE 113*  --  171*  --  114*  --  100* 124*  BUN 76*  --  79*  --  62*  --  34* 18  CREATININE 1.40*  --  1.42*  --  1.13  --  0.98 0.72  CALCIUM 9.3  --  9.0  --  8.5*  --  8.1* 8.2*   < > = values in this interval not displayed.   GFR CrCl cannot be calculated (Unknown ideal weight.). Liver Function Tests: Recent Labs  Lab 08/31/20 2034 09/01/20 0203 09/03/20 0208  AST 388* 477* 242*  ALT 471* 614* 663*  ALKPHOS 73 72 58  BILITOT 1.8* 1.7* 0.7  PROT 7.2 6.8 5.2*  ALBUMIN 3.7 3.6 2.5*   No results for input(s): LIPASE, AMYLASE in the last 168 hours. Recent Labs  Lab 08/31/20 2038  09/01/20 0730  AMMONIA 62* 37*   Coagulation profile Recent Labs  Lab 09/01/20 0203  INR 1.2   COVID-19 Labs  No results for input(s): DDIMER, FERRITIN, LDH, CRP in the last 72 hours.  Lab Results  Component Value Date   SARSCOV2NAA NEGATIVE 08/31/2020   SARSCOV2NAA NEGATIVE 09/12/2019   SARSCOV2NAA NEGATIVE 05/03/2019    CBC: Recent Labs  Lab 08/31/20 2034 08/31/20 2247 09/01/20 0203 09/03/20 0208 09/04/20 0504  WBC 19.4*  --  18.4* 8.4 8.2  NEUTROABS 17.0*  --  16.2* 5.9 5.8  HGB 16.5 12.9* 15.3 11.8* 11.7*  HCT 48.8 38.0* 45.0 35.3* 34.6*  MCV 91.4  --  90.5 91.5 92.5  PLT 212  --  204 125* 139*   Cardiac Enzymes: Recent Labs  Lab 09/01/20 0134 09/02/20 0157 09/03/20 0208  CKTOTAL 4,614* 3,065* 1,474*   BNP (last 3 results) No results for input(s): PROBNP in the last 8760 hours. CBG: Recent Labs  Lab 08/31/20 2024  GLUCAP 178*   D-Dimer: No results for input(s): DDIMER in the last 72 hours. Hgb A1c: No results for input(s): HGBA1C in the last 72 hours. Lipid Profile: No results for input(s):  CHOL, HDL, LDLCALC, TRIG, CHOLHDL, LDLDIRECT in the last 72 hours. Thyroid function studies: No results for input(s): TSH, T4TOTAL, T3FREE, THYROIDAB in the last 72 hours.  Invalid input(s): FREET3 Anemia work up: No results for input(s): VITAMINB12, FOLATE, FERRITIN, TIBC, IRON, RETICCTPCT in the last 72 hours. Sepsis Labs: Recent Labs  Lab 08/31/20 2034 09/01/20 0203 09/03/20 0208 09/04/20 0504  WBC 19.4* 18.4* 8.4 8.2   Microbiology Recent Results (from the past 240 hour(s))  Respiratory Panel by RT PCR (Flu A&B, Covid) - Nasopharyngeal Swab     Status: None   Collection Time: 08/31/20  8:40 PM   Specimen: Nasopharyngeal Swab  Result Value Ref Range Status   SARS Coronavirus 2 by RT PCR NEGATIVE NEGATIVE Final    Comment: (NOTE) SARS-CoV-2 target nucleic acids are NOT DETECTED.  The SARS-CoV-2 RNA is generally detectable in upper respiratoy specimens during the acute phase of infection. The lowest concentration of SARS-CoV-2 viral copies this assay can detect is 131 copies/mL. A negative result does not preclude SARS-Cov-2 infection and should not be used as the sole basis for treatment or other patient management decisions. A negative result may occur with  improper specimen collection/handling, submission of specimen other than nasopharyngeal swab, presence of viral mutation(s) within the areas targeted by this assay, and inadequate number of viral copies (<131 copies/mL). A negative result must be combined with clinical observations, patient history, and epidemiological information. The expected result is Negative.  Fact Sheet for Patients:  https://www.moore.com/  Fact Sheet for Healthcare Providers:  https://www.young.biz/  This test is no t yet approved or cleared by the [place] FDA and  has been authorized for detection and/or diagnosis of SARS-CoV-2 by FDA under an Emergency Use Authorization (EUA). This EUA will  remain  in effect (meaning this test can be used) for the duration of the COVID-19 declaration under Section 564(b)(1) of the Act, 21 U.S.C. section 360bbb-3(b)(1), unless the authorization is terminated or revoked sooner.     Influenza A by PCR NEGATIVE NEGATIVE Final   Influenza B by PCR NEGATIVE NEGATIVE Final    Comment: (NOTE) The Xpert Xpress SARS-CoV-2/FLU/RSV assay is intended as an aid in  the diagnosis of influenza from Nasopharyngeal swab specimens and  should not be used as a sole basis for treatment. Nasal washings  and  aspirates are unacceptable for Xpert Xpress SARS-CoV-2/FLU/RSV  testing.  Fact Sheet for Patients: https://www.moore.com/  Fact Sheet for Healthcare Providers: https://www.young.biz/  This test is not yet approved or cleared by the [place] FDA and  has been authorized for detection and/or diagnosis of SARS-CoV-2 by  FDA under an Emergency Use Authorization (EUA). This EUA will remain  in effect (meaning this test can be used) for the duration of the  Covid-19 declaration under Section 564(b)(1) of the Act, 21  U.S.C. section 360bbb-3(b)(1), unless the authorization is  terminated or revoked. Performed at [facility], [address] [place], [place] [address]   Culture, blood (routine x 2)     Status: Abnormal   Collection Time: 09/01/20  7:30 AM   Specimen: BLOOD  Result Value Ref Range Status   Specimen Description BLOOD SITE NOT SPECIFIED  Final   Special Requests   Final    BOTTLES DRAWN AEROBIC AND ANAEROBIC Blood Culture adequate volume   Culture  Setup Time   Final    AEROBIC BOTTLE ONLY GRAM POSITIVE COCCI CRITICAL VALUE NOTED.  VALUE IS CONSISTENT WITH PREVIOUSLY REPORTED AND CALLED VALUE.    Culture (A)  Final    STAPHYLOCOCCUS HOMINIS SUSCEPTIBILITIES PERFORMED ON PREVIOUS CULTURE WITHIN THE LAST 5 DAYS. Performed at [facility], [address] [place], [place] [address]     Report Status 09/04/2020 FINAL  Final  Culture, blood (routine x 2)     Status: Abnormal   Collection Time: 09/01/20  7:30 AM   Specimen: BLOOD  Result Value Ref Range Status   Specimen Description BLOOD SITE NOT SPECIFIED  Final   Special Requests   Final    BOTTLES DRAWN AEROBIC AND ANAEROBIC Blood Culture adequate volume   Culture  Setup Time   Final    GRAM POSITIVE COCCI IN BOTH AEROBIC AND ANAEROBIC BOTTLES CRITICAL RESULT CALLED TO, READ BACK BY AND VERIFIED WITH: Melven Sartorius [facility] 09/02/20 0132 JDW Performed at [facility], [address] [place], [place] [address]    Culture STAPHYLOCOCCUS HOMINIS (A)  Final   Report Status 09/04/2020 FINAL  Final   Organism ID, Bacteria STAPHYLOCOCCUS HOMINIS  Final      Susceptibility   Staphylococcus hominis - MIC*    CIPROFLOXACIN <=0.5 SENSITIVE Sensitive     ERYTHROMYCIN >=8 RESISTANT Resistant     GENTAMICIN <=0.5 SENSITIVE Sensitive     OXACILLIN <=0.25 SENSITIVE Sensitive     TETRACYCLINE <=1 SENSITIVE Sensitive     VANCOMYCIN <=0.5 SENSITIVE Sensitive     TRIMETH/SULFA <=10 SENSITIVE Sensitive     CLINDAMYCIN <=0.25 SENSITIVE Sensitive     RIFAMPIN <=0.5 SENSITIVE Sensitive     Inducible Clindamycin NEGATIVE Sensitive     * STAPHYLOCOCCUS HOMINIS  Blood Culture ID Panel (Reflexed)     Status: Abnormal   Collection Time: 09/01/20  7:30 AM  Result Value Ref Range Status   Enterococcus faecalis NOT DETECTED NOT DETECTED Final   Enterococcus Faecium NOT DETECTED NOT DETECTED Final   Listeria monocytogenes NOT DETECTED NOT DETECTED Final   Staphylococcus species DETECTED (A) NOT DETECTED Final    Comment: CRITICAL RESULT CALLED TO, READ BACK BY AND VERIFIED WITH: J LEDFORD PHARMD 09/02/20 0132 JDW    Staphylococcus aureus (BCID) NOT DETECTED NOT DETECTED Final   Staphylococcus epidermidis DETECTED (A) NOT DETECTED Final    Comment: Methicillin (oxacillin) resistant coagulase negative staphylococcus. Possible blood  culture contaminant (unless isolated from  more than one blood culture draw or clinical case suggests pathogenicity). No antibiotic treatment is indicated for blood  culture contaminants. CRITICAL RESULT CALLED TO, READ BACK BY AND VERIFIED WITH: J LEDFORD PHARMD 09/02/20 0132 JDW    Staphylococcus lugdunensis NOT DETECTED NOT DETECTED Final   Streptococcus species NOT DETECTED NOT DETECTED Final   Streptococcus agalactiae NOT DETECTED NOT DETECTED Final   Streptococcus pneumoniae NOT DETECTED NOT DETECTED Final   Streptococcus pyogenes NOT DETECTED NOT DETECTED Final   A.calcoaceticus-baumannii NOT DETECTED NOT DETECTED Final   Bacteroides fragilis NOT DETECTED NOT DETECTED Final   Enterobacterales NOT DETECTED NOT DETECTED Final   Enterobacter cloacae complex NOT DETECTED NOT DETECTED Final   Escherichia coli NOT DETECTED NOT DETECTED Final   Klebsiella aerogenes NOT DETECTED NOT DETECTED Final   Klebsiella oxytoca NOT DETECTED NOT DETECTED Final   Klebsiella pneumoniae NOT DETECTED NOT DETECTED Final   Proteus species NOT DETECTED NOT DETECTED Final   Salmonella species NOT DETECTED NOT DETECTED Final   Serratia marcescens NOT DETECTED NOT DETECTED Final   Haemophilus influenzae NOT DETECTED NOT DETECTED Final   Neisseria meningitidis NOT DETECTED NOT DETECTED Final   Pseudomonas aeruginosa NOT DETECTED NOT DETECTED Final   Stenotrophomonas maltophilia NOT DETECTED NOT DETECTED Final   Candida albicans NOT DETECTED NOT DETECTED Final   Candida auris NOT DETECTED NOT DETECTED Final   Candida glabrata NOT DETECTED NOT DETECTED Final   Candida krusei NOT DETECTED NOT DETECTED Final   Candida parapsilosis NOT DETECTED NOT DETECTED Final   Candida tropicalis NOT DETECTED NOT DETECTED Final   Cryptococcus neoformans/gattii NOT DETECTED NOT DETECTED Final   Methicillin resistance mecA/C DETECTED (A) NOT DETECTED Final    Comment: CRITICAL RESULT CALLED TO, READ BACK BY AND VERIFIED  WITHMelven Sartorius [facility] 09/02/20 0132 JDW Performed at [facility], [address] [place], [place] [address]      Medications:   . aspirin  300 mg Rectal Daily   Or  . aspirin  325 mg Oral Daily  . bupivacaine  10 mL Infiltration Once  . Chlorhexidine Gluconate Cloth  6 each Topical Daily  . divalproex  750 mg Oral QHS  . gabapentin  300 mg Oral BID  . [START ON 09/07/2020] haloperidol decanoate  100 mg Intramuscular Q30 days  . losartan  50 mg Oral Daily  . methylPREDNISolone acetate  40 mg Intra-articular Once  . nicotine  14 mg Transdermal Daily  . tamsulosin  0.8 mg Oral QPC breakfast   Continuous Infusions: . doxycycline (VIBRAMYCIN) IV 100 mg (09/04/20 0000)      LOS: 3 days   Marinda Elk  [facility]  09/04/2020, 10:03 AM

## 2020-09-05 ENCOUNTER — Encounter (HOSPITAL_COMMUNITY): Payer: Medicare Other | Admitting: Psychiatry

## 2020-09-05 MED ORDER — ATORVASTATIN CALCIUM 40 MG PO TABS
40.0000 mg | ORAL_TABLET | Freq: Every day | ORAL | Status: DC
  Administered 2020-09-05 – 2020-09-07 (×3): 40 mg via ORAL
  Filled 2020-09-05 (×2): qty 1

## 2020-09-05 MED ORDER — ASPIRIN 81 MG PO CHEW
324.0000 mg | CHEWABLE_TABLET | Freq: Every day | ORAL | Status: DC
  Administered 2020-09-05 – 2020-09-07 (×3): 324 mg via ORAL
  Filled 2020-09-05 (×2): qty 4

## 2020-09-05 MED ORDER — ASPIRIN 81 MG PO CHEW: 81.0000 mg | CHEWABLE_TABLET | Freq: Every day | ORAL | Status: DC

## 2020-09-05 MED ORDER — SODIUM CHLORIDE 0.9 % IV SOLN: INTRAVENOUS | Status: DC | PRN

## 2020-09-05 NOTE — Progress Notes (Signed)
Occupational Therapy Treatment Patient Details Name: Bryan Davies MRN: [id number] DOB: 1950-11-16 Today's Date: 09/05/2020    History of present illness 69 y.o. male with history of stroke and March 2020 status post carotid stent placement with history of hypertension, COPD, ongoing tobacco abuse and bipolar disorder was brought to the ER after patient was found to be on the floor confused and weak on the left side as noticed by patient's girlfriend. MRI (-) for acute  CVA; chronic R MCA infarct with additional remote lacumar infarct incolving R thalamusl age  - related cerebral atrophy.  AKI in setting on rhabdomyolysis; elevated troponins; acute encephalopathy.     OT comments  Pt making steady progress towards OT goals this session. Pt continues to present with balance deficits, generalized weakness and cognitive deficits impacting pts ability to complete BADLs independently. Pt able to take steps to recliner this session with MIN A +2 and hand held assist. Pt currently requries supervision for LB ADLs from EOB and supervision for UB ADLs from recliner. Pt perseverating on wanting to milk needing cues to redirect back to session. Pt would continue to benefit from skilled occupational therapy while admitted and after d/c to address the below listed limitations in order to improve overall functional mobility and facilitate independence with BADL participation. DC plan remains appropriate, will follow acutely per POC.     Follow Up Recommendations  SNF;Supervision/Assistance - 24 hour    Equipment Recommendations  3 in 1 bedside commode;Other (comment) (RW)    Recommendations for Other Services      Precautions / Restrictions Precautions Precautions: Fall Restrictions Weight Bearing Restrictions: No       Mobility Bed Mobility Overal bed mobility: Needs Assistance Bed Mobility: Supine to Sit     Supine to sit: Min assist;HOB elevated     General bed mobility comments: pt  required light MIN A for intial steadying assist once sitting EOB and tactile/ verbal cues to sequence getting BLEs to EOB before elevating trunk  Transfers Overall transfer level: Needs assistance Equipment used: 2 person hand held assist Transfers: Sit to/from [employer] Sit to Stand: Min assist;From elevated surface Stand pivot transfers: Min assist;+2 safety/equipment       General transfer comment: MIN A +2 for safety for pt to power into standing, noted pt using LUE functionally during transfer. MIN A +2 to pivot to recliner needing most assist to steady self during transfer and for safety awareness when descending into recliner    Balance Overall balance assessment: Needs assistance Sitting-balance support: No upper extremity supported;Feet supported Sitting balance-Leahy Scale: Fair Sitting balance - Comments: close min guard when reaching out of BOS for LB ADLs   Standing balance support: Bilateral upper extremity supported;During functional activity Standing balance-Leahy Scale: Poor Standing balance comment: Reliant on external support                            ADL either performed or assessed with clinical judgement   ADL Overall ADL's : Needs assistance/impaired Eating/Feeding: Set up;Sitting;Supervision/ safety           Lower Body Bathing: Supervison/ safety;Sitting/lateral leans Lower Body Bathing Details (indicate cue type and reason): simulated via LB dressing from EOB     Lower Body Dressing: Supervision/safety;Sitting/lateral leans Lower Body Dressing Details (indicate cue type and reason): pt able to adjust socks from EOB with close supervision Toilet Transfer: Minimal assistance;+2 for safety/equipment;Stand-pivot Toilet Transfer Details (indicate cue  type and reason): MINA +2 with HHA for simulated toilet transfer to recliner         Functional mobility during ADLs: Minimal assistance;Cueing for safety;+2 for  safety/equipment General ADL Comments: pt presents with balance deficits, decreased AROM of LUE and cognitive deficits     Vision       Perception     Praxis      Cognition Arousal/Alertness: Awake/alert Behavior During Therapy: Flat affect Overall Cognitive Status: Impaired/Different from baseline Area of Impairment: Attention;Following commands;Safety/judgement;Awareness;Problem solving                   Current Attention Level: Sustained   Following Commands: Follows one step commands with increased time Safety/Judgement: Decreased awareness of safety;Decreased awareness of deficits Awareness: Emergent   General Comments: pt perseverating on wanting milk throughout entire session, pt required cues to redirect back to session, however pt was alert and oriented to day of week and month        Exercises     Shoulder Instructions       General Comments      Pertinent Vitals/ Pain       Pain Assessment: No/denies pain Faces Pain Scale: No hurt  Home Living                                          Prior Functioning/Environment              Frequency  Min 2X/week        Progress Toward Goals  OT Goals(current goals can now be found in the care plan section)  Progress towards OT goals: Progressing toward goals  Acute Rehab OT Goals Patient Stated Goal: to get some chocolate milk OT Goal Formulation: With patient Time For Goal Achievement: 09/15/20 Potential to Achieve Goals: Good  Plan Discharge plan remains appropriate;Frequency remains appropriate    Co-evaluation                 AM-PAC OT "6 Clicks" Daily Activity     Outcome Measure   Help from another person eating meals?: A Little Help from another person taking care of personal grooming?: A Little Help from another person toileting, which includes using toliet, bedpan, or urinal?: A Lot Help from another person bathing (including washing, rinsing,  drying)?: A Lot Help from another person to put on and taking off regular upper body clothing?: A Lot Help from another person to put on and taking off regular lower body clothing?: A Lot 6 Click Score: 14    End of Session Equipment Utilized During Treatment: Gait belt  OT Visit Diagnosis: Unsteadiness on feet (R26.81);Other abnormalities of gait and mobility (R26.89);History of falling (Z91.81);Muscle weakness (generalized) (M62.81);Other symptoms and signs involving cognitive function;Pain Pain - Right/Left: Left Pain - part of body: Shoulder;Knee   Activity Tolerance Patient tolerated treatment well   Patient Left in chair;with call bell/phone within reach;with chair alarm set;Other (comment) (MD team entering)   Nurse Communication Mobility status        Time: 8563-1497 OT Time Calculation (min): 9 min  Charges: OT General Charges $OT Visit: 1 Visit OT Treatments $Therapeutic Activity: 8-22 mins  Audery Amel., COTA/L Acute Rehabilitation Services [phone]    Angelina Pih 09/05/2020, 12:16 PM

## 2020-09-05 NOTE — Progress Notes (Addendum)
[facility] Infectious Diseases Follow Up Note  Patient Identification: Patient Name: Bryan Davies MRN: [id number] Admit Date: 08/31/2020  8:12 PM Age: 69 y.o.Today's Date: 09/05/2020   Reason for Visit: Follow up on staph spp bacteremia   Principal Problem:   Left-sided weakness Active Problems:   Hypertension   Bipolar disorder (HCC)   ARF (acute renal failure) (HCC)   Elevated troponin   Elevated LFTs   Non-ST elevation (NSTEMI) myocardial infarction [facility])   Non-traumatic rhabdomyolysis   Assessment Staph hominis in 4/4 blood cultures. I doubt this is a true bacteremia and probably a contaminant. Of note, both sets were drawn at the same time and not separate needle sticks and does not account for high grade bacteremia. He is afebrile, had leukocytosis on admit elevated to 18.4. Initial transaminitis trending down. He was started on Vanc followed by Doxy and currently on cefazolin. He has 2 areas of redness in his left anterior chest which is non tender and non fluctuant, probably related to his fall. He also had a small open wound in his rt anterior knee with small erythema. LE otherwise largely unremarkable. He has ulcer in his left ear which looks dry and non tender. He denies having any hardware in his body.  Repeat blood cultures have been negative in 24 hrs. TTE has been negative   Recommendations Continue cefazolin as is for now.  If repeat blood cultures remain negative in 48 hrs with continued clinical improvement, can de-escalate cefazolin to cephalexin 500mg  PO q6 hrs  Duration of antibiotics 10 days including IV and PO for cellulitis of the left anterior chest wall.   Rest of the management as per the primary team. Thank you for the consult. Please page with pertinent questions or concerns.  , MD Infectious Diseases  [facility]   To contact the attending provider  between 8A-5P or the covering provider during after hours 5P-8A, please log into the web site [email or web address] and access using universal [place] password for that web site. If you do not have the password, please call the hospital operator. ______________________________________________________________________ Subjective patient seen and examined at the bedside. Sitting up in bed. Denies any fever,chills, side effects with the antibiotics. Denies any hardwares in his body.   Vitals BP (!) 174/63 (BP Location: Right Arm)   Pulse (!) 102   Temp 98.3 F (36.8 C) (Oral)   Resp 19   Wt 72.9 kg   SpO2 97%   BMI 25.17 kg/m       Physical Exam Constitutional:      Comments: disheveled  Cardiovascular:     Rate and Rhythm: Normal rate and regular rhythm.     Heart sounds: Systolic murmur +  Pulmonary:     Effort: Pulmonary effort is normal.     Comments: Clear air netry bilaterally   Abdominal:     Palpations: Abdomen is soft.     Tenderness: None, BS+  Musculoskeletal:  small open wound in the RT anterior knee      General: No swelling or tenderness.   Skin:    Comments: 2 areas of redness in the left anterior chest  Neurological:     General: No focal deficit present.   Psychiatric:        Mood and Affect: Mood normal.    LINES/TUBES: PIVs, EU catheter  METAL IMPLANT/HARDWARE: None    Pertinent Microbiology Results for orders placed or performed during the hospital encounter of 08/31/20  Respiratory Panel by RT PCR (Flu A&B, Covid) - Nasopharyngeal Swab     Status: None   Collection Time: 08/31/20  8:40 PM   Specimen: Nasopharyngeal Swab  Result Value Ref Range Status   SARS Coronavirus 2 by RT PCR NEGATIVE NEGATIVE Final    Comment: (NOTE) SARS-CoV-2 target nucleic acids are NOT DETECTED.  The SARS-CoV-2 RNA is generally detectable in upper respiratoy specimens during the acute phase of infection. The lowest concentration of SARS-CoV-2 viral copies this  assay can detect is 131 copies/mL. A negative result does not preclude SARS-Cov-2 infection and should not be used as the sole basis for treatment or other patient management decisions. A negative result may occur with  improper specimen collection/handling, submission of specimen other than nasopharyngeal swab, presence of viral mutation(s) within the areas targeted by this assay, and inadequate number of viral copies (<131 copies/mL). A negative result must be combined with clinical observations, patient history, and epidemiological information. The expected result is Negative.  Fact Sheet for Patients:  https://www.moore.com/  Fact Sheet for Healthcare Providers:  https://www.young.biz/  This test is no t yet approved or cleared by the [place] FDA and  has been authorized for detection and/or diagnosis of SARS-CoV-2 by FDA under an Emergency Use Authorization (EUA). This EUA will remain  in effect (meaning this test can be used) for the duration of the COVID-19 declaration under Section 564(b)(1) of the Act, 21 U.S.C. section 360bbb-3(b)(1), unless the authorization is terminated or revoked sooner.     Influenza A by PCR NEGATIVE NEGATIVE Final   Influenza B by PCR NEGATIVE NEGATIVE Final    Comment: (NOTE) The Xpert Xpress SARS-CoV-2/FLU/RSV assay is intended as an aid in  the diagnosis of influenza from Nasopharyngeal swab specimens and  should not be used as a sole basis for treatment. Nasal washings and  aspirates are unacceptable for Xpert Xpress SARS-CoV-2/FLU/RSV  testing.  Fact Sheet for Patients: https://www.moore.com/  Fact Sheet for Healthcare Providers: https://www.young.biz/  This test is not yet approved or cleared by the [place] FDA and  has been authorized for detection and/or diagnosis of SARS-CoV-2 by  FDA under an Emergency Use Authorization (EUA). This EUA will  remain  in effect (meaning this test can be used) for the duration of the  Covid-19 declaration under Section 564(b)(1) of the Act, 21  U.S.C. section 360bbb-3(b)(1), unless the authorization is  terminated or revoked. Performed at [facility], [address] [place], [place] [address]   Culture, blood (routine x 2)     Status: Abnormal   Collection Time: 09/01/20  7:30 AM   Specimen: BLOOD  Result Value Ref Range Status   Specimen Description BLOOD SITE NOT SPECIFIED  Final   Special Requests   Final    BOTTLES DRAWN AEROBIC AND ANAEROBIC Blood Culture adequate volume   Culture  Setup Time   Final    AEROBIC BOTTLE ONLY GRAM POSITIVE COCCI CRITICAL VALUE NOTED.  VALUE IS CONSISTENT WITH PREVIOUSLY REPORTED AND CALLED VALUE.    Culture (A)  Final    STAPHYLOCOCCUS HOMINIS SUSCEPTIBILITIES PERFORMED ON PREVIOUS CULTURE WITHIN THE LAST 5 DAYS. Performed at [facility], [address], [place], [place] [address]    Report Status 09/04/2020 FINAL  Final  Culture, blood (routine x 2)     Status: Abnormal   Collection Time: 09/01/20  7:30 AM   Specimen: BLOOD  Result Value Ref Range Status   Specimen Description BLOOD SITE NOT  SPECIFIED  Final   Special Requests   Final    BOTTLES DRAWN AEROBIC AND ANAEROBIC Blood Culture adequate volume   Culture  Setup Time   Final    GRAM POSITIVE COCCI IN BOTH AEROBIC AND ANAEROBIC BOTTLES CRITICAL RESULT CALLED TO, READ BACK BY AND VERIFIED WITH: Melven Sartorius [facility] 09/02/20 0132 JDW Performed at [facility], [address] [place], [place] [address]    Culture STAPHYLOCOCCUS HOMINIS (A)  Final   Report Status 09/04/2020 FINAL  Final   Organism ID, Bacteria STAPHYLOCOCCUS HOMINIS  Final      Susceptibility   Staphylococcus hominis - MIC*    CIPROFLOXACIN <=0.5 SENSITIVE Sensitive     ERYTHROMYCIN >=8 RESISTANT Resistant     GENTAMICIN <=0.5 SENSITIVE Sensitive     OXACILLIN <=0.25 SENSITIVE Sensitive     TETRACYCLINE  <=1 SENSITIVE Sensitive     VANCOMYCIN <=0.5 SENSITIVE Sensitive     TRIMETH/SULFA <=10 SENSITIVE Sensitive     CLINDAMYCIN <=0.25 SENSITIVE Sensitive     RIFAMPIN <=0.5 SENSITIVE Sensitive     Inducible Clindamycin NEGATIVE Sensitive     * STAPHYLOCOCCUS HOMINIS  Blood Culture ID Panel (Reflexed)     Status: Abnormal   Collection Time: 09/01/20  7:30 AM  Result Value Ref Range Status   Enterococcus faecalis NOT DETECTED NOT DETECTED Final   Enterococcus Faecium NOT DETECTED NOT DETECTED Final   Listeria monocytogenes NOT DETECTED NOT DETECTED Final   Staphylococcus species DETECTED (A) NOT DETECTED Final    Comment: CRITICAL RESULT CALLED TO, READ BACK BY AND VERIFIED WITH: J LEDFORD PH[facility] 09/02/20 0132 JDW    Staphylococcus aureus (BCID) NOT DETECTED NOT DETECTED Final   Staphylococcus epidermidis DETECTED (A) NOT DETECTED Final    Comment: Methicillin (oxacillin) resistant coagulase negative staphylococcus. Possible blood culture contaminant (unless isolated from more than one blood culture draw or clinical case suggests pathogenicity). No antibiotic treatment is indicated for blood  culture contaminants. CRITICAL RESULT CALLED TO, READ BACK BY AND VERIFIED WITH: J LEDFORD PHARMD 09/02/20 0132 JDW    Staphylococcus lugdunensis NOT DETECTED NOT DETECTED Final   Streptococcus species NOT DETECTED NOT DETECTED Final   Streptococcus agalactiae NOT DETECTED NOT DETECTED Final   Streptococcus pneumoniae NOT DETECTED NOT DETECTED Final   Streptococcus pyogenes NOT DETECTED NOT DETECTED Final   A.calcoaceticus-baumannii NOT DETECTED NOT DETECTED Final   Bacteroides fragilis NOT DETECTED NOT DETECTED Final   Enterobacterales NOT DETECTED NOT DETECTED Final   Enterobacter cloacae complex NOT DETECTED NOT DETECTED Final   Escherichia coli NOT DETECTED NOT DETECTED Final   Klebsiella aerogenes NOT DETECTED NOT DETECTED Final   Klebsiella oxytoca NOT DETECTED NOT DETECTED Final   Klebsiella  pneumoniae NOT DETECTED NOT DETECTED Final   Proteus species NOT DETECTED NOT DETECTED Final   Salmonella species NOT DETECTED NOT DETECTED Final   Serratia marcescens NOT DETECTED NOT DETECTED Final   Haemophilus influenzae NOT DETECTED NOT DETECTED Final   Neisseria meningitidis NOT DETECTED NOT DETECTED Final   Pseudomonas aeruginosa NOT DETECTED NOT DETECTED Final   Stenotrophomonas maltophilia NOT DETECTED NOT DETECTED Final   Candida albicans NOT DETECTED NOT DETECTED Final   Candida auris NOT DETECTED NOT DETECTED Final   Candida glabrata NOT DETECTED NOT DETECTED Final   Candida krusei NOT DETECTED NOT DETECTED Final   Candida parapsilosis NOT DETECTED NOT DETECTED Final   Candida tropicalis NOT DETECTED NOT DETECTED Final   Cryptococcus neoformans/gattii NOT DETECTED NOT DETECTED Final   Methicillin  resistance mecA/C DETECTED (A) NOT DETECTED Final    Comment: CRITICAL RESULT CALLED TO, READ BACK BY AND VERIFIED WITH: Melven SartoriusJ LEDFORD [facility] 09/02/20 0132 JDW Performed at [facility], [address] [place], [place] [address]   Culture, blood (routine x 2)     Status: None (Preliminary result)   Collection Time: 09/04/20  5:09 AM   Specimen: BLOOD  Result Value Ref Range Status   Specimen Description BLOOD LEFT ANTECUBITAL  Final   Special Requests   Final    BOTTLES DRAWN AEROBIC AND ANAEROBIC Blood Culture adequate volume   Culture   Final    NO GROWTH 1 DAY Performed at [facility], [address] [place], [place] [address]    Report Status PENDING  Incomplete  Culture, blood (routine x 2)     Status: None (Preliminary result)   Collection Time: 09/04/20  5:13 AM   Specimen: BLOOD  Result Value Ref Range Status   Specimen Description BLOOD RIGHT ANTECUBITAL  Final   Special Requests   Final    BOTTLES DRAWN AEROBIC AND ANAEROBIC Blood Culture adequate volume   Culture   Final    NO GROWTH 1 DAY Performed at [facility], [address]  [place], [place] [address]    Report Status PENDING  Incomplete   Pertinent Lab. CBC Latest Ref Rng & Units 09/04/2020 09/03/2020 09/01/2020  WBC 4.0 - 10.5 K/uL 8.2 8.4 18.4(H)  Hemoglobin 13.0 - 17.0 g/dL 11.7(L) 11.8(L) 15.3  Hematocrit 39 - 52 % 34.6(L) 35.3(L) 45.0  Platelets 150 - 400 K/uL 139(L) 125(L) 204   CMP Latest Ref Rng & Units 09/04/2020 09/03/2020 09/02/2020  Glucose 70 - 99 mg/dL 914(N124(H) 829(F100(H) 621(H114(H)  BUN 8 - 23 mg/dL 18 08(M34(H) 57(Q62(H)  Creatinine 0.61 - 1.24 mg/dL 4.690.72 6.290.98 5.281.13  Sodium 135 - 145 mmol/L 139 140 138  Potassium 3.5 - 5.1 mmol/L 4.2 3.7 3.6  Chloride 98 - 111 mmol/L 105 107 105  CO2 22 - 32 mmol/L 27 26 25   Calcium 8.9 - 10.3 mg/dL 8.2(L) 8.1(L) 8.5(L)  Total Protein 6.5 - 8.1 g/dL 4.9(L) 5.2(L) -  Total Bilirubin 0.3 - 1.2 mg/dL 0.5 0.7 -  Alkaline Phos 38 - 126 U/L 70 58 -  AST 15 - 41 U/L 98(H) 242(H) -  ALT 0 - 44 U/L 371(H) 663(H) -    Pertinent Imaging today Plain films and CT images have been personally visualized and interpreted; radiology reports have been reviewed. Decision making incorporated into the Impression / Recommendations.  I have spent approx 30 minutes for this patient encounter including review of prior medical records with greater than 50% of time being face to face and coordination of their care.

## 2020-09-05 NOTE — Progress Notes (Addendum)
[facility] PROGRESS NOTE    Progress Note  Bryan Davies  CBJ:[id number] DOB: 04/17/1951 DOA: 08/31/2020 PCP: Gean Birchwood, [facility]     Brief Narrative:   Bryan Davies is an 69 y.o. male past medical history of stroke in 01/16/2019 status post carotid stenting, essential hypertension ongoing tobacco abuse bipolar disorder brought into the ED as he was found in the floor confused and weak on the left side by his girlfriend.  Assessment/Plan:   Left-sided weakness dueto a rotator cuff tear: Physical therapy evaluated the patient and recommended skilled nursing facility. Orthopedic surgery was consulted who performed steroid injection which she relates he feels better. They obtained an MRI that showed massive rotator cuff with full tear of supraspinatus infraspinatus and subscapularis, moderate atrophy of subscapularis medial dislocation of the bicep tendon We will follow up with orthopedic surgery as an outpatient for surgical repair.  Once his bacteremia is cleared.  New Bacteremia due to staph hominimis likely secondary to cellulitis of the chest wall and ear: Continue IV Ancef, surveillance blood cultures have remained negative and they continue to remain negative for the next 24 hours can be changed to oral Ancef and he will needed 10-day total course of antibiotics including IV. 2D echo was done that showed no vegetation.  Acute kidney injury/in the setting of nontraumatic rhabdomyolysis: Baseline creatinine of around 1, on admission 1.4. Creatinine returned to baseline after IV fluid hydration his CK is now below 1500 we will KVO IV fluids. MRI of kidney that showed a simple cyst radiology recommended to repeat an MRI in 6 to 12 months.  Elevated troponins: Denies any chest pain or shortness of breath. Likely due to rhabdomyolysis.  Elevated LFTs: Likely due to rhabdomyolysis. Hepatitis panel is negative HIV is nonreactive.  Acute encephalopathy: His ammonia level was  elevated, has no history of liver disease. Depakote level is 11, resume depakote  History of bipolar disorder: Resume Depakote.  History of essential hypertension: On IV hydralazine  BPH: On CT scan there that showed a enlarged prostate, insert a Foley start on Flomax    DVT prophylaxis: lovenxo Family Communication:none Status is: Inpatient  Remains inpatient appropriate because:Hemodynamically unstable   Dispo: The patient is from: Home              Anticipated d/c is to: SNF              Anticipated d/c date is:1 days              Patient currently is not medically stable to d/c.  to skilled nursing facility in 24 hours if surveillance blood cultures remain negative Code Status:     Code Status Orders  (From admission, onward)         Start     Ordered   09/01/20 0204  Full code  Continuous        09/01/20 0206        Code Status History    Date Active Date Inactive Code Status Order ID Comments User Context   05/27/2019 1621 06/01/2019 1600 Full Code [id number]  Talmage Nap, NP Inpatient   05/26/2019 2356 05/27/2019 1537 Full Code [id number]  Ward, Layla Maw, DO ED   05/03/2019 0250 05/04/2019 2026 Full Code [id number]  Dione Booze, MD ED   01/15/2019 2115 01/18/2019 1754 Full Code [id number]  Aroor, Dara Lords, MD ED   Advance Care Planning Activity        IV Access:  Peripheral IV   Procedures and diagnostic studies:   MR SHOULDER LEFT WO CONTRAST  Result Date: 09/04/2020 CLINICAL DATA:  Left shoulder pain and weakness. EXAM: MRI OF THE LEFT SHOULDER WITHOUT CONTRAST TECHNIQUE: Multiplanar, multisequence MR imaging of the shoulder was performed. No intravenous contrast was administered. COMPARISON:  Left shoulder x-rays dated September 01, 2020. FINDINGS: Despite efforts by the technologist and patient, motion artifact is present on today's exam and could not be eliminated. This severely reduces exam sensitivity and specificity. Rotator cuff: Full-thickness,  full width tears of the supraspinatus, infraspinatus, and subscapularis tendons. The teres minor is intact. Muscles: Moderate subscapularis and mild infraspinatus muscle atrophy. Biceps long head:  Appears intact, but is medially dislocated. Acromioclavicular Joint: Mild arthropathy of the acromioclavicular joint. Type II acromion. Moderate amount of subacromial/subdeltoid bursal fluid. Glenohumeral Joint: Small joint effusion. Scattered mild partial-thickness cartilage loss. Labrum:  Grossly intact. Bones: High-riding humeral head. No acute fracture or dislocation. No suspicious bone lesion. Other: None. IMPRESSION: 1. Severely limited study due to motion artifact. 2. Massive rotator cuff tear. Full-thickness, full width tears of the supraspinatus, infraspinatus, and subscapularis tendons. Moderate subscapularis and mild infraspinatus muscle atrophy. 3. Medial dislocation of the biceps tendon. 4. Mild acromioclavicular and glenohumeral osteoarthritis. Electronically Signed   By: Obie Dredge M.D.   On: 09/04/2020 09:09     Medical Consultants:    None.  Anti-Infectives:   none  Subjective:    Bryan Davies no new complaints. Objective:    Vitals:   09/04/20 2110 09/05/20 0028 09/05/20 0403 09/05/20 0855  BP: 126/73 (!) 144/89 (!) 152/76 (!) 162/92  Pulse: 97 (!) 105 99 100  Resp: 18 18 18 17   Temp: 98.5 F (36.9 C) 98.2 F (36.8 C) 98.4 F (36.9 C) 98 F (36.7 C)  TempSrc: Oral Oral Oral Oral  SpO2: 99% 100% 97% 100%  Weight:       SpO2: 100 %   Intake/Output Summary (Last 24 hours) at 09/05/2020 1049 Last data filed at 09/05/2020 0746 Gross per 24 hour  Intake 1883.31 ml  Output 5600 ml  Net -3716.69 ml   Filed Weights   09/04/20 1100  Weight: 72.9 kg    Exam: General exam: In no acute distress. Respiratory system: Good air movement and clear to auscultation. Cardiovascular system: S1 & S2 heard, RRR. No JVD. Gastrointestinal system: Abdomen is nondistended,  soft and nontender.  Extremities: No pedal edema.  Data Reviewed:    Labs: Basic Metabolic Panel: Recent Labs  Lab 09/01/20 0203 09/01/20 0203 09/01/20 1244 09/01/20 1244 09/02/20 0157 09/02/20 0157 09/03/20 0208 09/04/20 0504  NA 149*  --  147*  --  138  --  140 139  K 3.9   < > 3.9   < > 3.6   < > 3.7 4.2  CL 111  --  110  --  105  --  107 105  CO2 26  --  27  --  25  --  26 27  GLUCOSE 113*  --  171*  --  114*  --  100* 124*  BUN 76*  --  79*  --  62*  --  34* 18  CREATININE 1.40*  --  1.42*  --  1.13  --  0.98 0.72  CALCIUM 9.3  --  9.0  --  8.5*  --  8.1* 8.2*   < > = values in this interval not displayed.   GFR Estimated Creatinine Clearance: 83.8 mL/min (by C-G  formula based on SCr of 0.72 mg/dL). Liver Function Tests: Recent Labs  Lab 08/31/20 2034 09/01/20 0203 09/03/20 0208 09/04/20 1517  AST 388* 477* 242* 98*  ALT 471* 614* 663* 371*  ALKPHOS 73 72 58 70  BILITOT 1.8* 1.7* 0.7 0.5  PROT 7.2 6.8 5.2* 4.9*  ALBUMIN 3.7 3.6 2.5* 2.5*   No results for input(s): LIPASE, AMYLASE in the last 168 hours. Recent Labs  Lab 08/31/20 2038 09/01/20 0730  AMMONIA 62* 37*   Coagulation profile Recent Labs  Lab 09/01/20 0203  INR 1.2   COVID-19 Labs  No results for input(s): DDIMER, FERRITIN, LDH, CRP in the last 72 hours.  Lab Results  Component Value Date   SARSCOV2NAA NEGATIVE 08/31/2020   SARSCOV2NAA NEGATIVE 09/12/2019   SARSCOV2NAA NEGATIVE 05/03/2019    CBC: Recent Labs  Lab 08/31/20 2034 08/31/20 2247 09/01/20 0203 09/03/20 0208 09/04/20 0504  WBC 19.4*  --  18.4* 8.4 8.2  NEUTROABS 17.0*  --  16.2* 5.9 5.8  HGB 16.5 12.9* 15.3 11.8* 11.7*  HCT 48.8 38.0* 45.0 35.3* 34.6*  MCV 91.4  --  90.5 91.5 92.5  PLT 212  --  204 125* 139*   Cardiac Enzymes: Recent Labs  Lab 09/01/20 0134 09/02/20 0157 09/03/20 0208  CKTOTAL 4,614* 3,065* 1,474*   BNP (last 3 results) No results for input(s): PROBNP in the last 8760  hours. CBG: Recent Labs  Lab 08/31/20 2024  GLUCAP 178*   D-Dimer: No results for input(s): DDIMER in the last 72 hours. Hgb A1c: No results for input(s): HGBA1C in the last 72 hours. Lipid Profile: No results for input(s): CHOL, HDL, LDLCALC, TRIG, CHOLHDL, LDLDIRECT in the last 72 hours. Thyroid function studies: No results for input(s): TSH, T4TOTAL, T3FREE, THYROIDAB in the last 72 hours.  Invalid input(s): FREET3 Anemia work up: No results for input(s): VITAMINB12, FOLATE, FERRITIN, TIBC, IRON, RETICCTPCT in the last 72 hours. Sepsis Labs: Recent Labs  Lab 08/31/20 2034 09/01/20 0203 09/03/20 0208 09/04/20 0504  WBC 19.4* 18.4* 8.4 8.2   Microbiology Recent Results (from the past 240 hour(s))  Respiratory Panel by RT PCR (Flu A&B, Covid) - Nasopharyngeal Swab     Status: None   Collection Time: 08/31/20  8:40 PM   Specimen: Nasopharyngeal Swab  Result Value Ref Range Status   SARS Coronavirus 2 by RT PCR NEGATIVE NEGATIVE Final    Comment: (NOTE) SARS-CoV-2 target nucleic acids are NOT DETECTED.  The SARS-CoV-2 RNA is generally detectable in upper respiratoy specimens during the acute phase of infection. The lowest concentration of SARS-CoV-2 viral copies this assay can detect is 131 copies/mL. A negative result does not preclude SARS-Cov-2 infection and should not be used as the sole basis for treatment or other patient management decisions. A negative result may occur with  improper specimen collection/handling, submission of specimen other than nasopharyngeal swab, presence of viral mutation(s) within the areas targeted by this assay, and inadequate number of viral copies (<131 copies/mL). A negative result must be combined with clinical observations, patient history, and epidemiological information. The expected result is Negative.  Fact Sheet for Patients:  https://www.moore.com/  Fact Sheet for Healthcare Providers:   https://www.young.biz/  This test is no t yet approved or cleared by the [place] FDA and  has been authorized for detection and/or diagnosis of SARS-CoV-2 by FDA under an Emergency Use Authorization (EUA). This EUA will remain  in effect (meaning this test can be used) for the duration of the COVID-19 declaration  under Section 564(b)(1) of the Act, 21 U.S.C. section 360bbb-3(b)(1), unless the authorization is terminated or revoked sooner.     Influenza A by PCR NEGATIVE NEGATIVE Final   Influenza B by PCR NEGATIVE NEGATIVE Final    Comment: (NOTE) The Xpert Xpress SARS-CoV-2/FLU/RSV assay is intended as an aid in  the diagnosis of influenza from Nasopharyngeal swab specimens and  should not be used as a sole basis for treatment. Nasal washings and  aspirates are unacceptable for Xpert Xpress SARS-CoV-2/FLU/RSV  testing.  Fact Sheet for Patients: https://www.moore.com/  Fact Sheet for Healthcare Providers: https://www.young.biz/  This test is not yet approved or cleared by the [place] FDA and  has been authorized for detection and/or diagnosis of SARS-CoV-2 by  FDA under an Emergency Use Authorization (EUA). This EUA will remain  in effect (meaning this test can be used) for the duration of the  Covid-19 declaration under Section 564(b)(1) of the Act, 21  U.S.C. section 360bbb-3(b)(1), unless the authorization is  terminated or revoked. Performed at [facility], [address] [place], [place] [address]   Culture, blood (routine x 2)     Status: Abnormal   Collection Time: 09/01/20  7:30 AM   Specimen: BLOOD  Result Value Ref Range Status   Specimen Description BLOOD SITE NOT SPECIFIED  Final   Special Requests   Final    BOTTLES DRAWN AEROBIC AND ANAEROBIC Blood Culture adequate volume   Culture  Setup Time   Final    AEROBIC BOTTLE ONLY GRAM POSITIVE COCCI CRITICAL VALUE NOTED.  VALUE IS  CONSISTENT WITH PREVIOUSLY REPORTED AND CALLED VALUE.    Culture (A)  Final    STAPHYLOCOCCUS HOMINIS SUSCEPTIBILITIES PERFORMED ON PREVIOUS CULTURE WITHIN THE LAST 5 DAYS. Performed at [facility], [address] [place], [place] [address]    Report Status 09/04/2020 FINAL  Final  Culture, blood (routine x 2)     Status: Abnormal   Collection Time: 09/01/20  7:30 AM   Specimen: BLOOD  Result Value Ref Range Status   Specimen Description BLOOD SITE NOT SPECIFIED  Final   Special Requests   Final    BOTTLES DRAWN AEROBIC AND ANAEROBIC Blood Culture adequate volume   Culture  Setup Time   Final    GRAM POSITIVE COCCI IN BOTH AEROBIC AND ANAEROBIC BOTTLES CRITICAL RESULT CALLED TO, READ BACK BY AND VERIFIED WITH: Melven Sartorius [facility] 09/02/20 0132 JDW Performed at [facility], [address], [place], [place] [address]    Culture STAPHYLOCOCCUS HOMINIS (A)  Final   Report Status 09/04/2020 FINAL  Final   Organism ID, Bacteria STAPHYLOCOCCUS HOMINIS  Final      Susceptibility   Staphylococcus hominis - MIC*    CIPROFLOXACIN <=0.5 SENSITIVE Sensitive     ERYTHROMYCIN >=8 RESISTANT Resistant     GENTAMICIN <=0.5 SENSITIVE Sensitive     OXACILLIN <=0.25 SENSITIVE Sensitive     TETRACYCLINE <=1 SENSITIVE Sensitive     VANCOMYCIN <=0.5 SENSITIVE Sensitive     TRIMETH/SULFA <=10 SENSITIVE Sensitive     CLINDAMYCIN <=0.25 SENSITIVE Sensitive     RIFAMPIN <=0.5 SENSITIVE Sensitive     Inducible Clindamycin NEGATIVE Sensitive     * STAPHYLOCOCCUS HOMINIS  Blood Culture ID Panel (Reflexed)     Status: Abnormal   Collection Time: 09/01/20  7:30 AM  Result Value Ref Range Status   Enterococcus faecalis NOT DETECTED NOT DETECTED Final   Enterococcus Faecium NOT DETECTED NOT DETECTED  Final   Listeria monocytogenes NOT DETECTED NOT DETECTED Final   Staphylococcus species DETECTED (A) NOT DETECTED Final    Comment: CRITICAL RESULT CALLED TO, READ BACK BY AND VERIFIED WITH: J  LEDFORD PHARMD 09/02/20 0132 JDW    Staphylococcus aureus (BCID) NOT DETECTED NOT DETECTED Final   Staphylococcus epidermidis DETECTED (A) NOT DETECTED Final    Comment: Methicillin (oxacillin) resistant coagulase negative staphylococcus. Possible blood culture contaminant (unless isolated from more than one blood culture draw or clinical case suggests pathogenicity). No antibiotic treatment is indicated for blood  culture contaminants. CRITICAL RESULT CALLED TO, READ BACK BY AND VERIFIED WITH: J LEDFORD PHARMD 09/02/20 0132 JDW    Staphylococcus lugdunensis NOT DETECTED NOT DETECTED Final   Streptococcus species NOT DETECTED NOT DETECTED Final   Streptococcus agalactiae NOT DETECTED NOT DETECTED Final   Streptococcus pneumoniae NOT DETECTED NOT DETECTED Final   Streptococcus pyogenes NOT DETECTED NOT DETECTED Final   A.calcoaceticus-baumannii NOT DETECTED NOT DETECTED Final   Bacteroides fragilis NOT DETECTED NOT DETECTED Final   Enterobacterales NOT DETECTED NOT DETECTED Final   Enterobacter cloacae complex NOT DETECTED NOT DETECTED Final   Escherichia coli NOT DETECTED NOT DETECTED Final   Klebsiella aerogenes NOT DETECTED NOT DETECTED Final   Klebsiella oxytoca NOT DETECTED NOT DETECTED Final   Klebsiella pneumoniae NOT DETECTED NOT DETECTED Final   Proteus species NOT DETECTED NOT DETECTED Final   Salmonella species NOT DETECTED NOT DETECTED Final   Serratia marcescens NOT DETECTED NOT DETECTED Final   Haemophilus influenzae NOT DETECTED NOT DETECTED Final   Neisseria meningitidis NOT DETECTED NOT DETECTED Final   Pseudomonas aeruginosa NOT DETECTED NOT DETECTED Final   Stenotrophomonas maltophilia NOT DETECTED NOT DETECTED Final   Candida albicans NOT DETECTED NOT DETECTED Final   Candida auris NOT DETECTED NOT DETECTED Final   Candida glabrata NOT DETECTED NOT DETECTED Final   Candida krusei NOT DETECTED NOT DETECTED Final   Candida parapsilosis NOT DETECTED NOT DETECTED Final    Candida tropicalis NOT DETECTED NOT DETECTED Final   Cryptococcus neoformans/gattii NOT DETECTED NOT DETECTED Final   Methicillin resistance mecA/C DETECTED (A) NOT DETECTED Final    Comment: CRITICAL RESULT CALLED TO, READ BACK BY AND VERIFIED WITHMelven Sartorius [facility] 09/02/20 0132 JDW Performed at [facility], [address], [place], [place] [address]   Culture, blood (routine x 2)     Status: None (Preliminary result)   Collection Time: 09/04/20  5:09 AM   Specimen: BLOOD  Result Value Ref Range Status   Specimen Description BLOOD LEFT ANTECUBITAL  Final   Special Requests   Final    BOTTLES DRAWN AEROBIC AND ANAEROBIC Blood Culture adequate volume   Culture   Final    NO GROWTH 1 DAY Performed at [facility], [address] [place], [place] [address]    Report Status PENDING  Incomplete  Culture, blood (routine x 2)     Status: None (Preliminary result)   Collection Time: 09/04/20  5:13 AM   Specimen: BLOOD  Result Value Ref Range Status   Specimen Description BLOOD RIGHT ANTECUBITAL  Final   Special Requests   Final    BOTTLES DRAWN AEROBIC AND ANAEROBIC Blood Culture adequate volume   Culture   Final    NO GROWTH 1 DAY Performed at [facility], [address], [place], [place] [address]    Report Status PENDING  Incomplete     Medications:   . aspirin  300 mg Rectal Daily   Or  . aspirin  325 mg Oral Daily  . bupivacaine  10 mL Infiltration Once  . Chlorhexidine Gluconate Cloth  6 each Topical Daily  . divalproex  750 mg Oral QHS  . gabapentin  300 mg Oral BID  . [START ON 09/07/2020] haloperidol decanoate  100 mg Intramuscular Q30 days  . losartan  50 mg Oral Daily  . methylPREDNISolone acetate  40 mg Intra-articular Once  . nicotine  14 mg Transdermal Daily  . tamsulosin  0.8 mg Oral QPC breakfast   Continuous Infusions: .  ceFAZolin (ANCEF) IV 2 g (09/05/20 0542)      LOS: 4 days   Bryan Davies  [facility]  09/05/2020, 10:49 AM

## 2020-09-06 ENCOUNTER — Other Ambulatory Visit: Payer: Self-pay

## 2020-09-06 DIAGNOSIS — R531 Weakness: Secondary | ICD-10-CM

## 2020-09-06 NOTE — Progress Notes (Signed)
  Speech Language Pathology Treatment: Dysphagia  Patient Details Name: Bryan Davies MRN: [id number] DOB: 06-24-1951 Today's Date: 09/06/2020 Time: 2130-8657 SLP Time Calculation (min) (ACUTE ONLY): 9 min  Assessment / Plan / Recommendation Clinical Impression  Pt was resting upon SLP arrival and needed encouragement to engage in PO trials. Pt was impulsive with intake, filling his mouth by taking bites that were very large or taking too many bites at one time. During these moments he had associated coughing, turning read and coughing for a mildly prolonged time. SLP provided Mod cues for smaller, single bites that eliminated coughing. No overt s/s of aspiration were observed with thin liquids even when consumped more rapidly. Recommend that pt continue current diet and with use of intermittent but close supervision to monitor for use of swallowing precautions.     HPI HPI: 69 y.o. male with history of stroke and March 2020 status post carotid stent placement with history of hypertension, COPD, ongoing tobacco abuse and bipolar disorder was brought to the ER after patient was found to be on the floor confused and weak on the left side as noticed by patient's girlfriend. MRI (-) for acute  CVA; chronic R MCA infarct with additional remote lacumar infarct incolving R thalamusl age  - related cerebral atrophy.  AKI in setting on rhabdomyolysis; elevated troponins; acute encephalopathy.      SLP Plan  Continue with current plan of care       Recommendations  Diet recommendations: Dysphagia 3 (mechanical soft);Thin liquid Liquids provided via: Cup;Straw Medication Administration: Whole meds with puree Supervision: Intermittent supervision to cue for compensatory strategies;Patient able to self feed Compensations: Minimize environmental distractions;Slow rate;Small sips/bites Postural Changes and/or Swallow Maneuvers: Seated upright 90 degrees;Upright 30-60 min after meal                 Oral Care Recommendations: Oral care BID Follow up Recommendations: Skilled Nursing facility;24 hour supervision/assistance;Home health SLP SLP Visit Diagnosis: Dysphagia, unspecified (R13.10);Cognitive communication deficit (R41.841) Plan: Continue with current plan of care       GO                Mahala Menghini., M.A. CCC-SLP Acute Rehabilitation Services Pager [phone] Office [phone]  09/06/2020, 1:46 PM

## 2020-09-06 NOTE — Progress Notes (Signed)
ID Brief Note   Patient seen and examined. Spoke with his nurse. Seems to be less interactive today. Overall clinically stable   Denies any pain/discomfort. Denies fever, chills  Afebrile, leukocytosis resolved  Repeat Blood cx NG in 2 days. On cefazolin  The redness in the left anterior chest seems to be improving compared to yesterday.   Case manager/SW on board for SNF placement  Continue cefazolin while inpatient. Can transition to Cephalexin 500mg  PO q6 hrs when ready for discharge. Duration of abx would be 10 days total including IV and PO.  Will sign off for now. Please call back with any questions or concerns  Thank you for the consult   [place], MD Infectious Diseaes [facility]

## 2020-09-06 NOTE — Progress Notes (Signed)
Physical Therapy Treatment Patient Details Name: Bryan Davies MRN: [id number] DOB: 03-08-1951 Today's Date: 09/06/2020    History of Present Illness 69 y.o. male with history of stroke and March 2020 status post carotid stent placement with history of hypertension, COPD, ongoing tobacco abuse and bipolar disorder was brought to the ER after patient was found to be on the floor confused and weak on the left side as noticed by patient's girlfriend. MRI (-) for acute  CVA; chronic R MCA infarct with additional remote lacumar infarct incolving R thalamusl age  - related cerebral atrophy.  AKI in setting on rhabdomyolysis; elevated troponins; acute encephalopathy.      PT Comments    Pt received in bed, relaying that he needs to have bowel movement. Pt continues to be impulsive with movement and does not attends to safety concerns such as lines. Min HHA to come to EOB. Min A +2 for power up and to steady while pivoting to [facility]. Min A +2 for safety to stand to RW. Pt's LE began trembling after 2 mins standing and he needed a seated rest break before performing standing regait activity. Pt unable to step L foot in standing, manual facilitation performed for wt shifting and L hip flexor activation. Pt also performed seated there ex. Left in recliner with chair alarm. PT will continue to follow.    Follow Up Recommendations  SNF;Supervision/Assistance - 24 hour     Equipment Recommendations  Other (comment) (TBD)    Recommendations for Other Services       Precautions / Restrictions Precautions Precautions: Fall Precaution Comments: decreased safety awareness Restrictions Weight Bearing Restrictions: No    Mobility  Bed Mobility Overal bed mobility: Needs Assistance Bed Mobility: Supine to Sit     Supine to sit: Min assist;HOB elevated     General bed mobility comments: min A HHA to get hips moving from middle of bed. Pt also needed assist removing covers  Transfers Overall transfer  level: Needs assistance Equipment used: 2 person hand held assist;Rolling walker (2 wheeled) Transfers: Sit to/from [employer] Sit to Stand: Min assist;From elevated surface Stand pivot transfers: Min assist;+2 safety/equipment       General transfer comment: min A +2 to steady with pivot to BSC. Stood to RW from [facility] with min A +2 for safety but not physical assist.   Ambulation/Gait             General Gait Details: attempted to step feet but pt unable to effectively move LLE, performed pregait activities in standing   Stairs             Wheelchair Mobility    Modified Rankin (Stroke Patients Only)       Balance Overall balance assessment: Needs assistance Sitting-balance support: No upper extremity supported;Feet supported Sitting balance-Leahy Scale: Fair Sitting balance - Comments: close guarding as pt does not seem to realize when he is out of BOS   Standing balance support: Bilateral upper extremity supported;During functional activity Standing balance-Leahy Scale: Poor Standing balance comment: Reliant on external support                             Cognition Arousal/Alertness: Awake/alert Behavior During Therapy: Flat affect Overall Cognitive Status: Impaired/Different from baseline Area of Impairment: Attention;Following commands;Safety/judgement;Awareness;Problem solving                 Orientation Level: Disoriented to;Situation;Time Current Attention Level: Sustained Memory:  Decreased recall of precautions;Decreased short-term memory Following Commands: Follows one step commands with increased time Safety/Judgement: Decreased awareness of safety;Decreased awareness of deficits Awareness: Emergent Problem Solving: Slow processing;Difficulty sequencing;Requires verbal cues General Comments: pt easily distracted      Exercises General Exercises - Lower Extremity Long Arc Quad: AAROM;Both;10  reps;Seated;AROM Hip Flexion/Marching: Standing;AAROM;AROM;Both;10 reps    General Comments General comments (skin integrity, edema, etc.): HR 127 bpm after activity, SpO2 in 90's on RA. pt able to have BM BSC, NT notified      Pertinent Vitals/Pain Pain Assessment: Faces Faces Pain Scale: Hurts little more Pain Location: L shoulder and elbow Pain Descriptors / Indicators: Grimacing;Guarding Pain Intervention(s): Limited activity within patient's tolerance;Monitored during session    Home Living                      Prior Function            PT Goals (current goals can now be found in the care plan section) Acute Rehab PT Goals Patient Stated Goal: use the bedside commode PT Goal Formulation: With patient Time For Goal Achievement: 09/15/20 Potential to Achieve Goals: Fair Progress towards PT goals: Progressing toward goals    Frequency    Min 2X/week      PT Plan Current plan remains appropriate    Co-evaluation              AM-PAC PT "6 Clicks" Mobility   Outcome Measure  Help needed turning from your back to your side while in a flat bed without using bedrails?: A Little Help needed moving from lying on your back to sitting on the side of a flat bed without using bedrails?: A Little Help needed moving to and from a bed to a chair (including a wheelchair)?: A Lot Help needed standing up from a chair using your arms (e.g., wheelchair or bedside chair)?: A Lot Help needed to walk in hospital room?: Total Help needed climbing 3-5 steps with a railing? : Total 6 Click Score: 12    End of Session Equipment Utilized During Treatment: Gait belt Activity Tolerance: Patient tolerated treatment well Patient left: with call bell/phone within reach;in chair;with chair alarm set Nurse Communication: Mobility status PT Visit Diagnosis: Unsteadiness on feet (R26.81);Muscle weakness (generalized) (M62.81)     Time: 0539-7673 PT Time Calculation (min)  (ACUTE ONLY): 20 min  Charges:  $Therapeutic Activity: 8-22 mins                     Lyanne Co, PT  Acute Rehab Services  Pager [phone] Office [phone]    Lawana Chambers Cortlynn Hollinsworth 09/06/2020, 11:39 AM

## 2020-09-06 NOTE — Care Management Important Message (Signed)
Important Message  Patient Details  Name: Bryan Davies MRN: [id number] Date of Birth: 07/29/1951   Medicare Important Message Given:  Yes     Dorena Bodo 09/06/2020, 1:39 PM

## 2020-09-06 NOTE — Progress Notes (Signed)
     Subjective:  Pain has improved since injection, however continues to have mild pain at left shoulder. Denies parasthesias, no other complaints.   Objective:  PE: VITALS:   Vitals:   09/06/20 0346 09/06/20 0851 09/06/20 1138 09/06/20 1625  BP: 127/70 126/69 128/75 123/70  Pulse: (!) 102 (!) 101 (!) 103 (!) 101  Resp: 18 20 20 19   Temp: 98.4 F (36.9 C) 98.3 F (36.8 C) 97.9 F (36.6 C) 98.2 F (36.8 C)  TempSrc: Oral Oral Oral Oral  SpO2: 100% 94% 100% 97%  Weight:      Height:       General: alert, sitting up in bed, in no acute distress MSK: No edema or erythema of left shoulder. 0-15 degrees of active forward flexion of left shoulder. 0-90 degrees of PROM. 10 degrees of active external rotation. difficultly with active extension at elbow can move 45-100 degrees, but passive extension does not create pain.Sensation intact to lateral shoulder. + radial pulse. Sensation intact to median, radial, and ulnar nerves. Able to flex, extend, and abduct all fingers of left hand though noted flexion contracture of 4th and 5th fingers. 3/5 grip strength.   LABS  No results found for this or any previous visit (from the past 24 hour(s)).  No results found.  Assessment/Plan: Principal Problem:   Left-sided weakness Active Problems:   Hypertension   Bipolar disorder (HCC)   ARF (acute renal failure) (HCC)   Elevated troponin   Elevated LFTs   Non-ST elevation (NSTEMI) myocardial infarction [facility])   Non-traumatic rhabdomyolysis   Left shoulder rotator cuff arthropathy: - MRI ordered shows massive rotator cuff tear, with medial dislocation of biceps, mild AC and glenohumeral osteoarthritis - injection provided some pain relief and did have slightly improved motion today - patient to follow-up with [facility] discharge for possible surgical planning when medical status improves - ok to continue PT and OT involving LUE shoulder as patient can tolerate - will continue to  follow along intermittently during hospital stay  Contact information:   Weekdays 8-5 07-02-2006, PA-C [phone] A fter hours and holidays please check [email or web address] for group call information for [facility]  [phone] 09/06/2020, 5:49 PM

## 2020-09-06 NOTE — Plan of Care (Signed)

## 2020-09-07 LAB — COMPREHENSIVE METABOLIC PANEL
ALT: 113 U/L — ABNORMAL HIGH (ref 0–44)
AST: 39 U/L (ref 15–41)
Albumin: 2.7 g/dL — ABNORMAL LOW (ref 3.5–5.0)
Alkaline Phosphatase: 67 U/L (ref 38–126)
Anion gap: 9 (ref 5–15)
BUN: 24 mg/dL — ABNORMAL HIGH (ref 8–23)
CO2: 28 mmol/L (ref 22–32)
Calcium: 9.2 mg/dL (ref 8.9–10.3)
Chloride: 103 mmol/L (ref 98–111)
Creatinine, Ser: 0.9 mg/dL (ref 0.61–1.24)
GFR, Estimated: >60 mL/min (ref >60)
Glucose, Bld: 131 mg/dL — ABNORMAL HIGH (ref 70–99)
Potassium: 4.4 mmol/L (ref 3.5–5.1)
Sodium: 140 mmol/L (ref 135–145)
Total Bilirubin: 0.6 mg/dL (ref 0.3–1.2)
Total Protein: 6.2 g/dL — ABNORMAL LOW (ref 6.5–8.1)

## 2020-09-07 LAB — RESPIRATORY PANEL BY RT PCR (FLU A&B, COVID)
Influenza A by PCR: NEGATIVE
Influenza B by PCR: NEGATIVE
SARS Coronavirus 2 by RT PCR: NEGATIVE

## 2020-09-07 LAB — CBC
HCT: 37.5 % — ABNORMAL LOW (ref 39.0–52.0)
Hemoglobin: 12.6 g/dL — ABNORMAL LOW (ref 13.0–17.0)
MCH: 31 pg (ref 26.0–34.0)
MCHC: 33.6 g/dL (ref 30.0–36.0)
MCV: 92.4 fL (ref 80.0–100.0)
Platelets: 184 10*3/uL (ref 150–400)
RBC: 4.06 MIL/uL — ABNORMAL LOW (ref 4.22–5.81)
RDW: 12.7 % (ref 11.5–15.5)
WBC: 9.5 10*3/uL (ref 4.0–10.5)
nRBC: 0 % (ref 0.0–0.2)

## 2020-09-07 LAB — CK: Total CK: 284 U/L (ref 49–397)

## 2020-09-07 MED ORDER — ASPIRIN 81 MG PO CHEW
324.0000 mg | CHEWABLE_TABLET | Freq: Every day | ORAL | Status: AC
Start: 2020-09-08 — End: ?

## 2020-09-07 MED ORDER — AUSTEDO 12 MG PO TABS: 18.0000 mg | ORAL_TABLET | Freq: Two times a day (BID) | ORAL | Status: DC

## 2020-09-07 MED ORDER — CEPHALEXIN 500 MG PO CAPS: 500.0000 mg | ORAL_CAPSULE | Freq: Four times a day (QID) | ORAL | 0 refills | Status: AC

## 2020-09-07 MED ORDER — TAMSULOSIN HCL 0.4 MG PO CAPS: 0.8000 mg | ORAL_CAPSULE | Freq: Every day | ORAL | Status: AC

## 2020-09-07 NOTE — Progress Notes (Signed)
Called report to [facility]empner at [facility] [employer].  Placed clothes on and is awaiting his transportation.

## 2020-09-07 NOTE — TOC Transition Note (Signed)
Transition of Care [facility]) - CM/SW Discharge Note   Patient Details  Name: Bryan Davies MRN: [id number] Date of Birth: 04/21/1951  [facility] CM/SW Contact:  Baldemar Lenis, LCSW Phone Number: 09/07/2020, 1:40 PM   Clinical Narrative:   Nurse to call report to [phone], Room 119B.    Final next level of care: Skilled Nursing Facility Barriers to Discharge: Barriers Resolved   Patient Goals and CMS Choice Patient states their goals for this hospitalization and ongoing recovery are:: Pt agreeable to SNF placement. CMS Medicare.gov Compare Post Acute Care list provided to:: Patient Choice offered to / list presented to : Patient  Discharge Placement              Patient chooses bed at: [facility] Patient to be transferred to facility by: [facility] Name of family member notified: Alcario Drought Patient and family notified of of transfer: 09/07/20  Discharge Plan and Services In-house Referral: Clinical Social Work   Post Acute Care Choice: Skilled Nursing Facility                               Social Determinants of Health (SDOH) Interventions     Readmission Risk Interventions No flowsheet data found.

## 2020-09-07 NOTE — Progress Notes (Signed)
[facility] PROGRESS NOTE    Progress Note  ABDULMALIK DARCO  ZOX:[id number] DOB: 1951-03-20 DOA: 08/31/2020 PCP: Gean Birchwood, [facility]     Brief Narrative:   Bryan Davies is an 69 y.o. male past medical history of stroke in 01/16/2019 status post carotid stenting, essential hypertension ongoing tobacco abuse bipolar disorder brought into the ED as he was found in the floor confused and weak on the left side by his girlfriend, in the ED was found to have left-sided weakness stroke was ruled out was found to have a complete left-sided rotator cuff and strep and hominis bacteremia with acute kidney injury.  Assessment/Plan:   Left-sided weakness due to a complete rotator cuff tear: MRI of the left shoulder was done that showed multiple complete tears of muscles and atrophy. Orthopedic surgery was consulted who performed steroid injection which she relates she feels better.   They will see him as an outpatient for surgical intervention once bacteremia has been cleared.  New bacteremia staph hominimis in the setting of cellulitis of the chest wall and ear: Started empirically on IV Ancef surveillance cultures have remained negative till date can change to oral antibiotics in 24 to 48 hours if surveillance cultures remain negative will need a total of 10-day course of antibiotics 2D echo showed no vegetation. Appreciate IDs assistance.  Acute kidney injury in the setting of a nontraumatic rhabdomyolysis: With a baseline creatinine of around one on admission 1.4 CK was greater than 5000 he was started on IV fluids and his creatinine returned to baseline. His CK improved it was less than 1000 IV fluids were KVO. He is tolerating his diet well.  Elevated troponins: Denies any chest pain or shortness of breath likely due to rhabdomyolysis 2D echo showed no wall motion abnormalities.  Elevated LFTs: Likely due to rhabdomyolysis there are resolving.  Acute encephalopathy: Likely due to  bacteremia now resolved. His Depakote level was resumed.  History of bipolar disorder: Continue Depakote.  History of essential hypertension: On hydralazine IV as needed. Can probably be started on his home regimen once he is ready to discharge and his blood pressure continues to worsen.  BPH: Had a globus on physical exam on admission a Foley was placed he was restarted on his Flomax he will probably have to be discharged with a Foley and continue Flomax as an outpatient which could be discontinued then. Hypertension        DVT prophylaxis: Lovenox Family Communication:None* Status is: Inpatient   Dispo: He will probably need to go on oral antibiotics at home, physical therapy evaluated the patient and probably need skilled nursing facility.        Code Status:     Code Status Orders  (From admission, onward)         Start     Ordered   09/01/20 0204  Full code  Continuous        09/01/20 0206        Code Status History    Date Active Date Inactive Code Status Order ID Comments User Context   05/27/2019 1621 06/01/2019 1600 Full Code [id number]  Talmage Nap, NP Inpatient   05/26/2019 2356 05/27/2019 1537 Full Code [id number]  Ward, Layla Maw, DO ED   05/03/2019 0250 05/04/2019 2026 Full Code [id number]  Dione Booze, MD ED   01/15/2019 2115 01/18/2019 1754 Full Code [id number]  Aroor, Dara Lords, MD ED   Advance Care Planning Activity    Advance Directive Documentation  Most Recent Value  Type of Advance Directive Healthcare Power of Attorney, Living will  Pre-existing out of facility DNR order (yellow form or pink MOST form) --  "MOST" Form in Place? --        IV Access:    Peripheral IV   Procedures and diagnostic studies:   No results found.   Medical Consultants:    None.  Anti-Infectives:   Ancef  Subjective:    KAYD LAUNER  No new complains  Objective:    Vitals:   09/06/20 2055 09/06/20 2325 09/07/20 0315 09/07/20 0836   BP: (!) 147/72 128/70 (!) 150/97 (!) 157/80  Pulse: (!) 101 98 (!) 103 (!) 109  Resp: 18 19 19 18   Temp: (!) 97.5 F (36.4 C) 98.3 F (36.8 C) 98.1 F (36.7 C) 97.9 F (36.6 C)  TempSrc: Oral Oral Oral Oral  SpO2: 98% 97% 99% 95%  Weight:      Height:       SpO2: 95 %   Intake/Output Summary (Last 24 hours) at 09/07/2020 0908 Last data filed at 09/07/2020 13/07/2020 Gross per 24 hour  Intake 1298 ml  Output 4650 ml  Net -3352 ml   Filed Weights   09/04/20 1100  Weight: 72.9 kg    Exam: General exam: In no acute distress, dishevel. Respiratory system: Good air movement and clear to auscultation. Cardiovascular system: S1 & S2 heard, RRR. No JVD. Gastrointestinal system: Abdomen is nondistended, soft and nontender.  Extremities: No pedal edema. Skin: No rashes, lesions or ulcers Psychiatry: Judgement and insight appear normal.   Data Reviewed:    Labs: Basic Metabolic Panel: Recent Labs  Lab 09/01/20 1244 09/01/20 1244 09/02/20 0157 09/02/20 0157 09/03/20 0208 09/03/20 0208 09/04/20 0504 09/07/20 0653  NA 147*  --  138  --  140  --  139 140  K 3.9   < > 3.6   < > 3.7   < > 4.2 4.4  CL 110  --  105  --  107  --  105 103  CO2 27  --  25  --  26  --  27 28  GLUCOSE 171*  --  114*  --  100*  --  124* 131*  BUN 79*  --  62*  --  34*  --  18 24*  CREATININE 1.42*  --  1.13  --  0.98  --  0.72 0.90  CALCIUM 9.0  --  8.5*  --  8.1*  --  8.2* 9.2   < > = values in this interval not displayed.   GFR Estimated Creatinine Clearance: 74.5 mL/min (by C-G formula based on SCr of 0.9 mg/dL). Liver Function Tests: Recent Labs  Lab 08/31/20 2034 09/01/20 0203 09/03/20 0208 09/04/20 1517 09/07/20 0653  AST 388* 477* 242* 98* 39  ALT 471* 614* 663* 371* 113*  ALKPHOS 73 72 58 70 67  BILITOT 1.8* 1.7* 0.7 0.5 0.6  PROT 7.2 6.8 5.2* 4.9* 6.2*  ALBUMIN 3.7 3.6 2.5* 2.5* 2.7*   No results for input(s): LIPASE, AMYLASE in the last 168 hours. Recent Labs  Lab  08/31/20 2038 09/01/20 0730  AMMONIA 62* 37*   Coagulation profile Recent Labs  Lab 09/01/20 0203  INR 1.2   COVID-19 Labs  No results for input(s): DDIMER, FERRITIN, LDH, CRP in the last 72 hours.  Lab Results  Component Value Date   SARSCOV2NAA NEGATIVE 08/31/2020   SARSCOV2NAA NEGATIVE 09/12/2019   SARSCOV2NAA NEGATIVE 05/03/2019  CBC: Recent Labs  Lab 08/31/20 2034 08/31/20 2034 08/31/20 2247 09/01/20 0203 09/03/20 0208 09/04/20 0504 09/07/20 0653  WBC 19.4*  --   --  18.4* 8.4 8.2 9.5  NEUTROABS 17.0*  --   --  16.2* 5.9 5.8  --   HGB 16.5   < > 12.9* 15.3 11.8* 11.7* 12.6*  HCT 48.8   < > 38.0* 45.0 35.3* 34.6* 37.5*  MCV 91.4  --   --  90.5 91.5 92.5 92.4  PLT 212  --   --  204 125* 139* 184   < > = values in this interval not displayed.   Cardiac Enzymes: Recent Labs  Lab 09/01/20 0134 09/02/20 0157 09/03/20 0208 09/07/20 0653  CKTOTAL 4,614* 3,065* 1,474* 284   BNP (last 3 results) No results for input(s): PROBNP in the last 8760 hours. CBG: Recent Labs  Lab 08/31/20 2024  GLUCAP 178*   D-Dimer: No results for input(s): DDIMER in the last 72 hours. Hgb A1c: No results for input(s): HGBA1C in the last 72 hours. Lipid Profile: No results for input(s): CHOL, HDL, LDLCALC, TRIG, CHOLHDL, LDLDIRECT in the last 72 hours. Thyroid function studies: No results for input(s): TSH, T4TOTAL, T3FREE, THYROIDAB in the last 72 hours.  Invalid input(s): FREET3 Anemia work up: No results for input(s): VITAMINB12, FOLATE, FERRITIN, TIBC, IRON, RETICCTPCT in the last 72 hours. Sepsis Labs: Recent Labs  Lab 09/01/20 0203 09/03/20 0208 09/04/20 0504 09/07/20 0653  WBC 18.4* 8.4 8.2 9.5   Microbiology Recent Results (from the past 240 hour(s))  Respiratory Panel by RT PCR (Flu A&B, Covid) - Nasopharyngeal Swab     Status: None   Collection Time: 08/31/20  8:40 PM   Specimen: Nasopharyngeal Swab  Result Value Ref Range Status   SARS Coronavirus 2  by RT PCR NEGATIVE NEGATIVE Final    Comment: (NOTE) SARS-CoV-2 target nucleic acids are NOT DETECTED.  The SARS-CoV-2 RNA is generally detectable in upper respiratoy specimens during the acute phase of infection. The lowest concentration of SARS-CoV-2 viral copies this assay can detect is 131 copies/mL. A negative result does not preclude SARS-Cov-2 infection and should not be used as the sole basis for treatment or other patient management decisions. A negative result may occur with  improper specimen collection/handling, submission of specimen other than nasopharyngeal swab, presence of viral mutation(s) within the areas targeted by this assay, and inadequate number of viral copies (<131 copies/mL). A negative result must be combined with clinical observations, patient history, and epidemiological information. The expected result is Negative.  Fact Sheet for Patients:  https://www.moore.com/  Fact Sheet for Healthcare Providers:  https://www.young.biz/  This test is no t yet approved or cleared by the [place] FDA and  has been authorized for detection and/or diagnosis of SARS-CoV-2 by FDA under an Emergency Use Authorization (EUA). This EUA will remain  in effect (meaning this test can be used) for the duration of the COVID-19 declaration under Section 564(b)(1) of the Act, 21 U.S.C. section 360bbb-3(b)(1), unless the authorization is terminated or revoked sooner.     Influenza A by PCR NEGATIVE NEGATIVE Final   Influenza B by PCR NEGATIVE NEGATIVE Final    Comment: (NOTE) The Xpert Xpress SARS-CoV-2/FLU/RSV assay is intended as an aid in  the diagnosis of influenza from Nasopharyngeal swab specimens and  should not be used as a sole basis for treatment. Nasal washings and  aspirates are unacceptable for Xpert Xpress SARS-CoV-2/FLU/RSV  testing.  Fact Sheet for Patients: https://www.moore.com/  Fact Sheet  for Healthcare Providers: https://www.young.biz/  This test is not yet approved or cleared by the [place] FDA and  has been authorized for detection and/or diagnosis of SARS-CoV-2 by  FDA under an Emergency Use Authorization (EUA). This EUA will remain  in effect (meaning this test can be used) for the duration of the  Covid-19 declaration under Section 564(b)(1) of the Act, 21  U.S.C. section 360bbb-3(b)(1), unless the authorization is  terminated or revoked. Performed at [facility], [address], [place], [place] [address]   Culture, blood (routine x 2)     Status: Abnormal   Collection Time: 09/01/20  7:30 AM   Specimen: BLOOD  Result Value Ref Range Status   Specimen Description BLOOD SITE NOT SPECIFIED  Final   Special Requests   Final    BOTTLES DRAWN AEROBIC AND ANAEROBIC Blood Culture adequate volume   Culture  Setup Time   Final    AEROBIC BOTTLE ONLY GRAM POSITIVE COCCI CRITICAL VALUE NOTED.  VALUE IS CONSISTENT WITH PREVIOUSLY REPORTED AND CALLED VALUE.    Culture (A)  Final    STAPHYLOCOCCUS HOMINIS SUSCEPTIBILITIES PERFORMED ON PREVIOUS CULTURE WITHIN THE LAST 5 DAYS. Performed at [facility], [address] [place], [place] [address]    Report Status 09/04/2020 FINAL  Final  Culture, blood (routine x 2)     Status: Abnormal   Collection Time: 09/01/20  7:30 AM   Specimen: BLOOD  Result Value Ref Range Status   Specimen Description BLOOD SITE NOT SPECIFIED  Final   Special Requests   Final    BOTTLES DRAWN AEROBIC AND ANAEROBIC Blood Culture adequate volume   Culture  Setup Time   Final    GRAM POSITIVE COCCI IN BOTH AEROBIC AND ANAEROBIC BOTTLES CRITICAL RESULT CALLED TO, READ BACK BY AND VERIFIED WITH: Melven Sartorius [facility] 09/02/20 0132 JDW Performed at [facility], [address] [place], [place] [address]    Culture STAPHYLOCOCCUS HOMINIS (A)  Final   Report Status 09/04/2020 FINAL  Final   Organism ID,  Bacteria STAPHYLOCOCCUS HOMINIS  Final      Susceptibility   Staphylococcus hominis - MIC*    CIPROFLOXACIN <=0.5 SENSITIVE Sensitive     ERYTHROMYCIN >=8 RESISTANT Resistant     GENTAMICIN <=0.5 SENSITIVE Sensitive     OXACILLIN <=0.25 SENSITIVE Sensitive     TETRACYCLINE <=1 SENSITIVE Sensitive     VANCOMYCIN <=0.5 SENSITIVE Sensitive     TRIMETH/SULFA <=10 SENSITIVE Sensitive     CLINDAMYCIN <=0.25 SENSITIVE Sensitive     RIFAMPIN <=0.5 SENSITIVE Sensitive     Inducible Clindamycin NEGATIVE Sensitive     * STAPHYLOCOCCUS HOMINIS  Blood Culture ID Panel (Reflexed)     Status: Abnormal   Collection Time: 09/01/20  7:30 AM  Result Value Ref Range Status   Enterococcus faecalis NOT DETECTED NOT DETECTED Final   Enterococcus Faecium NOT DETECTED NOT DETECTED Final   Listeria monocytogenes NOT DETECTED NOT DETECTED Final   Staphylococcus species DETECTED (A) NOT DETECTED Final    Comment: CRITICAL RESULT CALLED TO, READ BACK BY AND VERIFIED WITH: J LEDFORD [facility] 09/02/20 0132 JDW    Staphylococcus aureus (BCID) NOT DETECTED NOT DETECTED Final   Staphylococcus epidermidis DETECTED (A) NOT DETECTED Final    Comment: Methicillin (oxacillin) resistant coagulase negative staphylococcus. Possible blood culture contaminant (unless isolated from more than one blood culture draw or clinical case suggests pathogenicity). No antibiotic treatment is indicated for blood  culture contaminants. CRITICAL RESULT CALLED TO, READ BACK BY AND VERIFIED WITH: J LEDFORD PHARMD 09/02/20 0132 JDW    Staphylococcus lugdunensis NOT DETECTED NOT DETECTED Final   Streptococcus species NOT DETECTED NOT DETECTED Final   Streptococcus agalactiae NOT DETECTED NOT DETECTED Final   Streptococcus pneumoniae NOT DETECTED NOT DETECTED Final   Streptococcus pyogenes NOT DETECTED NOT DETECTED Final   A.calcoaceticus-baumannii NOT DETECTED NOT DETECTED Final   Bacteroides fragilis NOT DETECTED NOT DETECTED Final    Enterobacterales NOT DETECTED NOT DETECTED Final   Enterobacter cloacae complex NOT DETECTED NOT DETECTED Final   Escherichia coli NOT DETECTED NOT DETECTED Final   Klebsiella aerogenes NOT DETECTED NOT DETECTED Final   Klebsiella oxytoca NOT DETECTED NOT DETECTED Final   Klebsiella pneumoniae NOT DETECTED NOT DETECTED Final   Proteus species NOT DETECTED NOT DETECTED Final   Salmonella species NOT DETECTED NOT DETECTED Final   Serratia marcescens NOT DETECTED NOT DETECTED Final   Haemophilus influenzae NOT DETECTED NOT DETECTED Final   Neisseria meningitidis NOT DETECTED NOT DETECTED Final   Pseudomonas aeruginosa NOT DETECTED NOT DETECTED Final   Stenotrophomonas maltophilia NOT DETECTED NOT DETECTED Final   Candida albicans NOT DETECTED NOT DETECTED Final   Candida auris NOT DETECTED NOT DETECTED Final   Candida glabrata NOT DETECTED NOT DETECTED Final   Candida krusei NOT DETECTED NOT DETECTED Final   Candida parapsilosis NOT DETECTED NOT DETECTED Final   Candida tropicalis NOT DETECTED NOT DETECTED Final   Cryptococcus neoformans/gattii NOT DETECTED NOT DETECTED Final   Methicillin resistance mecA/C DETECTED (A) NOT DETECTED Final    Comment: CRITICAL RESULT CALLED TO, READ BACK BY AND VERIFIED WITHMelven Sartorius: J LEDFORD [facility] 09/02/20 0132 JDW Performed at [facility], [address] [place], [place] [address]   Culture, blood (routine x 2)     Status: None (Preliminary result)   Collection Time: 09/04/20  5:09 AM   Specimen: BLOOD  Result Value Ref Range Status   Specimen Description BLOOD LEFT ANTECUBITAL  Final   Special Requests   Final    BOTTLES DRAWN AEROBIC AND ANAEROBIC Blood Culture adequate volume   Culture   Final    NO GROWTH 3 DAYS Performed at [facility], [address] [place], [place] [address]    Report Status PENDING  Incomplete  Culture, blood (routine x 2)     Status: None (Preliminary result)   Collection Time: 09/04/20  5:13 AM   Specimen:  BLOOD  Result Value Ref Range Status   Specimen Description BLOOD RIGHT ANTECUBITAL  Final   Special Requests   Final    BOTTLES DRAWN AEROBIC AND ANAEROBIC Blood Culture adequate volume   Culture   Final    NO GROWTH 3 DAYS Performed at [facility], [address] [place], [place] [address]    Report Status PENDING  Incomplete     Medications:   . aspirin  324 mg Oral Daily  . atorvastatin  40 mg Oral Daily  . bupivacaine  10 mL Infiltration Once  . Chlorhexidine Gluconate Cloth  6 each Topical Daily  . divalproex  750 mg Oral QHS  . gabapentin  300 mg Oral BID  . haloperidol decanoate  100 mg Intramuscular Q30 days  . losartan  50 mg Oral Daily  . methylPREDNISolone acetate  40 mg Intra-articular Once  . nicotine  14 mg Transdermal Daily  . tamsulosin  0.8 mg Oral QPC breakfast   Continuous Infusions: .  sodium chloride Stopped (09/06/20 0708)  .  ceFAZolin (ANCEF) IV 2 g (09/07/20 0616)      LOS: 6 days   Marinda Elk  [facility]  09/07/2020, 9:08 AM

## 2020-09-07 NOTE — Discharge Summary (Signed)
PATIENT DETAILS Name: Bryan Davies Age: 69 y.o. Sex: male Date of Birth: 02-13-1951 MRN: [id number]. Admitting Physician: Eduard Clos, MD PCP:[facility]  Admit Date: 08/31/2020 Discharge date: 09/07/2020  Recommendations for Outpatient Follow-up:  1. Follow up with PCP in 1-2 weeks 2. Please obtain CMP/CBC in one week 3. Please ensure follow-up with orthopedics-Dr. Dion Saucier in the next few weeks for possible surgical correction of left rotator cuff arthropathy. 4. Needs repeat MRI in 6 to 12 months of abdomen to evaluate for kidney lesion-thought to represent a cyst.   Admitted From:  Home  Disposition: SNF  Home Health: No  Equipment/Devices: None  Discharge Condition: Stable  CODE STATUS: FULL CODE  Diet recommendation:  Diet Order            Diet - low sodium heart healthy           DIET DYS 3 Room service appropriate? Yes with Assist; Fluid consistency: Thin  Diet effective now                  Brief Summary: See H&P, Labs, Consult and Test reports for all details in brief, patient is a 69 year old male with history of CVA, HTN, bipolar disorder found on the floor confused, with left arm weakness-MRI brain did not show a CVA-he was found to have massive left rotator cuff injury causing left arm weakness-subsequently culture results were positive for strep hominis bacteremia.  He was managed with supportive care-he was evaluated by neurology, ID, orthopedics during this hospital stay-he has stabilized-plans are to discharge to SNF.  See below for further details.  Brief Hospital Course: Left arm weakness: Secondary to massive left rotator cuff tear, MRI of the brain negative for CVA.  Per neurology-he could have recrudescence of his prior CVA contributing to some left-sided weakness.  He was evaluated by orthopedics-underwent steroid injections-plans are to follow with Dr. Dion Saucier in the outpatient setting for possible surgical correction once he  is a little bit more stable and less debilitated.  Staphylococcus hominis bacteremia: Repeat blood cultures are negative-echocardiogram was negative for any vegetations-evaluated by infectious disease-on IV Ancef with recommendations to change to cephalexin-and complete a total of 10 days of discharge.  AKI: Likely hemodynamically mediated-with some contributions from rhabdomyolysis-creatinine has normalized with supportive care.  Rhabdomyolysis: Secondary to being down-resolved with supportive care and IV fluids.  Elevated troponins: Thought to be due to AKI/rhabdomyolysis-evaluated by cardiology-no further work-up required.  Echo with preserved EF and without any wall motion abnormalities.  Elevated transaminases: Secondary to rhabdomyolysis-resolving with supportive care.  History of CVA with right ICA stent: No acute CVA on MRI-continue aspirin/statin on discharge.  Bipolar disorder: Stable-continue Depakote  HTN: Controlled-continue losartan  BPH: Continue Flomax   Discharge Diagnoses:  Principal Problem:   Left-sided weakness Active Problems:   Hypertension   Bipolar disorder (HCC)   ARF (acute renal failure) (HCC)   Elevated troponin   Elevated LFTs   Non-ST elevation (NSTEMI) myocardial infarction [facility])   Non-traumatic rhabdomyolysis   Discharge Instructions:  Activity:  As tolerated with Full fall precautions use walker/cane & assistance as needed   Discharge Instructions    Ambulatory referral to Neurology   Complete by: As directed    Follow up with stroke clinic NP (Jessica Vanschaick or Darrol Angel, if both not available, consider Manson Allan, or Ahern) at [facility] in about 4 weeks. Thanks.   Call MD for:  extreme fatigue   Complete by: As directed  Call MD for:  persistant dizziness or light-headedness   Complete by: As directed    Diet - low sodium heart healthy   Complete by: As directed    Increase activity slowly   Complete by: As directed       Allergies as of 09/07/2020      Reactions   Pollen Extract Other (See Comments)   Sneezing      Medication List    STOP taking these medications   ticagrelor 90 MG Tabs tablet Commonly known as: BRILINTA     TAKE these medications   aspirin 81 MG chewable tablet Chew 4 tablets (324 mg total) by mouth daily. Start taking on: September 08, 2020 What changed: how much to take   atorvastatin 40 MG tablet Commonly known as: LIPITOR Take 1 tablet (40 mg total) by mouth daily.   Austedo 12 MG Tabs Generic drug: Deutetrabenazine Take 18 mg by mouth in the morning and at bedtime. What changed: how much to take   cephALEXin 500 MG capsule Commonly known as: KEFLEX Take 1 capsule (500 mg total) by mouth 4 (four) times daily for 5 days.   divalproex 250 MG 24 hr tablet Commonly known as: DEPAKOTE ER TAKE THREE TABLETS (750 MG TOTAL) BY MOUTH AT BEDTIME. What changed: See the new instructions.   gabapentin 300 MG capsule Commonly known as: NEURONTIN Take 1 capsule (300 mg total) by mouth 2 (two) times daily.   losartan 50 MG tablet Commonly known as: COZAAR Take 1 tablet (50 mg total) by mouth daily.   tamsulosin 0.4 MG Caps capsule Commonly known as: FLOMAX Take 2 capsules (0.8 mg total) by mouth daily. What changed: how much to take       Contact information for follow-up providers    Christell Constant, MD. Go on 09/26/2020.   Specialty: Cardiology Why: @10 :20am for hospital follow up  Contact information: [address] [place] [address] [phone]        [facility]. Schedule an appointment as soon as possible for a visit in 4 week(s).   Specialty: Neurology Contact information: [address] [place] [address] [phone]       Teryl Lucy, MD. Schedule an appointment as soon as possible for a visit in 2 week(s).   Specialty: Orthopedic Surgery Why: For follow up regarding left  shoulder Contact information: [address] [place] [address] [phone]            Contact information for after-discharge care    Destination    [facility] Preferred SNF .   Service: Skilled Nursing Contact information: [address] [place] [address] [phone]                 Allergies  Allergen Reactions  . Pollen Extract Other (See Comments)    Sneezing       Consultations:   cardiology, ID and neurology   Other Procedures/Studies: CT Angio Head W or Wo Contrast  Result Date: 08/31/2020 CLINICAL DATA:  Initial evaluation for possible stroke, left-sided deficits. EXAM: CT ANGIOGRAPHY HEAD AND NECK TECHNIQUE: Multidetector CT imaging of the head and neck was performed using the standard protocol during bolus administration of intravenous contrast. Multiplanar CT image reconstructions and MIPs were obtained to evaluate the vascular anatomy. Carotid stenosis measurements (when applicable) are obtained utilizing NASCET criteria, using the distal internal carotid diameter as the denominator. CONTRAST:  OMNIPAQUE IOHEXOL  350 MG/ML SOLN COMPARISON:  Prior study from 01/15/2019. FINDINGS: CT HEAD FINDINGS Brain: Generalized age-related cerebral atrophy with mild chronic small vessel ischemic disease. Encephalomalacia involving the left frontal lobe compatible with chronic left MCA territory infarct. No acute intracranial hemorrhage. No acute large vessel territory infarct. No mass lesion, midline shift or mass effect. No hydrocephalus or extra-axial fluid collection. Vascular: No hyperdense vessel. Scattered vascular calcifications noted within the carotid siphons. Skull: Scalp soft tissues and calvarium within normal limits. Sinuses: Paranasal sinuses are largely clear. No significant mastoid effusion. Orbits: Globes and orbital soft tissues within normal limits. Review of the MIP images confirms the above  findings CTA NECK FINDINGS Aortic arch: Visualized aortic arch of normal caliber with normal 3 vessel morphology. Moderate atheromatous change about the arch and origins of the great vessels without high-grade stenosis. Right carotid system: Right common carotid artery widely patent proximally. Eccentric calcified plaque at the distal right CCA with no more than mild stenosis. Vascular stent in place across the right carotid bifurcation. Small focus of intraluminal soft plaque/thrombus within the stent itself with short-segment 40-50% stenosis (series 8, image 165). Flow within the stent otherwise widely patent. Right ICA widely patent distally without stenosis, dissection or occlusion. Left carotid system: Calcified plaque at the origin of the left CCA with associated stenosis of up to 50% by NASCET criteria. Left CCA otherwise patent distally to the bifurcation without significant stenosis. Mild scattered atheromatous plaque about the left bifurcation without flow-limiting stenosis. Left ICA patent distally to the skull base without significant stenosis, dissection or occlusion. Vertebral arteries: Both vertebral arteries arise from the subclavian arteries. Right vertebral artery dominant, with a diffusely hypoplastic left vertebral artery. Atheromatous change at the origin of the diminutive left vertebral artery with focal severe ostial stenosis (series 8, image 77). Additional scattered mild nonstenotic plaque within the vertebral arteries bilaterally without additional high-grade stenosis, dissection or occlusion. Skeleton: Better evaluated on concomitant CT of the cervical spine. Multilevel cervical spondylosis noted. No worrisome osseous lesions. Other neck: No other acute soft tissue abnormality within the neck. Upper chest: Visualized upper chest demonstrates no acute finding. Centrilobular and paraseptal emphysematous changes noted. Review of the MIP images confirms the above findings CTA HEAD FINDINGS  Anterior circulation: Petrous segments patent bilaterally. Scattered atheromatous plaque within the carotid siphons without hemodynamically significant stenosis. A1 segments widely patent. Normal anterior communicating artery complex. Anterior cerebral arteries patent to their distal aspects without stenosis. No M1 stenosis or occlusion. Normal MCA bifurcations. Distal MCA branches well perfused and symmetric. Posterior circulation: Both vertebral arteries patent to the vertebrobasilar junction without stenosis. Right PICA patent. Left PICA not seen. Basilar patent to its distal aspect without stenosis. Superior cerebellar and posterior cerebral arteries patent bilaterally. Venous sinuses: Grossly patent allowing for timing the contrast bolus. Anatomic variants: None significant.  No aneurysm. Review of the MIP images confirms the above findings IMPRESSION: CT HEAD IMPRESSION: 1. No acute intracranial abnormality. 2. Chronic right frontal lobe infarct, right MCA distribution. 3. Underlying age-related cerebral atrophy with mild chronic small vessel ischemic disease. CTA HEAD AND NECK IMPRESSION: 1. Negative CTA for large vessel occlusion. 2. Short-segment 50% stenosis at the origin of the left common carotid artery. 3. Vascular stent in place across the right carotid bifurcation. Small focus of intraluminal soft plaque/thrombus with associated short-segment stenosis of up to approximately 40-50% by NASCET criteria. Flow within the stent otherwise widely patent. 4. 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 Right  vertebral artery dominant. 5. Additional scattered atherosclerotic change elsewhere about the major arterial vasculature of the head and neck as above. No other hemodynamically significant or correctable stenosis. 6. Emphysema (ICD10-J43.9). Electronically Signed   By: Rise Mu M.D.   On: 08/31/2020 23:55   DG Chest 2 View  Result Date: 08/10/2020 CLINICAL  DATA:  Patient states he is walking slow today and his chest hurts. Endorses chest pain that started when he was walking to the [employer] today around 5pm, also endorses SO EXAM: CHEST - 2 VIEW COMPARISON:  Chest x-ray 05/18/2020 FINDINGS: The heart size and mediastinal contours are within normal limits. Aortic arch calcifications. No focal consolidation. Biapical pleural/pulmonary scarring is again noted. Similar-appearing peribronchial thickening within the hilar regions. Increased interstitial markings again noted with no overt pulmonary edema. No pleural effusion. No pneumothorax. No acute osseous abnormality. IMPRESSION: No active cardiopulmonary disease. Electronically Signed   By: Tish Frederickson M.D.   On: 08/10/2020 20:12   DG Shoulder Right  Result Date: 09/01/2020 CLINICAL DATA:  Unable to move right shoulder. EXAM: RIGHT SHOULDER - 2+ VIEW COMPARISON:  None. FINDINGS: There is no evidence of fracture or dislocation. Moderate degenerative changes seen involving the right acromioclavicular joint. Soft tissues are unremarkable. 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 No acute abnormality seen in the right shoulder. Electronically Signed   By: Lupita Raider M.D.   On: 09/01/2020 09:20   CT Angio Neck W and/or Wo Contrast  Result Date: 08/31/2020 CLINICAL DATA:  Initial evaluation for possible stroke, left-sided deficits. EXAM: CT ANGIOGRAPHY HEAD AND NECK TECHNIQUE: Multidetector CT imaging of the head and neck was performed using the standard protocol during bolus administration of intravenous contrast. Multiplanar CT image reconstructions and MIPs were obtained to evaluate the vascular anatomy. Carotid stenosis measurements (when applicable) are obtained utilizing NASCET criteria, using the distal internal carotid diameter as the denominator. CONTRAST:  OMNIPAQUE IOHEXOL 350 MG/ML SOLN COMPARISON:  Prior study from 01/15/2019. FINDINGS: CT HEAD  FINDINGS Brain: Generalized age-related cerebral atrophy with mild chronic small vessel ischemic disease. Encephalomalacia involving the left frontal lobe compatible with chronic left MCA territory infarct. No acute intracranial hemorrhage. No acute large vessel territory infarct. No mass lesion, midline shift or mass effect. No hydrocephalus or extra-axial fluid collection. Vascular: No hyperdense vessel. Scattered vascular calcifications noted within the carotid siphons. Skull: Scalp soft tissues and calvarium within normal limits. Sinuses: Paranasal sinuses are largely clear. No significant mastoid effusion. Orbits: Globes and orbital soft tissues within normal limits. Review of the MIP images confirms the above findings CTA NECK FINDINGS Aortic arch: Visualized aortic arch of normal caliber with normal 3 vessel morphology. Moderate atheromatous change about the arch and origins of the great vessels without high-grade stenosis. Right carotid system: Right common carotid artery widely patent proximally. Eccentric calcified plaque at the distal right CCA with no more than mild stenosis. Vascular stent in place across the right carotid bifurcation. Small focus of intraluminal soft plaque/thrombus within the stent itself with short-segment 40-50% stenosis (series 8, image 165). Flow within the stent otherwise widely patent. Right ICA widely patent distally without stenosis, dissection or occlusion. Left carotid system: Calcified plaque at the origin of the left CCA with associated stenosis of up to 50% by NASCET criteria. Left CCA otherwise patent distally to the bifurcation without significant stenosis. Mild scattered atheromatous plaque about the left bifurcation without flow-limiting stenosis. Left ICA patent distally to the skull base without significant stenosis,  dissection or occlusion. Vertebral arteries: Both vertebral arteries arise from the subclavian arteries. Right vertebral artery dominant, with a  diffusely hypoplastic left vertebral artery. Atheromatous change at the origin of the diminutive left vertebral artery with focal severe ostial stenosis (series 8, image 77). Additional scattered mild nonstenotic plaque within the vertebral arteries bilaterally without additional high-grade stenosis, dissection or occlusion. Skeleton: Better evaluated on concomitant CT of the cervical spine. Multilevel cervical spondylosis noted. No worrisome osseous lesions. Other neck: No other acute soft tissue abnormality within the neck. Upper chest: Visualized upper chest demonstrates no acute finding. Centrilobular and paraseptal emphysematous changes noted. Review of the MIP images confirms the above findings CTA HEAD FINDINGS Anterior circulation: Petrous segments patent bilaterally. Scattered atheromatous plaque within the carotid siphons without hemodynamically significant stenosis. A1 segments widely patent. Normal anterior communicating artery complex. Anterior cerebral arteries patent to their distal aspects without stenosis. No M1 stenosis or occlusion. Normal MCA bifurcations. Distal MCA branches well perfused and symmetric. Posterior circulation: Both vertebral arteries patent to the vertebrobasilar junction without stenosis. Right PICA patent. Left PICA not seen. Basilar patent to its distal aspect without stenosis. Superior cerebellar and posterior cerebral arteries patent bilaterally. Venous sinuses: Grossly patent allowing for timing the contrast bolus. Anatomic variants: None significant.  No aneurysm. Review of the MIP images confirms the above findings IMPRESSION: CT HEAD IMPRESSION: 1. No acute intracranial abnormality. 2. Chronic right frontal lobe infarct, right MCA distribution. 3. Underlying age-related cerebral atrophy with mild chronic small vessel ischemic disease. CTA HEAD AND NECK IMPRESSION: 1. Negative CTA for large vessel occlusion. 2. Short-segment 50% stenosis at the origin of the left common  carotid artery. 3. Vascular stent in place across the right carotid bifurcation. Small focus of intraluminal soft plaque/thrombus with associated short-segment stenosis of up to approximately 40-50% by NASCET criteria. Flow within the stent otherwise widely patent. 4. 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 Right vertebral artery dominant. 5. Additional scattered atherosclerotic change elsewhere about the major arterial vasculature of the head and neck as above. No other hemodynamically significant or correctable stenosis. 6. Emphysema (ICD10-J43.9). Electronically Signed   By: Rise Mu M.D.   On: 08/31/2020 23:55   CT Cervical Spine Wo Contrast  Result Date: 08/31/2020 CLINICAL DATA:  Found down, weakness EXAM: CT CERVICAL SPINE WITHOUT CONTRAST TECHNIQUE: Multidetector CT imaging of the cervical spine was performed without intravenous contrast. Multiplanar CT image reconstructions were also generated. COMPARISON:  None. FINDINGS: Alignment: Alignment is anatomic. Skull base and vertebrae: No acute displaced fracture. Soft tissues and spinal canal: No prevertebral fluid or swelling. No visible canal hematoma. Stent is identified within the right carotid artery. Disc levels: Multilevel cervical spondylosis greatest at C4-5, C5-6, and C6-7. Symmetrical neural foraminal encroachment is seen at these levels. Upper chest: Airways patent. Emphysematous changes are seen at the lung apices. Other: Reconstructed images demonstrate no additional findings. IMPRESSION: 1. Lower cervical spondylosis.  No acute fracture. Electronically Signed   By: Sharlet Salina M.D.   On: 08/31/2020 22:51   MR BRAIN WO CONTRAST  Result Date: 09/02/2020 CLINICAL DATA:  Stroke, follow-up. EXAM: MRI HEAD WITHOUT CONTRAST TECHNIQUE: Multiplanar, multiecho pulse sequences of the brain and surrounding structures were obtained without intravenous contrast. COMPARISON:  Brain MRI 09/01/2020.  FINDINGS: An abbreviated protocol brain was performed at the ordering provider's request. Only axial and coronal diffusion-weighted sequences and an axial SWI sequence were obtained. There is no evidence of acute infarction. Apparent punctate focus  of restricted diffusion within the right cerebellar hemisphere appreciated on the axial DWI sequence only (series 5, image 68). This finding is not appreciated on the coronal DWI sequence and likely reflects image noise. Otherwise, there is no appreciable interval change as compared to the brain MRI of 09/01/2020. 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 Chronic hemosiderin staining at this site. Remote right thalamic lacunar infarct. Generalized cerebral atrophy. IMPRESSION: Abbreviated protocol brain MRI consisting of only diffusion-weighted and SWI sequences. No evidence of acute infarction. Electronically Signed   By: Jackey Loge DO   On: 09/02/2020 18:25   MR BRAIN WO CONTRAST  Result Date: 09/01/2020 CLINICAL DATA:  Initial evaluation for neuro deficit, stroke suspected. Left-sided weakness. EXAM: MRI HEAD WITHOUT CONTRAST TECHNIQUE: Multiplanar, multiecho pulse sequences of the brain and surrounding structures were obtained without intravenous contrast. COMPARISON:  Prior CTA from 08/31/2020. FINDINGS: Brain: Diffuse prominence of the CSF containing spaces compatible generalized age-related cerebral atrophy. Scattered patchy T2/FLAIR hyperintensity within the periventricular white matter most consistent with chronic small vessel ischemic disease, mild for age. Encephalomalacia and gliosis involving the right frontoparietal region consistent with a chronic right MCA territory infarct. Associated scattered chronic hemosiderin staining present within this region as well. Additional small remote lacunar infarct present at the right thalamus. No abnormal foci of restricted diffusion to suggest acute or subacute ischemia. Gray-white  matter differentiation maintained. No other areas of remote cortical infarction. No evidence for acute intracranial hemorrhage. No mass lesion, midline shift or mass effect. No hydrocephalus or extra-axial fluid collection. Pituitary gland and suprasellar region within normal limits. Midline structures intact. Vascular: Major intracranial vascular flow voids are maintained. Skull and upper cervical spine: Craniocervical junction within normal limits. Bone marrow signal intensity normal. No scalp soft tissue abnormality. Sinuses/Orbits: Globes and orbital soft tissues within normal limits. Paranasal sinuses are largely clear. No significant mastoid effusion. Inner ear structures grossly normal. Other: None. IMPRESSION: 1. No acute intracranial infarct or other abnormality. 2. Chronic right MCA territory infarct, with additional remote lacunar infarct involving the right thalamus. 3. Underlying age-related cerebral atrophy with mild chronic small vessel ischemic disease. Electronically Signed   By: Rise Mu M.D.   On: 09/01/2020 03:49   MR ABDOMEN W WO CONTRAST  Result Date: 09/03/2020 CLINICAL DATA:  Evaluate kidney mass EXAM: MRI ABDOMEN WITHOUT AND WITH CONTRAST TECHNIQUE: Multiplanar multisequence MR imaging of the abdomen was performed both before and after the administration of intravenous contrast. CONTRAST:  6.14mL GADAVIST GADOBUTROL 1 MMOL/ML IV SOLN COMPARISON:  CT AP 09/01/2020 FINDINGS: Lower chest: No acute findings. Hepatobiliary: No suspicious liver abnormality identified. Stone within the gallbladder neck measures 1 cm, image 17/5. No signs of gallbladder wall inflammation or biliary ductal dilatation. Pancreas:  No main duct dilatation, inflammation or mass. Spleen:  Within normal limits in size and appearance. Adrenals/Urinary Tract:  Adrenal glands are unremarkable. Horseshoe kidney identified. Corresponding to the CT abnormality is a well-circumscribed T2 hyperintense lesion arising  off the anteromedial cortex of the left kidney measuring 1.1 x 0.9 cm. No internal septation or mural nodule noted. On the postcontrast images this lesion appears blurred by respiratory motion artifact limiting evaluation for evidence of internal enhancement/complexity. No additional focal kidney lesions identified. Mild bilateral perinephric fat stranding is identified which is unchanged from previous exam and may be related to prior insult. No hydronephrosis. Stomach/Bowel: Visualized portions within the abdomen are unremarkable. Vascular/Lymphatic:  No aneurysm.  No abdominal adenopathy. Other:  No significant free fluid or fluid  collections. Musculoskeletal: No suspicious bone lesions identified. Note: Diminished exam detail secondary to respiratory motion artifact. IMPRESSION: 1. Diminished exam detail due to respiratory motion artifact. 2. Horseshoe kidney. 3. Corresponding to the CT abnormality is a unilocular, uniformly T2 hyperintense cyst arising off the anteromedial cortex of the left kidney. No signs of internal complexity on the precontrast T2 weighted images. This is obscured on the postcontrast imaging limiting assessment for internal enhancement. Given the small size of this lesion and the uniformly T2 hyperintense this is favored to represent a simple cyst but is technically incompletely characterized. Consider follow-up imaging and 6 to 12 months with renal protocol MRI or CT without and with contrast material. In a patient who may have difficulty remaining motionless, CT may be more appropriate as this is less susceptible to motion artifact. 4. Horseshoe kidney. 5. Gallstone. Electronically Signed   By: Signa Kell M.D.   On: 09/03/2020 04:51   MR SHOULDER LEFT WO CONTRAST  Result Date: 09/04/2020 CLINICAL DATA:  Left shoulder pain and weakness. EXAM: MRI OF THE LEFT SHOULDER WITHOUT CONTRAST TECHNIQUE: Multiplanar, multisequence MR imaging of the shoulder was performed. No intravenous  contrast was administered. COMPARISON:  Left shoulder x-rays dated September 01, 2020. FINDINGS: Despite efforts by the technologist and patient, motion artifact is present on today's exam and could not be eliminated. This severely reduces exam sensitivity and specificity. Rotator cuff: Full-thickness, full width tears of the supraspinatus, infraspinatus, and subscapularis tendons. The teres minor is intact. Muscles: Moderate subscapularis and mild infraspinatus muscle atrophy. Biceps long head:  Appears intact, but is medially dislocated. Acromioclavicular Joint: Mild arthropathy of the acromioclavicular joint. Type II acromion. Moderate amount of subacromial/subdeltoid bursal fluid. Glenohumeral Joint: Small joint effusion. Scattered mild partial-thickness cartilage loss. Labrum:  Grossly intact. Bones: High-riding humeral head. No acute fracture or dislocation. No suspicious bone lesion. Other: None. IMPRESSION: 1. Severely limited study due to motion artifact. 2. Massive rotator cuff tear. Full-thickness, full width tears of the supraspinatus, infraspinatus, and subscapularis tendons. Moderate subscapularis and mild infraspinatus muscle atrophy. 3. Medial dislocation of the biceps tendon. 4. Mild acromioclavicular and glenohumeral osteoarthritis. Electronically Signed   By: Obie Dredge M.D.   On: 09/04/2020 09:09   DG Chest Portable 1 View  Result Date: 08/31/2020 CLINICAL DATA:  Altered mental status. Left-sided weakness and slurred speech. EXAM: PORTABLE CHEST 1 VIEW COMPARISON:  Radiograph 08/10/2020 FINDINGS: The cardiomediastinal contours are normal. Atherosclerosis of the thoracic aorta. Improved bronchial thickening from prior exam. Chronic biapical pleuroparenchymal scarring. Pulmonary vasculature is normal. No consolidation, pleural effusion, or pneumothorax. No acute osseous abnormalities are seen. IMPRESSION: No acute chest findings. Electronically Signed   By: Narda Rutherford M.D.   On:  08/31/2020 21:08   DG Shoulder Left  Result Date: 09/01/2020 CLINICAL DATA:  Stroke symptoms. EXAM: LEFT SHOULDER - 2+ VIEW COMPARISON:  Prior study same day. FINDINGS: Acromioclavicular and glenohumeral degenerative change. No acute bony abnormality identified. No evidence of fracture or dislocation. High-riding left shoulder consistent with rotator cuff tear. IMPRESSION: 1. Acromioclavicular and glenohumeral degenerative change. No acute bony abnormality. 2. High-riding left shoulder consistent with rotator cuff tear. Electronically Signed   By: Maisie Fus  Register   On: 09/01/2020 09:20   ECHOCARDIOGRAM COMPLETE  Result Date: 09/01/2020    ECHOCARDIOGRAM REPORT   Patient Name:   Bryan Davies [facility] Date of Exam: 09/01/2020 Medical Rec #:  [id number]     Height:       67.0 in Accession #:  [id number]    Weight:       148.0 lb Date of Birth:  1950-11-24     BSA:          1.779 m Patient Age:    37 years      BP:           102/67 mmHg Patient Gender: M             HR:           95 bpm. Exam Location:  Inpatient Procedure: 2D Echo, Cardiac Doppler and Color Doppler Indications:    Elevated troponin  History:        Patient has prior history of Echocardiogram examinations, most                 recent 01/16/2019. Risk Factors:Hypertension, Dyslipidemia and                 Current Smoker. Elevated troponin.  Sonographer:    Renella Cunas RDCS Referring Phys: [id number] Meryle Ready [facility]  Sonographer Comments: Patient unable to turn on side. IMPRESSIONS  1. Left ventricular ejection fraction, by estimation, is 65 to 70%. The left ventricle has normal function. The left ventricle has no regional wall motion abnormalities. Left ventricular diastolic parameters are consistent with Grade I diastolic dysfunction (impaired relaxation).  2. Right ventricular systolic function is normal. The right ventricular size is normal. There is normal pulmonary artery systolic pressure.  3. The mitral valve is normal in structure. No evidence of  mitral valve regurgitation. No evidence of mitral stenosis.  4. The aortic valve is normal in structure. Aortic valve regurgitation is not visualized. Mild to moderate aortic valve sclerosis/calcification is present, without any evidence of aortic stenosis.  5. The inferior vena cava is normal in size with greater than 50% respiratory variability, suggesting right atrial pressure of 3 mmHg. FINDINGS  Left Ventricle: Left ventricular ejection fraction, by estimation, is 65 to 70%. The left ventricle has normal function. The left ventricle has no regional wall motion abnormalities. The left ventricular internal cavity size was normal in size. There is  no left ventricular hypertrophy. Left ventricular diastolic parameters are consistent with Grade I diastolic dysfunction (impaired relaxation). Normal left ventricular filling pressure. Right Ventricle: The right ventricular size is normal. No increase in right ventricular wall thickness. Right ventricular systolic function is normal. There is normal pulmonary artery systolic pressure. Left Atrium: Left atrial size was normal in size. Right Atrium: Right atrial size was normal in size. Pericardium: There is no evidence of pericardial effusion. Mitral Valve: The mitral valve is normal in structure. No evidence of mitral valve regurgitation. No evidence of mitral valve stenosis. Tricuspid Valve: The tricuspid valve is normal in structure. Tricuspid valve regurgitation is mild . No evidence of tricuspid stenosis. Aortic Valve: The aortic valve is normal in structure. Aortic valve regurgitation is not visualized. Mild to moderate aortic valve sclerosis/calcification is present, without any evidence of aortic stenosis. Pulmonic Valve: The pulmonic valve was normal in structure. Pulmonic valve regurgitation is not visualized. No evidence of pulmonic stenosis. Aorta: The aortic root is normal in size and structure. Venous: The inferior vena cava is normal in size with greater  than 50% respiratory variability, suggesting right atrial pressure of 3 mmHg. IAS/Shunts: No atrial level shunt detected by color flow Doppler.  LEFT VENTRICLE PLAX 2D LVIDd:         4.00 cm     Diastology LVIDs:  3.00 cm     LV e' medial:    4.38 cm/s LV PW:         0.70 cm     LV E/e' medial:  11.1 LV IVS:        0.70 cm     LV e' lateral:   5.26 cm/s LVOT diam:     2.00 cm     LV E/e' lateral: 9.3 LV SV:         65 LV SV Index:   37 LVOT Area:     3.14 cm  LV Volumes (MOD) LV vol d, MOD A4C: 51.1 ml LV vol s, MOD A4C: 25.9 ml LV SV MOD A4C:     51.1 ml RIGHT VENTRICLE RV S prime:     9.73 cm/s TAPSE (M-mode): 1.7 cm LEFT ATRIUM           Index      RIGHT ATRIUM          Index LA diam:      2.30 cm 1.29 cm/m RA Area:     6.27 cm LA Vol (A2C): 13.1 ml 7.36 ml/m RA Volume:   9.92 ml  5.58 ml/m LA Vol (A4C): 11.4 ml 6.41 ml/m  AORTIC VALVE LVOT Vmax:   123.00 cm/s LVOT Vmean:  82.300 cm/s LVOT VTI:    0.207 m  AORTA Ao Root diam: 2.90 cm MITRAL VALVE MV Area (PHT): 3.27 cm    SHUNTS MV Decel Time: 232 msec    Systemic VTI:  0.21 m MV E velocity: 48.80 cm/s  Systemic Diam: 2.00 cm MV A velocity: 67.70 cm/s MV E/A ratio:  0.72 Tobias Alexander MD Electronically signed by Tobias Alexander MD Signature Date/Time: 09/01/2020/2:21:14 PM    Final    CT RENAL STONE STUDY  Result Date: 09/01/2020 CLINICAL DATA:  Flank pain. EXAM: CT ABDOMEN AND PELVIS WITHOUT CONTRAST TECHNIQUE: Multidetector CT imaging of the abdomen and pelvis was performed following the standard protocol without IV contrast. COMPARISON:  Abdomen and pelvis radiographs dated 08/15/2009. FINDINGS: Lower chest: Unremarkable. Hepatobiliary: 6 mm probable noncalcified gallstone in the gallbladder. No gallbladder wall thickening or pericholecystic fluid. Unremarkable liver. Pancreas: Unremarkable. No pancreatic ductal dilatation or surrounding inflammatory changes. Spleen: Normal in size without focal abnormality. Adrenals/Urinary Tract: Normal  appearing adrenal glands. Horseshoe kidney. 1.0 cm rounded low density mass in the anterior aspect of the kidney on the left. This measures 51 Hounsfield units in density on image number 36 series 3. No urinary tract calculi or hydronephrosis. Stomach/Bowel: Unremarkable stomach, small bowel and colon. No evidence of appendicitis. Vascular/Lymphatic: Atheromatous arterial calcifications without aneurysm. No enlarged lymph nodes. Reproductive: Mildly enlarged prostate gland. Other: No abdominal wall hernia or abnormality. No abdominopelvic ascites. Musculoskeletal: Lumbar and lower thoracic spine degenerative changes. IMPRESSION: 1. No acute abnormality. 2. Horseshoe kidney. 3. 1.0 cm rounded low density mass in the anterior aspect of the kidney on the left. This could represent a complicated cyst or solid mass. Further evaluation with pre and post contrast MRI should be considered. Pre and post contrast CT could alternatively be performed, but would likely be of decreased accuracy given lesion size. 4. 6 mm probable noncalcified gallstone in the gallbladder. 5. Mildly enlarged prostate gland. Electronically Signed   By: Beckie Salts M.D.   On: 09/01/2020 08:32     TODAY-DAY OF DISCHARGE:  Subjective:   Linford Arnold today has no headache,no chest abdominal pain,no new weakness tingling or numbness, feels much better wants to  go home today.   Objective:   Blood pressure 110/60, pulse (!) 102, temperature 97.8 F (36.6 C), temperature source Oral, resp. rate 18, height 5\' 7"  (1.702 m), weight 72.9 kg, SpO2 95 %.  Intake/Output Summary (Last 24 hours) at 09/07/2020 1109 Last data filed at 09/07/2020 0227 Gross per 24 hour  Intake 472 ml  Output 4000 ml  Net -3528 ml   Filed Weights   09/04/20 1100  Weight: 72.9 kg    Exam: Awake Alert, Oriented *3, No new F.N deficits, Normal affect [place].AT,PERRAL Supple Neck,No JVD, No cervical lymphadenopathy appriciated.  Symmetrical Chest wall movement,  Good air movement bilaterally, CTAB RRR,No Gallops,Rubs or new Murmurs, No Parasternal Heave +ve B.Sounds, Abd Soft, Non tender, No organomegaly appriciated, No rebound -guarding or rigidity. No Cyanosis, Clubbing or edema, No new Rash or bruise   PERTINENT RADIOLOGIC STUDIES: No results found.   PERTINENT LAB RESULTS: CBC: Recent Labs    09/07/20 0653  WBC 9.5  HGB 12.6*  HCT 37.5*  PLT 184   CMET CMP     Component Value Date/Time   NA 140 09/07/2020 0653   K 4.4 09/07/2020 0653   CL 103 09/07/2020 0653   CO2 28 09/07/2020 0653   GLUCOSE 131 (H) 09/07/2020 0653   BUN 24 (H) 09/07/2020 0653   CREATININE 0.90 09/07/2020 0653   CREATININE 0.79 02/11/2014 1141   CALCIUM 9.2 09/07/2020 0653   PROT 6.2 (L) 09/07/2020 0653   ALBUMIN 2.7 (L) 09/07/2020 0653   AST 39 09/07/2020 0653   ALT 113 (H) 09/07/2020 0653   ALKPHOS 67 09/07/2020 0653   BILITOT 0.6 09/07/2020 0653   GFRNONAA >60 09/07/2020 0653   GFRAA >60 05/18/2020 1716    GFR Estimated Creatinine Clearance: 74.5 mL/min (by C-G formula based on SCr of 0.9 mg/dL). No results for input(s): LIPASE, AMYLASE in the last 72 hours. Recent Labs    09/07/20 0653  CKTOTAL 284   Invalid input(s): POCBNP No results for input(s): DDIMER in the last 72 hours. No results for input(s): HGBA1C in the last 72 hours. No results for input(s): CHOL, HDL, LDLCALC, TRIG, CHOLHDL, LDLDIRECT in the last 72 hours. No results for input(s): TSH, T4TOTAL, T3FREE, THYROIDAB in the last 72 hours.  Invalid input(s): FREET3 No results for input(s): VITAMINB12, FOLATE, FERRITIN, TIBC, IRON, RETICCTPCT in the last 72 hours. Coags: No results for input(s): INR in the last 72 hours.  Invalid input(s): PT Microbiology: Recent Results (from the past 240 hour(s))  Respiratory Panel by RT PCR (Flu A&B, Covid) - Nasopharyngeal Swab     Status: None   Collection Time: 08/31/20  8:40 PM   Specimen: Nasopharyngeal Swab  Result Value Ref Range  Status   SARS Coronavirus 2 by RT PCR NEGATIVE NEGATIVE Final    Comment: (NOTE) SARS-CoV-2 target nucleic acids are NOT DETECTED.  The SARS-CoV-2 RNA is generally detectable in upper respiratoy specimens during the acute phase of infection. The lowest concentration of SARS-CoV-2 viral copies this assay can detect is 131 copies/mL. A negative result does not preclude SARS-Cov-2 infection and should not be used as the sole basis for treatment or other patient management decisions. A negative result may occur with  improper specimen collection/handling, submission of specimen other than nasopharyngeal swab, presence of viral mutation(s) within the areas targeted by this assay, and inadequate number of viral copies (<131 copies/mL). A negative result must be combined with clinical observations, patient history, and epidemiological information. The expected result is Negative.  Fact Sheet for Patients:  https://www.moore.com/  Fact Sheet for Healthcare Providers:  https://www.young.biz/  This test is no t yet approved or cleared by the [place] FDA and  has been authorized for detection and/or diagnosis of SARS-CoV-2 by FDA under an Emergency Use Authorization (EUA). This EUA will remain  in effect (meaning this test can be used) for the duration of the COVID-19 declaration under Section 564(b)(1) of the Act, 21 U.S.C. section 360bbb-3(b)(1), unless the authorization is terminated or revoked sooner.     Influenza A by PCR NEGATIVE NEGATIVE Final   Influenza B by PCR NEGATIVE NEGATIVE Final    Comment: (NOTE) The Xpert Xpress SARS-CoV-2/FLU/RSV assay is intended as an aid in  the diagnosis of influenza from Nasopharyngeal swab specimens and  should not be used as a sole basis for treatment. Nasal washings and  aspirates are unacceptable for Xpert Xpress SARS-CoV-2/FLU/RSV  testing.  Fact Sheet for  Patients: https://www.moore.com/  Fact Sheet for Healthcare Providers: https://www.young.biz/  This test is not yet approved or cleared by the [place] FDA and  has been authorized for detection and/or diagnosis of SARS-CoV-2 by  FDA under an Emergency Use Authorization (EUA). This EUA will remain  in effect (meaning this test can be used) for the duration of the  Covid-19 declaration under Section 564(b)(1) of the Act, 21  U.S.C. section 360bbb-3(b)(1), unless the authorization is  terminated or revoked. Performed at [facility], [address], [place], [place] [address]   Culture, blood (routine x 2)     Status: Abnormal   Collection Time: 09/01/20  7:30 AM   Specimen: BLOOD  Result Value Ref Range Status   Specimen Description BLOOD SITE NOT SPECIFIED  Final   Special Requests   Final    BOTTLES DRAWN AEROBIC AND ANAEROBIC Blood Culture adequate volume   Culture  Setup Time   Final    AEROBIC BOTTLE ONLY GRAM POSITIVE COCCI CRITICAL VALUE NOTED.  VALUE IS CONSISTENT WITH PREVIOUSLY REPORTED AND CALLED VALUE.    Culture (A)  Final    STAPHYLOCOCCUS HOMINIS SUSCEPTIBILITIES PERFORMED ON PREVIOUS CULTURE WITHIN THE LAST 5 DAYS. Performed at [facility], [address], [place], [place] [address]    Report Status 09/04/2020 FINAL  Final  Culture, blood (routine x 2)     Status: Abnormal   Collection Time: 09/01/20  7:30 AM   Specimen: BLOOD  Result Value Ref Range Status   Specimen Description BLOOD SITE NOT SPECIFIED  Final   Special Requests   Final    BOTTLES DRAWN AEROBIC AND ANAEROBIC Blood Culture adequate volume   Culture  Setup Time   Final    GRAM POSITIVE COCCI IN BOTH AEROBIC AND ANAEROBIC BOTTLES CRITICAL RESULT CALLED TO, READ BACK BY AND VERIFIED WITH: Melven Sartorius [facility] 09/02/20 0132 JDW Performed at [facility], [address], [place], [place] [address]    Culture STAPHYLOCOCCUS HOMINIS (A)   Final   Report Status 09/04/2020 FINAL  Final   Organism ID, Bacteria STAPHYLOCOCCUS HOMINIS  Final      Susceptibility   Staphylococcus hominis - MIC*    CIPROFLOXACIN <=0.5 SENSITIVE Sensitive     ERYTHROMYCIN >=8 RESISTANT Resistant     GENTAMICIN <=0.5 SENSITIVE Sensitive     OXACILLIN <=0.25 SENSITIVE Sensitive     TETRACYCLINE <=1 SENSITIVE Sensitive     VANCOMYCIN <=0.5 SENSITIVE Sensitive     TRIMETH/SULFA <=10 SENSITIVE Sensitive     CLINDAMYCIN <=  0.25 SENSITIVE Sensitive     RIFAMPIN <=0.5 SENSITIVE Sensitive     Inducible Clindamycin NEGATIVE Sensitive     * STAPHYLOCOCCUS HOMINIS  Blood Culture ID Panel (Reflexed)     Status: Abnormal   Collection Time: 09/01/20  7:30 AM  Result Value Ref Range Status   Enterococcus faecalis NOT DETECTED NOT DETECTED Final   Enterococcus Faecium NOT DETECTED NOT DETECTED Final   Listeria monocytogenes NOT DETECTED NOT DETECTED Final   Staphylococcus species DETECTED (A) NOT DETECTED Final    Comment: CRITICAL RESULT CALLED TO, READ BACK BY AND VERIFIED WITH: J LEDFORD PHARMD 09/02/20 0132 JDW    Staphylococcus aureus (BCID) NOT DETECTED NOT DETECTED Final   Staphylococcus epidermidis DETECTED (A) NOT DETECTED Final    Comment: Methicillin (oxacillin) resistant coagulase negative staphylococcus. Possible blood culture contaminant (unless isolated from more than one blood culture draw or clinical case suggests pathogenicity). No antibiotic treatment is indicated for blood  culture contaminants. CRITICAL RESULT CALLED TO, READ BACK BY AND VERIFIED WITH: J LEDFORD PHARMD 09/02/20 0132 JDW    Staphylococcus lugdunensis NOT DETECTED NOT DETECTED Final   Streptococcus species NOT DETECTED NOT DETECTED Final   Streptococcus agalactiae NOT DETECTED NOT DETECTED Final   Streptococcus pneumoniae NOT DETECTED NOT DETECTED Final   Streptococcus pyogenes NOT DETECTED NOT DETECTED Final   A.calcoaceticus-baumannii NOT DETECTED NOT DETECTED Final    Bacteroides fragilis NOT DETECTED NOT DETECTED Final   Enterobacterales NOT DETECTED NOT DETECTED Final   Enterobacter cloacae complex NOT DETECTED NOT DETECTED Final   Escherichia coli NOT DETECTED NOT DETECTED Final   Klebsiella aerogenes NOT DETECTED NOT DETECTED Final   Klebsiella oxytoca NOT DETECTED NOT DETECTED Final   Klebsiella pneumoniae NOT DETECTED NOT DETECTED Final   Proteus species NOT DETECTED NOT DETECTED Final   Salmonella species NOT DETECTED NOT DETECTED Final   Serratia marcescens NOT DETECTED NOT DETECTED Final   Haemophilus influenzae NOT DETECTED NOT DETECTED Final   Neisseria meningitidis NOT DETECTED NOT DETECTED Final   Pseudomonas aeruginosa NOT DETECTED NOT DETECTED Final   Stenotrophomonas maltophilia NOT DETECTED NOT DETECTED Final   Candida albicans NOT DETECTED NOT DETECTED Final   Candida auris NOT DETECTED NOT DETECTED Final   Candida glabrata NOT DETECTED NOT DETECTED Final   Candida krusei NOT DETECTED NOT DETECTED Final   Candida parapsilosis NOT DETECTED NOT DETECTED Final   Candida tropicalis NOT DETECTED NOT DETECTED Final   Cryptococcus neoformans/gattii NOT DETECTED NOT DETECTED Final   Methicillin resistance mecA/C DETECTED (A) NOT DETECTED Final    Comment: CRITICAL RESULT CALLED TO, READ BACK BY AND VERIFIED WITHMelven Sartorius [facility] 09/02/20 0132 JDW Performed at [facility], [address] [place], [place] [address]   Culture, blood (routine x 2)     Status: None (Preliminary result)   Collection Time: 09/04/20  5:09 AM   Specimen: BLOOD  Result Value Ref Range Status   Specimen Description BLOOD LEFT ANTECUBITAL  Final   Special Requests   Final    BOTTLES DRAWN AEROBIC AND ANAEROBIC Blood Culture adequate volume   Culture   Final    NO GROWTH 3 DAYS Performed at [facility], [address] [place], [place] [address]    Report Status PENDING  Incomplete  Culture, blood (routine x 2)     Status: None (Preliminary  result)   Collection Time: 09/04/20  5:13 AM   Specimen: BLOOD  Result Value Ref Range Status   Specimen  Description BLOOD RIGHT ANTECUBITAL  Final   Special Requests   Final    BOTTLES DRAWN AEROBIC AND ANAEROBIC Blood Culture adequate volume   Culture   Final    NO GROWTH 3 DAYS Performed at [facility], [address], [place], [place] [address]    Report Status PENDING  Incomplete    FURTHER DISCHARGE INSTRUCTIONS:  Get Medicines reviewed and adjusted: Please take all your medications with you for your next visit with your Primary MD  Laboratory/radiological data: Please request your Primary MD to go over all hospital tests and procedure/radiological results at the follow up, please ask your Primary MD to get all Hospital records sent to his/her office.  In some cases, they will be blood work, cultures and biopsy results pending at the time of your discharge. Please request that your primary care M.D. goes through all the records of your hospital data and follows up on these results.  Also Note the following: If you experience worsening of your admission symptoms, develop shortness of breath, life threatening emergency, suicidal or homicidal thoughts you must seek medical attention immediately by calling 911 or calling your MD immediately  if symptoms less severe.  You must read complete instructions/literature along with all the possible adverse reactions/side effects for all the Medicines you take and that have been prescribed to you. Take any new Medicines after you have completely understood and accpet all the possible adverse reactions/side effects.   Do not drive when taking Pain medications or sleeping medications (Benzodaizepines)  Do not take more than prescribed Pain, Sleep and Anxiety Medications. It is not advisable to combine anxiety,sleep and pain medications without talking with your primary care practitioner  Special Instructions: If you have smoked or  chewed Tobacco  in the last 2 yrs please stop smoking, stop any regular Alcohol  and or any Recreational drug use.  Wear Seat belts while driving.  Please note: You were cared for by a hospitalist during your hospital stay. Once you are discharged, your primary care physician will handle any further medical issues. Please note that NO REFILLS for any discharge medications will be authorized once you are discharged, as it is imperative that you return to your primary care physician (or establish a relationship with a primary care physician if you do not have one) for your post hospital discharge needs so that they can reassess your need for medications and monitor your lab values.  Total Time spent coordinating discharge including counseling, education and face to face time equals 35 minutes.  SignedJeoffrey Massed 09/07/2020 11:09 AM

## 2020-09-08 ENCOUNTER — Telehealth (HOSPITAL_COMMUNITY): Payer: Self-pay | Admitting: *Deleted

## 2020-09-08 ENCOUNTER — Ambulatory Visit (HOSPITAL_COMMUNITY): Payer: Medicare Other

## 2020-09-08 NOTE — Telephone Encounter (Signed)
Called him to follow up with his missed appt today. Last appt wife had asked about him being referred to an ACT so unsure if that happened. Left a message to call if needed to reschedule todays missed appt.

## 2020-09-09 LAB — CULTURE, BLOOD (ROUTINE X 2)
Culture: NO GROWTH
Culture: NO GROWTH
Special Requests: ADEQUATE
Special Requests: ADEQUATE

## 2020-09-26 ENCOUNTER — Ambulatory Visit: Payer: Medicare Other | Admitting: Internal Medicine

## 2020-09-26 NOTE — Progress Notes (Deleted)
Cardiology Office Note:    Date:  09/26/2020   ID:  Bryan Davies, DOB 12-Dec-1950, MRN [id number]  PCP:  Bryan Davies, [facility]  [facility] Cardiologist:  Christell Constant, MD  [facility] Electrophysiologist:  None   Referring MD: Pa, [facility]   No chief complaint on file. ***  History of Present Illness:    Bryan Davies is a 69 y.o. male with a hx of HTN, HLD, Prior CVA 12/2018, Carotid Artery Disease s/p CAS, Tobacco Abuse.  Found to have novel troponin elevation 09/01/20 in the setting of stroke and rhabdomyolysis.  No regional WMAs at that time.  Patient notes ***  Ambulatory BP ***     Past Medical History:  Diagnosis Date  . Anxiety   . Hypertension   . Shingles 2013  . Shoulder pain     Past Surgical History:  Procedure Laterality Date  . IR ANGIO VERTEBRAL SEL SUBCLAVIAN INNOMINATE UNI R MOD SED  01/16/2019  . IR CT HEAD LTD  01/16/2019  . IR INTRAVSC STENT CERV CAROTID W/O EMB-PROT MOD SED INC ANGIO  01/16/2019  . IR PERCUTANEOUS ART THROMBECTOMY/INFUSION INTRACRANIAL INC DIAG ANGIO  01/16/2019  . RADIOLOGY WITH ANESTHESIA N/A 01/15/2019   Procedure: IR WITH ANESTHESIA;  Surgeon: Julieanne Cotton, MD;  Location: [facility] OR;  Service: Radiology;  Laterality: N/A;    Current Medications: No outpatient medications have been marked as taking for the 09/26/20 encounter (Appointment) with Christell Constant, MD.   Current Facility-Administered Medications for the 09/26/20 encounter (Appointment) with Christell Constant, MD  Medication  . haloperidol decanoate (HALDOL DECANOATE) 100 MG/ML injection 100 mg     Allergies:   Pollen extract   Social History   Socioeconomic History  . Marital status: Single    Spouse name: Not on file  . Number of children: Not on file  . Years of education: Not on file  . Highest education level: Not on file  Occupational History  . Occupation: Carpenter  Tobacco Use  . Smoking status: Current Every Day  Smoker    Packs/day: 2.00    Years: 50.00    Pack years: 100.00    Types: Cigarettes  . Smokeless tobacco: Never Used  . Tobacco comment: 10  cigarettes per day, waits an hour between  Vaping Use  . Vaping Use: Never used  Substance and Sexual Activity  . Alcohol use: No  . Drug use: No  . Sexual activity: Not on file  Other Topics Concern  . Not on file  Social History Narrative  . Not on file   Social Determinants of Health   Financial Resource Strain:   . Difficulty of Paying Living Expenses: Not on file  Food Insecurity:   . Worried About Programme researcher, broadcasting/film/video in the Last Year: Not on file  . Ran Out of Food in the Last Year: Not on file  Transportation Needs:   . Lack of Transportation (Medical): Not on file  . Lack of Transportation (Non-Medical): Not on file  Physical Activity:   . Days of Exercise per Week: Not on file  . Minutes of Exercise per Session: Not on file  Stress:   . Feeling of Stress : Not on file  Social Connections:   . Frequency of Communication with Friends and Family: Not on file  . Frequency of Social Gatherings with Friends and Family: Not on file  . Attends Religious Services: Not on file  . Active Member of Clubs  or Organizations: Not on file  . Attends Banker Meetings: Not on file  . Marital Status: Not on file     Family History: The patient's ***Family history is unknown by patient.  ROS:   Please see the history of present illness.    *** All other systems reviewed and are negative.  EKGs/Labs/Other Studies Reviewed:    The following studies were reviewed today: ***  EKG:  EKG is *** ordered today.  The ekg ordered today demonstrates ***  Recent Labs: 09/07/2020: ALT 113; BUN 24; Creatinine, Ser 0.90; Hemoglobin 12.6; Platelets 184; Potassium 4.4; Sodium 140  Recent Lipid Panel    Component Value Date/Time   CHOL 143 09/01/2020 0257   TRIG 102 09/01/2020 0257   HDL 46 09/01/2020 0257   CHOLHDL 3.1 09/01/2020  0257   VLDL 20 09/01/2020 0257   LDLCALC 77 09/01/2020 0257     Risk Assessment/Calculations:   {Does this patient have ATRIAL FIBRILLATION?:[id number]}   Physical Exam:    VS:  There were no vitals taken for this visit.    Wt Readings from Last 3 Encounters:  09/04/20 160 lb 11.5 oz (72.9 kg)  08/10/20 148 lb (67.1 kg)  05/18/20 149 lb 14.6 oz (68 kg)     GEN: *** Well nourished, well developed in no acute distress HEENT: Normal NECK: No JVD; No carotid bruits LYMPHATICS: No lymphadenopathy CARDIAC: ***RRR, no murmurs, rubs, gallops RESPIRATORY:  Clear to auscultation without rales, wheezing or rhonchi  ABDOMEN: Soft, non-tender, non-distended MUSCULOSKELETAL:  No edema; No deformity  SKIN: Warm and dry NEUROLOGIC:  Alert and oriented x 3 PSYCHIATRIC:  Normal affect   ASSESSMENT:    No diagnosis found. PLAN:    In order of problems listed above:  Hx NSTEMI (concern for demand in the setting of rhabdomyolysis) HTN HLD CVA Carotid Artery Disease - Will obtain lipid profile - ASA 81 mg daily - would recommend a lexiscan nuclear medicine stress test (NPO at midnight/hold beta blocker in AM); discussed risks, benefits, and alternatives of the diagnostic procedure including chest pain, arrhythmia, and death.  Patient amenable for testing. - if positive, discussed risks and benefits of cardiac catheterization have been discussed with the patient.  These include bleeding, infection, kidney damage, stroke, heart attack, death.  The patient understands these risks and is willing to proceed if necessary - will check CPK and LFTs - if resolved would restart statin and follow up labs  Tobacco Abuse - discussed the dangers of tobacco use, both inhaled and oral, which include, but are not limited to cardiovascular disease, increased cancer risk of multiple types of cancer, COPD, peripheral arterial disease, strokes. - counseled on the benefits of smoking cessation. - firmly  advised to quit.  - we also reviewed strategies to maximize success, including:  Removing cigarettes and smoking materials from environment   Stress management  Substitution of other forms of reinforcement (the one cigarette a day approach)  Support of family/friends and group smoking cessation  Selecting a quit date.  Patient provided contact information for [employer] or 1-800-QUIT-NOW  Patient provided with [employer] free smoking cessation classes: (336) (365)225-6364 and [place]    *** follow up unless new symptoms or abnormal test results warranting change in plan  Would be reasonable for *** Virtual Follow up  Would be reasonable for *** APP Follow up    Shared Decision Making/Informed Consent   {Are you ordering a CV Procedure (e.g. stress test, cath, DCCV, TEE, etc)?  Press F2        :[id number]}    Medication Adjustments/Labs and Tests Ordered: Current medicines are reviewed at length with the patient today.  Concerns regarding medicines are outlined above.  No orders of the defined types were placed in this encounter.  No orders of the defined types were placed in this encounter.   There are no Patient Instructions on file for this visit.   Signed, Christell Constant, MD  09/26/2020 9:13 AM    [facility]

## 2020-10-10 ENCOUNTER — Inpatient Hospital Stay: Payer: Medicare Other | Admitting: Adult Health

## 2020-10-10 ENCOUNTER — Telehealth: Payer: Self-pay | Admitting: Adult Health

## 2020-10-19 ENCOUNTER — Other Ambulatory Visit (HOSPITAL_COMMUNITY): Payer: Self-pay | Admitting: Psychiatry

## 2020-10-19 DIAGNOSIS — F3162 Bipolar disorder, current episode mixed, moderate: Secondary | ICD-10-CM

## 2020-11-13 ENCOUNTER — Emergency Department (HOSPITAL_COMMUNITY): Payer: Medicare Other

## 2020-11-13 ENCOUNTER — Inpatient Hospital Stay (HOSPITAL_COMMUNITY)
Admission: EM | Admit: 2020-11-13 | Discharge: 2020-11-29 | DRG: 871 | Disposition: E | Payer: Medicare Other | Source: Skilled Nursing Facility | Attending: Internal Medicine | Admitting: Internal Medicine

## 2020-11-13 DIAGNOSIS — I2510 Atherosclerotic heart disease of native coronary artery without angina pectoris: Secondary | ICD-10-CM | POA: Diagnosis present

## 2020-11-13 DIAGNOSIS — L89029 Pressure ulcer of left elbow, unspecified stage: Secondary | ICD-10-CM | POA: Diagnosis present

## 2020-11-13 DIAGNOSIS — G9341 Metabolic encephalopathy: Secondary | ICD-10-CM | POA: Diagnosis present

## 2020-11-13 DIAGNOSIS — Z8673 Personal history of transient ischemic attack (TIA), and cerebral infarction without residual deficits: Secondary | ICD-10-CM

## 2020-11-13 DIAGNOSIS — G9340 Encephalopathy, unspecified: Secondary | ICD-10-CM

## 2020-11-13 DIAGNOSIS — J440 Chronic obstructive pulmonary disease with acute lower respiratory infection: Secondary | ICD-10-CM | POA: Diagnosis present

## 2020-11-13 DIAGNOSIS — E785 Hyperlipidemia, unspecified: Secondary | ICD-10-CM | POA: Diagnosis present

## 2020-11-13 DIAGNOSIS — E878 Other disorders of electrolyte and fluid balance, not elsewhere classified: Secondary | ICD-10-CM | POA: Diagnosis present

## 2020-11-13 DIAGNOSIS — Z4659 Encounter for fitting and adjustment of other gastrointestinal appliance and device: Secondary | ICD-10-CM

## 2020-11-13 DIAGNOSIS — J690 Pneumonitis due to inhalation of food and vomit: Secondary | ICD-10-CM | POA: Diagnosis present

## 2020-11-13 DIAGNOSIS — A419 Sepsis, unspecified organism: Secondary | ICD-10-CM

## 2020-11-13 DIAGNOSIS — R64 Cachexia: Secondary | ICD-10-CM | POA: Diagnosis present

## 2020-11-13 DIAGNOSIS — G2401 Drug induced subacute dyskinesia: Secondary | ICD-10-CM | POA: Diagnosis present

## 2020-11-13 DIAGNOSIS — E870 Hyperosmolality and hypernatremia: Secondary | ICD-10-CM | POA: Diagnosis not present

## 2020-11-13 DIAGNOSIS — Z7982 Long term (current) use of aspirin: Secondary | ICD-10-CM

## 2020-11-13 DIAGNOSIS — A4189 Other specified sepsis: Secondary | ICD-10-CM | POA: Diagnosis not present

## 2020-11-13 DIAGNOSIS — F319 Bipolar disorder, unspecified: Secondary | ICD-10-CM | POA: Diagnosis present

## 2020-11-13 DIAGNOSIS — Z66 Do not resuscitate: Secondary | ICD-10-CM | POA: Diagnosis not present

## 2020-11-13 DIAGNOSIS — Z515 Encounter for palliative care: Secondary | ICD-10-CM

## 2020-11-13 DIAGNOSIS — I10 Essential (primary) hypertension: Secondary | ICD-10-CM | POA: Diagnosis present

## 2020-11-13 DIAGNOSIS — E7220 Disorder of urea cycle metabolism, unspecified: Secondary | ICD-10-CM | POA: Diagnosis present

## 2020-11-13 DIAGNOSIS — E860 Dehydration: Secondary | ICD-10-CM | POA: Diagnosis present

## 2020-11-13 DIAGNOSIS — R0602 Shortness of breath: Secondary | ICD-10-CM

## 2020-11-13 DIAGNOSIS — Z6822 Body mass index (BMI) 22.0-22.9, adult: Secondary | ICD-10-CM

## 2020-11-13 DIAGNOSIS — J1282 Pneumonia due to coronavirus disease 2019: Secondary | ICD-10-CM | POA: Diagnosis present

## 2020-11-13 DIAGNOSIS — F17210 Nicotine dependence, cigarettes, uncomplicated: Secondary | ICD-10-CM | POA: Diagnosis present

## 2020-11-13 DIAGNOSIS — R739 Hyperglycemia, unspecified: Secondary | ICD-10-CM | POA: Diagnosis present

## 2020-11-13 DIAGNOSIS — E861 Hypovolemia: Secondary | ICD-10-CM | POA: Diagnosis present

## 2020-11-13 DIAGNOSIS — R6521 Severe sepsis with septic shock: Secondary | ICD-10-CM | POA: Diagnosis present

## 2020-11-13 DIAGNOSIS — J80 Acute respiratory distress syndrome: Secondary | ICD-10-CM | POA: Diagnosis present

## 2020-11-13 DIAGNOSIS — L8990 Pressure ulcer of unspecified site, unspecified stage: Secondary | ICD-10-CM | POA: Insufficient documentation

## 2020-11-13 DIAGNOSIS — U071 COVID-19: Secondary | ICD-10-CM | POA: Diagnosis present

## 2020-11-13 DIAGNOSIS — Z452 Encounter for adjustment and management of vascular access device: Secondary | ICD-10-CM

## 2020-11-13 DIAGNOSIS — I252 Old myocardial infarction: Secondary | ICD-10-CM

## 2020-11-13 DIAGNOSIS — Z7401 Bed confinement status: Secondary | ICD-10-CM

## 2020-11-13 DIAGNOSIS — R339 Retention of urine, unspecified: Secondary | ICD-10-CM | POA: Diagnosis present

## 2020-11-13 DIAGNOSIS — N179 Acute kidney failure, unspecified: Secondary | ICD-10-CM | POA: Diagnosis present

## 2020-11-13 DIAGNOSIS — Z79899 Other long term (current) drug therapy: Secondary | ICD-10-CM

## 2020-11-13 MED ORDER — ETOMIDATE 2 MG/ML IV SOLN
INTRAVENOUS | Status: AC | PRN
  Administered 2020-11-13: 20 mg via INTRAVENOUS

## 2020-11-13 MED ORDER — SODIUM CHLORIDE 0.9 % IV BOLUS (SEPSIS)
1000.0000 mL | Freq: Once | INTRAVENOUS | Status: AC
  Administered 2020-11-14: 1000 mL via INTRAVENOUS

## 2020-11-13 MED ORDER — ACETAMINOPHEN 650 MG RE SUPP
650.0000 mg | Freq: Once | RECTAL | Status: AC
  Administered 2020-11-14: 650 mg via RECTAL
  Filled 2020-11-13: qty 1

## 2020-11-13 MED ORDER — FENTANYL CITRATE (PF) 100 MCG/2ML IJ SOLN
50.0000 ug | INTRAMUSCULAR | Status: DC | PRN
  Filled 2020-11-13: qty 2

## 2020-11-13 MED ORDER — ETOMIDATE 2 MG/ML IV SOLN
INTRAVENOUS | Status: AC
  Filled 2020-11-13: qty 20

## 2020-11-13 MED ORDER — SODIUM CHLORIDE 0.9 % IV BOLUS (SEPSIS)
250.0000 mL | Freq: Once | INTRAVENOUS | Status: AC
  Administered 2020-11-14: 250 mL via INTRAVENOUS

## 2020-11-13 MED ORDER — PROPOFOL 1000 MG/100ML IV EMUL
0.0000 ug/kg/min | INTRAVENOUS | Status: DC
  Filled 2020-11-13: qty 100

## 2020-11-13 MED ORDER — SODIUM CHLORIDE 0.9 % IV SOLN: INTRAVENOUS | Status: DC

## 2020-11-13 MED ORDER — VANCOMYCIN HCL IN DEXTROSE 1-5 GM/200ML-% IV SOLN: 1000.0000 mg | Freq: Once | INTRAVENOUS | Status: DC

## 2020-11-13 MED ORDER — METRONIDAZOLE IN NACL 5-0.79 MG/ML-% IV SOLN
500.0000 mg | Freq: Once | INTRAVENOUS | Status: AC
  Administered 2020-11-14: 500 mg via INTRAVENOUS
  Filled 2020-11-13: qty 100

## 2020-11-13 MED ORDER — ROCURONIUM BROMIDE 50 MG/5ML IV SOLN
INTRAVENOUS | Status: AC | PRN
  Administered 2020-11-13: 75 mg via INTRAVENOUS

## 2020-11-13 MED ORDER — SODIUM CHLORIDE 0.9 % IV SOLN
2.0000 g | Freq: Once | INTRAVENOUS | Status: AC
  Administered 2020-11-14: 2 g via INTRAVENOUS
  Filled 2020-11-13: qty 2

## 2020-11-13 MED ORDER — ROCURONIUM BROMIDE 10 MG/ML (PF) SYRINGE
PREFILLED_SYRINGE | INTRAVENOUS | Status: AC
  Filled 2020-11-13: qty 10

## 2020-11-13 MED ORDER — FENTANYL CITRATE (PF) 100 MCG/2ML IJ SOLN: 50.0000 ug | INTRAMUSCULAR | Status: DC | PRN

## 2020-11-13 MED ORDER — SUCCINYLCHOLINE CHLORIDE 200 MG/10ML IV SOSY
PREFILLED_SYRINGE | INTRAVENOUS | Status: AC
  Filled 2020-11-13: qty 10

## 2020-11-13 MED ORDER — VANCOMYCIN HCL 1500 MG/300ML IV SOLN
1500.0000 mg | Freq: Once | INTRAVENOUS | Status: AC
  Administered 2020-11-14: 1500 mg via INTRAVENOUS
  Filled 2020-11-13: qty 300

## 2020-11-13 NOTE — ED Triage Notes (Signed)
Pt bib ems from [facility] after being called out for "breathing problems" and AMS x15 minutes. Per facility, pt is recovering from pneumonia - finished meds 2 days ago. Pt arrives on NRB with o2 in th 70's  EMS vitals: 136 HR Temp: 105 tympanic  106/68

## 2020-11-13 NOTE — ED Provider Notes (Signed)
[facility] Provider Note  CSN: [id number] Arrival date & time: 12-14-20 2321  Chief Complaint(s) No chief complaint on file. ED Triage Notes Shea Stakesate, Grace N, RN (Registered Nurse) . Marland Kitchen. Emergency Medicine . Marland Kitchen. Date of Service: 04-11-2021 11:22 PM . . Signed   [facility] being called [facility] for "breathing problems" and AMS x15 minutes. Per facility, pt is recovering from pneumonia - finished meds 2 days ago. Pt arrives on NRB with o2 in th 70's  EMS vitals: 136 HR Temp: 105 tympanic  106/68      HPI Annye EnglishRoger D Blackard is a 70 y.o. male here for AMS from sNF. Recently completed Abx for pneumonia. H/o bipolar on depakote and haldol.  HPI  Past Medical History Past Medical History:  Diagnosis Date  . Anxiety   . Hypertension   . Shingles 2013  . Shoulder pain    Patient Active Problem List   Diagnosis Date Noted  . ARF (acute renal failure) (HCC) 09/01/2020  . Elevated troponin 09/01/2020  . Elevated LFTs 09/01/2020  . Non-ST elevation (NSTEMI) myocardial infarction (HCC)   . Non-traumatic rhabdomyolysis   . Left-sided weakness 08/31/2020  . Bipolar 1 disorder, mixed, moderate (HCC) 06/08/2020  . Tardive dyskinesia 06/08/2020  . Bipolar 1 disorder (HCC) 05/27/2019  . 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 Ischemic stroke (HCC) 01/15/2019  . Left shoulder pain 12/05/2015  . Urinary hesitancy 12/05/2015  . Pain in lower jaw 07/11/2015  . Hyperlipidemia 02/11/2014  . 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 Dyshydrosis 07/26/2013  . 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 Hypertension 05/21/2012  . Post herpetic neuralgia 05/21/2012  . Bipolar disorder (HCC) 05/21/2012  . Tobacco abuse 05/21/2012   Home Medication(s) Prior to Admission medications   Medication Sig Start Date End Date Taking? Authorizing Provider  aspirin 81 MG chewable tablet Chew 4 tablets (324 mg total) by mouth daily.  09/08/20   Ghimire, Werner LeanShanker M, MD  atorvastatin (LIPITOR) 40 MG tablet Take 1 tablet (40 mg total) by mouth daily. 01/18/19   Rinehuls, Kinnie Scalesavid L, PA-C  Deutetrabenazine (AUSTEDO) 12 MG TABS Take 18 mg by mouth in the morning and at bedtime. 09/07/20   Ghimire, Werner LeanShanker M, MD  divalproex (DEPAKOTE ER) 250 MG 24 hr tablet TAKE THREE TABLETS (750 MG TOTAL) BY MOUTH AT BEDTIME. 10/25/20   Toy CookeyParsons, Brittney E, NP  gabapentin (NEURONTIN) 300 MG capsule Take 1 capsule (300 mg total) by mouth 2 (two) times daily. 06/08/20   Shanna CiscoParsons, Brittney E, NP  losartan (COZAAR) 50 MG tablet Take 1 tablet (50 mg total) by mouth daily. 06/01/19   Malvin JohnsFarah, Brian, MD  tamsulosin (FLOMAX) 0.4 MG CAPS capsule Take 2 capsules (0.8 mg total) by mouth daily. 09/07/20   Ghimire, Werner LeanShanker M, MD  Past Surgical History Past Surgical History:  Procedure Laterality Date  . IR ANGIO VERTEBRAL SEL SUBCLAVIAN INNOMINATE UNI R MOD SED  01/16/2019  . IR CT HEAD LTD  01/16/2019  . IR INTRAVSC STENT CERV CAROTID W/O EMB-PROT MOD SED INC ANGIO  01/16/2019  . IR PERCUTANEOUS ART THROMBECTOMY/INFUSION INTRACRANIAL INC DIAG ANGIO  01/16/2019  . RADIOLOGY WITH ANESTHESIA N/A 01/15/2019   Procedure: IR WITH ANESTHESIA;  Surgeon: Julieanne Cotton, MD;  Location: [facility] OR;  Service: Radiology;  Laterality: N/A;   Family History Family History  Family history unknown: Yes    Social History Social History   Tobacco Use  . Smoking status: Current Every Day Smoker    Packs/day: 2.00    Years: 50.00    Pack years: 100.00    Types: Cigarettes  . Smokeless tobacco: Never Used  . Tobacco comment: 10  cigarettes per day, waits an hour between  Vaping Use  . Vaping Use: Never used  Substance Use Topics  . Alcohol use: No  . Drug use: No   Allergies Pollen extract  Review of Systems Review of Systems  Unable to perform  ROS: Mental status change    Physical Exam Vital Signs  I have reviewed the triage vital signs BP (!) 90/51   Pulse 90   Temp (!) 100.4 F (38 C)   Resp 18   Ht 5\' 6"  (1.676 m)   Wt 72.9 kg   SpO2 100%   BMI 25.94 kg/m   Physical Exam Vitals reviewed.  Constitutional:      General: He is not in acute distress.    Appearance: He is well-developed and well-nourished. He is not diaphoretic.  HENT:     Head: Normocephalic and atraumatic.     Nose: Nose normal.     Mouth/Throat:     Comments: Thick oral secreations Eyes:     General: No scleral icterus.       Right eye: No discharge.        Left eye: No discharge.     Extraocular Movements: EOM normal.     Conjunctiva/sclera: Conjunctivae normal.     Pupils: Pupils are equal, round, and reactive to light.  Cardiovascular:     Rate and Rhythm: Normal rate and regular rhythm.     Heart sounds: No murmur heard. No friction rub. No gallop.   Pulmonary:     Effort: Pulmonary effort is normal. No respiratory distress.     Breath sounds: Normal breath sounds. No stridor. No rales.  Abdominal:     General: There is no distension.     Palpations: Abdomen is soft.     Tenderness: There is no abdominal tenderness.  Musculoskeletal:        General: No tenderness or edema.     Cervical back: Normal range of motion and neck supple.  Skin:    General: Skin is warm and dry.     Findings: No erythema.       Neurological:     Mental Status: He is unresponsive.     Motor: Tremor and abnormal muscle tone (increased) present.     Comments: Eyes open. +corneal reflex. No response to confrontation or painful stimuli. hypereflexsive with clonus  Psychiatric:        Mood and Affect: Mood and affect normal.     ED Results and Treatments Labs (all labs ordered are listed, but only abnormal results are displayed) Labs Reviewed  RESP PANEL BY RT-PCR (FLU A&B, COVID)  ARPGX2 - Abnormal; Notable for the following components:       Result Value   SARS Coronavirus 2 by RT PCR POSITIVE (*)    All other components within normal limits  LACTIC ACID, PLASMA - Abnormal; Notable for the following components:   Lactic Acid, Venous 4.0 (*)    All other components within normal limits  URINALYSIS, ROUTINE W REFLEX MICROSCOPIC - Abnormal; Notable for the following components:   Protein, ur 30 (*)    All other components within normal limits  I-STAT CHEM 8, ED - Abnormal; Notable for the following components:   Sodium 164 (*)    Potassium 3.1 (*)    Chloride 130 (*)    BUN 74 (*)    Creatinine, Ser 2.40 (*)    Glucose, Bld 156 (*)    Calcium, Ion 1.04 (*)    TCO2 20 (*)    Hemoglobin 10.2 (*)    HCT 30.0 (*)    All other components within normal limits  I-STAT VENOUS BLOOD GAS, ED - Abnormal; Notable for the following components:   pCO2, Ven 37.5 (*)    pO2, Ven 134.0 (*)    TCO2 21 (*)    Acid-base deficit 5.0 (*)    Sodium 164 (*)    Potassium 3.1 (*)    Calcium, Ion 1.05 (*)    HCT 29.0 (*)    Hemoglobin 9.9 (*)    All other components within normal limits  CULTURE, BLOOD (ROUTINE X 2)  CULTURE, BLOOD (ROUTINE X 2)  URINE CULTURE  CSF CULTURE  LACTIC ACID, PLASMA  COMPREHENSIVE METABOLIC PANEL  CBC WITH DIFFERENTIAL/PLATELET  PROTIME-INR  APTT  TRIGLYCERIDES  CK  CSF CELL COUNT WITH DIFFERENTIAL  PROTEIN AND GLUCOSE, CSF  VARICELLA-ZOSTER BY PCR  HERPES SIMPLEX VIRUS(HSV) DNA BY PCR  BLOOD GAS, ARTERIAL                                                                                                                         EKG  EKG Interpretation  Date/Time:  Monday November 14 2020 00:02:24 EST Ventricular Rate:  120 PR Interval:    QRS Duration: 113 QT Interval:  332 QTC Calculation: 470 R Axis:   -94 Text Interpretation: Sinus tachycardia Incomplete RBBB and LAFB Abnormal R-wave progression, late transition Confirmed by Drema Pry [phone]) on 11/14/2020 12:08:16 AM      Radiology DG  Chest Port 1 View  Result Date: 11/14/2020 CLINICAL DATA:  Questionable sepsis, OG tube placement EXAM: PORTABLE CHEST 1 VIEW COMPARISON:  08/31/2020 FINDINGS: *Endotracheal tube tip terminates low in the trachea, 2.3 cm from the carina. Recommend retraction 2 cm to the mid trachea. *Transesophageal tube tip and side port are distal to the GE junction, terminating in the gastric body. *Telemetry leads and external support devices overlie the chest. Diffuse airways thickening and coarsened interstitial changes. More patchy right infrahilar opacity could reflect atelectasis and vascular crowding though early consolidation could have a similar  appearance. No convincing features of edema. No pneumothorax or visible effusion. The aorta is calcified. The remaining cardiomediastinal contours are unremarkable. No acute osseous or soft tissue abnormality. Degenerative changes are present in the imaged spine and shoulders. IMPRESSION: 1. Endotracheal tube tip terminates low in the trachea, 2.3 cm from the carina. Recommend retraction 2 cm to the mid trachea. 2. Transesophageal tube tip and side port are distal to the GE junction, terminating in the gastric body. 3. Some diffuse airways thickening and coarsened interstitial changes could reflect a bronchitis/bronchiolitis in the appropriate clinical setting with more patchy opacity in the right infrahilar lung possibly atelectatic or early consolidation Electronically Signed   By: Kreg Shropshire M.D.   On: 11/14/2020 00:13    Pertinent labs & imaging results that were available during my care of the patient were reviewed by me and considered in my medical decision making (see chart for details).  Medications Ordered in ED Medications  rocuronium bromide 100 MG/10ML SOSY (has no administration in time range)  etomidate (AMIDATE) 2 MG/ML injection (has no administration in time range)  succinylcholine (ANECTINE) 200 MG/10ML syringe (has no administration in time range)   0.9 %  sodium chloride infusion (has no administration in time range)  metroNIDAZOLE (FLAGYL) IVPB 500 mg (500 mg Intravenous New Bag/Given 11/14/20 0109)  fentaNYL (SUBLIMAZE) injection 50 mcg (has no administration in time range)  fentaNYL (SUBLIMAZE) injection 50 mcg (has no administration in time range)  propofol (DIPRIVAN) 1000 MG/100ML infusion (has no administration in time range)  vancomycin (VANCOREADY) IVPB 1500 mg/300 mL (1,500 mg Intravenous New Bag/Given 11/14/20 0157)  norepinephrine (LEVOPHED) 4mg  in premix infusion (10 mcg/min Intravenous Rate/Dose Change 11/14/20 0103)  rocuronium (ZEMURON) injection (75 mg Intravenous Given 12/06/2020 2338)  etomidate (AMIDATE) injection (20 mg Intravenous Given Dec 06, 2020 2336)  sodium chloride 0.9 % bolus 1,000 mL (0 mLs Intravenous Stopped 11/14/20 0120)    And  sodium chloride 0.9 % bolus 1,000 mL (0 mLs Intravenous Stopped 11/14/20 0154)    And  sodium chloride 0.9 % bolus 250 mL (0 mLs Intravenous Stopped 11/14/20 0154)  ceFEPIme (MAXIPIME) 2 g in sodium chloride 0.9 % 100 mL IVPB (0 g Intravenous Stopped 11/14/20 0154)  acetaminophen (TYLENOL) suppository 650 mg (650 mg Rectal Given 11/14/20 0159)                                                                                                                                    Procedures .1-3 Lead EKG Interpretation Performed by: 11/16/20, MD Authorized by: Nira Conn, MD     Interpretation: normal     ECG rate:  132   ECG rate assessment: tachycardic     Rhythm: sinus tachycardia     Ectopy: none   Procedure Name: Intubation Date/Time: 11/14/2020 2:04 AM Performed by: 11/16/2020, MD Pre-anesthesia Checklist: Patient identified, Patient being monitored, Emergency Drugs available, Timeout performed  and Suction available Oxygen Delivery Method: Non-rebreather mask Preoxygenation: Pre-oxygenation with 100% oxygen Induction Type: Rapid  sequence Ventilation: Mask ventilation without difficulty Laryngoscope Size: Glidescope Grade View: Grade I Tube size: 7.5 mm Number of attempts: 1 Airway Equipment and Method: Rigid stylet Placement Confirmation: ETT inserted through vocal cords under direct vision,  CO2 detector and Breath sounds checked- equal and bilateral Secured at: 27 cm Tube secured with: ETT holder Difficulty Due To: Difficulty was anticipated    .Central Line  Date/Time: 11/14/2020 2:05 AM Performed by: Nira Conn, MD Authorized by: Nira Conn, MD   Consent:    Consent obtained:  Emergent situation Universal protocol:    Patient identity confirmed:  Arm band Pre-procedure details:    Indication(s): central venous access     Hand hygiene: Hand hygiene performed prior to insertion     Sterile barrier technique: All elements of maximal sterile technique followed     Skin preparation:  Chlorhexidine   Skin preparation agent: Skin preparation agent completely dried prior to procedure   Anesthesia:    Anesthesia method:  Local infiltration   Local anesthetic:  Lidocaine 1% w/o epi Procedure details:    Location:  L internal jugular   Patient position:  Trendelenburg   Procedural supplies:  Triple lumen   Catheter size:  7 Fr   Landmarks identified: yes     Ultrasound guidance: yes     Ultrasound guidance timing: prior to insertion and real time     Sterile ultrasound techniques: Sterile gel and sterile probe covers were used     Number of attempts:  1   Successful placement: yes   Post-procedure details:    Post-procedure:  Dressing applied and line sutured   Assessment:  Blood return through all ports, free fluid flow, no pneumothorax on x-ray and placement verified by x-ray   Procedure completion:  Tolerated well, no immediate complications .Critical Care Performed by: Nira Conn, MD Authorized by: Nira Conn, MD   Critical care provider  statement:    Critical care time (minutes):  80   Critical care start time:  Nov 26, 2020 11:57 PM   Critical care end time:  11/14/2020 3:30 AM   Critical care time was exclusive of:  Separately billable procedures and treating other patients   Critical care was necessary to treat or prevent imminent or life-threatening deterioration of the following conditions:  Sepsis, shock, respiratory failure, CNS failure or compromise, circulatory failure and metabolic crisis   Critical care was time spent personally by me on the following activities:  Discussions with consultants, evaluation of patient's response to treatment, examination of patient, ordering and performing treatments and interventions, ordering and review of laboratory studies, ordering and review of radiographic studies, pulse oximetry, re-evaluation of patient's condition, obtaining history from patient or surrogate and review of old charts   Care discussed with: admitting provider   .Lumbar Puncture  Date/Time: 11/14/2020 7:06 AM Performed by: Nira Conn, MD Authorized by: Nira Conn, MD   Consent:    Consent obtained:  Emergent situation Pre-procedure details:    Procedure purpose:  Diagnostic   Preparation: Patient was prepped and draped in usual sterile fashion   Procedure details:    Lumbar space:  L3-L4 interspace   Patient position:  L lateral decubitus   Needle gauge:  18   Needle length (in):  3.5   Number of attempts:  3 Comments:     Since APP was unsuccessful, I attempted to  as well, but was not successful either.    (including critical care time)  Medical Decision Making / ED Course I have reviewed the nursing notes for this encounter and the patient's prior records (if available in EHR or on provided paperwork).   Annye EnglishRoger D Sawhney was evaluated in Emergency Department on 11/14/2020 for the symptoms described in the history of present illness. He was evaluated in the context of the global  COVID-19 pandemic, which necessitated consideration that the patient might be at risk for infection with the SARS-CoV-2 virus that causes COVID-19. Institutional protocols and algorithms that pertain to the evaluation of patients at risk for COVID-19 are in a state of rapid change based on information released by regulatory bodies including the CDC and federal and state organizations. These policies and algorithms were followed during the patient's care in the ED.    Clinical Course as of 11/14/20 [id number]  Wynelle LinkSun Nov 13, 2020  2351 Sepsis vs meningitis vs NMS vs Serotonin syndrome. Intubated for airway protection Code sepsis initiated and started on empiric Abx with 30cc/kg IVF.  Will LP for CSF.  Recent head imaging w/o mass. Adding VZV and HSV CSF due to shingles history  For possible NMS vs serotonin: Aggressive supportive management Will hold dantrolene for the time being. [PC]  Mon Nov 14, 2020  0145 Patient became hypotensive while getting IVF. Additional 1L given to make total of >4L (including EMS IVF). Started on pressors. Central line placed.  [PC]  0147 Initial work up returning; UA negative. CXR with streaky opacity in RLL. Lactic 4 COVID + [PC]  0157 Called down to lab as labs have not started running after 1.5 hrs labs were sent down. They located blood and will run now.  [PC]  0200 Istat labs showing hypernatremia and hyperchloremia with AKI. Will await formal labs in case this was mixed with saline. [PC]  0202 Will call to CC for admission. APP to perform LP. [PC]  0233 Hypernatremia confirmed with formal labs. [PC]  0300 Unsuccessful LP [PC]    Clinical Course User Index [PC] Margalit Leece, Amadeo GarnetPedro Eduardo, MD     Final Clinical Impression(s) / ED Diagnoses Final diagnoses:  Sepsis with encephalopathy and septic shock, due to unspecified organism [facility](HCC)  COVID-19 virus infection  AKI (acute kidney injury) (HCC)  Encounter for central line placement      This chart  was dictated using voice recognition software.  Despite best efforts to proofread,  errors can occur which can change the documentation meaning.   Nira Connardama, Deneen Slager Eduardo, MD 11/14/20 [phone]

## 2020-11-14 ENCOUNTER — Inpatient Hospital Stay (HOSPITAL_COMMUNITY): Payer: Medicare Other

## 2020-11-14 ENCOUNTER — Emergency Department (HOSPITAL_COMMUNITY): Payer: Medicare Other

## 2020-11-14 DIAGNOSIS — J80 Acute respiratory distress syndrome: Secondary | ICD-10-CM

## 2020-11-14 DIAGNOSIS — I351 Nonrheumatic aortic (valve) insufficiency: Secondary | ICD-10-CM

## 2020-11-14 DIAGNOSIS — F17210 Nicotine dependence, cigarettes, uncomplicated: Secondary | ICD-10-CM | POA: Diagnosis present

## 2020-11-14 DIAGNOSIS — J1282 Pneumonia due to coronavirus disease 2019: Secondary | ICD-10-CM | POA: Diagnosis present

## 2020-11-14 DIAGNOSIS — R0600 Dyspnea, unspecified: Secondary | ICD-10-CM | POA: Diagnosis not present

## 2020-11-14 DIAGNOSIS — G9340 Encephalopathy, unspecified: Secondary | ICD-10-CM | POA: Diagnosis not present

## 2020-11-14 DIAGNOSIS — U071 COVID-19: Secondary | ICD-10-CM

## 2020-11-14 DIAGNOSIS — A4189 Other specified sepsis: Secondary | ICD-10-CM | POA: Diagnosis present

## 2020-11-14 DIAGNOSIS — A419 Sepsis, unspecified organism: Secondary | ICD-10-CM | POA: Diagnosis not present

## 2020-11-14 DIAGNOSIS — Z7982 Long term (current) use of aspirin: Secondary | ICD-10-CM | POA: Diagnosis not present

## 2020-11-14 DIAGNOSIS — L8990 Pressure ulcer of unspecified site, unspecified stage: Secondary | ICD-10-CM | POA: Insufficient documentation

## 2020-11-14 DIAGNOSIS — I361 Nonrheumatic tricuspid (valve) insufficiency: Secondary | ICD-10-CM

## 2020-11-14 DIAGNOSIS — Z8673 Personal history of transient ischemic attack (TIA), and cerebral infarction without residual deficits: Secondary | ICD-10-CM | POA: Diagnosis not present

## 2020-11-14 DIAGNOSIS — Z7189 Other specified counseling: Secondary | ICD-10-CM | POA: Diagnosis not present

## 2020-11-14 DIAGNOSIS — Z79899 Other long term (current) drug therapy: Secondary | ICD-10-CM | POA: Diagnosis not present

## 2020-11-14 DIAGNOSIS — E785 Hyperlipidemia, unspecified: Secondary | ICD-10-CM | POA: Diagnosis present

## 2020-11-14 DIAGNOSIS — Z515 Encounter for palliative care: Secondary | ICD-10-CM | POA: Diagnosis not present

## 2020-11-14 DIAGNOSIS — F319 Bipolar disorder, unspecified: Secondary | ICD-10-CM | POA: Diagnosis present

## 2020-11-14 DIAGNOSIS — Z66 Do not resuscitate: Secondary | ICD-10-CM | POA: Diagnosis not present

## 2020-11-14 DIAGNOSIS — E870 Hyperosmolality and hypernatremia: Secondary | ICD-10-CM | POA: Diagnosis present

## 2020-11-14 DIAGNOSIS — E7220 Disorder of urea cycle metabolism, unspecified: Secondary | ICD-10-CM | POA: Diagnosis present

## 2020-11-14 DIAGNOSIS — J440 Chronic obstructive pulmonary disease with acute lower respiratory infection: Secondary | ICD-10-CM | POA: Diagnosis present

## 2020-11-14 DIAGNOSIS — R64 Cachexia: Secondary | ICD-10-CM | POA: Diagnosis present

## 2020-11-14 DIAGNOSIS — I10 Essential (primary) hypertension: Secondary | ICD-10-CM | POA: Diagnosis present

## 2020-11-14 DIAGNOSIS — G9341 Metabolic encephalopathy: Secondary | ICD-10-CM | POA: Diagnosis present

## 2020-11-14 DIAGNOSIS — R739 Hyperglycemia, unspecified: Secondary | ICD-10-CM | POA: Diagnosis present

## 2020-11-14 DIAGNOSIS — R6521 Severe sepsis with septic shock: Secondary | ICD-10-CM | POA: Diagnosis present

## 2020-11-14 DIAGNOSIS — J690 Pneumonitis due to inhalation of food and vomit: Secondary | ICD-10-CM | POA: Diagnosis present

## 2020-11-14 DIAGNOSIS — R4182 Altered mental status, unspecified: Secondary | ICD-10-CM | POA: Diagnosis not present

## 2020-11-14 DIAGNOSIS — E878 Other disorders of electrolyte and fluid balance, not elsewhere classified: Secondary | ICD-10-CM | POA: Diagnosis present

## 2020-11-14 DIAGNOSIS — N179 Acute kidney failure, unspecified: Secondary | ICD-10-CM | POA: Diagnosis present

## 2020-11-14 DIAGNOSIS — I252 Old myocardial infarction: Secondary | ICD-10-CM | POA: Diagnosis not present

## 2020-11-14 LAB — I-STAT ARTERIAL BLOOD GAS, ED
Acid-base deficit: 5 mmol/L — ABNORMAL HIGH (ref 0.0–2.0)
Acid-base deficit: 6 mmol/L — ABNORMAL HIGH (ref 0.0–2.0)
Bicarbonate: 20.7 mmol/L (ref 20.0–28.0)
Bicarbonate: 19.1 mmol/L — ABNORMAL LOW (ref 20.0–28.0)
Calcium, Ion: 1.16 mmol/L (ref 1.15–1.40)
Calcium, Ion: 1.18 mmol/L (ref 1.15–1.40)
HCT: 30 % — ABNORMAL LOW (ref 39.0–52.0)
HCT: 28 % — ABNORMAL LOW (ref 39.0–52.0)
Hemoglobin: 10.2 g/dL — ABNORMAL LOW (ref 13.0–17.0)
Hemoglobin: 9.5 g/dL — ABNORMAL LOW (ref 13.0–17.0)
O2 Saturation: 100 %
O2 Saturation: 96 %
Patient temperature: 99.5
Patient temperature: 98.5
Potassium: 3.1 mmol/L — ABNORMAL LOW (ref 3.5–5.1)
Potassium: 3.3 mmol/L — ABNORMAL LOW (ref 3.5–5.1)
Sodium: 163 mmol/L (ref 135–145)
Sodium: 160 mmol/L — ABNORMAL HIGH (ref 135–145)
TCO2: 22 mmol/L (ref 22–32)
TCO2: 20 mmol/L — ABNORMAL LOW (ref 22–32)
pCO2 arterial: 42.9 mmHg (ref 32.0–48.0)
pCO2 arterial: 34 mmHg (ref 32.0–48.0)
pH, Arterial: 7.295 — ABNORMAL LOW (ref 7.350–7.450)
pH, Arterial: 7.357 (ref 7.350–7.450)
pO2, Arterial: 291 mmHg — ABNORMAL HIGH (ref 83.0–108.0)
pO2, Arterial: 81 mmHg — ABNORMAL LOW (ref 83.0–108.0)

## 2020-11-14 LAB — URINALYSIS, ROUTINE W REFLEX MICROSCOPIC
Bacteria, UA: NONE SEEN
Bilirubin Urine: NEGATIVE
Glucose, UA: NEGATIVE mg/dL
Hgb urine dipstick: NEGATIVE
Ketones, ur: NEGATIVE mg/dL
Leukocytes,Ua: NEGATIVE
Nitrite: NEGATIVE
Protein, ur: 30 mg/dL — AB
Specific Gravity, Urine: 1.014 (ref 1.005–1.030)
pH: 5 (ref 5.0–8.0)

## 2020-11-14 LAB — COMPREHENSIVE METABOLIC PANEL
ALT: 18 U/L (ref 0–44)
AST: 32 U/L (ref 15–41)
Albumin: 2.2 g/dL — ABNORMAL LOW (ref 3.5–5.0)
Alkaline Phosphatase: 35 U/L — ABNORMAL LOW (ref 38–126)
Anion gap: 15 (ref 5–15)
BUN: 81 mg/dL — ABNORMAL HIGH (ref 8–23)
CO2: 19 mmol/L — ABNORMAL LOW (ref 22–32)
Calcium: 7.9 mg/dL — ABNORMAL LOW (ref 8.9–10.3)
Chloride: 128 mmol/L — ABNORMAL HIGH (ref 98–111)
Creatinine, Ser: 2.93 mg/dL — ABNORMAL HIGH (ref 0.61–1.24)
GFR, Estimated: 23 mL/min — ABNORMAL LOW (ref >60)
Glucose, Bld: 196 mg/dL — ABNORMAL HIGH (ref 70–99)
Potassium: 3.9 mmol/L (ref 3.5–5.1)
Sodium: 162 mmol/L (ref 135–145)
Total Bilirubin: 0.3 mg/dL (ref 0.3–1.2)
Total Protein: 5.6 g/dL — ABNORMAL LOW (ref 6.5–8.1)

## 2020-11-14 LAB — MAGNESIUM
Magnesium: 2 mg/dL (ref 1.7–2.4)
Magnesium: 2 mg/dL (ref 1.7–2.4)
Magnesium: 2 mg/dL (ref 1.7–2.4)

## 2020-11-14 LAB — BASIC METABOLIC PANEL
Anion gap: 14 (ref 5–15)
Anion gap: 12 (ref 5–15)
BUN: 76 mg/dL — ABNORMAL HIGH (ref 8–23)
BUN: 68 mg/dL — ABNORMAL HIGH (ref 8–23)
CO2: 19 mmol/L — ABNORMAL LOW (ref 22–32)
CO2: 19 mmol/L — ABNORMAL LOW (ref 22–32)
Calcium: 7.3 mg/dL — ABNORMAL LOW (ref 8.9–10.3)
Calcium: 7.5 mg/dL — ABNORMAL LOW (ref 8.9–10.3)
Chloride: 127 mmol/L — ABNORMAL HIGH (ref 98–111)
Chloride: 125 mmol/L — ABNORMAL HIGH (ref 98–111)
Creatinine, Ser: 2.38 mg/dL — ABNORMAL HIGH (ref 0.61–1.24)
Creatinine, Ser: 1.49 mg/dL — ABNORMAL HIGH (ref 0.61–1.24)
GFR, Estimated: 29 mL/min — ABNORMAL LOW (ref >60)
GFR, Estimated: 51 mL/min — ABNORMAL LOW (ref >60)
Glucose, Bld: 236 mg/dL — ABNORMAL HIGH (ref 70–99)
Glucose, Bld: 246 mg/dL — ABNORMAL HIGH (ref 70–99)
Potassium: 3.5 mmol/L (ref 3.5–5.1)
Potassium: 3.1 mmol/L — ABNORMAL LOW (ref 3.5–5.1)
Sodium: 160 mmol/L — ABNORMAL HIGH (ref 135–145)
Sodium: 156 mmol/L — ABNORMAL HIGH (ref 135–145)

## 2020-11-14 LAB — CBC WITH DIFFERENTIAL/PLATELET
Abs Immature Granulocytes: 0.17 10*3/uL — ABNORMAL HIGH (ref 0.00–0.07)
Basophils Absolute: 0 10*3/uL (ref 0.0–0.1)
Basophils Relative: 0 %
Eosinophils Absolute: 0 10*3/uL (ref 0.0–0.5)
Eosinophils Relative: 0 %
HCT: 42 % (ref 39.0–52.0)
Hemoglobin: 11.9 g/dL — ABNORMAL LOW (ref 13.0–17.0)
Immature Granulocytes: 2 %
Lymphocytes Relative: 5 %
Lymphs Abs: 0.5 10*3/uL — ABNORMAL LOW (ref 0.7–4.0)
MCH: 29.8 pg (ref 26.0–34.0)
MCHC: 28.3 g/dL — ABNORMAL LOW (ref 30.0–36.0)
MCV: 105.3 fL — ABNORMAL HIGH (ref 80.0–100.0)
Monocytes Absolute: 0.4 10*3/uL (ref 0.1–1.0)
Monocytes Relative: 4 %
Neutro Abs: 8 10*3/uL — ABNORMAL HIGH (ref 1.7–7.7)
Neutrophils Relative %: 89 %
Platelets: 125 10*3/uL — ABNORMAL LOW (ref 150–400)
RBC: 3.99 MIL/uL — ABNORMAL LOW (ref 4.22–5.81)
RDW: 13.9 % (ref 11.5–15.5)
WBC: 9 10*3/uL (ref 4.0–10.5)
nRBC: 0.7 % — ABNORMAL HIGH (ref 0.0–0.2)

## 2020-11-14 LAB — CSF CELL COUNT WITH DIFFERENTIAL
RBC Count, CSF: 7 /mm3 — ABNORMAL HIGH
Tube #: 4
WBC, CSF: 4 /mm3 (ref 0–5)

## 2020-11-14 LAB — COOXEMETRY PANEL
Carboxyhemoglobin: 0.4 % — ABNORMAL LOW (ref 0.5–1.5)
Methemoglobin: 0.8 % (ref 0.0–1.5)
O2 Saturation: 97.9 %
Total hemoglobin: 13.8 g/dL (ref 12.0–16.0)

## 2020-11-14 LAB — LACTIC ACID, PLASMA
Lactic Acid, Venous: 1.2 mmol/L (ref 0.5–1.9)
Lactic Acid, Venous: 4 mmol/L (ref 0.5–1.9)
Lactic Acid, Venous: 1.5 mmol/L (ref 0.5–1.9)

## 2020-11-14 LAB — I-STAT CHEM 8, ED
BUN: 74 mg/dL — ABNORMAL HIGH (ref 8–23)
Calcium, Ion: 1.04 mmol/L — ABNORMAL LOW (ref 1.15–1.40)
Chloride: 130 mmol/L — ABNORMAL HIGH (ref 98–111)
Creatinine, Ser: 2.4 mg/dL — ABNORMAL HIGH (ref 0.61–1.24)
Glucose, Bld: 156 mg/dL — ABNORMAL HIGH (ref 70–99)
HCT: 30 % — ABNORMAL LOW (ref 39.0–52.0)
Hemoglobin: 10.2 g/dL — ABNORMAL LOW (ref 13.0–17.0)
Potassium: 3.1 mmol/L — ABNORMAL LOW (ref 3.5–5.1)
Sodium: 164 mmol/L (ref 135–145)
TCO2: 20 mmol/L — ABNORMAL LOW (ref 22–32)

## 2020-11-14 LAB — ECHOCARDIOGRAM COMPLETE
AR max vel: 1.82 cm2
AV Area VTI: 1.63 cm2
AV Area mean vel: 1.77 cm2
AV Mean grad: 9 mmHg
AV Peak grad: 14.4 mmHg
Ao pk vel: 1.9 m/s
Area-P 1/2: 2.99 cm2
Height: 66 in
P 1/2 time: 404 ms
S' Lateral: 3.1 cm
Weight: 2571.45 [oz_av]

## 2020-11-14 LAB — HEPATIC FUNCTION PANEL
ALT: 19 U/L (ref 0–44)
ALT: 20 U/L (ref 0–44)
AST: 33 U/L (ref 15–41)
AST: 34 U/L (ref 15–41)
Albumin: 2 g/dL — ABNORMAL LOW (ref 3.5–5.0)
Albumin: 2.1 g/dL — ABNORMAL LOW (ref 3.5–5.0)
Alkaline Phosphatase: 31 U/L — ABNORMAL LOW (ref 38–126)
Alkaline Phosphatase: 32 U/L — ABNORMAL LOW (ref 38–126)
Bilirubin, Direct: <0.1 mg/dL (ref 0.0–0.2)
Bilirubin, Direct: <0.1 mg/dL (ref 0.0–0.2)
Total Bilirubin: 0.4 mg/dL (ref 0.3–1.2)
Total Bilirubin: 0.5 mg/dL (ref 0.3–1.2)
Total Protein: 5.5 g/dL — ABNORMAL LOW (ref 6.5–8.1)
Total Protein: 5.7 g/dL — ABNORMAL LOW (ref 6.5–8.1)

## 2020-11-14 LAB — PROTIME-INR
INR: 1.4 — ABNORMAL HIGH (ref 0.8–1.2)
Prothrombin Time: 16.9 seconds — ABNORMAL HIGH (ref 11.4–15.2)

## 2020-11-14 LAB — PROTEIN AND GLUCOSE, CSF
Glucose, CSF: 125 mg/dL — ABNORMAL HIGH (ref 40–70)
Total  Protein, CSF: 24 mg/dL (ref 15–45)

## 2020-11-14 LAB — I-STAT VENOUS BLOOD GAS, ED
Acid-base deficit: 5 mmol/L — ABNORMAL HIGH (ref 0.0–2.0)
Bicarbonate: 20 mmol/L (ref 20.0–28.0)
Calcium, Ion: 1.05 mmol/L — ABNORMAL LOW (ref 1.15–1.40)
HCT: 29 % — ABNORMAL LOW (ref 39.0–52.0)
Hemoglobin: 9.9 g/dL — ABNORMAL LOW (ref 13.0–17.0)
O2 Saturation: 99 %
Potassium: 3.1 mmol/L — ABNORMAL LOW (ref 3.5–5.1)
Sodium: 164 mmol/L (ref 135–145)
TCO2: 21 mmol/L — ABNORMAL LOW (ref 22–32)
pCO2, Ven: 37.5 mmHg — ABNORMAL LOW (ref 44.0–60.0)
pH, Ven: 7.336 (ref 7.250–7.430)
pO2, Ven: 134 mmHg — ABNORMAL HIGH (ref 32.0–45.0)

## 2020-11-14 LAB — RESP PANEL BY RT-PCR (FLU A&B, COVID) ARPGX2
Influenza A by PCR: NEGATIVE
Influenza B by PCR: NEGATIVE
SARS Coronavirus 2 by RT PCR: POSITIVE — AB

## 2020-11-14 LAB — PHOSPHORUS
Phosphorus: 4.6 mg/dL (ref 2.5–4.6)
Phosphorus: 3.6 mg/dL (ref 2.5–4.6)
Phosphorus: 3.1 mg/dL (ref 2.5–4.6)

## 2020-11-14 LAB — HEMOGLOBIN A1C
Hgb A1c MFr Bld: 5.7 % — ABNORMAL HIGH (ref 4.8–5.6)
Mean Plasma Glucose: 116.89 mg/dL

## 2020-11-14 LAB — APTT: aPTT: 31 seconds (ref 24–36)

## 2020-11-14 LAB — LACTATE DEHYDROGENASE: LDH: 238 U/L — ABNORMAL HIGH (ref 98–192)

## 2020-11-14 LAB — FERRITIN: Ferritin: 902 ng/mL — ABNORMAL HIGH (ref 24–336)

## 2020-11-14 LAB — AMMONIA: Ammonia: 37 umol/L — ABNORMAL HIGH (ref 9–35)

## 2020-11-14 LAB — MRSA PCR SCREENING: MRSA by PCR: NEGATIVE

## 2020-11-14 LAB — GLUCOSE, CAPILLARY
Glucose-Capillary: 165 mg/dL — ABNORMAL HIGH (ref 70–99)
Glucose-Capillary: 140 mg/dL — ABNORMAL HIGH (ref 70–99)

## 2020-11-14 LAB — TRIGLYCERIDES: Triglycerides: 79 mg/dL (ref <150)

## 2020-11-14 LAB — TROPONIN I (HIGH SENSITIVITY)
Troponin I (High Sensitivity): 1529 ng/L (ref <18)
Troponin I (High Sensitivity): 954 ng/L (ref <18)
Troponin I (High Sensitivity): 1078 ng/L

## 2020-11-14 LAB — VALPROIC ACID LEVEL: Valproic Acid Lvl: <10 ug/mL — ABNORMAL LOW (ref 50.0–100.0)

## 2020-11-14 LAB — CK: Total CK: 90 U/L (ref 49–397)

## 2020-11-14 LAB — CBG MONITORING, ED
Glucose-Capillary: 209 mg/dL — ABNORMAL HIGH (ref 70–99)
Glucose-Capillary: 222 mg/dL — ABNORMAL HIGH (ref 70–99)

## 2020-11-14 LAB — CORTISOL: Cortisol, Plasma: 41.6 ug/dL

## 2020-11-14 LAB — PROCALCITONIN: Procalcitonin: 14.84 ng/mL

## 2020-11-14 MED ORDER — ORAL CARE MOUTH RINSE
15.0000 mL | OROMUCOSAL | Status: DC
  Administered 2020-11-14 – 2020-11-15 (×11): 15 mL via OROMUCOSAL

## 2020-11-14 MED ORDER — POLYETHYLENE GLYCOL 3350 17 G PO PACK: 17.0000 g | PACK | Freq: Every day | ORAL | Status: DC | PRN

## 2020-11-14 MED ORDER — ONDANSETRON HCL 4 MG/2ML IJ SOLN: 4.0000 mg | Freq: Four times a day (QID) | INTRAMUSCULAR | Status: DC | PRN

## 2020-11-14 MED ORDER — LACTATED RINGERS IV BOLUS
500.0000 mL | Freq: Once | INTRAVENOUS | Status: AC
  Administered 2020-11-14: 500 mL via INTRAVENOUS

## 2020-11-14 MED ORDER — DEXMEDETOMIDINE HCL IN NACL 400 MCG/100ML IV SOLN
0.0000 ug/kg/h | INTRAVENOUS | Status: DC
  Administered 2020-11-14 (×2): 1.2 ug/kg/h via INTRAVENOUS
  Filled 2020-11-14 (×4): qty 100

## 2020-11-14 MED ORDER — POTASSIUM CHLORIDE 20 MEQ PO PACK
40.0000 meq | PACK | Freq: Once | ORAL | Status: AC
  Administered 2020-11-14: 40 meq
  Filled 2020-11-14: qty 2

## 2020-11-14 MED ORDER — SODIUM CHLORIDE 0.9 % IV SOLN
0.5000 mg/kg/h | INTRAVENOUS | Status: DC
  Administered 2020-11-14: 0.5 mg/kg/h via INTRAVENOUS
  Filled 2020-11-14: qty 5

## 2020-11-14 MED ORDER — POLYETHYLENE GLYCOL 3350 17 G PO PACK
17.0000 g | PACK | Freq: Every day | ORAL | Status: DC
  Administered 2020-11-15 – 2020-11-18 (×3): 17 g
  Filled 2020-11-14 (×3): qty 1

## 2020-11-14 MED ORDER — MIDAZOLAM HCL 2 MG/2ML IJ SOLN
2.0000 mg | INTRAMUSCULAR | Status: DC | PRN
  Administered 2020-11-14: 2 mg via INTRAVENOUS
  Filled 2020-11-14: qty 2

## 2020-11-14 MED ORDER — CHLORHEXIDINE GLUCONATE CLOTH 2 % EX PADS
6.0000 | MEDICATED_PAD | Freq: Every day | CUTANEOUS | Status: DC
  Administered 2020-11-15 – 2020-11-17 (×3): 6 via TOPICAL

## 2020-11-14 MED ORDER — FENTANYL CITRATE (PF) 100 MCG/2ML IJ SOLN
25.0000 ug | INTRAMUSCULAR | Status: DC | PRN
  Administered 2020-11-14 (×2): 25 ug via INTRAVENOUS
  Filled 2020-11-14 (×2): qty 2

## 2020-11-14 MED ORDER — VANCOMYCIN HCL IN DEXTROSE 1-5 GM/200ML-% IV SOLN: 1000.0000 mg | INTRAVENOUS | Status: DC

## 2020-11-14 MED ORDER — METHYLPREDNISOLONE SODIUM SUCC 40 MG IJ SOLR
40.0000 mg | Freq: Two times a day (BID) | INTRAMUSCULAR | Status: DC
  Administered 2020-11-14 – 2020-11-15 (×4): 40 mg via INTRAVENOUS
  Filled 2020-11-14 (×4): qty 1

## 2020-11-14 MED ORDER — MIDAZOLAM HCL 2 MG/2ML IJ SOLN: 2.0000 mg | INTRAMUSCULAR | Status: DC | PRN

## 2020-11-14 MED ORDER — FREE WATER
200.0000 mL | Status: DC
  Administered 2020-11-14 – 2020-11-15 (×5): 200 mL

## 2020-11-14 MED ORDER — ENOXAPARIN SODIUM 30 MG/0.3ML ~~LOC~~ SOLN
30.0000 mg | Freq: Every day | SUBCUTANEOUS | Status: DC
  Administered 2020-11-14: 30 mg via SUBCUTANEOUS
  Filled 2020-11-14: qty 0.3

## 2020-11-14 MED ORDER — SODIUM CHLORIDE 0.9 % IV SOLN
2.0000 g | Freq: Every day | INTRAVENOUS | Status: DC
  Administered 2020-11-14: 2 g via INTRAVENOUS
  Filled 2020-11-14: qty 2

## 2020-11-14 MED ORDER — VASOPRESSIN 20 UNITS/100 ML INFUSION FOR SHOCK
0.0000 [IU]/min | INTRAVENOUS | Status: DC
  Administered 2020-11-14: 0.03 [IU]/min via INTRAVENOUS
  Filled 2020-11-14: qty 100

## 2020-11-14 MED ORDER — INSULIN ASPART 100 UNIT/ML ~~LOC~~ SOLN
0.0000 [IU] | SUBCUTANEOUS | Status: DC
  Administered 2020-11-14: 3 [IU] via SUBCUTANEOUS
  Administered 2020-11-14: 1 [IU] via SUBCUTANEOUS
  Administered 2020-11-14: 2 [IU] via SUBCUTANEOUS
  Administered 2020-11-15 (×2): 1 [IU] via SUBCUTANEOUS
  Administered 2020-11-15: 2 [IU] via SUBCUTANEOUS
  Administered 2020-11-15: 1 [IU] via SUBCUTANEOUS
  Administered 2020-11-15: 2 [IU] via SUBCUTANEOUS
  Administered 2020-11-16 – 2020-11-17 (×5): 1 [IU] via SUBCUTANEOUS
  Administered 2020-11-17: 2 [IU] via SUBCUTANEOUS
  Administered 2020-11-17: 7 [IU] via SUBCUTANEOUS
  Administered 2020-11-17 – 2020-11-18 (×3): 3 [IU] via SUBCUTANEOUS
  Administered 2020-11-18: 2 [IU] via SUBCUTANEOUS
  Administered 2020-11-18: 3 [IU] via SUBCUTANEOUS

## 2020-11-14 MED ORDER — FENTANYL CITRATE (PF) 100 MCG/2ML IJ SOLN
25.0000 ug | INTRAMUSCULAR | Status: DC | PRN
  Administered 2020-11-15: 100 ug via INTRAVENOUS
  Filled 2020-11-14: qty 2

## 2020-11-14 MED ORDER — DEXAMETHASONE SODIUM PHOSPHATE 10 MG/ML IJ SOLN
6.0000 mg | INTRAMUSCULAR | Status: DC
  Administered 2020-11-14: 6 mg via INTRAVENOUS
  Filled 2020-11-14: qty 1

## 2020-11-14 MED ORDER — VANCOMYCIN HCL 1500 MG/300ML IV SOLN: 1500.0000 mg | INTRAVENOUS | Status: DC

## 2020-11-14 MED ORDER — SODIUM CHLORIDE 0.9 % IV SOLN: INTRAVENOUS | Status: DC | PRN

## 2020-11-14 MED ORDER — FAMOTIDINE IN NACL 20-0.9 MG/50ML-% IV SOLN
20.0000 mg | INTRAVENOUS | Status: DC
  Administered 2020-11-14: 20 mg via INTRAVENOUS
  Filled 2020-11-14 (×2): qty 50

## 2020-11-14 MED ORDER — PANTOPRAZOLE SODIUM 40 MG IV SOLR: 40.0000 mg | Freq: Every day | INTRAVENOUS | Status: DC

## 2020-11-14 MED ORDER — SODIUM CHLORIDE 0.9 % IV SOLN
200.0000 mg | Freq: Once | INTRAVENOUS | Status: AC
  Administered 2020-11-14: 200 mg via INTRAVENOUS
  Filled 2020-11-14: qty 40

## 2020-11-14 MED ORDER — VITAL HIGH PROTEIN PO LIQD
1000.0000 mL | ORAL | Status: DC
  Administered 2020-11-14: 1000 mL
  Filled 2020-11-14 (×2): qty 1000

## 2020-11-14 MED ORDER — SODIUM CHLORIDE 0.9 % IV SOLN
100.0000 mg | Freq: Every day | INTRAVENOUS | Status: AC
  Administered 2020-11-15 – 2020-11-18 (×4): 100 mg via INTRAVENOUS
  Filled 2020-11-14 (×6): qty 20

## 2020-11-14 MED ORDER — DOCUSATE SODIUM 50 MG/5ML PO LIQD: 100.0000 mg | Freq: Two times a day (BID) | ORAL | Status: DC | PRN

## 2020-11-14 MED ORDER — PROSOURCE TF PO LIQD
45.0000 mL | Freq: Two times a day (BID) | ORAL | Status: DC
  Administered 2020-11-14 – 2020-11-15 (×3): 45 mL
  Filled 2020-11-14 (×5): qty 45

## 2020-11-14 MED ORDER — LACTATED RINGERS IV SOLN: INTRAVENOUS | Status: DC

## 2020-11-14 MED ORDER — IPRATROPIUM-ALBUTEROL 0.5-2.5 (3) MG/3ML IN SOLN: 3.0000 mL | RESPIRATORY_TRACT | Status: DC | PRN

## 2020-11-14 MED ORDER — NOREPINEPHRINE 4 MG/250ML-% IV SOLN
0.0000 ug/min | INTRAVENOUS | Status: DC
  Administered 2020-11-14: 17 ug/min via INTRAVENOUS
  Administered 2020-11-14: 2 ug/min via INTRAVENOUS
  Administered 2020-11-14: 5 ug/min via INTRAVENOUS
  Filled 2020-11-14 (×4): qty 250

## 2020-11-14 MED ORDER — CHLORHEXIDINE GLUCONATE 0.12% ORAL RINSE (MEDLINE KIT)
15.0000 mL | Freq: Two times a day (BID) | OROMUCOSAL | Status: DC
  Administered 2020-11-14 – 2020-11-15 (×3): 15 mL via OROMUCOSAL

## 2020-11-14 MED ORDER — DOCUSATE SODIUM 50 MG/5ML PO LIQD
100.0000 mg | Freq: Two times a day (BID) | ORAL | Status: DC
  Administered 2020-11-14 – 2020-11-18 (×6): 100 mg
  Filled 2020-11-14 (×8): qty 10

## 2020-11-14 MED ORDER — ENOXAPARIN SODIUM 40 MG/0.4ML ~~LOC~~ SOLN
40.0000 mg | Freq: Every day | SUBCUTANEOUS | Status: DC
  Administered 2020-11-15 – 2020-11-17 (×3): 40 mg via SUBCUTANEOUS
  Filled 2020-11-14 (×3): qty 0.4

## 2020-11-14 MED ORDER — ASPIRIN 81 MG PO CHEW
81.0000 mg | CHEWABLE_TABLET | Freq: Every day | ORAL | Status: DC
  Filled 2020-11-14: qty 1

## 2020-11-14 MED ORDER — ASPIRIN 81 MG PO CHEW
81.0000 mg | CHEWABLE_TABLET | Freq: Every day | ORAL | Status: DC
  Administered 2020-11-14 – 2020-11-18 (×4): 81 mg
  Filled 2020-11-14 (×3): qty 1

## 2020-11-14 NOTE — Progress Notes (Signed)
eLink Physician-Brief Progress Note Patient Name: Bryan Davies DOB: 17-May-1951 MRN: [id number]   Date of Service  11/14/2020  HPI/Events of Note  Notified of CVP 8 on positive pressure ventilation. Still on norepinephrine but able to titrate down  eICU Interventions  Ordered another 500 cc LR bolus bolus CVP > 8     Intervention Category Major Interventions: Hypotension - evaluation and management  Darl Pikes 11/14/2020, 11:37 PM

## 2020-11-14 NOTE — Progress Notes (Signed)
NAME:  Bryan Davies, MRN:  [id number], DOB:  1951-07-13, LOS: 0 ADMISSION DATE:  2020-12-03, CONSULTATION DATE:  1/16 REFERRING MD:  Cardama, CHIEF COMPLAINT:  Dyspnea   Brief History:  70 y/o male presented from an assisted living facility on 1/17 with dyspnea, hypoxemia requiring intubation in the ER.  Found to have COVID pneumonia.  Past Medical History:  Anxiety Hypertension Shingles R MCA stroke CAD Bipoloar disorder toardive dyskinesia Hyperlipidemia Tobacco abuse  Significant Hospital Events:  1/16 presented to ER  Consults:    Procedures:  1/17 lumbar puncture > WBC 4  Significant Diagnostic Tests:  1/17 CT head > chronic R MCA infarct, NAICP  Micro Data:  1/17 SARS COV 2  Positive, Flu negative 1/17 blood culture >  1/17 urine culture >   Antimicrobials:  1/16 vanc >  1/16 cefepime >  1/16 flagyl x1 1/17 remdesivir >    Interim History / Subjective:   Sedated  levo, vaso Ketamine infusion Mild dyssynchrony   Objective   Blood pressure 117/69, pulse 85, temperature 100.3 F (37.9 C), resp. rate (!) 26, height 5\' 6"  (1.676 m), weight 72.9 kg, SpO2 95 %.    Vent Mode: PRVC FiO2 (%):  [40 %-100 %] 40 % Set Rate:  [18 bmp] 18 bmp Vt Set:  [355 mL-510 mL] 355 mL PEEP:  [5 cmH20] 5 cmH20 Plateau Pressure:  [15 cmH20-17 cmH20] 15 cmH20  No intake or output data in the 24 hours ending 11/14/20 0737 Filed Weights   11/14/20 0023  Weight: 72.9 kg    Examination: General:  In bed on vent HENT: NCAT ETT in place PULM: Wheezing bilaterally, vent supported breathing CV: RRR, no mgr GI: BS+, soft, nontender MSK: normal bulk and tone Derm: diaphoresis Neuro: sedated on vent  1/17 CXR > ETT, CVL in place, worsening bibasilar airspace disease  Resolved Hospital Problem list     Assessment & Plan:  Acute metabolic encephalopathy in setting of hypoxemia and old stroke and hypernatremia He does not have meningitis Need for sedation for  mechanical ventilation RASS go -1 to -2 Stop ketamine infusion Start PAD protocol: precedex infusion with prn versed and fentanyl Check depakote level Hold haldol and valproate Check LFT  Acute respiratory failure with hypoxemia due to inability to protect airway and likely aspiration pneumonia smoker Send resp culture Continue vanc/cefepime Change to conventional ventilation strategy Full mechanical vent support VAP prevention Daily WUA/SBT Monitor plateau duoneb prn  AKI> presumably volume depletion given hypernatremia Continue LR at 100cc/hr Monitor BMET and UOP Replace electrolytes as needed  Septic shock, likely component of volume depletion prior to admission Ischemic cardiomyopathy, covid cardiomyopathy? NSTEMI Check CVP Check Coox Continue levophed titrated to MAP > 65 Continue vasopressin Check echo Repeat troponin Monitor lactic acid  Hyperglycemia SSI  Hypernatremia Add free water via tube    Best practice (evaluated daily)  Diet: start tube feeding Pain/Anxiety/Delirium protocol (if indicated): PAD protocol, as above VAP protocol (if indicated): yes DVT prophylaxis: lovenox to continue GI prophylaxis: Pantoprazole for stress ulcer prophylaxis Glucose control: SSI Mobility: bed rest Disposition:remain in ICU  Goals of Care:  Last date of multidisciplinary goals of care discussion: hasn't happened, intubated on arrival, girlfriend not answering phone when I called on 1/17 AM Family and staff present: n/a Summary of discussion: n/a Follow up goals of care discussion due: as soon as possible Code Status: FULL  Labs   CBC: Recent Labs  Lab 03-Dec-2020 2349 11/14/20 0157 11/14/20 0228  11/14/20 0420 11/14/20 0618  WBC 9.0  --   --  11.0*  --   NEUTROABS 8.0*  --   --   --   --   HGB 11.9* 10.2*  9.9* 10.2* 9.0* 9.5*  HCT 42.0 30.0*  29.0* 30.0* 31.6* 28.0*  MCV 105.3*  --   --  103.3*  --   PLT 125*  --   --  132*  --     Basic  Metabolic Panel: Recent Labs  Lab 2020-11-29 2349 11/14/20 0157 11/14/20 0228 11/14/20 0420 11/14/20 0618  NA 162* 164*  164* 163* 160* 160*  K 3.9 3.1*  3.1* 3.1* 3.5 3.3*  CL 128* 130*  --  127*  --   CO2 19*  --   --  19*  --   GLUCOSE 196* 156*  --  236*  --   BUN 81* 74*  --  76*  --   CREATININE 2.93* 2.40*  --  2.38*  2.47*  --   CALCIUM 7.9*  --   --  7.3*  --   MG  --   --   --  2.0  --   PHOS  --   --   --  4.6  --    GFR: Estimated Creatinine Clearance: 26.2 mL/min (A) (by C-G formula based on SCr of 2.47 mg/dL (H)). Recent Labs  Lab 2020-11-29 2349 11/14/20 0000 11/14/20 0420  WBC 9.0  --  11.0*  LATICACIDVEN 1.2 4.0*  --     Liver Function Tests: Recent Labs  Lab Nov 29, 2020 2349  AST 32  ALT 18  ALKPHOS 35*  BILITOT 0.3  PROT 5.6*  ALBUMIN 2.2*   No results for input(s): LIPASE, AMYLASE in the last 168 hours. No results for input(s): AMMONIA in the last 168 hours.  ABG    Component Value Date/Time   PHART 7.357 11/14/2020 0618   PCO2ART 34.0 11/14/2020 0618   PO2ART 81 (L) 11/14/2020 0618   HCO3 19.1 (L) 11/14/2020 0618   TCO2 20 (L) 11/14/2020 0618   ACIDBASEDEF 6.0 (H) 11/14/2020 0618   O2SAT 96.0 11/14/2020 0618     Coagulation Profile: Recent Labs  Lab November 29, 2020 2349  INR 1.4*    Cardiac Enzymes: Recent Labs  Lab 29-Nov-2020 2349  CKTOTAL 90    HbA1C: Hgb A1c MFr Bld  Date/Time Value Ref Range Status  09/01/2020 02:56 AM 6.0 (H) 4.8 - 5.6 % Final    Comment:    (NOTE) Pre diabetes:          5.7%-6.4%  Diabetes:              >6.4%  Glycemic control for   <7.0% adults with diabetes   01/16/2019 05:42 AM 6.6 (H) 4.8 - 5.6 % Final    Comment:    (NOTE) Pre diabetes:          5.7%-6.4% Diabetes:              >6.4% Glycemic control for   <7.0% adults with diabetes     CBG: No results for input(s): GLUCAP in the last 168 hours.   Critical care time: 35 minutes     Heber , MD [facility] Pager:  [phone] Cell: [phone] If no response, call [phone]

## 2020-11-14 NOTE — Progress Notes (Signed)
  Echocardiogram 2D Echocardiogram has been performed.  Augustine Radar 11/14/2020, 10:15 AM

## 2020-11-14 NOTE — Progress Notes (Signed)
Pharmacy Antibiotic Note  Bryan Davies is a 70 y.o. male admitted on 11/18/2020 with VDRF, COVID+, possible sepsis.  Pharmacy has been consulted for Vancomycin  dosing.  Vancomycin 1500 mg IV given in ED at 0200  Plan: Vancomycin 1 g IV q48h  Height: 5\' 6"  (167.6 cm) Weight: 72.9 kg (160 lb 11.5 oz) IBW/kg (Calculated) : 63.8  Temp (24hrs), Avg:101 F (38.3 C), Min:100.3 F (37.9 C), Max:104.7 F (40.4 C)  Recent Labs  Lab 11/05/2020 2349 11/14/20 0000 11/14/20 0157  WBC 9.0  --   --   CREATININE 2.93*  --  2.40*  LATICACIDVEN 1.2 4.0*  --     Estimated Creatinine Clearance: 27 mL/min (A) (by C-G formula based on SCr of 2.4 mg/dL (H)).    Allergies  Allergen Reactions  . Pollen Extract Other (See Comments)    Sneezing     11/16/20 11/14/2020 4:47 AM

## 2020-11-14 NOTE — Procedures (Signed)
Arterial Catheter Insertion Procedure Note  JIMMIE RUETER  [id number]  [birth year]  Date:11/14/20  Time:9:14 PM    Provider Performing: Inez Pilgrim    Procedure: Insertion of Arterial Line (38333) without US guidance  Indication(s) Blood pressure monitoring and/or need for frequent ABGs  Consent Unable to obtain consent due to emergent nature of procedure.  Anesthesia None   Time Out Verified patient identification, verified procedure, site/side was marked, verified correct patient position, special equipment/implants available, medications/allergies/relevant history reviewed, required imaging and test results available.   Sterile Technique Maximal sterile technique including full sterile barrier drape, hand hygiene, sterile gown, sterile gloves, mask, hair covering, sterile ultrasound probe cover (if used).   Procedure Description Area of catheter insertion was cleaned with chlorhexidine and draped in sterile fashion. Without real-time ultrasound guidance an arterial catheter was placed into the right radial artery.  Appropriate arterial tracings confirmed on monitor.     Complications/Tolerance None; patient tolerated the procedure well.   EBL Minimal   Specimen(s) None

## 2020-11-14 NOTE — Op Note (Signed)
Lumbar Puncture Procedure Note  ZOLTON DOWSON  [id number]  1951/01/01  Date:11/14/20  Time:3:53 AM   Provider Performing:Andriel Omalley R Myson Levi   Procedure: Lumbar Puncture (18288)  Indication(s) Rule out meningitis  Consent Unable to obtain consent due to emergent nature of procedure.  Anesthesia Topical only with 1% lidocaine    Time Out Verified patient identification, verified procedure, site/side was marked, verified correct patient position, special equipment/implants available, medications/allergies/relevant history reviewed, required imaging and test results available.   Sterile Technique Maximal sterile technique including sterile barrier drape, hand hygiene, sterile gown, sterile gloves, mask, hair covering.    Procedure Description Using palpation, approximate location of L3-L4 space identified.   Lidocaine used to anesthetize skin and subcutaneous tissue overlying this area.  A 20g spinal needle was then used to access the subarachnoid space. Opening pressure:Not obtained. Closing pressure:Not obtained. 20ml CSF (clear colorless) obtained.  Complications/Tolerance None; patient tolerated the procedure well.   EBL Minimal   Specimen(s) CSF

## 2020-11-14 NOTE — H&P (Signed)
NAME:  Bryan Davies, MRN:  [id number], DOB:  [birth year], LOS: 0 ADMISSION DATE:  11/05/2020,   Brief History:  70 y/o male that presented with COVID pna  History of Present Illness:  70 y/o white male that presented to the ER from ALF for AMS and hypoxia.  The pt had just finished a course of abx for pna.  While at the ALF the pt had increased DOE and SOB.  EMS was called and the pt had O2 sats of 70% on 100% NRB.  Pt was intubated in the ER and became hypotensive post procedure.  Details leading up to EMS being called were not available.  Prior to being intubated the pt was non verbal and not following commands per the ER staff.  It is unclear how long he had been unresponsive.    Past Medical History:  HTN Shingles Anxiety COPD Bipolar disorder Rhabdo NSTEMI CVA  Procedures:  TLC placement LP  Significant Diagnostic Tests:    Micro Data:  COVID + Influenza: neg  Antimicrobials:  Vanco  Cefepime   Objective   Blood pressure (!) 83/46, pulse 95, temperature (!) 100.4 F (38 C), resp. rate 18, height 5\' 6"  (1.676 m), weight 72.9 kg, SpO2 100 %.    Vent Mode: PRVC FiO2 (%):  [40 %-100 %] 40 % Set Rate:  [18 bmp] 18 bmp Vt Set:  [355 mL-510 mL] 355 mL PEEP:  [5 cmH20] 5 cmH20 Plateau Pressure:  [15 cmH20-17 cmH20] 17 cmH20  No intake or output data in the 24 hours ending 11/14/20 0354 Filed Weights   11/14/20 0023  Weight: 72.9 kg    Examination: General: No acute distress +cachexia HENT: orally intubated.  Mucus membranes are dry Lungs: fine crackles bilateral.  No wheezing or rhonchi Cardiovascular: RR no murmur or rub Abdomen: Soft non distended, +BS, no rebound rigidity or guarding but limited by neuro status Extremities: no edema, Distal pulses intact x4  No cyanosis, + thenar wasting Neuro: unconscious unresponsive, not following commands GU: foley cath intact   Assessment & Plan:  Acute resp failure COVID pna Septic Shock Intravascular volume  depletion Severe Hypernatremia Hypovolemic ALOC AKI   Plan: Admit to the ICU for further workup ARDS vent protocol started  68ml/kg BP did not tolerate propofol will use ketamine gtt since the pt is starting to wake up and working against the vent Titrate levophed for MAP>60 Add Vasopressin gtt Send procal and cortisol level stat Remdesivir and decadron started Vancomycin and cefepime while cultures are pending.  Since he has had a recent bacteremia 11/21 LP was completed for [facility]- labs pending- fluid clear with low opening pressure Monitor I/O- foley cath intact Serial BMP LR bolus and then infusion Follow inflammatory markers Serial neuro checks CT head now  Best practice (evaluated daily)  Diet: NPO Pain/Anxiety/Delirium protocol (if indicated):Ketamine gtt VAP protocol (if indicated): Started DVT prophylaxis: Lovenox GI prophylaxis: protonix Glucose control: Monitor BS Mobility: bedrest Disposition: admit to the ICU  Goals of Care:   Code Status: Full  Labs   CBC: Recent Labs  Lab 11/01/2020 2349 11/14/20 0157 11/14/20 0228  WBC 9.0  --   --   NEUTROABS 8.0*  --   --   HGB 11.9* 10.2*  9.9* 10.2*  HCT 42.0 30.0*  29.0* 30.0*  MCV 105.3*  --   --   PLT 125*  --   --     Basic Metabolic Panel: Recent Labs  Lab 11/09/2020 2349 11/14/20  0157 11/14/20 0228  NA 162* 164*  164* 163*  K 3.9 3.1*  3.1* 3.1*  CL 128* 130*  --   CO2 19*  --   --   GLUCOSE 196* 156*  --   BUN 81* 74*  --   CREATININE 2.93* 2.40*  --   CALCIUM 7.9*  --   --    GFR: Estimated Creatinine Clearance: 27 mL/min (A) (by C-G formula based on SCr of 2.4 mg/dL (H)). Recent Labs  Lab 2020/12/01 2349 11/14/20 0000  WBC 9.0  --   LATICACIDVEN 1.2 4.0*    Liver Function Tests: Recent Labs  Lab 12/01/20 2349  AST 32  ALT 18  ALKPHOS 35*  BILITOT 0.3  PROT 5.6*  ALBUMIN 2.2*   No results for input(s): LIPASE, AMYLASE in the last 168 hours. No results for input(s): AMMONIA  in the last 168 hours.  ABG    Component Value Date/Time   PHART 7.295 (L) 11/14/2020 0228   PCO2ART 42.9 11/14/2020 0228   PO2ART 291 (H) 11/14/2020 0228   HCO3 20.7 11/14/2020 0228   TCO2 22 11/14/2020 0228   ACIDBASEDEF 5.0 (H) 11/14/2020 0228   O2SAT 100.0 11/14/2020 0228     Coagulation Profile: Recent Labs  Lab Dec 01, 2020 2349  INR 1.4*    Cardiac Enzymes: Recent Labs  Lab 2020-12-01 2349  CKTOTAL 90    HbA1C: Hgb A1c MFr Bld  Date/Time Value Ref Range Status  09/01/2020 02:56 AM 6.0 (H) 4.8 - 5.6 % Final    Comment:    (NOTE) Pre diabetes:          5.7%-6.4%  Diabetes:              >6.4%  Glycemic control for   <7.0% adults with diabetes   01/16/2019 05:42 AM 6.6 (H) 4.8 - 5.6 % Final    Comment:    (NOTE) Pre diabetes:          5.7%-6.4% Diabetes:              >6.4% Glycemic control for   <7.0% adults with diabetes     CBG: No results for input(s): GLUCAP in the last 168 hours.  Review of Systems:   Unable to obtain  Past Medical History:  He,  has a past medical history of Anxiety, Hypertension, Shingles (2013), and Shoulder pain.   Surgical History:   Past Surgical History:  Procedure Laterality Date  . IR ANGIO VERTEBRAL SEL SUBCLAVIAN INNOMINATE UNI R MOD SED  01/16/2019  . IR CT HEAD LTD  01/16/2019  . IR INTRAVSC STENT CERV CAROTID W/O EMB-PROT MOD SED INC ANGIO  01/16/2019  . IR PERCUTANEOUS ART THROMBECTOMY/INFUSION INTRACRANIAL INC DIAG ANGIO  01/16/2019  . RADIOLOGY WITH ANESTHESIA N/A 01/15/2019   Procedure: IR WITH ANESTHESIA;  Surgeon: Julieanne Cotton, MD;  Location: [facility] OR;  Service: Radiology;  Laterality: N/A;     Social History:   reports that he has been smoking cigarettes. He has a 100.00 pack-year smoking history. He has never used smokeless tobacco. He reports that he does not drink alcohol and does not use drugs.   Family History:  His Family history is unknown by patient.   Allergies Allergies  Allergen Reactions   . Pollen Extract Other (See Comments)    Sneezing      Home Medications  Prior to Admission medications   Medication Sig Start Date End Date Taking? Authorizing Provider  aspirin 81 MG chewable tablet Chew 4  tablets (324 mg total) by mouth daily. 09/08/20   Ghimire, Werner Lean, MD  atorvastatin (LIPITOR) 40 MG tablet Take 1 tablet (40 mg total) by mouth daily. 01/18/19   Rinehuls, Kinnie Scales, PA-C  Deutetrabenazine (AUSTEDO) 12 MG TABS Take 18 mg by mouth in the morning and at bedtime. 09/07/20   Ghimire, Werner Lean, MD  divalproex (DEPAKOTE ER) 250 MG 24 hr tablet TAKE THREE TABLETS (750 MG TOTAL) BY MOUTH AT BEDTIME. 10/25/20   Toy Cookey E, NP  gabapentin (NEURONTIN) 300 MG capsule Take 1 capsule (300 mg total) by mouth 2 (two) times daily. 06/08/20   Shanna Cisco, NP  losartan (COZAAR) 50 MG tablet Take 1 tablet (50 mg total) by mouth daily. 06/01/19   Malvin Johns, MD  tamsulosin (FLOMAX) 0.4 MG CAPS capsule Take 2 capsules (0.8 mg total) by mouth daily. 09/07/20   Ghimire, Werner Lean, MD     Critical care time: 

## 2020-11-14 NOTE — Progress Notes (Addendum)
eLink Physician-Brief Progress Note Patient Name: Bryan Davies DOB: 03/31/1951 MRN: [id number]   Date of Service  11/14/2020  HPI/Events of Note  Notified of troponin result 1,529 to 954 to 1078 K 3.1, creatinine 1.5 improving Decreasing pressor requirement. 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

## 2020-11-14 NOTE — ED Notes (Signed)
Patient transported to floor with SWOT and RT

## 2020-11-14 NOTE — ED Notes (Signed)
Patient resting in bed, intubated and sedated.  MAP goal < 65 per MD  urine emptied from foley.   Patient placed in restraints for safety  MD aware of 101F temp  Unable to start tube feed in ED at this time, equipment not readily available, will attempt to order.  Per MD ok to give through OG and stop int. Suction.

## 2020-11-14 NOTE — ED Notes (Signed)
CBG 209. 

## 2020-11-14 NOTE — ED Provider Notes (Signed)
.  Lumbar Puncture  Date/Time: 11/14/2020 6:55 AM Performed by: Barkley Boards, PA-C Authorized by: Barkley Boards, PA-C   Consent:    Consent obtained:  Verbal and emergent situation   Risks, benefits, and alternatives were discussed: not applicable   Universal protocol:    Required blood products, implants, devices, and special equipment available: yes     Immediately prior to procedure a time out was called: yes     Site/side marked: yes     Patient identity confirmed:  Arm band Pre-procedure details:    Procedure purpose:  Diagnostic   Preparation: Patient was prepped and draped in usual sterile fashion   Anesthesia:    Anesthesia method:  Local infiltration   Local anesthetic:  Lidocaine 1% w/o epi Procedure details:    Lumbar space:  L4-L5 interspace   Patient position:  L lateral decubitus   Needle gauge:  22   Needle type:  Spinal needle - Quincke tip   Needle length (in):  5.0   Ultrasound guidance: no     Number of attempts:  5 or more Post-procedure details:    Puncture site:  Adhesive bandage applied   Procedure completion:  Procedure terminated electively by provider Comments:     After multiple attempts by both myself and Dr. Eudelia Bunch, LP was unsuccessful and procedure was terminated.       Barkley Boards, PA-C 11/14/20 5638    Nira Conn, MD 11/14/20 [phone]

## 2020-11-14 NOTE — ED Notes (Signed)
Patient transported to CT by this RN and RT 

## 2020-11-14 NOTE — Progress Notes (Signed)
Patient transported to CT and back to 013C without incidence.

## 2020-11-14 NOTE — Progress Notes (Signed)
Code Sepsis initiated @ 2349 PM, Elink following.

## 2020-11-14 NOTE — Progress Notes (Signed)
Pharmacy Antibiotic Note  Bryan Davies is a 69 y.o. male admitted on 14-Nov-2020 with VDRF, COVID+, possible sepsis/PNA.  Pharmacy has been consulted for Vancomycin dosing. Also on cefepime per MD. Vancomycin 1500 mg IV given in ED at 0200  Plan: Adjust vancomycin to 1500mg  IV q48h. Goal AUC 400-550. (estimated AUC 532, SCr used 2.38) Continue cefepime 2g IV q24h Monitor clinical progress, c/s, renal function F/u de-escalation plan/LOT, vancomycin levels as indicated   Height: 5\' 6"  (167.6 cm) Weight: 72.9 kg (160 lb 11.5 oz) IBW/kg (Calculated) : 63.8  Temp (24hrs), Avg:100.4 F (38 C), Min:99.6 F (37.6 C), Max:104.7 F (40.4 C)  Recent Labs  Lab 2020-11-14 2349 11/14/20 0000 11/14/20 0157 11/14/20 0420  WBC 9.0  --   --  11.0*  CREATININE 2.93*  --  2.40* 2.38*  2.47*  LATICACIDVEN 1.2 4.0*  --   --     Estimated Creatinine Clearance: 26.2 mL/min (A) (by C-G formula based on SCr of 2.47 mg/dL (H)).    Allergies  Allergen Reactions  . Pollen Extract Other (See Comments)    Sneezing     11/16/20, PharmD, BCPS Please check AMION for all [facility] contact numbers Clinical Pharmacist 11/14/2020 10:00 AM

## 2020-11-15 ENCOUNTER — Other Ambulatory Visit (HOSPITAL_COMMUNITY): Payer: Self-pay | Admitting: Psychiatry

## 2020-11-15 ENCOUNTER — Inpatient Hospital Stay (HOSPITAL_COMMUNITY): Payer: Medicare Other

## 2020-11-15 DIAGNOSIS — G9340 Encephalopathy, unspecified: Secondary | ICD-10-CM

## 2020-11-15 DIAGNOSIS — R6521 Severe sepsis with septic shock: Secondary | ICD-10-CM

## 2020-11-15 DIAGNOSIS — A419 Sepsis, unspecified organism: Secondary | ICD-10-CM | POA: Diagnosis not present

## 2020-11-15 DIAGNOSIS — N179 Acute kidney failure, unspecified: Secondary | ICD-10-CM | POA: Diagnosis not present

## 2020-11-15 DIAGNOSIS — U071 COVID-19: Secondary | ICD-10-CM | POA: Diagnosis not present

## 2020-11-15 DIAGNOSIS — E870 Hyperosmolality and hypernatremia: Secondary | ICD-10-CM | POA: Diagnosis not present

## 2020-11-15 LAB — CBC
HCT: 34.4 % — ABNORMAL LOW (ref 39.0–52.0)
Hemoglobin: 10.3 g/dL — ABNORMAL LOW (ref 13.0–17.0)
MCH: 29.7 pg (ref 26.0–34.0)
MCHC: 29.9 g/dL — ABNORMAL LOW (ref 30.0–36.0)
MCV: 99.1 fL (ref 80.0–100.0)
Platelets: 93 10*3/uL — ABNORMAL LOW (ref 150–400)
RBC: 3.47 MIL/uL — ABNORMAL LOW (ref 4.22–5.81)
RDW: 13.7 % (ref 11.5–15.5)
WBC: 7.9 10*3/uL (ref 4.0–10.5)
nRBC: 0.6 % — ABNORMAL HIGH (ref 0.0–0.2)

## 2020-11-15 LAB — COMPREHENSIVE METABOLIC PANEL
ALT: 25 U/L (ref 0–44)
AST: 47 U/L — ABNORMAL HIGH (ref 15–41)
Albumin: 2 g/dL — ABNORMAL LOW (ref 3.5–5.0)
Alkaline Phosphatase: 71 U/L (ref 38–126)
Anion gap: 11 (ref 5–15)
BUN: 68 mg/dL — ABNORMAL HIGH (ref 8–23)
CO2: 17 mmol/L — ABNORMAL LOW (ref 22–32)
Calcium: 7.8 mg/dL — ABNORMAL LOW (ref 8.9–10.3)
Chloride: 127 mmol/L — ABNORMAL HIGH (ref 98–111)
Creatinine, Ser: 1.21 mg/dL (ref 0.61–1.24)
GFR, Estimated: >60 mL/min (ref >60)
Glucose, Bld: 215 mg/dL — ABNORMAL HIGH (ref 70–99)
Potassium: 3.6 mmol/L (ref 3.5–5.1)
Sodium: 155 mmol/L — ABNORMAL HIGH (ref 135–145)
Total Bilirubin: 0.3 mg/dL (ref 0.3–1.2)
Total Protein: 5.3 g/dL — ABNORMAL LOW (ref 6.5–8.1)

## 2020-11-15 LAB — GLUCOSE, CAPILLARY
Glucose-Capillary: 108 mg/dL — ABNORMAL HIGH (ref 70–99)
Glucose-Capillary: 123 mg/dL — ABNORMAL HIGH (ref 70–99)
Glucose-Capillary: 126 mg/dL — ABNORMAL HIGH (ref 70–99)
Glucose-Capillary: 129 mg/dL — ABNORMAL HIGH (ref 70–99)
Glucose-Capillary: 140 mg/dL — ABNORMAL HIGH (ref 70–99)
Glucose-Capillary: 167 mg/dL — ABNORMAL HIGH (ref 70–99)
Glucose-Capillary: 189 mg/dL — ABNORMAL HIGH (ref 70–99)

## 2020-11-15 LAB — URINE CULTURE: Culture: NO GROWTH

## 2020-11-15 LAB — TROPONIN I (HIGH SENSITIVITY): Troponin I (High Sensitivity): 582 ng/L (ref <18)

## 2020-11-15 LAB — PHOSPHORUS: Phosphorus: 3.4 mg/dL (ref 2.5–4.6)

## 2020-11-15 LAB — PROCALCITONIN: Procalcitonin: 17.71 ng/mL

## 2020-11-15 LAB — MAGNESIUM: Magnesium: 2 mg/dL (ref 1.7–2.4)

## 2020-11-15 MED ORDER — VALPROATE SODIUM 100 MG/ML IV SOLN
250.0000 mg | Freq: Three times a day (TID) | INTRAVENOUS | Status: DC
  Administered 2020-11-15 – 2020-11-18 (×8): 250 mg via INTRAVENOUS
  Filled 2020-11-15 (×14): qty 2.5

## 2020-11-15 MED ORDER — DIVALPROEX SODIUM ER 500 MG PO TB24
750.0000 mg | ORAL_TABLET | Freq: Every day | ORAL | Status: DC
  Filled 2020-11-15: qty 1

## 2020-11-15 MED ORDER — METOPROLOL TARTRATE 5 MG/5ML IV SOLN
2.5000 mg | INTRAVENOUS | Status: DC | PRN
  Administered 2020-11-15: 2.5 mg via INTRAVENOUS
  Administered 2020-11-16: 5 mg via INTRAVENOUS
  Administered 2020-11-16 (×2): 2.5 mg via INTRAVENOUS
  Filled 2020-11-15 (×5): qty 5

## 2020-11-15 MED ORDER — SODIUM CHLORIDE 0.9 % IV SOLN: 2.0000 g | Freq: Two times a day (BID) | INTRAVENOUS | Status: DC

## 2020-11-15 MED ORDER — SODIUM CHLORIDE 0.9 % IV SOLN
INTRAVENOUS | Status: DC | PRN
  Administered 2020-11-15: 250 mL via INTRAVENOUS
  Administered 2020-11-16: 500 mL via INTRAVENOUS

## 2020-11-15 MED ORDER — SODIUM CHLORIDE 0.9 % IV SOLN
3.0000 g | Freq: Three times a day (TID) | INTRAVENOUS | Status: DC
  Administered 2020-11-15 – 2020-11-18 (×10): 3 g via INTRAVENOUS
  Filled 2020-11-15: qty 8
  Filled 2020-11-15 (×2): qty 3
  Filled 2020-11-15: qty 8
  Filled 2020-11-15: qty 3
  Filled 2020-11-15 (×7): qty 8

## 2020-11-15 MED ORDER — LACTATED RINGERS IV BOLUS
1000.0000 mL | Freq: Once | INTRAVENOUS | Status: AC
  Administered 2020-11-15: 1000 mL via INTRAVENOUS

## 2020-11-15 MED ORDER — SODIUM CHLORIDE 0.9% FLUSH
10.0000 mL | INTRAVENOUS | Status: DC | PRN
  Administered 2020-11-17: 10 mL

## 2020-11-15 MED ORDER — METOPROLOL TARTRATE 5 MG/5ML IV SOLN: 2.5000 mg | Freq: Once | INTRAVENOUS | Status: AC

## 2020-11-15 MED ORDER — METOPROLOL TARTRATE 5 MG/5ML IV SOLN
INTRAVENOUS | Status: AC
  Administered 2020-11-15: 2.5 mg
  Filled 2020-11-15: qty 5

## 2020-11-15 MED ORDER — METOPROLOL TARTRATE 5 MG/5ML IV SOLN
INTRAVENOUS | Status: AC
  Administered 2020-11-15: 5 mg
  Filled 2020-11-15: qty 5

## 2020-11-15 MED ORDER — ORAL CARE MOUTH RINSE: 15.0000 mL | Freq: Two times a day (BID) | OROMUCOSAL | Status: DC

## 2020-11-15 MED ORDER — SODIUM CHLORIDE 0.9% FLUSH
10.0000 mL | Freq: Two times a day (BID) | INTRAVENOUS | Status: DC
  Administered 2020-11-15: 10 mL
  Administered 2020-11-15: 30 mL
  Administered 2020-11-16 (×2): 10 mL

## 2020-11-15 MED ORDER — GABAPENTIN 300 MG PO CAPS
300.0000 mg | ORAL_CAPSULE | Freq: Two times a day (BID) | ORAL | Status: DC
  Administered 2020-11-15 – 2020-11-17 (×3): 300 mg via ORAL
  Filled 2020-11-15 (×3): qty 1

## 2020-11-15 MED ORDER — METOPROLOL TARTRATE 5 MG/5ML IV SOLN: 5.0000 mg | Freq: Once | INTRAVENOUS | Status: DC

## 2020-11-15 MED ORDER — ACETAMINOPHEN 650 MG RE SUPP
650.0000 mg | RECTAL | Status: DC | PRN
  Administered 2020-11-15 – 2020-11-16 (×2): 650 mg via RECTAL
  Filled 2020-11-15 (×2): qty 1

## 2020-11-15 MED ORDER — FAMOTIDINE IN NACL 20-0.9 MG/50ML-% IV SOLN
20.0000 mg | Freq: Two times a day (BID) | INTRAVENOUS | Status: DC
  Administered 2020-11-15 (×2): 20 mg via INTRAVENOUS
  Filled 2020-11-15 (×3): qty 50

## 2020-11-15 NOTE — Progress Notes (Signed)
eLink Physician-Brief Progress Note Patient Name: Bryan Davies DOB: 1951-08-11 MRN: [id number]   Date of Service  11/15/2020  HPI/Events of Note  Bedside RN reportedly was concerned patient was "tense" and needed something to help him relax, however on going into the room via camera patient is fast asleep.  eICU Interventions  No intervention at this time.        Thomasene Lot Ogan 11/15/2020, 10:18 PM

## 2020-11-15 NOTE — Progress Notes (Signed)
PCCM Communication Note  Updates provided to contact person Alcario Drought, password provided. All questions answered.   Tessie Fass MSN, AGACNP-BC [facility] Pulmonary/Critical Care Medicine 11/15/2020, 12:39 PM

## 2020-11-15 NOTE — Progress Notes (Signed)
Pt found having vigorous tremors with HR in 130s and elevated BP. Pt alert and oriented and able to converse. No c/o pain.  Pt has hx of TD and po Depakote ordered tonight. Notified Dr. Warrick Parisian who change PO route to IV q8  and ordered prn metoprolol.

## 2020-11-15 NOTE — Progress Notes (Signed)
Patient had a 24 beat SVT episode that resolved and is resting now. Will monitor closely and let CCM know. Tamsen Meek, RN 11/15/2020 6:21 PM

## 2020-11-15 NOTE — Progress Notes (Addendum)
NAME:  Bryan Davies, MRN:  [id number], DOB:  04/23/1951, LOS: 1 ADMISSION DATE:  11/07/2020, CONSULTATION DATE:  1/16 REFERRING MD:  Cardama, CHIEF COMPLAINT:  Dyspnea   Brief History:  70 y/o male presented from an assisted living facility on 1/17 with dyspnea, hypoxemia requiring intubation in the ER.  Found to have COVID pneumonia.  Past Medical History:  Anxiety Hypertension Shingles R MCA stroke CAD Bipoloar disorder toardive dyskinesia Hyperlipidemia Tobacco abuse  Significant Hospital Events:  1/16 presented to ER  Consults:    Procedures:  1/17 lumbar puncture > WBC 4   Significant Diagnostic Tests:  1/17 CT head > chronic R MCA infarct, NAICP  1/17 ECHO> LVEF 60-65%. Difficult to assess L endocardial border. Mildly reduced RV systolic function, RVSP 27.6. Mild aortic regurg + sclerosis, no evidence of stenosis.   Micro Data:  1/17 SARS COV 2  Positive, Flu negative 1/17 blood culture >  1/17 urine culture > no growth  Antimicrobials:   1/16 vanc > 1/17 1/16 cefepime > 1/17 1/16 flagyl x1 1/17 remdesivir >  Unasyn 1/18>  Interim History / Subjective:   WBC 8 PCT 17.71 Plt 93  Trops down-trending now 500s from 1000s  FiO2 40%, off NE  I switched to PSV/CPAP 5/5 -- doing well Sat upright in bed and helped pt with TV    Objective   Blood pressure (!) 109/55, pulse (!) 51, temperature 99.8 F (37.7 C), temperature source Axillary, resp. rate (!) 21, height 5\' 6"  (1.676 m), weight 72.9 kg, SpO2 100 %. CVP:  [3 mmHg-14 mmHg] 14 mmHg  Vent Mode: PRVC FiO2 (%):  [40 %-100 %] 50 % Set Rate:  [18 bmp] 18 bmp Vt Set:  [500 mL] 500 mL PEEP:  [5 cmH20] 5 cmH20 Plateau Pressure:  [8 cmH20-18 cmH20] 8 cmH20   Intake/Output Summary (Last 24 hours) at 11/15/2020 0723 Last data filed at 11/15/2020 0600 Gross per 24 hour  Intake 3651.71 ml  Output 1370 ml  Net 2281.71 ml   Filed Weights   11/14/20 0023  Weight: 72.9 kg    Examination: General:   Chronically and critically ill appearing older adult M, intubated lightly sedated NAD  HENT: NCAT. Yellow exudate from eyes. ETT secure. Pink dry mm  PULM:  CTAb, symmetrical chest expansion, no accessory use on PSV/CPAP.  CV: RRR s1s2 cap refill brisk GI:  Soft round ndnt  MSK: No obvious joint deformity no cyanosis. No pitting edema Derm: pale, c/d/w Neuro: lightly sedated. Awakens to voice, follows commands, PERRL. Continuous facial tremor    Resolved Hospital Problem list     Assessment & Plan:    Acute metabolic encephalopathy - in setting of hypoxemia, prior CVA, hypernatremia  -does not have meningitis P -hypoxemia as below -hypernatremia as below -Delirium precautions -supportive care  Bipolar 1 disorder Tardive dyskinesia  -depakote, gabapentin, q1mg  haldon injection -deutetrabenazine -VPA level < 10 on admission  -q30day hadol injection P -restart VPA  -- doubt that mild transaminitis and hyperammonemia are r/t VPA with very low VPA levels.  -restart gabapentin  Acute hypoxemic respiratory failure, likely aspiration in setting of poor airway protection- improving Tobacco use disorder COVID-19 infection  P -Follow resp cx -Cont unasyn  -Extubate -Remdesivir, steroids contact/airborne  -PRN duoneb   Shock, improving Sepsis + hypovolemia.  -coox and ECHO assuring against cardiogenic shock. CVP low, supporting suspicion of contributing hypovolemia NSTEMI ICM P -cont Unasyn, trend WBC, fever curve  -ASA   Bradycardia, mild  R/t precedex. Anticipate will resolve when precedex dc   AKI, improving -cont IVF -trend renal indices, UOP   Hypernatremia -suspect hypovolemic -cont IVF -- FWF will be dc when extubated, encourage PO intake when able   Hyperammonemia, mild Elevated AST, mild  Also noted on most recent admission, at that time VPA levels were also low, doubt related to VPA  -Trend PRN  -resume VPA as above   Hyperglycemia -SSI    Best  practice (evaluated daily)  Diet: start tube feeding Pain/Anxiety/Delirium protocol (if indicated): dc when extuvated VAP protocol (if indicated): yes DVT prophylaxis: lovenox to continue GI prophylaxis: Pantoprazole for stress ulcer prophylaxis Glucose control: SSI Mobility: bed rest -- PT/OT  Disposition:remain in ICU  Goals of Care:  Last date of multidisciplinary goals of care discussion: hasn't happened, intubated on arrival, girlfriend not answering phone when I called on 1/17 AM Family and staff present: n/a Summary of discussion: n/a Follow up goals of care discussion due: aby 1/24 Code Status: FULL  Labs   CBC: Recent Labs  Lab 10/30/2020 2349 11/14/20 0157 11/14/20 0228 11/14/20 0420 11/14/20 0618 11/15/20 0328  WBC 9.0  --   --  11.0*  --  7.9  NEUTROABS 8.0*  --   --   --   --   --   HGB 11.9* 10.2*  9.9* 10.2* 9.0* 9.5* 10.3*  HCT 42.0 30.0*  29.0* 30.0* 31.6* 28.0* 34.4*  MCV 105.3*  --   --  103.3*  --  99.1  PLT 125*  --   --  132*  --  93*    Basic Metabolic Panel: Recent Labs  Lab 11/04/2020 2349 11/14/20 0157 11/14/20 0228 11/14/20 0420 11/14/20 0618 11/14/20 1138 11/14/20 1400 11/14/20 1700 11/15/20 0328  NA 162* 164*  164* 163* 160* 160*  --  156*  --  155*  K 3.9 3.1*  3.1* 3.1* 3.5 3.3*  --  3.1*  --  3.6  CL 128* 130*  --  127*  --   --  125*  --  127*  CO2 19*  --   --  19*  --   --  19*  --  17*  GLUCOSE 196* 156*  --  236*  --   --  246*  --  215*  BUN 81* 74*  --  76*  --   --  68*  --  68*  CREATININE 2.93* 2.40*  --  2.38*  2.47*  --   --  1.49*  --  1.21  CALCIUM 7.9*  --   --  7.3*  --   --  7.5*  --  7.8*  MG  --   --   --  2.0  --  2.0  --  2.0 2.0  PHOS  --   --   --  4.6  --  3.6  --  3.1 3.4   GFR: Estimated Creatinine Clearance: 53.5 mL/min (by C-G formula based on SCr of 1.21 mg/dL). Recent Labs  Lab 11/12/2020 2349 11/14/20 0000 11/14/20 0420 11/14/20 1150 11/15/20 0328  PROCALCITON  --   --  14.84  --  17.71   WBC 9.0  --  11.0*  --  7.9  LATICACIDVEN 1.2 4.0*  --  1.5  --     Liver Function Tests: Recent Labs  Lab 11/24/2020 2349 11/14/20 1138 11/14/20 1139 11/15/20 0328  AST 32 33 34 47*  ALT 18 19 20 25   ALKPHOS 35* 32* 31*  71  BILITOT 0.3 0.4 0.5 0.3  PROT 5.6* 5.5* 5.7* 5.3*  ALBUMIN 2.2* 2.0* 2.1* 2.0*   No results for input(s): LIPASE, AMYLASE in the last 168 hours. Recent Labs  Lab 11/14/20 1151  AMMONIA 37*    ABG    Component Value Date/Time   PHART 7.357 11/14/2020 0618   PCO2ART 34.0 11/14/2020 0618   PO2ART 81 (L) 11/14/2020 0618   HCO3 19.1 (L) 11/14/2020 0618   TCO2 20 (L) 11/14/2020 0618   ACIDBASEDEF 6.0 (H) 11/14/2020 0618   O2SAT 97.9 11/14/2020 1101     Coagulation Profile: Recent Labs  Lab 11/16/2020 2349  INR 1.4*    Cardiac Enzymes: Recent Labs  Lab 11/22/2020 2349  CKTOTAL 90    HbA1C: Hgb A1c MFr Bld  Date/Time Value Ref Range Status  11/14/2020 11:38 AM 5.7 (H) 4.8 - 5.6 % Final    Comment:    (NOTE) Pre diabetes:          5.7%-6.4%  Diabetes:              >6.4%  Glycemic control for   <7.0% adults with diabetes   09/01/2020 02:56 AM 6.0 (H) 4.8 - 5.6 % Final    Comment:    (NOTE) Pre diabetes:          5.7%-6.4%  Diabetes:              >6.4%  Glycemic control for   <7.0% adults with diabetes     CBG: Recent Labs  Lab 11/14/20 1234 11/14/20 1636 11/14/20 1934 11/15/20 0028 11/15/20 0349  GLUCAP 222* 165* 140* 126* 189*     CRITICAL CARE Performed by: Lanier Clam   Total critical care time: 53 minutes  Critical care time was exclusive of separately billable procedures and treating other patients. Critical care was necessary to treat or prevent imminent or life-threatening deterioration.  Critical care was time spent personally by me on the following activities: development of treatment plan with patient and/or surrogate as well as nursing, discussions with consultants, evaluation of patient's response  to treatment, examination of patient, obtaining history from patient or surrogate, ordering and performing treatments and interventions, ordering and review of laboratory studies, ordering and review of radiographic studies, pulse oximetry and re-evaluation of patient's condition.  Tessie Fass MSN, AGACNP-BC [place] Pulmonary/Critical Care Medicine [phone] If no answer, [phone] 11/15/2020, 7:23 AM

## 2020-11-15 NOTE — Procedures (Signed)
Extubation Procedure Note  Patient Details:   Name: Bryan Davies DOB: Mar 10, 1951 MRN: [id number]   Airway Documentation:    Vent end date: 11/15/20 Vent end time: 1122   Evaluation  O2 sats: stable throughout Complications: No apparent complications Patient did tolerate procedure well. Bilateral Breath Sounds: Diminished,Clear   Yes   Patient extubated per MD order. Positive cuff leak. No stridor noted. Vitals are stable on 3L [place]. Patient has good strong cough. RN at bedside.  [name] H Rishan Oyama 11/15/2020, 11:22 AM

## 2020-11-15 NOTE — Progress Notes (Addendum)
Pharmacy Antibiotic Note  Bryan Davies is a 70 y.o. male admitted on 11/10/2020 with suspected aspiration pneumonia in setting of poor airway protection. Meningitis is not currently suspected. MRSA PCR negative with no risk factors for MDR pathogens.  Renal function improving.  Plan: D/C vancomycin and cefepime.  Start Unasyn (ampicillin/sulbactam) 3 g Q8hr. Monitor WBC via CBC, and renal fx via BMP daily.  Monitor cx results.   Height: 5\' 6"  (167.6 cm) Weight: 72.9 kg (160 lb 11.5 oz) IBW/kg (Calculated) : 63.8  Temp (24hrs), Avg:100.4 F (38 C), Min:99 F (37.2 C), Max:101.2 F (38.4 C)  Recent Labs  Lab 10/31/2020 2349 11/14/20 0000 11/14/20 0157 11/14/20 0420 11/14/20 1150 11/14/20 1400 11/15/20 0328  WBC 9.0  --   --  11.0*  --   --  7.9  CREATININE 2.93*  --  2.40* 2.38*  2.47*  --  1.49* 1.21  LATICACIDVEN 1.2 4.0*  --   --  1.5  --   --     Estimated Creatinine Clearance: 53.5 mL/min (by C-G formula based on SCr of 1.21 mg/dL).    Allergies  Allergen Reactions  . Pollen Extract Other (See Comments)    Sneezing     Antimicrobials this admission: vanc 1/17 >> 1/18 cefepime 1/17 >> 1/18    Microbiology results: 1/17 BCx: pending (ng) 1/17 UCx: pending (ng) 1/17 CSF: pending (ngtd) 1/17 Sputum: pending (ng) 1/17 MRSA PCR: negative 1/17 COVID positive  Thank you for allowing pharmacy to be a part of this patient's care.  2/17, PharmD Student 11/15/2020 9:03 AM

## 2020-11-15 NOTE — Progress Notes (Signed)
eLink Physician-Brief Progress Note Patient Name: Bryan Davies DOB: 03-09-1951 MRN: [id number]   Date of Service  11/15/2020  HPI/Events of Note  Patient unable to take his oral dose of Depakote due to current NPO status. He's having a flare up of his tardive dyskinesia, along with elevated BP and heart rate.  eICU Interventions  Oral Depakote discontinued. Depakote 250 mg iv Q 8 hours ordered, PRN iv Lopressor ordered for SBP > 160 and / or heart rate > 115.        Thomasene Lot Ogan 11/15/2020, 8:29 PM

## 2020-11-15 NOTE — Progress Notes (Signed)
Elink assisted sister with tele camera visit.

## 2020-11-16 ENCOUNTER — Inpatient Hospital Stay (HOSPITAL_COMMUNITY): Payer: Medicare Other

## 2020-11-16 LAB — CBC WITH DIFFERENTIAL/PLATELET
Abs Immature Granulocytes: 0.17 10*3/uL — ABNORMAL HIGH (ref 0.00–0.07)
Basophils Absolute: 0 10*3/uL (ref 0.0–0.1)
Basophils Relative: 0 %
Eosinophils Absolute: 0 10*3/uL (ref 0.0–0.5)
Eosinophils Relative: 0 %
HCT: 37.8 % — ABNORMAL LOW (ref 39.0–52.0)
Hemoglobin: 11.8 g/dL — ABNORMAL LOW (ref 13.0–17.0)
Immature Granulocytes: 2 %
Lymphocytes Relative: 4 %
Lymphs Abs: 0.4 10*3/uL — ABNORMAL LOW (ref 0.7–4.0)
MCH: 29.6 pg (ref 26.0–34.0)
MCHC: 31.2 g/dL (ref 30.0–36.0)
MCV: 95 fL (ref 80.0–100.0)
Monocytes Absolute: 0.2 10*3/uL (ref 0.1–1.0)
Monocytes Relative: 2 %
Neutro Abs: 9.7 10*3/uL — ABNORMAL HIGH (ref 1.7–7.7)
Neutrophils Relative %: 92 %
Platelets: 111 10*3/uL — ABNORMAL LOW (ref 150–400)
RBC: 3.98 MIL/uL — ABNORMAL LOW (ref 4.22–5.81)
RDW: 13.6 % (ref 11.5–15.5)
WBC: 10.5 10*3/uL (ref 4.0–10.5)
nRBC: 0.3 % — ABNORMAL HIGH (ref 0.0–0.2)

## 2020-11-16 LAB — BASIC METABOLIC PANEL
Anion gap: 11 (ref 5–15)
BUN: 61 mg/dL — ABNORMAL HIGH (ref 8–23)
CO2: 20 mmol/L — ABNORMAL LOW (ref 22–32)
Calcium: 8.2 mg/dL — ABNORMAL LOW (ref 8.9–10.3)
Chloride: 125 mmol/L — ABNORMAL HIGH (ref 98–111)
Creatinine, Ser: 1.2 mg/dL (ref 0.61–1.24)
GFR, Estimated: >60 mL/min (ref >60)
Glucose, Bld: 128 mg/dL — ABNORMAL HIGH (ref 70–99)
Potassium: 3.5 mmol/L (ref 3.5–5.1)
Sodium: 156 mmol/L — ABNORMAL HIGH (ref 135–145)

## 2020-11-16 LAB — PHOSPHORUS: Phosphorus: 2.3 mg/dL — ABNORMAL LOW (ref 2.5–4.6)

## 2020-11-16 LAB — PROCALCITONIN: Procalcitonin: 9.44 ng/mL

## 2020-11-16 LAB — MAGNESIUM
Magnesium: 2.2 mg/dL (ref 1.7–2.4)
Magnesium: 2.1 mg/dL (ref 1.7–2.4)

## 2020-11-16 LAB — GLUCOSE, CAPILLARY
Glucose-Capillary: 113 mg/dL — ABNORMAL HIGH (ref 70–99)
Glucose-Capillary: 101 mg/dL — ABNORMAL HIGH (ref 70–99)
Glucose-Capillary: 135 mg/dL — ABNORMAL HIGH (ref 70–99)
Glucose-Capillary: 141 mg/dL — ABNORMAL HIGH (ref 70–99)
Glucose-Capillary: 153 mg/dL — ABNORMAL HIGH (ref 70–99)

## 2020-11-16 MED ORDER — TAMSULOSIN HCL 0.4 MG PO CAPS
0.8000 mg | ORAL_CAPSULE | Freq: Every day | ORAL | Status: DC
  Administered 2020-11-17: 0.8 mg via ORAL
  Filled 2020-11-16 (×2): qty 2

## 2020-11-16 MED ORDER — SODIUM CHLORIDE 0.9 % IV BOLUS: 500.0000 mL | Freq: Once | INTRAVENOUS | Status: DC

## 2020-11-16 MED ORDER — PROSOURCE TF PO LIQD
45.0000 mL | Freq: Two times a day (BID) | ORAL | Status: DC
  Administered 2020-11-16 – 2020-11-18 (×4): 45 mL
  Filled 2020-11-16 (×5): qty 45

## 2020-11-16 MED ORDER — LORAZEPAM 2 MG/ML IJ SOLN
0.5000 mg | INTRAMUSCULAR | Status: DC | PRN
  Administered 2020-11-16 (×2): 0.5 mg via INTRAVENOUS
  Filled 2020-11-16 (×2): qty 1

## 2020-11-16 MED ORDER — DEXMEDETOMIDINE HCL IN NACL 400 MCG/100ML IV SOLN: 0.2000 ug/kg/h | INTRAVENOUS | Status: DC

## 2020-11-16 MED ORDER — FREE WATER
200.0000 mL | Status: DC
  Administered 2020-11-16 – 2020-11-17 (×4): 200 mL

## 2020-11-16 MED ORDER — METHYLPREDNISOLONE SODIUM SUCC 40 MG IJ SOLR
40.0000 mg | Freq: Every day | INTRAMUSCULAR | Status: DC
  Administered 2020-11-16 – 2020-11-18 (×3): 40 mg via INTRAVENOUS
  Filled 2020-11-16 (×4): qty 1

## 2020-11-16 MED ORDER — LACTATED RINGERS IV SOLN: INTRAVENOUS | Status: AC

## 2020-11-16 MED ORDER — ACETAMINOPHEN 160 MG/5ML PO SOLN
650.0000 mg | ORAL | Status: DC | PRN
  Administered 2020-11-17 – 2020-11-18 (×6): 650 mg
  Filled 2020-11-16 (×6): qty 20.3

## 2020-11-16 MED ORDER — DEXTROSE 5 % IV SOLN: INTRAVENOUS | Status: AC

## 2020-11-16 MED ORDER — BENZTROPINE MESYLATE 1 MG/ML IJ SOLN
1.0000 mg | Freq: Once | INTRAMUSCULAR | Status: DC
  Filled 2020-11-16: qty 1

## 2020-11-16 MED ORDER — VITAL 1.5 CAL PO LIQD
1000.0000 mL | ORAL | Status: DC
  Administered 2020-11-16 – 2020-11-18 (×3): 1000 mL
  Filled 2020-11-16 (×3): qty 1000

## 2020-11-16 MED ORDER — LACTATED RINGERS IV BOLUS
500.0000 mL | Freq: Once | INTRAVENOUS | Status: AC
  Administered 2020-11-16: 500 mL via INTRAVENOUS

## 2020-11-16 MED ORDER — VITAL 1.5 CAL PO LIQD: 1000.0000 mL | ORAL | Status: DC

## 2020-11-16 NOTE — Evaluation (Signed)
Physical Therapy Evaluation Patient Details Name: Bryan Davies MRN: [id number] DOB: 1951-02-16 Today's Date: 11/16/2020   History of Present Illness  Pt adm with Covid pna and intubated 1/17.  Extubated 1/18. PMH - Rt CVA, anxiety, HTN, bipolar, tardive dyskinesia.  Clinical Impression  Pt presents to PT lethargic and assist for any movement. Per review of chart pt has been at [facility] since hospitalization 08/2020. During that hospitalization pt was standing and transferring to a chair with 2 person min assist. Unsure of his mobility/activity at the SNF since then. His feet/ankles seem fairly fixed in inversion and plantarflexion which leads me to suspect he may not have been standing or ambulating recently. Will continue PT to maximize functional status but feel he will continue to need full care at the facility.    Follow Up Recommendations SNF    Equipment Recommendations  None recommended by PT    Recommendations for Other Services       Precautions / Restrictions Precautions Precautions: Fall      Mobility  Bed Mobility Overal bed mobility: Needs Assistance             General bed mobility comments: Bed placed in chair position. Total assist to bring trunk forward from support of bed into upright sitting    Transfers                 General transfer comment: Recommend mechanical lift at this time  Ambulation/Gait                Stairs            Wheelchair Mobility    Modified Rankin (Stroke Patients Only)       Balance Overall balance assessment: Needs assistance Sitting-balance support: Bilateral upper extremity supported Sitting balance-Leahy Scale: Zero Sitting balance - Comments: max assist to keep trunk forward in chair position in bed                                     Pertinent Vitals/Pain Pain Assessment: Faces Faces Pain Scale: Hurts little more Pain Location: Generalized with PROM Pain  Descriptors / Indicators: Grimacing    Home Living Family/patient expects to be discharged to:: Skilled nursing facility                 Additional Comments: Pt had been at [facility] since Nov 2021 hospitalization.    Prior Function           Comments: Pt unable to relate information. Has been at [facility] x 2 months. Unsure of functional level     Hand Dominance   Dominant Hand: Right    Extremity/Trunk Assessment   Upper Extremity Assessment Upper Extremity Assessment: RUE deficits/detail;LUE deficits/detail;Difficult to assess due to impaired cognition RUE Deficits / Details: Decr passive shoulder flexion/abduction, elbow extension. Pt did not actively move. LUE Deficits / Details: Decr passive shoulder flexion/abduction, elbow extension. Pt did not actively move.    Lower Extremity Assessment Lower Extremity Assessment: RLE deficits/detail;LLE deficits/detail;Difficult to assess due to impaired cognition RLE Deficits / Details: Foot ankle inverted and plantarflexed - unable to passively get into neutral. Pt did not actively move. LLE Deficits / Details: Foot ankle inverted and plantarflexed - unable to passively get into neutral. Pt did not actively move.       Communication   Communication: No difficulties  Cognition Arousal/Alertness:  Lethargic Behavior During Therapy: Flat affect Overall Cognitive Status: No family/caregiver present to determine baseline cognitive functioning                                 General Comments: Pt with eye opening only 10% of session. Unable to answer questions. Did not follow commands.      General Comments      Exercises     Assessment/Plan    PT Assessment Patient needs continued PT services  PT Problem List Decreased strength;Decreased range of motion;Decreased activity tolerance;Decreased balance;Decreased mobility;Decreased cognition       PT Treatment Interventions DME  instruction;Functional mobility training;Therapeutic activities;Therapeutic exercise;Balance training;Patient/family education;Neuromuscular re-education;Cognitive remediation    PT Goals (Current goals can be found in the Care Plan section)  Acute Rehab PT Goals Patient Stated Goal: Pt unable PT Goal Formulation: Patient unable to participate in goal setting Time For Goal Achievement: 11/30/20 Potential to Achieve Goals: Fair    Frequency Min 2X/week   Barriers to discharge        Co-evaluation               AM-PAC PT "6 Clicks" Mobility  Outcome Measure Help needed turning from your back to your side while in a flat bed without using bedrails?: Total Help needed moving from lying on your back to sitting on the side of a flat bed without using bedrails?: Total Help needed moving to and from a bed to a chair (including a wheelchair)?: Total Help needed standing up from a chair using your arms (e.g., wheelchair or bedside chair)?: Total Help needed to walk in hospital room?: Total Help needed climbing 3-5 steps with a railing? : Total 6 Click Score: 6    End of Session Equipment Utilized During Treatment: Oxygen Activity Tolerance: Patient limited by lethargy Patient left: in bed;with call bell/phone within reach;with bed alarm set (Bed in chair position) Nurse Communication: Mobility status PT Visit Diagnosis: Other abnormalities of gait and mobility (R26.89);Muscle weakness (generalized) (M62.81)    Time: 2119-4174 PT Time Calculation (min) (ACUTE ONLY): 13 min   Charges:   PT Evaluation $PT Eval Moderate Complexity: 1 Mod          [facility] PT Acute Rehabilitation Services Pager [phone] Office [phone]   Angelina Ok Uh [facility] 11/16/2020, 12:51 PM

## 2020-11-16 NOTE — Progress Notes (Signed)
Initial Nutrition Assessment  DOCUMENTATION CODES:   Not applicable  INTERVENTION:   Tube Feeding via Cortrak: Vital 1.5 at 55 ml/hr Begin TF at 20 ml/hr; titrate by 10 mL q 8 hours until goal rate of 55 ml/hr Pro-Source TF 45 mL BID Provides 2060 kcals, 111 g of protein and 1003 ml of free water  Add free water flush of 200 mL q 4 hours   NUTRITION DIAGNOSIS:   Inadequate oral intake related to dysphagia,acute illness as evidenced by NPO status.  GOAL:   Patient will meet greater than or equal to 90% of their needs  MONITOR:   Diet advancement,TF tolerance,Labs,Skin,Weight trends  REASON FOR ASSESSMENT:   Rounds    ASSESSMENT:   69 yo male admitted with acute respiratory failure with COVID 19 infection requiring intubation, septic shock, metabolic encephalopathy, tardive dyskinesia. PMH includes HTN, bipolar disorder, stroke   1/16 Admitted 1/18 Extubated  Pt lethargic, on room air NPO, per SLP pt not safe for PO diet Cortrak placed today  Unable to obtain diet and weight history from patient at this time  No weight loss per weight encounters  Hypernatremic; recommend free water flushes  Labs: sodium 156 (H), Creatinine wdl, BUN 61 Meds: colace, ss novolog, solumedrol, miralax  Diet Order:   Diet Order            Diet NPO time specified Except for: Ice Chips  Diet effective now                 EDUCATION NEEDS:   Not appropriate for education at this time  Skin:  Skin Assessment: Skin Integrity Issues: Skin Integrity Issues:: Stage I Stage I: sacrum, ankle  Last BM:  PTA  Height:   Ht Readings from Last 1 Encounters:  11/12/2020 5\' 6"  (1.676 m)    Weight:   Wt Readings from Last 1 Encounters:  11/14/20 72.9 kg    BMI:  Body mass index is 25.94 kg/m.  Estimated Nutritional Needs:   Kcal:  1825-2190 kcals  Protein:  110-135 g  Fluid:  >/= 2 L    11/16/20 MS, RDN, LDN, CNSC Registered Dietitian III Clinical  Nutrition RD Pager and On-Call Pager Number Located in [place]

## 2020-11-16 NOTE — Progress Notes (Signed)
eLink Physician-Brief Progress Note Patient Name: Bryan Davies DOB: 10-02-1951 MRN: [id number]   Date of Service  11/16/2020  HPI/Events of Note  Patient needs PRN pain medication order.  eICU Interventions  PRN Tylenol ordered to be given via NG tube.        Thomasene Lot Victorian Gunn 11/16/2020, 10:58 PM

## 2020-11-16 NOTE — Progress Notes (Signed)
eLink Physician-Brief Progress Note Patient Name: Bryan Davies DOB: Dec 17, 1950 MRN: [id number]   Date of Service  11/16/2020  HPI/Events of Note  Bladder scan + with > 1000 ml urine in the bladder.  eICU Interventions  Foley catheter ordered.        Thomasene Lot Shaina Gullatt 11/16/2020, 3:59 AM

## 2020-11-16 NOTE — Evaluation (Signed)
Clinical/Bedside Swallow Evaluation Patient Details  Name: Bryan Davies MRN: [id number] Date of Birth: February 17, 1951  Today's Date: 11/16/2020 Time: SLP Start Time (ACUTE ONLY): 1154 SLP Stop Time (ACUTE ONLY): 1206 SLP Time Calculation (min) (ACUTE ONLY): 12 min  Past Medical History:  Past Medical History:  Diagnosis Date  . Anxiety   . Hypertension   . Shingles 2013  . Shoulder pain    Past Surgical History:  Past Surgical History:  Procedure Laterality Date  . IR ANGIO VERTEBRAL SEL SUBCLAVIAN INNOMINATE UNI R MOD SED  01/16/2019  . IR CT HEAD LTD  01/16/2019  . IR INTRAVSC STENT CERV CAROTID W/O EMB-PROT MOD SED INC ANGIO  01/16/2019  . IR PERCUTANEOUS ART THROMBECTOMY/INFUSION INTRACRANIAL INC DIAG ANGIO  01/16/2019  . RADIOLOGY WITH ANESTHESIA N/A 01/15/2019   Procedure: IR WITH ANESTHESIA;  Surgeon: Julieanne Cotton, MD;  Location: [facility] OR;  Service: Radiology;  Laterality: N/A;   HPI:  70 y.o. male presented from ALF on 1/17 with dyspnea, hypoxemia requiring intubation in the ED, extubated 1/18.  Found to have COVID pna.  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   Assessment / Plan / Recommendation Clinical Impression  Pt lethargic, responsive to questions about basic needs with yes/no, kept eyes closed for duration of assessment.  Voice and cough wet/weak.  Pt accepted ice chips with active but prolonged mastication, achieves what appears to be a delayed swallow response.  Sips of water, after three trials, elicited weak cough.  Pt with poor effort and participation. Currently not safe to begin a PO diet.  Recommend NPO for now; allow ice chips after oral care to help maintain health of oral mucosa and maintain spontaneous swallowing. D/W RN. SLP will follow for readiness to resume an oral diet vs instrumental testing. SLP Visit Diagnosis: Dysphagia, oropharyngeal phase (R13.12)     Aspiration Risk  Mod aspiration risk    Diet Recommendation   NPO, allow ice chips  Medication Administration: Via alternative means    Other  Recommendations Oral Care Recommendations: Oral care prior to ice chip/H20;Oral care QID   Follow up Recommendations Other (comment) (tba)      Frequency and Duration min 2x/week  2 weeks       Prognosis Prognosis for Safe Diet Advancement: Good      Swallow Study   General HPI: 70 y.o. male presented from ALF on 1/17 with dyspnea, hypoxemia requiring intubation in the ED, extubated 1/18.  Found to have COVID pna.  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 Type of Study: Bedside Swallow Evaluation Previous Swallow Assessment: during november admission Diet Prior to this Study: NPO Temperature Spikes Noted: No Respiratory Status: Nasal cannula History of Recent Intubation: Yes Length of Intubations (days): 1 days Date extubated: 11/15/20 Behavior/Cognition: Lethargic/Drowsy Oral Cavity Assessment: Within Functional Limits Oral Care Completed by SLP: Recent completion by staff Self-Feeding Abilities: Total assist Patient Positioning: Upright in bed Baseline Vocal Quality: Low vocal intensity Volitional Cough: Weak;Wet Volitional Swallow: Able to elicit    Oral/Motor/Sensory Function Overall Oral Motor/Sensory Function: Generalized oral weakness   Ice Chips Ice chips: Within functional limits Presentation: Spoon   Thin Liquid Thin Liquid: Impaired Presentation: Cup Oral Phase Functional Implications: Oral holding Pharyngeal  Phase Impairments: Suspected delayed Swallow;Cough - Delayed    Nectar Thick  Nectar Thick Liquid: Not tested   Honey Thick Honey Thick Liquid: Not tested   Puree Puree: Not tested   Solid     Solid: Not tested      Blenda Mounts Laurice 11/16/2020,1:08 PM  Marchelle Folks L. Samson Frederic, MA CCC/SLP Acute  Rehabilitation Services Office number [phone] Pager [phone]

## 2020-11-16 NOTE — Plan of Care (Signed)
  Problem: Coping: Goal: Psychosocial and spiritual needs will be supported Outcome: Progressing   Problem: Respiratory: Goal: Will maintain a patent airway Outcome: Progressing Goal: Complications related to the disease process, condition or treatment will be avoided or minimized Outcome: Progressing   

## 2020-11-16 NOTE — Progress Notes (Signed)
PCCM Family communication  Updates provided to contact in chart, Erica. All questions answered.  She is interested in a video visit, I will pass along to pts RN  Tessie Fass MSN, AGACNP-BC [facility] Pulmonary/Critical Care Medicine 11/16/2020, 11:24 AM

## 2020-11-16 NOTE — Progress Notes (Signed)
eLink Physician-Brief Progress Note Patient Name: Bryan Davies DOB: 23-Aug-1951 MRN: [id number]   Date of Service  11/16/2020  HPI/Events of Note  Patient is tremulous.  eICU Interventions  Trial of Ativan 0.5 mg iv Q 4 hour PRN  Tremulousness.        Thomasene Lot Ogan 11/16/2020, 12:23 AM

## 2020-11-16 NOTE — Progress Notes (Addendum)
eLink Physician-Brief Progress Note Patient Name: Bryan Davies DOB: 07/01/1951 MRN: [id number]   Date of Service  11/16/2020  HPI/Events of Note  Patient had bladder catheterization with . 1000 ml of urine out, and became hypotensive.  eICU Interventions  LR 500 ml iv fluid bolus x 1 ordered.        Thomasene Lot Ogan 11/16/2020, 4:39 AM

## 2020-11-16 NOTE — Progress Notes (Signed)
eLink Physician-Brief Progress Note Patient Name: Bryan Davies DOB: [birth year] MRN: [id number]   Date of Service  11/16/2020  HPI/Events of Note  Patient needs a.m. labs.  eICU Interventions  Labs ordered.        Thomasene Lot Alba Perillo 11/16/2020, 2:38 AM

## 2020-11-16 NOTE — Progress Notes (Addendum)
NAME:  Bryan Davies, MRN:  [id number], DOB:  [birth year], LOS: 2 ADMISSION DATE:  11-20-20, CONSULTATION DATE:  1/16 REFERRING MD:  Cardama, CHIEF COMPLAINT:  Dyspnea   Brief History:  70 y/o male presented from an assisted living facility on 1/17 with dyspnea, hypoxemia requiring intubation in the ER.  Found to have COVID pneumonia.  Past Medical History:  Anxiety Hypertension Shingles R MCA stroke CAD Bipoloar disorder toardive dyskinesia Hyperlipidemia Tobacco abuse  Significant Hospital Events:  1/16 presented to ER  Consults:    Procedures:  1/17 lumbar puncture > WBC 4   Significant Diagnostic Tests:  1/17 CT head > chronic R MCA infarct, NAICP  1/17 ECHO> LVEF 60-65%. Difficult to assess L endocardial border. Mildly reduced RV systolic function, RVSP 27.6. Mild aortic regurg + sclerosis, no evidence of stenosis.   Micro Data:  1/17 SARS COV 2  Positive, Flu negative 1/17 blood culture >  1/17 urine culture > no growth  Antimicrobials:   1/16 vanc > 1/17 1/16 cefepime > 1/17 1/16 flagyl x1 1/17 remdesivir >  Unasyn 1/18>  Interim History / Subjective:  Extubated yesterday tolerating well TD sx about at baseline per pt and pt sig other, Erica  We do not have home TD med on formulary   Has not had swallow eval yet Na 155 this AM  Objective   Blood pressure 128/65, pulse 84, temperature 98.3 F (36.8 C), temperature source Oral, resp. rate (!) 23, height 5\' 6"  (1.676 m), weight 72.9 kg, SpO2 96 %. CVP:  [3 mmHg-5 mmHg] 3 mmHg  Vent Mode: PSV;CPAP FiO2 (%):  [40 %] 40 % Set Rate:  [18 bmp] 18 bmp Vt Set:  [500 mL] 500 mL PEEP:  [5 cmH20] 5 cmH20 Pressure Support:  [5 cmH20] 5 cmH20 Plateau Pressure:  [13 cmH20] 13 cmH20   Intake/Output Summary (Last 24 hours) at 11/16/2020 0745 Last data filed at 11/16/2020 0600 Gross per 24 hour  Intake 2818.25 ml  Output 2400 ml  Net 418.25 ml   Filed Weights   11/14/20 0023  Weight: 72.9 kg     Examination: General:  Chronically and acutely ill appearing older adult M, supine in bed NAD HENT: NCAT anicteric sclera pink mm PULM:  CTAb symmetrical chest expansion even unlabored resp on RA  CV: rrr s1s2 no rgm  GI:  Soft thin ndnt + bowel sounds  MSK: No obvious joint deformity no cyanosis or clubbing  Derm: pale c/d/w/i Neuro: Facial tremor, increased muscle tone     Resolved Hospital Problem list    Acute Hypoxic respiratory failure  Shock--coox and ECHO assuring against cardiogenic shock. CVP low, supporting suspicion of contributing hypovolemia. Component of sepsis  Bradycardia Assessment & Plan:    Acute metabolic encephalopathy, improving - in setting of hypoxemia, prior CVA, hypernatremia  -does not have meningitis P -hypernatremia as below -Delirium precautions -supportive care  Bipolar 1 disorder Tardive dyskinesia  -depakote, gabapentin, q69mo haldon injection -deutetrabenazine -VPA level < 10 on admission  -in d/w pt sig other + pt, TD sx about at baseline.  P -IV VPA  -- doubt that mild transaminitis and hyperammonemia are r/t VPA with very low VPA levels.  -restart gabapentin -IM cogentin -- we do not have home TD med on formulary  -PRN BZDs   Tobacco use disorder COVID-19 infection Sepsis due to CAP P -Cont unasyn  -Remdesivir, steroids contact/airborne  -PRN duoneb   NSTEMI ICM P -ASA -cardiac monitoring  Hypernatremia -suspect hypovolemic  P -D5 81ml/hr x 5 hours -awaiting swallow eval  Hyperammonemia, mild Elevated AST, mile Also noted on most recent admission, at that time VPA levels were also low, doubt related to VPA  P -Trend PRN  -contVPA as above   Hyperglycemia -SSI  Urinary retention, chronic -Flowmax ordered but not given as not yet taking POs -Placing foley 1/19  Inadequate PO intake -swallow eval limited by TD P -SLP eval -may need Cortrak  L/T/D P -dc art line 1/19 -place PIVs and dc CVC  1/19 -place foley 1/19 -pending swallow eval may need Cortrak  Best practice (evaluated daily)  Diet: NPO pending swallow eval Pain/Anxiety/Delirium protocol (if indicated): -- VAP protocol (if indicated): yes DVT prophylaxis: lovenox to continue GI prophylaxis:-- Glucose control: SSI Mobility: bed rest -- PT/OT  Disposition: stable for transfer out of ICU  Goals of Care:  Last date of multidisciplinary goals of care discussion: -- Family and staff present: n/a Summary of discussion: n/a Follow up goals of care discussion due: by 1/24 Family updated: 1/18 - spoke with emergency contact/partner Alcario Drought. Code Status: FULL  Labs   CBC: Recent Labs  Lab 11/23/2020 2349 11/14/20 0157 11/14/20 0228 11/14/20 0420 11/14/20 0618 11/15/20 0328 11/16/20 0322  WBC 9.0  --   --  11.0*  --  7.9 10.5  NEUTROABS 8.0*  --   --   --   --   --  9.7*  HGB 11.9*   < > 10.2* 9.0* 9.5* 10.3* 11.8*  HCT 42.0   < > 30.0* 31.6* 28.0* 34.4* 37.8*  MCV 105.3*  --   --  103.3*  --  99.1 95.0  PLT 125*  --   --  132*  --  93* 111*   < > = values in this interval not displayed.    Basic Metabolic Panel: Recent Labs  Lab 11/24/2020 2349 11/14/20 0157 11/14/20 0228 11/14/20 0420 11/14/20 0618 11/14/20 1138 11/14/20 1400 11/14/20 1700 11/15/20 0328 11/16/20 0322  NA 162* 164*  164*   < > 160* 160*  --  156*  --  155* 156*  K 3.9 3.1*  3.1*   < > 3.5 3.3*  --  3.1*  --  3.6 3.5  CL 128* 130*  --  127*  --   --  125*  --  127* 125*  CO2 19*  --   --  19*  --   --  19*  --  17* 20*  GLUCOSE 196* 156*  --  236*  --   --  246*  --  215* 128*  BUN 81* 74*  --  76*  --   --  68*  --  68* 61*  CREATININE 2.93* 2.40*  --  2.38*  2.47*  --   --  1.49*  --  1.21 1.20  CALCIUM 7.9*  --   --  7.3*  --   --  7.5*  --  7.8* 8.2*  MG  --   --   --  2.0  --  2.0  --  2.0 2.0 2.2  PHOS  --   --   --  4.6  --  3.6  --  3.1 3.4  --    < > = values in this interval not displayed.   GFR: Estimated  Creatinine Clearance: 53.9 mL/min (by C-G formula based on SCr of 1.2 mg/dL). Recent Labs  Lab 11/20/2020 2349 11/14/20 0000 11/14/20 0420 11/14/20 1150 11/15/20 0328 11/16/20 1941  PROCALCITON  --   --  14.84  --  17.71 9.44  WBC 9.0  --  11.0*  --  7.9 10.5  LATICACIDVEN 1.2 4.0*  --  1.5  --   --     Liver Function Tests: Recent Labs  Lab 11/18/2020 2349 11/14/20 1138 11/14/20 1139 11/15/20 0328  AST 32 33 34 47*  ALT 18 19 20 25   ALKPHOS 35* 32* 31* 71  BILITOT 0.3 0.4 0.5 0.3  PROT 5.6* 5.5* 5.7* 5.3*  ALBUMIN 2.2* 2.0* 2.1* 2.0*   No results for input(s): LIPASE, AMYLASE in the last 168 hours. Recent Labs  Lab 11/14/20 1151  AMMONIA 37*    ABG    Component Value Date/Time   PHART 7.357 11/14/2020 0618   PCO2ART 34.0 11/14/2020 0618   PO2ART 81 (L) 11/14/2020 0618   HCO3 19.1 (L) 11/14/2020 0618   TCO2 20 (L) 11/14/2020 0618   ACIDBASEDEF 6.0 (H) 11/14/2020 0618   O2SAT 97.9 11/14/2020 1101     Coagulation Profile: Recent Labs  Lab 11/01/2020 2349  INR 1.4*    Cardiac Enzymes: Recent Labs  Lab 11/07/2020 2349  CKTOTAL 90    HbA1C: Hgb A1c MFr Bld  Date/Time Value Ref Range Status  11/14/2020 11:38 AM 5.7 (H) 4.8 - 5.6 % Final    Comment:    (NOTE) Pre diabetes:          5.7%-6.4%  Diabetes:              >6.4%  Glycemic control for   <7.0% adults with diabetes   09/01/2020 02:56 AM 6.0 (H) 4.8 - 5.6 % Final    Comment:    (NOTE) Pre diabetes:          5.7%-6.4%  Diabetes:              >6.4%  Glycemic control for   <7.0% adults with diabetes     CBG: Recent Labs  Lab 11/15/20 1514 11/15/20 1958 11/15/20 2328 11/16/20 0325 11/16/20 0736  GLUCAP 108* 129* 123* 113* 101*     CRITICAL CARE Performed by: 11/18/20   CCT: n/a   Lanier Clam MSN, AGACNP-BC [facility] Pulmonary/Critical Care Medicine Tessie Fass If no answer, [phone] 11/16/2020, 9:22 AM

## 2020-11-16 NOTE — Progress Notes (Signed)
Assisted tele visit to patient with partner.  Bryan Halberg Anderson, RN   

## 2020-11-16 NOTE — Procedures (Signed)
Cortrak ° °Tube Type:  Cortrak - 43 inches °Tube Location:  Left nare °Initial Placement:  Stomach °Secured by: Bridle °Technique Used to Measure Tube Placement:  Documented cm marking at nare/ corner of mouth °Cortrak Secured At:  70 cm ° ° ° °Cortrak Tube Team Note: ° °Consult received to place a Cortrak feeding tube.  ° °X-ray is required, abdominal x-ray has been ordered by the Cortrak team. Please confirm tube placement before using the Cortrak tube.  ° °If the tube becomes dislodged please keep the tube and contact the Cortrak team at [email or web address] (password [email or web address]) for replacement.  °If after hours and replacement cannot be delayed, place a NG tube and confirm placement with an abdominal x-ray.  ° ° °Melbert Botelho MS, RD, LDN °Please refer to AMION for RD and/or RD on-call/weekend/after hours pager ° ° °

## 2020-11-17 ENCOUNTER — Inpatient Hospital Stay (HOSPITAL_COMMUNITY): Payer: Medicare Other

## 2020-11-17 LAB — BASIC METABOLIC PANEL
Anion gap: 10 (ref 5–15)
Anion gap: 10 (ref 5–15)
BUN: 45 mg/dL — ABNORMAL HIGH (ref 8–23)
BUN: 41 mg/dL — ABNORMAL HIGH (ref 8–23)
CO2: 27 mmol/L (ref 22–32)
CO2: 25 mmol/L (ref 22–32)
Calcium: 7.9 mg/dL — ABNORMAL LOW (ref 8.9–10.3)
Calcium: 7.7 mg/dL — ABNORMAL LOW (ref 8.9–10.3)
Chloride: 122 mmol/L — ABNORMAL HIGH (ref 98–111)
Chloride: 123 mmol/L — ABNORMAL HIGH (ref 98–111)
Creatinine, Ser: 0.9 mg/dL (ref 0.61–1.24)
Creatinine, Ser: 0.91 mg/dL (ref 0.61–1.24)
GFR, Estimated: >60 mL/min (ref >60)
GFR, Estimated: >60 mL/min (ref >60)
Glucose, Bld: 164 mg/dL — ABNORMAL HIGH (ref 70–99)
Glucose, Bld: 156 mg/dL — ABNORMAL HIGH (ref 70–99)
Potassium: 3.1 mmol/L — ABNORMAL LOW (ref 3.5–5.1)
Potassium: 3.2 mmol/L — ABNORMAL LOW (ref 3.5–5.1)
Sodium: 159 mmol/L — ABNORMAL HIGH (ref 135–145)
Sodium: 158 mmol/L — ABNORMAL HIGH (ref 135–145)

## 2020-11-17 LAB — CBC
HCT: 31.3 % — ABNORMAL LOW (ref 39.0–52.0)
Hemoglobin: 9.6 g/dL — ABNORMAL LOW (ref 13.0–17.0)
MCH: 29.5 pg (ref 26.0–34.0)
MCHC: 30.7 g/dL (ref 30.0–36.0)
MCV: 96.3 fL (ref 80.0–100.0)
Platelets: 86 10*3/uL — ABNORMAL LOW (ref 150–400)
RBC: 3.25 MIL/uL — ABNORMAL LOW (ref 4.22–5.81)
RDW: 13.6 % (ref 11.5–15.5)
WBC: 6.8 10*3/uL (ref 4.0–10.5)
nRBC: 0.3 % — ABNORMAL HIGH (ref 0.0–0.2)

## 2020-11-17 LAB — PHOSPHORUS
Phosphorus: 2.1 mg/dL — ABNORMAL LOW (ref 2.5–4.6)
Phosphorus: 2.2 mg/dL — ABNORMAL LOW (ref 2.5–4.6)

## 2020-11-17 LAB — GLUCOSE, CAPILLARY
Glucose-Capillary: 120 mg/dL — ABNORMAL HIGH (ref 70–99)
Glucose-Capillary: 142 mg/dL — ABNORMAL HIGH (ref 70–99)
Glucose-Capillary: 145 mg/dL — ABNORMAL HIGH (ref 70–99)
Glucose-Capillary: 195 mg/dL — ABNORMAL HIGH (ref 70–99)
Glucose-Capillary: 206 mg/dL — ABNORMAL HIGH (ref 70–99)
Glucose-Capillary: 229 mg/dL — ABNORMAL HIGH (ref 70–99)
Glucose-Capillary: 301 mg/dL — ABNORMAL HIGH (ref 70–99)

## 2020-11-17 LAB — C-REACTIVE PROTEIN: CRP: 0.9 mg/dL (ref <1.0)

## 2020-11-17 LAB — PROCALCITONIN: Procalcitonin: 3.41 ng/mL

## 2020-11-17 LAB — CSF CULTURE W GRAM STAIN: Culture: NO GROWTH

## 2020-11-17 LAB — MAGNESIUM
Magnesium: 2.1 mg/dL (ref 1.7–2.4)
Magnesium: 1.9 mg/dL (ref 1.7–2.4)

## 2020-11-17 LAB — MRSA PCR SCREENING: MRSA by PCR: NEGATIVE

## 2020-11-17 LAB — D-DIMER, QUANTITATIVE: D-Dimer, Quant: 1.52 ug/mL-FEU — ABNORMAL HIGH (ref 0.00–0.50)

## 2020-11-17 LAB — BRAIN NATRIURETIC PEPTIDE: B Natriuretic Peptide: 113.9 pg/mL — ABNORMAL HIGH (ref 0.0–100.0)

## 2020-11-17 MED ORDER — LACTATED RINGERS IV SOLN: INTRAVENOUS | Status: DC

## 2020-11-17 MED ORDER — ENOXAPARIN SODIUM 40 MG/0.4ML ~~LOC~~ SOLN
40.0000 mg | Freq: Two times a day (BID) | SUBCUTANEOUS | Status: DC
  Administered 2020-11-17 – 2020-11-18 (×2): 40 mg via SUBCUTANEOUS
  Filled 2020-11-17 (×2): qty 0.4

## 2020-11-17 MED ORDER — FREE WATER
200.0000 mL | Freq: Four times a day (QID) | Status: DC
  Administered 2020-11-17 – 2020-11-18 (×5): 200 mL

## 2020-11-17 MED ORDER — POTASSIUM CHLORIDE 2 MEQ/ML IV SOLN: INTRAVENOUS | Status: DC

## 2020-11-17 MED ORDER — ATORVASTATIN CALCIUM 40 MG PO TABS
40.0000 mg | ORAL_TABLET | Freq: Every day | ORAL | Status: DC
  Administered 2020-11-17 – 2020-11-18 (×2): 40 mg via NASOGASTRIC
  Filled 2020-11-17 (×2): qty 1

## 2020-11-17 MED ORDER — MORPHINE SULFATE (PF) 2 MG/ML IV SOLN
2.0000 mg | Freq: Once | INTRAVENOUS | Status: AC
  Administered 2020-11-17: 2 mg via INTRAVENOUS
  Filled 2020-11-17: qty 1

## 2020-11-17 MED ORDER — PANTOPRAZOLE SODIUM 40 MG IV SOLR
40.0000 mg | INTRAVENOUS | Status: DC
  Administered 2020-11-17 – 2020-11-18 (×2): 40 mg via INTRAVENOUS
  Filled 2020-11-17 (×2): qty 40

## 2020-11-17 MED ORDER — TICAGRELOR 90 MG PO TABS
90.0000 mg | ORAL_TABLET | Freq: Two times a day (BID) | ORAL | Status: DC
  Administered 2020-11-17 – 2020-11-18 (×3): 90 mg via NASOGASTRIC
  Filled 2020-11-17 (×3): qty 1

## 2020-11-17 MED ORDER — POTASSIUM CHLORIDE 2 MEQ/ML IV SOLN
INTRAVENOUS | Status: DC
  Filled 2020-11-17 (×5): qty 1000

## 2020-11-17 NOTE — NC FL2 (Signed)
[place] MEDICAID FL2 LEVEL OF CARE SCREENING TOOL     IDENTIFICATION  Patient Name: Bryan Davies Birthdate: 25-Jun-1951 Sex: male Admission Date (Current Location): 11-28-2020  [facility] and [place] Number:  Producer, television/film/video and Address:  [facility] [place], [address], [place], [place] [address]      Provider Number: [id number]  Attending Physician Name and Address:  Leroy Sea, MD  Relative Name and Phone Number:  brother Jonny Ruiz ( [phone])    Current Level of Care: Hospital Recommended Level of Care: Skilled Nursing Facility Prior Approval Number:    Date Approved/Denied:   PASRR Number: pending  Discharge Plan: SNF    Current Diagnoses: Patient Active Problem List   Diagnosis Date Noted  . Acute respiratory distress syndrome (ARDS) due to COVID-19 virus (HCC) 11/14/2020  . Pressure injury of skin 11/14/2020  . ARF (acute renal failure) (HCC) 09/01/2020  . Elevated troponin 09/01/2020  . Elevated LFTs 09/01/2020  . Non-ST elevation (NSTEMI) myocardial infarction (HCC)   . Non-traumatic rhabdomyolysis   . Left-sided weakness 08/31/2020  . Bipolar 1 disorder, mixed, moderate (HCC) 06/08/2020  . Tardive dyskinesia 06/08/2020  . Bipolar 1 disorder (HCC) 05/27/2019  . 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 Ischemic stroke (HCC) 01/15/2019  . Left shoulder pain 12/05/2015  . Urinary hesitancy 12/05/2015  . Pain in lower jaw 07/11/2015  . Hyperlipidemia 02/11/2014  . 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 Dyshydrosis 07/26/2013  . 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 Hypertension 05/21/2012  . Post herpetic neuralgia 05/21/2012  . Bipolar disorder (HCC) 05/21/2012  . Tobacco abuse 05/21/2012    Orientation RESPIRATION BLADDER Height & Weight     Self,Place  Normal Incontinent,Indwelling catheter Weight: 160 lb 11.5 oz (72.9 kg) Height:  5\' 6"  (167.6 cm)  BEHAVIORAL SYMPTOMS/MOOD NEUROLOGICAL BOWEL  NUTRITION STATUS      Continent Diet (Please see DC Summary)  AMBULATORY STATUS COMMUNICATION OF NEEDS Skin   Extensive Assist Verbally PU Stage and Appropriate Care (Stage I on ankle, sacrum; unstageable on heel; foam dressings)                       Personal Care Assistance Level of Assistance  Bathing,Feeding,Dressing Bathing Assistance: Maximum assistance Feeding assistance: Limited assistance Dressing Assistance: Limited assistance     Functional Limitations Info             SPECIAL CARE FACTORS FREQUENCY  PT (By licensed PT),OT (By licensed OT),Speech therapy     PT Frequency: 2x/week OT Frequency: 2x/week     Speech Therapy Frequency: 2x/week      Contractures Contractures Info: Not present    Additional Factors Info  Code Status,Allergies,Isolation Precautions,Insulin Sliding Scale Code Status Info: Full Allergies Info: Pollen Extract   Insulin Sliding Scale Info: See dc summary Isolation Precautions Info: COVID+     Current Medications (11/17/2020):  This is the current hospital active medication list Current Facility-Administered Medications  Medication Dose Route Frequency Provider Last Rate Last Admin  . 0.9 %  sodium chloride infusion   Intra-arterial PRN 11/26/2020 B, MD      . 0.9 %  sodium chloride infusion   Intravenous PRN Max Fickle, MD 10 mL/hr at 11/17/20 0104 Restarted at 11/17/20 0104  . acetaminophen (TYLENOL) 160 MG/5ML solution 650 mg  650 mg Per Tube Q4H PRN 11/16/2020, MD   650 mg at 11/17/20 0840  . acetaminophen (  TYLENOL) suppository 650 mg  650 mg Rectal Q4H PRN Cheri Fowler, MD   650 mg at 11/16/20 2144  . Ampicillin-Sulbactam (UNASYN) 3 g in sodium chloride 0.9 % 100 mL IVPB  3 g Intravenous Q8H Bowser, Grace E, NP 200 mL/hr at 11/17/20 0539 3 g at 11/17/20 0539  . aspirin chewable tablet 81 mg  81 mg Per Tube Daily Darl Pikes, MD   81 mg at 11/17/20 0841  . benztropine mesylate (COGENTIN) injection 1 mg  1  mg Intramuscular Once Bowser, Kaylyn Layer, NP      . Chlorhexidine Gluconate Cloth 2 % PADS 6 each  6 each Topical Daily Lupita Leash, MD   6 each at 11/16/20 [phone]  . docusate (COLACE) 50 MG/5ML liquid 100 mg  100 mg Per Tube BID PRN Kirtland Bouchard, MD      . docusate (COLACE) 50 MG/5ML liquid 100 mg  100 mg Per Tube BID Max Fickle B, MD   100 mg at 11/17/20 0840  . enoxaparin (LOVENOX) injection 40 mg  40 mg Subcutaneous Daily Norva Pavlov, RPH   40 mg at 11/17/20 0850  . feeding supplement (PROSource TF) liquid 45 mL  45 mL Per Tube BID Cheri Fowler, MD   45 mL at 11/17/20 0850  . feeding supplement (VITAL 1.5 CAL) liquid 1,000 mL  1,000 mL Per Tube Continuous Cheri Fowler, MD 30 mL/hr at 11/17/20 6644 Rate Change at 11/17/20 0637  . free water 200 mL  200 mL Per Tube Q4H Cheri Fowler, MD   200 mL at 11/17/20 0825  . gabapentin (NEURONTIN) capsule 300 mg  300 mg Oral BID Lanier Clam, NP   300 mg at 11/17/20 0841  . insulin aspart (novoLOG) injection 0-9 Units  0-9 Units Subcutaneous Q4H Lupita Leash, MD   1 Units at 11/17/20 0453  . ipratropium-albuterol (DUONEB) 0.5-2.5 (3) MG/3ML nebulizer solution 3 mL  3 mL Nebulization Q4H PRN Max Fickle B, MD      . LORazepam (ATIVAN) injection 0.5 mg  0.5 mg Intravenous Q4H PRN Migdalia Dk, MD   0.5 mg at 11/16/20 2150  . methylPREDNISolone sodium succinate (SOLU-MEDROL) 40 mg/mL injection 40 mg  40 mg Intravenous Daily Cheri Fowler, MD   40 mg at 11/17/20 0850  . metoprolol tartrate (LOPRESSOR) injection 2.5-5 mg  2.5-5 mg Intravenous Q3H PRN Migdalia Dk, MD   2.5 mg at 11/16/20 2154  . metoprolol tartrate (LOPRESSOR) injection 5 mg  5 mg Intravenous Once Cheri Fowler, MD      . ondansetron (ZOFRAN) injection 4 mg  4 mg Intravenous Q6H PRN Kirtland Bouchard, MD      . polyethylene glycol (MIRALAX / GLYCOLAX) packet 17 g  17 g Per Tube Daily PRN Kirtland Bouchard, MD      . polyethylene  glycol (MIRALAX / GLYCOLAX) packet 17 g  17 g Per Tube Daily Lupita Leash, MD   17 g at 11/17/20 0850  . remdesivir 100 mg in sodium chloride 0.9 % 100 mL IVPB  100 mg Intravenous Daily Kirtland Bouchard, MD   Stopped at 11/16/20 1313  . sodium chloride flush (NS) 0.9 % injection 10-40 mL  10-40 mL Intracatheter PRN Lupita Leash, MD   10 mL at 11/17/20 0444  . tamsulosin (FLOMAX) capsule 0.8 mg  0.8 mg Oral Daily Bowser, Grace E, NP   0.8 mg at 11/17/20 0840  . valproate (DEPACON) 250 mg in  dextrose 5 % 50 mL IVPB  250 mg Intravenous Q8H Migdalia Dk, MD 52.5 mL/hr at 11/17/20 0824 250 mg at 11/17/20 8546     Discharge Medications: Please see discharge summary for a list of discharge medications.  Relevant Imaging Results:  Relevant Lab Results:   Additional Information SS#: [id number]  Mearl Latin, LCSW

## 2020-11-17 NOTE — Progress Notes (Signed)
  Speech Language Pathology Treatment: Dysphagia  Patient Details Name: Bryan Davies MRN: [id number] DOB: 09-Dec-1950 Today's Date: 11/17/2020 Time: 2446-2863 SLP Time Calculation (min) (ACUTE ONLY): 13 min  Assessment / Plan / Recommendation Clinical Impression  Pt was seen for dysphagia treatment. Verbal output was limited, but a mildly wet vocal quality was demonstrated when pt spoke, suggesting reduced secretion management. Oral care was provided with suboptimal cooperation from pt. He tolerated ice chips without overt s/sx of aspiration. He exhibited increased WOB, increased RR to 32-38 and delayed coughs following trials of puree and thin liquids via tsp. Inhalation was also consistently demonstrated following the swallow, increasing aspiration risk. Pt does not present as a candidate for safe oral intake of a p.o. diet at this time. It is therefore recommended that his NPO status be maintained with medication and nutrition given via Cortrak. However, pt may continue to have ice chips following thorough oral care. SLP will continue to follow pt.    HPI HPI: 70 y.o. male presented from ALF on 1/17 with dyspnea, hypoxemia requiring intubation in the ED, extubated 1/18.  Found to have COVID pna.  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      SLP Plan  Continue with current plan of care       Recommendations  Diet recommendations: NPO (pt may have ice chips following oral care) Medication Administration: Via alternative means                Oral Care Recommendations: Oral care prior to ice chip/H20;Oral care QID Follow up Recommendations: Other (comment) (tba) SLP Visit Diagnosis: Dysphagia, oropharyngeal phase (R13.12) Plan: Continue with current plan of care       Bryan Davies I. Vear Clock, MS, CCC-SLP Acute Rehabilitation Services Office number [phone] Pager  [phone]                Bryan Davies 11/17/2020, 5:41 PM

## 2020-11-17 NOTE — Progress Notes (Addendum)
PROGRESS NOTE                                                                                                                                                                                                             Patient Demographics:    Bryan Davies, is a 70 y.o. male, DOB - 1951/04/24, QMV:[id number]  Outpatient Primary MD for the patient is [facility]    LOS - 3  Admit date - 07/24/21    CC - Fever     Brief Narrative (HPI from H&P)   70 y/o male with underlying history of tar dive dyskinesia, right MCA stroke, bipolar disorder, ongoing smoking, dyslipidemia, CAD on dual antiplatelet treatment, hypertension, who has been bedbound for few months and currently residing in an assisted living facility, presented from assisted living facility after being sick for a few days according to family and friends with fever, shortness of breath, in the ER diagnosed with acute hypoxic respiratory failure due to combination of COVID-19 pneumonia and aspiration pneumonia, sepsis, shock, required intubation and admission to the ICU.  He was stabilized extubated and transferred to hospitalist service on 11/17/2020.  Vaccination status is unknown.    Subjective:    Bryan Arnoldoger Harmening today in bed unable to answer questions or follow commands   Assessment  & Plan :      1. Acute Hypoxic Resp. Failure due to Acute Covid 19 Viral Pneumonitis along with aspiration pneumonia  - his immunization status is unknown, he was intubated and was in the ICU for the first few days, he was treated in ICU with IV steroids, Remdesivir along with Unasyn for aspiration pneumonia.  He has been extubated, hypoxia has improved.  Continue supportive care.  Overall quality of life poor and long-term prognosis is guarded.  Encouraged the patient to sit up in chair in the daytime use I-S and flutter valve for pulmonary toiletry and then prone in bed when at night.   Will advance activity and titrate down oxygen as possible.  ASpO2: 93 % O2 Flow Rate (L/min): 0 L/min FiO2 (%): 40 %  Recent Labs  Lab 11/26/20 2349 11/14/20 0000 11/14/20 0012 11/14/20 0157 11/14/20 0420 11/14/20 0618 11/14/20 1138 11/14/20 1139 11/14/20 1150 11/15/20 0328 11/16/20 0322 11/17/20 0421 11/17/20 0929  WBC 9.0  --   --   --  11.0*  --   --   --   --  7.9 10.5 6.8  --   HGB 11.9*  --   --    < > 9.0* 9.5*  --   --   --  10.3* 11.8* 9.6*  --   HCT 42.0  --   --    < > 31.6* 28.0*  --   --   --  34.4* 37.8* 31.3*  --   PLT 125*  --   --   --  132*  --   --   --   --  93* 111* 86*  --   BNP  --   --   --   --   --   --   --   --   --   --   --   --  113.9*  DDIMER  --   --   --   --   --   --   --   --   --   --   --   --  1.52*  PROCALCITON  --   --   --   --  14.84  --   --   --   --  17.71 9.44  --   --   AST 32  --   --   --   --   --  33 34  --  47*  --   --   --   ALT 18  --   --   --   --   --  19 20  --  25  --   --   --   ALKPHOS 35*  --   --   --   --   --  32* 31*  --  71  --   --   --   BILITOT 0.3  --   --   --   --   --  0.4 0.5  --  0.3  --   --   --   ALBUMIN 2.2*  --   --   --   --   --  2.0* 2.1*  --  2.0*  --   --   --   INR 1.4*  --   --   --   --   --   --   --   --   --   --   --   --   LATICACIDVEN 1.2 4.0*  --   --   --   --   --   --  1.5  --   --   --   --   SARSCOV2NAA  --   --  POSITIVE*  --   --   --   --   --   --   --   --   --   --    < > = values in this interval not displayed.    2. Severe metabolic encephalopathy - caused by sepsis & shock due to COVID-19 infection and aspiration pneumonia, head CT nonacute with evidence of old right MCA stroke, has underlying tar dive dyskinesia.  Neurontin, hold other sedating.  Unremarkable CSF on LP done in ICU.  Get EEG.  3. History of right MCA stroke.  Appears to be on dual antiplatelet treatment which will be continued via NG tube if possible, continue statin via NG tube for secondary  prevention as well.  It  appears that he has been bedbound for the last few months.  4.  Underlying tardive dyskinesia.  Continue Depakote IV, due to encephalopathy and dehydration hold Neurontin.  5. Severe dehydration with hypernatremia.  D5W IV and free water flushes via NG tube.  6. Moderately elevated D-dimer.  Likely due to inflammation from COVID-19 and dehydration.  Moderate dose Lovenox.  We will also follow DIC panel.  7. Non-ACS pattern stress induced demand mismatch causing troponin rise.  Does have underlying history of CAD.  For now continue dual antiplatelet and statin for secondary prevention, echo shows preserved EF without any noted wall motion abnormality, EKG nonacute.  Continue to monitor.      Condition - Extremely Guarded  Family Communication  :   Primary decision maker Brother John on 11/25/2020 phone #[phone] - DNR, gentle medical treatment, if declines focus on comfort.  significant other Alcario Droughtrica [phone] on 11/17/2020    Code Status :  DNR  Consults  :  PCCM  Procedures  :    1/17 lumbar puncture > WBC 4  Intubated 1/17 x 2 days  Right IJ central line placed in ICU  CT head.  Old right MCA stroke  TTE - 1. Left ventricular ejection fraction, by estimation, is 60 to 65%. The left ventricle has normal function. Left ventricular endocardial border not optimally defined to evaluate regional wall motion. Left ventricular diastolic parameters are indeterminate.  2. Right ventricular systolic function is mildly reduced. The right ventricular size is mildly enlarged. There is normal pulmonary artery systolic pressure. The estimated right ventricular systolic pressure is 27.6 mmHg.  3. The mitral valve is grossly normal. Trivial mitral valve regurgitation. No evidence of mitral stenosis.  4. The aortic valve is abnormal, but not well visualized. There is moderate calcification of the aortic valve. Aortic valve regurgitation is mild. Mild aortic valve sclerosis  is present, with no evidence of aortic valve stenosis.  5. The inferior vena cava is normal in size with greater than 50% respiratory variability, suggesting right atrial pressure of 3 mmHg    PUD Prophylaxis : PPI  Disposition Plan  :    Status is: Inpatient  Remains inpatient appropriate because:IV treatments appropriate due to intensity of illness or inability to take PO   Dispo: The patient is from: Home              Anticipated d/c is to: Home              Anticipated d/c date is: > 3 days              Patient currently is not medically stable to d/c.   DVT Prophylaxis  :  Lovenox   Lab Results  Component Value Date   PLT 86 (L) 11/17/2020    Diet :  Diet Order            Diet NPO time specified Except for: Ice Chips  Diet effective now                  Inpatient Medications  Scheduled Meds: . aspirin  81 mg Per Tube Daily  . atorvastatin  40 mg Per NG tube Daily  . benztropine mesylate  1 mg Intramuscular Once  . Chlorhexidine Gluconate Cloth  6 each Topical Daily  . docusate  100 mg Per Tube BID  . enoxaparin (LOVENOX) injection  40 mg Subcutaneous Q12H  . feeding supplement (PROSource TF)  45 mL Per Tube BID  .  free water  200 mL Per Tube Q6H  . insulin aspart  0-9 Units Subcutaneous Q4H  . methylPREDNISolone (SOLU-MEDROL) injection  40 mg Intravenous Daily  . metoprolol tartrate  5 mg Intravenous Once  . pantoprazole (PROTONIX) IV  40 mg Intravenous Q24H  . polyethylene glycol  17 g Per Tube Daily  . tamsulosin  0.8 mg Oral Daily  . ticagrelor  90 mg Per NG tube BID   Continuous Infusions: . sodium chloride    . sodium chloride 10 mL/hr at 11/17/20 0104  . ampicillin-sulbactam (UNASYN) IV 3 g (11/17/20 0539)  . dextrose 5 % with KCl/Additives Pediatric custom IV fluid    . feeding supplement (VITAL 1.5 CAL) 30 mL/hr at 11/17/20 0637  . remdesivir 100 mg in NS 100 mL 100 mg (11/17/20 1025)  . valproate sodium 250 mg (11/17/20 0824)   PRN  Meds:.Place/Maintain arterial line **AND** sodium chloride, sodium chloride, acetaminophen (TYLENOL) oral liquid 160 mg/5 mL, acetaminophen, docusate, ipratropium-albuterol, metoprolol tartrate, ondansetron (ZOFRAN) IV, sodium chloride flush  Antibiotics  :    Anti-infectives (From admission, onward)   Start     Dose/Rate Route Frequency Ordered Stop   11/16/20 0600  vancomycin (VANCOCIN) IVPB 1000 mg/200 mL premix  Status:  Discontinued        1,000 mg 200 mL/hr over 60 Minutes Intravenous Every 48 hours 11/14/20 0449 11/14/20 0959   11/16/20 0200  vancomycin (VANCOREADY) IVPB 1500 mg/300 mL  Status:  Discontinued        1,500 mg 150 mL/hr over 120 Minutes Intravenous Every 48 hours 11/14/20 0959 11/15/20 0903   11/15/20 1000  remdesivir 100 mg in sodium chloride 0.9 % 100 mL IVPB       "Followed by" Linked Group Details   100 mg 200 mL/hr over 30 Minutes Intravenous Daily 11/14/20 0409 11/18/2020 0959   11/15/20 1000  ceFEPIme (MAXIPIME) 2 g in sodium chloride 0.9 % 100 mL IVPB  Status:  Discontinued        2 g 200 mL/hr over 30 Minutes Intravenous Every 12 hours 11/15/20 0746 11/15/20 0903   11/15/20 1000  Ampicillin-Sulbactam (UNASYN) 3 g in sodium chloride 0.9 % 100 mL IVPB        3 g 200 mL/hr over 30 Minutes Intravenous Every 8 hours 11/15/20 0903 11/21/20 0559   11/14/20 2200  ceFEPIme (MAXIPIME) 2 g in sodium chloride 0.9 % 100 mL IVPB  Status:  Discontinued        2 g 200 mL/hr over 30 Minutes Intravenous Daily at bedtime 11/14/20 0417 11/15/20 0746   11/14/20 0430  remdesivir 200 mg in sodium chloride 0.9% 250 mL IVPB       "Followed by" Linked Group Details   200 mg 580 mL/hr over 30 Minutes Intravenous Once 11/14/20 0409 11/14/20 0854   11/14/20 0030  vancomycin (VANCOREADY) IVPB 1500 mg/300 mL        1,500 mg 150 mL/hr over 120 Minutes Intravenous  Once 12-01-2020 2353 11/14/20 0423   11/14/20 0000  ceFEPIme (MAXIPIME) 2 g in sodium chloride 0.9 % 100 mL IVPB        2  g 200 mL/hr over 30 Minutes Intravenous  Once 01-Dec-2020 2350 11/14/20 0154   11/14/20 0000  metroNIDAZOLE (FLAGYL) IVPB 500 mg        500 mg 100 mL/hr over 60 Minutes Intravenous  Once 12/01/20 2350 11/14/20 0423   11/14/20 0000  vancomycin (VANCOCIN) IVPB 1000 mg/200 mL premix  Status:  Discontinued        1,000 mg 200 mL/hr over 60 Minutes Intravenous  Once 11/12/2020 2350 11/01/2020 2353       Time Spent in minutes  30   Susa Raring M.D on 11/17/2020 at 11:56 AM  To page go to [email or web address]   [facility] -  Office  [phone]    See all Orders from today for further details    Objective:   Vitals:   11/17/20 0544 11/17/20 0930 11/17/20 1016 11/17/20 1017  BP:  119/66  (!) 113/52  Pulse:  92 91 92  Resp:  (!) 32  (!) 27  Temp:  99.8 F (37.7 C)  (!) 101 F (38.3 C)  TempSrc:  Axillary  Axillary  SpO2: 95% 94%  93%  Weight:      Height:        Wt Readings from Last 3 Encounters:  11/14/20 72.9 kg  09/04/20 72.9 kg  08/10/20 67.1 kg     Intake/Output Summary (Last 24 hours) at 11/17/2020 1156 Last data filed at 11/17/2020 0456 Gross per 24 hour  Intake 1852.53 ml  Output 2600 ml  Net -747.47 ml     Physical Exam  Awake but severely confused, unable to answer questions or follow commands, moving all 4 extremities by himself, NG tube in place, appears dehydrated Rosa.AT,PERRAL Supple Neck,No JVD, No cervical lymphadenopathy appriciated.  Symmetrical Chest wall movement, Good air movement bilaterally, CTAB RRR,No Gallops,Rubs or new Murmurs, No Parasternal Heave +ve B.Sounds, Abd Soft, No tenderness, No organomegaly appriciated, No rebound - guarding or rigidity. Left elbow pressure ulcer noted under bandage,    Data Review:    CBC Recent Labs  Lab 11/05/2020 2349 11/14/20 0157 11/14/20 0420 11/14/20 0618 11/15/20 0328 11/16/20 0322 11/17/20 0421  WBC 9.0  --  11.0*  --  7.9 10.5 6.8  HGB 11.9*   < > 9.0* 9.5* 10.3* 11.8* 9.6*  HCT 42.0    < > 31.6* 28.0* 34.4* 37.8* 31.3*  PLT 125*  --  132*  --  93* 111* 86*  MCV 105.3*  --  103.3*  --  99.1 95.0 96.3  MCH 29.8  --  29.4  --  29.7 29.6 29.5  MCHC 28.3*  --  28.5*  --  29.9* 31.2 30.7  RDW 13.9  --  14.2  --  13.7 13.6 13.6  LYMPHSABS 0.5*  --   --   --   --  0.4*  --   MONOABS 0.4  --   --   --   --  0.2  --   EOSABS 0.0  --   --   --   --  0.0  --   BASOSABS 0.0  --   --   --   --  0.0  --    < > = values in this interval not displayed.    Recent Labs  Lab 11/22/2020 2349 11/14/20 0000 11/14/20 0157 11/14/20 0420 11/14/20 0618 11/14/20 1138 11/14/20 1139 11/14/20 1150 11/14/20 1151 11/14/20 1400 11/14/20 1700 11/15/20 0328 11/16/20 0322 11/16/20 2057 11/17/20 0421 11/17/20 0929  NA 162*  --    < > 160*   < >  --   --   --   --  156*  --  155* 156*  --  159* 158*  K 3.9  --    < > 3.5   < >  --   --   --   --  3.1*  --  3.6 3.5  --  3.1* 3.2*  CL 128*  --    < > 127*  --   --   --   --   --  125*  --  127* 125*  --  122* 123*  CO2 19*  --   --  19*  --   --   --   --   --  19*  --  17* 20*  --  27 25  GLUCOSE 196*  --    < > 236*  --   --   --   --   --  246*  --  215* 128*  --  164* 156*  BUN 81*  --    < > 76*  --   --   --   --   --  68*  --  68* 61*  --  45* 41*  CREATININE 2.93*  --    < > 2.38*  2.47*  --   --   --   --   --  1.49*  --  1.21 1.20  --  0.90 0.91  CALCIUM 7.9*  --   --  7.3*  --   --   --   --   --  7.5*  --  7.8* 8.2*  --  7.9* 7.7*  AST 32  --   --   --   --  33 34  --   --   --   --  47*  --   --   --   --   ALT 18  --   --   --   --  19 20  --   --   --   --  25  --   --   --   --   ALKPHOS 35*  --   --   --   --  32* 31*  --   --   --   --  71  --   --   --   --   BILITOT 0.3  --   --   --   --  0.4 0.5  --   --   --   --  0.3  --   --   --   --   ALBUMIN 2.2*  --   --   --   --  2.0* 2.1*  --   --   --   --  2.0*  --   --   --   --   MG  --   --    < > 2.0  --  2.0  --   --   --   --  2.0 2.0 2.2 2.1 2.1  --   DDIMER  --   --    --   --   --   --   --   --   --   --   --   --   --   --   --  1.52*  PROCALCITON  --   --   --  14.84  --   --   --   --   --   --   --  17.71 9.44  --   --   --   LATICACIDVEN 1.2 4.0*  --   --   --   --   --  1.5  --   --   --   --   --   --   --   --  INR 1.4*  --   --   --   --   --   --   --   --   --   --   --   --   --   --   --   HGBA1C  --   --   --   --   --  5.7*  --   --   --   --   --   --   --   --   --   --   AMMONIA  --   --   --   --   --   --   --   --  37*  --   --   --   --   --   --   --   BNP  --   --   --   --   --   --   --   --   --   --   --   --   --   --   --  113.9*   < > = values in this interval not displayed.    ------------------------------------------------------------------------------------------------------------------ No results for input(s): CHOL, HDL, LDLCALC, TRIG, CHOLHDL, LDLDIRECT in the last 72 hours.  Lab Results  Component Value Date   HGBA1C 5.7 (H) 11/14/2020   ------------------------------------------------------------------------------------------------------------------ No results for input(s): TSH, T4TOTAL, T3FREE, THYROIDAB in the last 72 hours.  Invalid input(s): FREET3  Cardiac Enzymes No results for input(s): CKMB, TROPONINI, MYOGLOBIN in the last 168 hours.  Invalid input(s): CK ------------------------------------------------------------------------------------------------------------------    Component Value Date/Time   BNP 113.9 (H) 11/17/2020 1610    Micro Results Recent Results (from the past 240 hour(s))  Blood Culture (routine x 2)     Status: None (Preliminary result)   Collection Time: 11/14/20 12:00 AM   Specimen: BLOOD  Result Value Ref Range Status   Specimen Description BLOOD RIGHT ANTECUBITAL  Final   Special Requests   Final    BOTTLES DRAWN AEROBIC AND ANAEROBIC Blood Culture results may not be optimal due to an inadequate volume of blood received in culture bottles   Culture   Final    NO GROWTH  3 DAYS Performed at [facility], [address], [place], [place] [address]    Report Status PENDING  Incomplete  Blood Culture (routine x 2)     Status: None (Preliminary result)   Collection Time: 11/14/20 12:00 AM   Specimen: BLOOD LEFT ARM  Result Value Ref Range Status   Specimen Description BLOOD LEFT ARM  Final   Special Requests   Final    BOTTLES DRAWN AEROBIC AND ANAEROBIC Blood Culture results may not be optimal due to an inadequate volume of blood received in culture bottles   Culture   Final    NO GROWTH 3 DAYS Performed at [facility], [address], [place], [place] [address]    Report Status PENDING  Incomplete  Resp Panel by RT-PCR (Flu A&B, Covid) Nasopharyngeal Swab     Status: Abnormal   Collection Time: 11/14/20 12:12 AM   Specimen: Nasopharyngeal Swab; Nasopharyngeal(NP) swabs in vial transport medium  Result Value Ref Range Status   SARS Coronavirus 2 by RT PCR POSITIVE (A) NEGATIVE Final    Comment: RESULT CALLED TO, READ BACK BY AND VERIFIED WITH: G. TATE,RN 0116 11/14/2020 T. TYSOR (NOTE) SARS-CoV-2 target nucleic acids are DETECTED.  The SARS-CoV-2 RNA is generally  detectable in upper respiratory specimens during the acute phase of infection. Positive results are indicative of the presence of the identified virus, but do not rule out bacterial infection or co-infection with other pathogens not detected by the test. Clinical correlation with patient history and other diagnostic information is necessary to determine patient infection status. The expected result is Negative.  Fact Sheet for Patients: BloggerCourse.com  Fact Sheet for Healthcare Providers: SeriousBroker.it  This test is not yet approved or cleared by the [place] FDA and  has been authorized for detection and/or diagnosis of SARS-CoV-2 by FDA under an Emergency Use Authorization (EUA).  This EUA will remain in effect  (meaning this test can  be used) for the duration of  the COVID-19 declaration under Section 564(b)(1) of the Act, 21 U.S.C. section 360bbb-3(b)(1), unless the authorization is terminated or revoked sooner.     Influenza A by PCR NEGATIVE NEGATIVE Final   Influenza B by PCR NEGATIVE NEGATIVE Final    Comment: (NOTE) The Xpert Xpress SARS-CoV-2/FLU/RSV plus assay is intended as an aid in the diagnosis of influenza from Nasopharyngeal swab specimens and should not be used as a sole basis for treatment. Nasal washings and aspirates are unacceptable for Xpert Xpress SARS-CoV-2/FLU/RSV testing.  Fact Sheet for Patients: BloggerCourse.com  Fact Sheet for Healthcare Providers: SeriousBroker.it  This test is not yet approved or cleared by the [place] FDA and has been authorized for detection and/or diagnosis of SARS-CoV-2 by FDA under an Emergency Use Authorization (EUA). This EUA will remain in effect (meaning this test can be used) for the duration of the COVID-19 declaration under Section 564(b)(1) of the Act, 21 U.S.C. section 360bbb-3(b)(1), unless the authorization is terminated or revoked.  Performed at [facility], [address], [place], [place] [address]   Urine culture     Status: None   Collection Time: 11/14/20 12:44 AM   Specimen: Urine, Catheterized  Result Value Ref Range Status   Specimen Description URINE, CATHETERIZED  Final   Special Requests NONE  Final   Culture   Final    NO GROWTH Performed at [facility], [address] [place], [place] [address]    Report Status 11/15/2020 FINAL  Final  CSF culture     Status: None   Collection Time: 11/14/20  3:45 AM   Specimen: CSF; Cerebrospinal Fluid  Result Value Ref Range Status   Specimen Description CSF  Final   Special Requests TUBE 2  Final   Gram Stain   Final    CYTOSPIN SMEAR WBC PRESENT, PREDOMINANTLY MONONUCLEAR NO ORGANISMS  SEEN    Culture   Final    NO GROWTH 3 DAYS Performed at [facility], [address], [place], [place] [address]    Report Status 11/17/2020 FINAL  Final  MRSA PCR Screening     Status: None   Collection Time: 11/14/20  5:01 PM   Specimen: Nasopharyngeal  Result Value Ref Range Status   MRSA by PCR NEGATIVE NEGATIVE Final    Comment:        The GeneXpert MRSA Assay (FDA approved for NASAL specimens only), is one component of a comprehensive MRSA colonization surveillance program. It is not intended to diagnose MRSA infection nor to guide or monitor treatment for MRSA infections. Performed at [facility], [address] [place], [place] [address]   MRSA PCR Screening     Status: None   Collection Time: 11/17/20  7:42  AM   Specimen: Nasopharyngeal  Result Value Ref Range Status   MRSA by PCR NEGATIVE NEGATIVE Final    Comment:        The GeneXpert MRSA Assay (FDA approved for NASAL specimens only), is one component of a comprehensive MRSA colonization surveillance program. It is not intended to diagnose MRSA infection nor to guide or monitor treatment for MRSA infections. Performed at [facility], [address], [place], [place] [address]   Culture, blood (routine x 2)     Status: None (Preliminary result)   Collection Time: 11/17/20  9:42 AM   Specimen: BLOOD  Result Value Ref Range Status   Specimen Description BLOOD LEFT ANTECUBITAL  Final   Special Requests   Final    BOTTLES DRAWN AEROBIC AND ANAEROBIC Blood Culture adequate volume Performed at [facility], [address] [place], [place] [address]    Culture PENDING  Incomplete   Report Status PENDING  Incomplete    Radiology Reports CT HEAD WO CONTRAST  Result Date: 11/14/2020 CLINICAL DATA:  70 year old male positive COVID-19. Intubated. Altered mental status. EXAM: CT HEAD WITHOUT CONTRAST TECHNIQUE: Contiguous axial images were obtained from the base of the skull through  the vertex without intravenous contrast. COMPARISON:  Brain MRI 09/02/2020.  Head CT 10/08/2019. and earlier. FINDINGS: Brain: Chronic encephalomalacia right operculum and insula. Some generalized cerebral volume loss since 2020. Gray-white matter differentiation elsewhere within normal limits. No midline shift, ventriculomegaly, mass effect, evidence of mass lesion, intracranial hemorrhage or evidence of cortically based acute infarction. Vascular: Calcified atherosclerosis at the skull base. No suspicious intracranial vascular hyperdensity. Skull: No acute osseous abnormality identified. Sinuses/Orbits: Visualized paranasal sinuses and mastoids are stable and well pneumatized. Other: No acute orbit or scalp soft tissue finding. IMPRESSION: Chronic right MCA infarct. No acute intracranial abnormality identified. Electronically Signed   By: Odessa Fleming M.D.   On: 11/14/2020 07:13   DG Chest Port 1 View  Result Date: 11/17/2020 CLINICAL DATA:  Shortness of breath.  COVID pneumonia. EXAM: PORTABLE CHEST 1 VIEW COMPARISON:  11/15/2020. FINDINGS: Interim removal of endotracheal tube and NG tube. Interim placement of feeding tube, its tip is below left hemidiaphragm. Right IJ line in stable position. Heart size normal. Persistent mild bilateral interstitial prominence. No focal alveolar infiltrate. No pleural effusion or pneumothorax. Prominent skin folds noted on the left. No acute bony abnormality. IMPRESSION: 1. Interim removal of endotracheal tube and NG tube. Interim placement of feeding tube, its tip is below left hemidiaphragm. Right IJ line in stable position. 2. Persistent mild bilateral interstitial prominence. Electronically Signed   By: Maisie Fus  Register   On: 11/17/2020 08:32   DG Chest Port 1 View  Result Date: 11/15/2020 CLINICAL DATA:  COVID pneumonia. EXAM: PORTABLE CHEST 1 VIEW COMPARISON:  11/14/2020. FINDINGS: Endotracheal tube, NG tube, right IJ line in stable position. Heart size normal.  Persistent mild bilateral interstitial infiltrates. Interim improvement from prior exam. No pleural effusion or pneumothorax. IMPRESSION: 1. Lines and tubes in stable position. 2. Persistent mild bilateral interstitial infiltrates, improved from prior exam. Electronically Signed   By: Maisie Fus  Register   On: 11/15/2020 05:18   DG Chest Port 1 View  Result Date: 11/14/2020 CLINICAL DATA:  Encounter for central line placement. EXAM: PORTABLE CHEST 1 VIEW COMPARISON:  November 14, 2018 a FINDINGS: There is a central venous catheter with tip terminating near the cavoatrial junction. The endotracheal tube terminates above the carina. There is a enteric tube extends  below the left hemidiaphragm. There is a worsening right lower lobe and right infrahilar airspace opacity. There is a developing retrocardiac airspace opacity. There is no pneumothorax. There is mild interstitial edema with a probable small left-sided effusion. There is no pneumothorax. IMPRESSION: 1. New central venous catheter as above.  No pneumothorax. 2. Worsening bilateral airspace opacities, right greater than left, concerning for multifocal pneumonia. 3. Persistent mild interstitial edema with a probable small left-sided effusion. Electronically Signed   By: Katherine Mantle M.D.   On: 11/14/2020 02:24   DG Chest Port 1 View  Result Date: 11/14/2020 CLINICAL DATA:  Questionable sepsis, OG tube placement EXAM: PORTABLE CHEST 1 VIEW COMPARISON:  08/31/2020 FINDINGS: *Endotracheal tube tip terminates low in the trachea, 2.3 cm from the carina. Recommend retraction 2 cm to the mid trachea. *Transesophageal tube tip and side port are distal to the GE junction, terminating in the gastric body. *Telemetry leads and external support devices overlie the chest. Diffuse airways thickening and coarsened interstitial changes. More patchy right infrahilar opacity could reflect atelectasis and vascular crowding though early consolidation could have a similar  appearance. No convincing features of edema. No pneumothorax or visible effusion. The aorta is calcified. The remaining cardiomediastinal contours are unremarkable. No acute osseous or soft tissue abnormality. Degenerative changes are present in the imaged spine and shoulders. IMPRESSION: 1. Endotracheal tube tip terminates low in the trachea, 2.3 cm from the carina. Recommend retraction 2 cm to the mid trachea. 2. Transesophageal tube tip and side port are distal to the GE junction, terminating in the gastric body. 3. Some diffuse airways thickening and coarsened interstitial changes could reflect a bronchitis/bronchiolitis in the appropriate clinical setting with more patchy opacity in the right infrahilar lung possibly atelectatic or early consolidation Electronically Signed   By: Kreg Shropshire M.D.   On: 11/14/2020 00:13   DG Abd Portable 1V  Result Date: 11/16/2020 CLINICAL DATA:  Encounter for feeding tube placement EXAM: PORTABLE ABDOMEN - 1 VIEW COMPARISON:  Portable exam 1452 hours compared to 08/15/2009 FINDINGS: Tip of feeding tube projects over distal gastric antrum or pylorus. Nonobstructive bowel gas pattern. No bowel dilatation or bowel wall thickening. Osseous structures unremarkable. IMPRESSION: Tip of feeding tube projects over distal gastric antrum or pylorus. Electronically Signed   By: Ulyses Southward M.D.   On: 11/16/2020 15:01   ECHOCARDIOGRAM COMPLETE  Result Date: 11/14/2020    ECHOCARDIOGRAM REPORT   Patient Name:   JAYTEN GABBARD [facility] Date of Exam: 11/14/2020 Medical Rec #:  [id number]     Height:       66.0 in Accession #:    [id number]    Weight:       160.7 lb Date of Birth:  February 13, 1951     BSA:          1.822 m Patient Age:    70 years      BP:           101/64 mmHg Patient Gender: M             HR:           76 bpm. Exam Location:  Inpatient Procedure: 2D Echo, Color Doppler and Cardiac Doppler Indications:    Dyspnea R06.00  History:        Patient has prior history of Echocardiogram  examinations, most                 recent 09/01/2020. Signs/Symptoms:Dyspnea; Risk  Factors:Hypertension.  Sonographer:    Eulah Pont RDCS Referring Phys: [phone] DOUGLAS B MCQUAID  Sonographer Comments: Technically difficult study due to poor echo windows. IMPRESSIONS  1. Left ventricular ejection fraction, by estimation, is 60 to 65%. The left ventricle has normal function. Left ventricular endocardial border not optimally defined to evaluate regional wall motion. Left ventricular diastolic parameters are indeterminate.  2. Right ventricular systolic function is mildly reduced. The right ventricular size is mildly enlarged. There is normal pulmonary artery systolic pressure. The estimated right ventricular systolic pressure is 27.6 mmHg.  3. The mitral valve is grossly normal. Trivial mitral valve regurgitation. No evidence of mitral stenosis.  4. The aortic valve is abnormal, but not well visualized. There is moderate calcification of the aortic valve. Aortic valve regurgitation is mild. Mild aortic valve sclerosis is present, with no evidence of aortic valve stenosis.  5. The inferior vena cava is normal in size with greater than 50% respiratory variability, suggesting right atrial pressure of 3 mmHg. FINDINGS  Left Ventricle: Left ventricular ejection fraction, by estimation, is 60 to 65%. The left ventricle has normal function. Left ventricular endocardial border not optimally defined to evaluate regional wall motion. The left ventricular internal cavity size was normal in size. There is no left ventricular hypertrophy. Left ventricular diastolic parameters are indeterminate. Right Ventricle: The right ventricular size is mildly enlarged. Right vetricular wall thickness was not well visualized. Right ventricular systolic function is mildly reduced. There is normal pulmonary artery systolic pressure. The tricuspid regurgitant velocity is 2.48 m/s, and with an assumed right atrial pressure of 3  mmHg, the estimated right ventricular systolic pressure is 27.6 mmHg. Left Atrium: Left atrial size was normal in size. Right Atrium: Right atrial size was normal in size. Pericardium: The pericardium was not well visualized. Mitral Valve: The mitral valve is grossly normal. Trivial mitral valve regurgitation. No evidence of mitral valve stenosis. Tricuspid Valve: The tricuspid valve is normal in structure. Tricuspid valve regurgitation is mild. Aortic Valve: The aortic valve is abnormal. There is moderate calcification of the aortic valve. Aortic valve regurgitation is mild. Aortic regurgitation PHT measures 404 msec. Mild aortic valve sclerosis is present, with no evidence of aortic valve stenosis. Aortic valve mean gradient measures 9.0 mmHg. Aortic valve peak gradient measures 14.4 mmHg. Aortic valve area, by VTI measures 1.63 cm. Pulmonic Valve: The pulmonic valve was not well visualized. Pulmonic valve regurgitation is not visualized. Aorta: The aortic root is normal in size and structure. Venous: The inferior vena cava is normal in size with greater than 50% respiratory variability, suggesting right atrial pressure of 3 mmHg. IAS/Shunts: The interatrial septum was not well visualized.  LEFT VENTRICLE PLAX 2D LVIDd:         4.60 cm  Diastology LVIDs:         3.10 cm  LV e' medial:    5.43 cm/s LV PW:         0.70 cm  LV E/e' medial:  14.2 LV IVS:        0.90 cm  LV e' lateral:   7.62 cm/s LVOT diam:     1.70 cm  LV E/e' lateral: 10.1 LV SV:         58 LV SV Index:   32 LVOT Area:     2.27 cm  RIGHT VENTRICLE RV S prime:     12.30 cm/s TAPSE (M-mode): 1.7 cm LEFT ATRIUM  Index       RIGHT ATRIUM           Index LA diam:        3.00 cm 1.65 cm/m  RA Area:     14.50 cm LA Vol (A2C):   20.8 ml 11.41 ml/m RA Volume:   39.40 ml  21.62 ml/m LA Vol (A4C):   22.4 ml 12.29 ml/m LA Biplane Vol: 21.6 ml 11.85 ml/m  AORTIC VALVE AV Area (Vmax):    1.82 cm AV Area (Vmean):   1.77 cm AV Area (VTI):      1.63 cm AV Vmax:           190.00 cm/s AV Vmean:          142.000 cm/s AV VTI:            0.357 m AV Peak Grad:      14.4 mmHg AV Mean Grad:      9.0 mmHg LVOT Vmax:         152.00 cm/s LVOT Vmean:        111.000 cm/s LVOT VTI:          0.257 m LVOT/AV VTI ratio: 0.72 AI PHT:            404 msec  AORTA Ao Root diam: 3.20 cm Ao Asc diam:  3.10 cm MITRAL VALVE               TRICUSPID VALVE MV Area (PHT): 2.99 cm    TR Peak grad:   24.6 mmHg MV Decel Time: 254 msec    TR Vmax:        248.00 cm/s MV E velocity: 77.10 cm/s MV A velocity: 81.00 cm/s  SHUNTS MV E/A ratio:  0.95        Systemic VTI:  0.26 m                            Systemic Diam: 1.70 cm Weston Brass MD Electronically signed by Weston Brass MD Signature Date/Time: 11/14/2020/1:19:33 PM    Final

## 2020-11-17 NOTE — Progress Notes (Signed)
   11/16/20 1935  Assess: MEWS Score  Temp 100.3 F (37.9 C)  BP 140/79  Pulse Rate (!) 106  Resp (!) 28  Level of Consciousness Alert  SpO2 91 %  O2 Device Room Air  O2 Flow Rate (L/min) 0 L/min  Assess: MEWS Score  MEWS Temp 0  MEWS Systolic 0  MEWS Pulse 1  MEWS RR 2  MEWS LOC 0  MEWS Score 3  MEWS Score Color Yellow  Assess: if the MEWS score is Yellow or Red  Were vital signs taken at a resting state? Yes  Focused Assessment No change from prior assessment  Early Detection of Sepsis Score *See Row Information* High  MEWS guidelines implemented *See Row Information* Yes  Treat  MEWS Interventions Other (Comment) (increased VS)  Pain Scale PAINAD  Breathing 1  Negative Vocalization 1  Facial Expression 0  Body Language 1  Consolability 0  PAINAD Score 3  Take Vital Signs  Increase Vital Sign Frequency  Yellow: Q 2hr X 2 then Q 4hr X 2, if remains yellow, continue Q 4hrs  Escalate  MEWS: Escalate Yellow: discuss with charge nurse/RN and consider discussing with provider and RRT  Notify: Charge Nurse/RN  Name of Charge Nurse/RN Notified Tobi Bastos  Date Charge Nurse/RN Notified 11/16/20  Time Charge Nurse/RN Notified 1935  Document  Patient Outcome Other (Comment) (monitoring increased)  Progress note created (see row info) Yes

## 2020-11-17 NOTE — Progress Notes (Signed)
   11/16/20 2135  Assess: MEWS Score  Temp (!) 101 F (38.3 C)  BP (!) 166/89  Pulse Rate (!) 121  ECG Heart Rate (!) 121  Resp (!) 24  Level of Consciousness Alert  SpO2 90 %  O2 Device Room Air  O2 Flow Rate (L/min) 0 L/min  Assess: MEWS Score  MEWS Temp 1  MEWS Systolic 0  MEWS Pulse 2  MEWS RR 1  MEWS LOC 0  MEWS Score 4  MEWS Score Color Red  Assess: if the MEWS score is Yellow or Red  Were vital signs taken at a resting state? Yes  Focused Assessment No change from prior assessment  Early Detection of Sepsis Score *See Row Information* High  MEWS guidelines implemented *See Row Information* Yes  Treat  MEWS Interventions Administered prn meds/treatments  Take Vital Signs  Increase Vital Sign Frequency  Red: Q 1hr X 4 then Q 4hr X 4, if remains red, continue Q 4hrs  Escalate  MEWS: Escalate Red: discuss with charge nurse/RN and provider, consider discussing with RRT  Notify: Charge Nurse/RN  Name of Charge Nurse/RN Notified Tobi Bastos  Date Charge Nurse/RN Notified 11/16/20  Time Charge Nurse/RN Notified 2135  Notify: Provider  Provider Name/Title Gretchen  Date Provider Notified 11/16/20  Time Provider Notified 2206  Notification Type Call  Notification Reason Change in status  Response See new orders  Document  Patient Outcome Stabilized after interventions  Progress note created (see row info) Yes

## 2020-11-17 NOTE — Consult Note (Signed)
WOC Nurse Consult Note: Patient receiving care in [facility] [phone] Reason for Consult: Multiple wounds Wound type: #1 LUQ Full thickness wound, yellow/brown with no drainage. Measures 2 cm x 1.2 cm  #2 Left arm full thickness wound just below the elbow, yellow with no drainage on foam dressing, measures 1 cm x 2 cm  #3 Left heel DTPI purple, measures 2.5 cm x 2 cm #4 Intergluteal cleft DTPI purple measures 4 cm x 0.5 cm Pressure Injury POA: Yes Dressing procedure/placement/frequency: LUQ and Left arm wounds clean with soap and water, rinse and thoroughly dry the area. Prep the surrounding skin with Cavilon no sting barrier film [place] # [phone]) then apply Hydrocolloid dressing [place] # [phone]) to both wounds. Change every 2-3 days or PRN soiling.  Left heel and sacral wound: Apply foam dressing. Peel down all sites with a foam dressing EACH shift.  Record your observations.  Change foam dressing every 3 days or PRN soiling. Continue Prevalon boots bilaterally. Turn patient q 2-4 hours.  Notify the physician team if the area worsens  Monitor the wound area(s) for worsening of condition such as: Signs/symptoms of infection,  Increase in size,  Development of or worsening of odor, Development of pain, or increased pain at the affected locations.   Notify the medical team if any of these develop.  Thank you for the consult.  Discussed plan of care with the patient and bedside nurse.  WOC nurse will not follow at this time.   Please re-consult the WOC team if needed.  Renaldo Reel Katrinka Blazing, MSN, RN, CMSRN, Angus Seller, [facility] Wound Treatment Associate Pager [phone]

## 2020-11-18 ENCOUNTER — Encounter (HOSPITAL_COMMUNITY): Payer: Self-pay | Admitting: Pulmonary Disease

## 2020-11-18 ENCOUNTER — Inpatient Hospital Stay (HOSPITAL_COMMUNITY): Payer: Medicare Other

## 2020-11-18 DIAGNOSIS — R4182 Altered mental status, unspecified: Secondary | ICD-10-CM

## 2020-11-18 DIAGNOSIS — Z515 Encounter for palliative care: Secondary | ICD-10-CM

## 2020-11-18 DIAGNOSIS — Z7189 Other specified counseling: Secondary | ICD-10-CM

## 2020-11-18 LAB — COMPREHENSIVE METABOLIC PANEL
ALT: 52 U/L — ABNORMAL HIGH (ref 0–44)
AST: 86 U/L — ABNORMAL HIGH (ref 15–41)
Albumin: 1.9 g/dL — ABNORMAL LOW (ref 3.5–5.0)
Alkaline Phosphatase: 45 U/L (ref 38–126)
Anion gap: 9 (ref 5–15)
BUN: 36 mg/dL — ABNORMAL HIGH (ref 8–23)
CO2: 26 mmol/L (ref 22–32)
Calcium: 7.3 mg/dL — ABNORMAL LOW (ref 8.9–10.3)
Chloride: 116 mmol/L — ABNORMAL HIGH (ref 98–111)
Creatinine, Ser: 0.92 mg/dL (ref 0.61–1.24)
GFR, Estimated: >60 mL/min (ref >60)
Glucose, Bld: 254 mg/dL — ABNORMAL HIGH (ref 70–99)
Potassium: 3.4 mmol/L — ABNORMAL LOW (ref 3.5–5.1)
Sodium: 151 mmol/L — ABNORMAL HIGH (ref 135–145)
Total Bilirubin: 0.6 mg/dL (ref 0.3–1.2)
Total Protein: 4.5 g/dL — ABNORMAL LOW (ref 6.5–8.1)

## 2020-11-18 LAB — CBC WITH DIFFERENTIAL/PLATELET
Abs Immature Granulocytes: 0.14 10*3/uL — ABNORMAL HIGH (ref 0.00–0.07)
Basophils Absolute: 0 10*3/uL (ref 0.0–0.1)
Basophils Relative: 0 %
Eosinophils Absolute: 0 10*3/uL (ref 0.0–0.5)
Eosinophils Relative: 0 %
HCT: 29.2 % — ABNORMAL LOW (ref 39.0–52.0)
Hemoglobin: 9 g/dL — ABNORMAL LOW (ref 13.0–17.0)
Immature Granulocytes: 2 %
Lymphocytes Relative: 8 %
Lymphs Abs: 0.6 10*3/uL — ABNORMAL LOW (ref 0.7–4.0)
MCH: 29.5 pg (ref 26.0–34.0)
MCHC: 30.8 g/dL (ref 30.0–36.0)
MCV: 95.7 fL (ref 80.0–100.0)
Monocytes Absolute: 0.3 10*3/uL (ref 0.1–1.0)
Monocytes Relative: 5 %
Neutro Abs: 6.3 10*3/uL (ref 1.7–7.7)
Neutrophils Relative %: 85 %
Platelets: 88 10*3/uL — ABNORMAL LOW (ref 150–400)
RBC: 3.05 MIL/uL — ABNORMAL LOW (ref 4.22–5.81)
RDW: 13.2 % (ref 11.5–15.5)
WBC: 7.4 10*3/uL (ref 4.0–10.5)
nRBC: 0 % (ref 0.0–0.2)

## 2020-11-18 LAB — DIC (DISSEMINATED INTRAVASCULAR COAGULATION)PANEL
D-Dimer, Quant: 1.39 ug/mL-FEU — ABNORMAL HIGH (ref 0.00–0.50)
Fibrinogen: 266 mg/dL (ref 210–475)
INR: 1.3 — ABNORMAL HIGH (ref 0.8–1.2)
Platelets: 86 10*3/uL — ABNORMAL LOW (ref 150–400)
Prothrombin Time: 15.8 seconds — ABNORMAL HIGH (ref 11.4–15.2)
Smear Review: NONE SEEN
aPTT: 30 seconds (ref 24–36)

## 2020-11-18 LAB — HSV 1/2 PCR, CSF
HSV-1 DNA: NEGATIVE
HSV-2 DNA: NEGATIVE

## 2020-11-18 LAB — PROCALCITONIN: Procalcitonin: 2.05 ng/mL

## 2020-11-18 LAB — BRAIN NATRIURETIC PEPTIDE: B Natriuretic Peptide: 79.7 pg/mL (ref 0.0–100.0)

## 2020-11-18 LAB — GLUCOSE, CAPILLARY
Glucose-Capillary: 219 mg/dL — ABNORMAL HIGH (ref 70–99)
Glucose-Capillary: 221 mg/dL — ABNORMAL HIGH (ref 70–99)
Glucose-Capillary: 193 mg/dL — ABNORMAL HIGH (ref 70–99)
Glucose-Capillary: 203 mg/dL — ABNORMAL HIGH (ref 70–99)

## 2020-11-18 LAB — VARICELLA-ZOSTER BY PCR: Varicella-Zoster, PCR: NEGATIVE

## 2020-11-18 LAB — PHOSPHORUS: Phosphorus: 1.7 mg/dL — ABNORMAL LOW (ref 2.5–4.6)

## 2020-11-18 LAB — D-DIMER, QUANTITATIVE: D-Dimer, Quant: 1.31 ug/mL-FEU — ABNORMAL HIGH (ref 0.00–0.50)

## 2020-11-18 LAB — PROTIME-INR
INR: 1.3 — ABNORMAL HIGH (ref 0.8–1.2)
Prothrombin Time: 15.7 seconds — ABNORMAL HIGH (ref 11.4–15.2)

## 2020-11-18 LAB — C-REACTIVE PROTEIN: CRP: 0.7 mg/dL (ref <1.0)

## 2020-11-18 LAB — MAGNESIUM: Magnesium: 1.9 mg/dL (ref 1.7–2.4)

## 2020-11-18 MED ORDER — SCOPOLAMINE 1 MG/3DAYS TD PT72
1.0000 | MEDICATED_PATCH | TRANSDERMAL | Status: DC
  Administered 2020-11-18: 1.5 mg via TRANSDERMAL
  Filled 2020-11-18: qty 1

## 2020-11-18 MED ORDER — GLYCOPYRROLATE 0.2 MG/ML IJ SOLN
0.2000 mg | INTRAMUSCULAR | Status: DC | PRN
  Administered 2020-11-18 – 2020-11-19 (×2): 0.2 mg via INTRAVENOUS
  Filled 2020-11-18 (×2): qty 1

## 2020-11-18 MED ORDER — ATROPINE SULFATE 1 % OP SOLN
2.0000 [drp] | Freq: Four times a day (QID) | OPHTHALMIC | Status: DC | PRN
  Filled 2020-11-18: qty 2

## 2020-11-18 MED ORDER — ACETAMINOPHEN 325 MG PO TABS: 650.0000 mg | ORAL_TABLET | Freq: Four times a day (QID) | ORAL | Status: DC | PRN

## 2020-11-18 MED ORDER — HALOPERIDOL 0.5 MG PO TABS
0.5000 mg | ORAL_TABLET | ORAL | Status: DC | PRN
  Filled 2020-11-18: qty 1

## 2020-11-18 MED ORDER — MORPHINE SULFATE (PF) 2 MG/ML IV SOLN
2.0000 mg | INTRAVENOUS | Status: DC | PRN
  Administered 2020-11-18 (×2): 4 mg via INTRAVENOUS
  Administered 2020-11-18 (×2): 2 mg via INTRAVENOUS
  Administered 2020-11-18 – 2020-11-19 (×3): 4 mg via INTRAVENOUS
  Filled 2020-11-18 (×2): qty 2
  Filled 2020-11-18 (×2): qty 1
  Filled 2020-11-18 (×3): qty 2

## 2020-11-18 MED ORDER — BIOTENE DRY MOUTH MT LIQD: 15.0000 mL | OROMUCOSAL | Status: DC | PRN

## 2020-11-18 MED ORDER — ONDANSETRON HCL 4 MG/2ML IJ SOLN: 4.0000 mg | Freq: Four times a day (QID) | INTRAMUSCULAR | Status: DC | PRN

## 2020-11-18 MED ORDER — ACETAMINOPHEN 650 MG RE SUPP: 650.0000 mg | Freq: Four times a day (QID) | RECTAL | Status: DC | PRN

## 2020-11-18 MED ORDER — MORPHINE 100MG IN NS 100ML (1MG/ML) PREMIX INFUSION
2.0000 mg/h | INTRAVENOUS | Status: DC
  Administered 2020-11-18: 2 mg/h via INTRAVENOUS
  Administered 2020-11-19: 4 mg/h via INTRAVENOUS
  Filled 2020-11-18 (×2): qty 100

## 2020-11-18 MED ORDER — LORAZEPAM 2 MG/ML IJ SOLN
1.0000 mg | INTRAMUSCULAR | Status: DC | PRN
  Administered 2020-11-18: 2 mg via INTRAVENOUS
  Filled 2020-11-18: qty 1

## 2020-11-18 MED ORDER — GLYCOPYRROLATE 0.2 MG/ML IJ SOLN: 0.2000 mg | INTRAMUSCULAR | Status: DC | PRN

## 2020-11-18 MED ORDER — ONDANSETRON 4 MG PO TBDP: 4.0000 mg | ORAL_TABLET | Freq: Four times a day (QID) | ORAL | Status: DC | PRN

## 2020-11-18 MED ORDER — HALOPERIDOL LACTATE 2 MG/ML PO CONC
0.5000 mg | ORAL | Status: DC | PRN
  Filled 2020-11-18: qty 0.3

## 2020-11-18 MED ORDER — POLYVINYL ALCOHOL 1.4 % OP SOLN
1.0000 [drp] | Freq: Four times a day (QID) | OPHTHALMIC | Status: DC | PRN
  Filled 2020-11-18: qty 15

## 2020-11-18 MED ORDER — HALOPERIDOL LACTATE 5 MG/ML IJ SOLN: 0.5000 mg | INTRAMUSCULAR | Status: DC | PRN

## 2020-11-18 MED ORDER — GLYCOPYRROLATE 1 MG PO TABS
1.0000 mg | ORAL_TABLET | ORAL | Status: DC | PRN
  Filled 2020-11-18: qty 1

## 2020-11-18 NOTE — Consult Note (Signed)
Consultation Note Date: 11/18/2020   Patient Name: Bryan Davies  DOB: 01-26-1951  MRN: [id number]  Age / Sex: 70 y.o., male  PCP: [facility] Referring Physician: Thurnell Lose, MD  Reason for Consultation: Establishing goals of care and Psychosocial/spiritual support  HPI/Patient Profile: 70 y.o. male  with past medical history of right MCA stroke, bipolar disorder, tobacco abuse with 2 pack/day x 50-year history, HTN/HLD, CAD on dual antiplatelet treatment, tardive dyskinesia, bedbound for the last few months, recently treated for pneumonia, from [facility] admitted on 11/08/2020 with hypoxic respiratory failure due to acute COVID viral pneumonitis along with aspiration pneumonia.   Clinical Assessment and Goals of Care: I have reviewed medical records including EPIC notes, labs and imaging, received report from attending, examined the patient and met at bedside.  Bryan Davies is lying quietly in bed.  He appears acutely/chronically ill and very frail.  He will briefly make eye contact when asked.  He will at times respond verbally, but is difficult to understand.  I do not believe that he can make his basic needs known.  There is no family at bedside at this time due to visitor restrictions.  Call to brother, Bryan Davies to discuss diagnosis prognosis, Bryan Davies, Bryan Davies, disposition and options.  I introduced Palliative Medicine as specialized medical care for people living with serious illness. It focuses on providing relief from the symptoms and stress of a serious illness.   We talk about Bryan Davies acute and chronic illness.  I share that things are looking different for him.  We talk about his paralysis, Covid PNE, and desaturations.  Bryan Davies that Bryan Davies has not taken care of himself for many years.  He has smoked and drank, regardless of family help and support.    We discussed current illness  and what it means in the larger context of on-going co-morbidities.  Natural disease trajectory and expectations at Bryan were discussed. We talk about what we can and cannot change, and Bryan Davies's suffering.   I attempted to elicit values and goals of care important to the patient. The difference between aggressive medical intervention and comfort care was considered in light of the patient's goals of care.  Bryan states that he understands that Bryan Davies is nearing end of life and is agreeable to focus on comfort and dignity, let nature take it's course.   Questions and concerns were addressed.  The family was encouraged to call with questions or concerns.   Conference with attending, bedside nursing staff related to patient condition, needs, goals of care, comfort measures only.  HCPOA    NEXT OF KIN - Brother Bryan Davies.     SUMMARY OF RECOMMENDATIONS   Full comfort care Anticipate hospital death  Code Status/Advance Care Planning:  DNR  Symptom Management:   Per hospitalist, no additional needs at this time.  Palliative Prophylaxis:   Frequent Pain Assessment, Oral Care and Turn Reposition  Additional Recommendations (Limitations, Scope, Preferences):  Full Comfort Care  Psycho-social/Spiritual:   Desire  for further Chaplaincy support:no  Additional Recommendations: Caregiving  Support/Resources and Education on Hospice  Prognosis:  Hours - Days, full comfort care, anticipate in hospital death.  Discharge Planning: To be determined, anticipate hospital death.      Primary Diagnoses: Present on Admission: . Acute respiratory distress syndrome (ARDS) due to COVID-19 virus [facility])   I have reviewed the medical record, interviewed the patient and family, and examined the patient. The following aspects are pertinent.  Past Medical History:  Diagnosis Date  . Anxiety   . Hypertension   . Shingles 2013  . Shoulder pain    Social History   Socioeconomic History  .  Marital status: Single    Spouse name: Not on file  . Number of children: Not on file  . Years of education: Not on file  . Highest education level: Not on file  Occupational History  . Occupation: Carpenter  Tobacco Use  . Smoking status: Current Every Day Smoker    Packs/day: 2.00    Years: 50.00    Pack years: 100.00    Types: Cigarettes  . Smokeless tobacco: Never Used  . Tobacco comment: 10  cigarettes per day, waits an hour between  Vaping Use  . Vaping Use: Never used  Substance and Sexual Activity  . Alcohol use: No  . Drug use: No  . Sexual activity: Not on file  Other Topics Concern  . Not on file  Social History Narrative  . Not on file   Social Determinants of Health   Financial Resource Strain: Not on file  Food Insecurity: Not on file  Transportation Needs: Not on file  Physical Activity: Not on file  Stress: Not on file  Social Connections: Not on file   Family History  Family history unknown: Yes   Scheduled Meds: . aspirin  81 mg Per Tube Daily  . atorvastatin  40 mg Per NG tube Daily  . benztropine mesylate  1 mg Intramuscular Once  . Chlorhexidine Gluconate Cloth  6 each Topical Daily  . docusate  100 mg Per Tube BID  . enoxaparin (LOVENOX) injection  40 mg Subcutaneous Q12H  . feeding supplement (PROSource TF)  45 mL Per Tube BID  . free water  200 mL Per Tube Q6H  . insulin aspart  0-9 Units Subcutaneous Q4H  . methylPREDNISolone (SOLU-MEDROL) injection  40 mg Intravenous Daily  . metoprolol tartrate  5 mg Intravenous Once  . pantoprazole (PROTONIX) IV  40 mg Intravenous Q24H  . polyethylene glycol  17 g Per Tube Daily  . tamsulosin  0.8 mg Oral Daily  . ticagrelor  90 mg Per NG tube BID   Continuous Infusions: . sodium chloride    . sodium chloride 5 mL/hr at 11/18/20 0700  . ampicillin-sulbactam (UNASYN) IV Stopped (11/18/20 0557)  . dextrose 5 % with KCl/Additives Pediatric custom IV fluid 100 mL/hr at 11/18/20 1042  . feeding  supplement (VITAL 1.5 CAL) 55 mL/hr at 11/18/20 0532  . valproate sodium 250 mg (11/18/20 1045)   PRN Meds:.Place/Maintain arterial line **AND** sodium chloride, sodium chloride, acetaminophen (TYLENOL) oral liquid 160 mg/5 mL, acetaminophen, docusate, ipratropium-albuterol, metoprolol tartrate, ondansetron (ZOFRAN) IV, sodium chloride flush Medications Prior to Admission:  Prior to Admission medications   Medication Sig Start Date End Date Taking? Authorizing Provider  aspirin 81 MG chewable tablet Chew 4 tablets (324 mg total) by mouth daily. 09/08/20  Yes Ghimire, Henreitta Leber, MD  atorvastatin (LIPITOR) 40 MG tablet Take 1 tablet (40  mg total) by mouth daily. Patient taking differently: Take 40 mg by mouth every evening. 01/18/19  Yes Rinehuls, Early Chars, PA-C  diclofenac Sodium (VOLTAREN) 1 % GEL Apply 1 application topically 3 (three) times daily. Apply to left shoulder   Yes [provider]  divalproex (DEPAKOTE ER) 250 MG 24 hr tablet TAKE THREE TABLETS (750 MG TOTAL) BY MOUTH AT BEDTIME. Patient taking differently: Take 750 mg by mouth at bedtime. 10/25/20  Yes Eulis Canner E, NP  gabapentin (NEURONTIN) 300 MG capsule Take 1 capsule (300 mg total) by mouth 2 (two) times daily. 06/08/20  Yes Eulis Canner E, NP  haloperidol decanoate (HALDOL DECANOATE) 100 MG/ML injection Inject 100 mg into the muscle every 30 (thirty) days.   Yes [provider]  losartan (COZAAR) 50 MG tablet Take 1 tablet (50 mg total) by mouth daily. 06/01/19  Yes Johnn Hai, MD  Nutritional Supplements (NUTRITIONAL SUPPLEMENT PO) Take 120 mLs by mouth 2 (two) times daily. Med Plus 2.0   Yes [provider]  OXYGEN Inhale 2-4 L into the lungs.   Yes [provider]  tamsulosin (FLOMAX) 0.4 MG CAPS capsule Take 2 capsules (0.8 mg total) by mouth daily. Patient taking differently: Take 0.4 mg by mouth daily. 09/07/20  Yes Ghimire, Henreitta Leber, MD  ticagrelor (BRILINTA) 90 MG TABS  tablet Take 90 mg by mouth in the morning and at bedtime.   Yes [provider]  AUSTEDO 9 MG TABS TAKE TWO TABLETS BY MOUTH TWICE A DAY 11/15/20   Eulis Canner E, NP  cefTRIAXone (ROCEPHIN) 1 g injection Inject 1 g into the muscle every evening.    [provider]   Allergies  Allergen Reactions  . Pollen Extract Other (See Comments)    Sneezing    Review of Systems  Unable to perform ROS: Acuity of condition    Physical Exam Vitals and nursing note reviewed.  Constitutional:      General: He is not in acute distress.    Appearance: He is ill-appearing.  HENT:     Head: Normocephalic and atraumatic.     Mouth/Throat:     Mouth: Mucous membranes are dry.  Cardiovascular:     Rate and Rhythm: Normal rate.  Pulmonary:     Effort: Pulmonary effort is normal. No respiratory distress.  Abdominal:     General: Abdomen is flat. There is no distension.  Skin:    General: Skin is warm and dry.  Neurological:     Mental Status: He is alert.     Comments: Somewhat aphasic from stroke     Vital Signs: BP 134/68 (BP Location: Left Arm)   Pulse (!) 123   Temp (!) 102.1 F (38.9 C) (Axillary)   Resp (!) 37   Ht '5\' 6"'  (1.676 m)   Wt 63.6 kg   SpO2 95%   BMI 22.63 kg/m  Pain Scale: PAINAD   Pain Score: Asleep   SpO2: SpO2: 95 % O2 Device:SpO2: 95 % O2 Flow Rate: .O2 Flow Rate (L/min): 0 L/min  IO: Intake/output summary:   Intake/Output Summary (Last 24 hours) at 11/18/2020 1431 Last data filed at 11/18/2020 0700 Gross per 24 hour  Intake 3917.28 ml  Output 1650 ml  Net 2267.28 ml    LBM: Last BM Date:  (PTA) Baseline Weight: Weight: 72.9 kg Most recent weight: Weight: 63.6 kg     Palliative Assessment/Data:   Flowsheet Rows   Flowsheet Row Most Recent Value  Intake Tab  Referral Department Hospitalist  Unit at Time of Referral Other (Comment)  Palliative Care Primary Diagnosis Pulmonary  Date Notified 11/17/20  Palliative Care Type  New Palliative care  Reason for referral Clarify Goals of Care  Date of Admission 10/29/2020  Date first seen by Palliative Care 11/18/20  # of days Palliative referral response time 1 Day(s)  # of days IP prior to Palliative referral 4  Clinical Assessment   Palliative Performance Scale Score 20%  Pain Max last 24 hours Not able to report  Pain Min Last 24 hours Not able to report  Dyspnea Max Last 24 Hours Not able to report  Dyspnea Min Last 24 hours Not able to report  Psychosocial & Spiritual Assessment   Palliative Care Outcomes       Time In: 1010 Time Out: 1120 Time Total: 70 minutes  Greater than 50%  of this time was spent counseling and coordinating care related to the above assessment and plan.  Signed by: Drue Novel, NP   Please contact Palliative Medicine Team phone at [phone] for questions and concerns.  For individual provider: See Shea Evans

## 2020-11-18 NOTE — Progress Notes (Signed)
PT Cancellation Note  Patient Details Name: Bryan Davies MRN: [id number] DOB: 03-21-1951   Cancelled Treatment:    Reason Eval/Treat Not Completed: Medical issues which prohibited therapy. RN requesting hold on PT treatment; pt with worsening mentation, currently getting EEG. Will check back on Monday as appropriate.  Ina Homes, PT, DPT Acute Rehabilitation Services  Pager [phone] Office [phone]  Malachy Chamber 11/18/2020, 12:06 PM

## 2020-11-18 NOTE — Progress Notes (Signed)
OT Cancellation Note  Patient Details Name: Bryan Davies MRN: [id number] DOB: 05-05-1951   Cancelled Treatment:    Reason Eval/Treat Not Completed: Medical issues which prohibited therapy.  Awaiting EEG due to decreased arousal.   Eber Jones., OTR/L Acute Rehabilitation Services Pager [phone] Office [phone]   Jeani Hawking M 11/18/2020, 12:35 PM

## 2020-11-18 NOTE — Procedures (Addendum)
Patient Name: Bryan Davies  MRN: [id number]  Epilepsy Attending: Charlsie Quest  Referring Physician/Provider: Dr. Susa Raring Date: 11/18/2020 Duration: 26.04 mins  Patient history: 70 year old male with altered mental status.  EEG to evaluate for seizures.  Level of alertness: Awake  AEDs during EEG study: Depakote  Technical aspects: This EEG study was done with scalp electrodes positioned according to the 10-20 International system of electrode placement. Electrical activity was acquired at a sampling rate of 500Hz  and reviewed with a high frequency filter of 70Hz  and a low frequency filter of 1Hz . EEG data were recorded continuously and digitally stored.   Description: The posterior dominant rhythm consists of 8-9 Hz activity of moderate voltage (25-35 uV) seen predominantly in posterior head regions, symmetric and reactive to eye opening and eye closing.  EEG showed continuous 3 to 5 Hz theta-delta slowing. Hyperventilation and photic stimulation were not performed.     ABNORMALITY - Continuous slow, generalized  IMPRESSION: This study is suggestive of mild to moderate diffuse encephalopathy, nonspecific etiology. No seizures or epileptiform discharges were seen throughout the recording.  [name] 

## 2020-11-18 NOTE — Progress Notes (Signed)
EEG complete - results pending 

## 2020-11-18 NOTE — Progress Notes (Signed)
PROGRESS NOTE                                                                                                                                                                                                             Patient Demographics:    Bryan Davies, is a 70 y.o. male, DOB - 04/06/1951, KZS:[id number]  Outpatient Primary MD for the patient is [facility]    LOS - 4  Admit date - 11/28/2020    CC - Fever     Brief Narrative (HPI from H&P)   70 y/o male with underlying history of tar dive dyskinesia, right MCA stroke, bipolar disorder, ongoing smoking, dyslipidemia, CAD on dual antiplatelet treatment, hypertension, who has been bedbound for few months and currently residing in an assisted living facility, presented from assisted living facility after being sick for a few days according to family and friends with fever, shortness of breath, in the ER diagnosed with acute hypoxic respiratory failure due to combination of COVID-19 pneumonia and aspiration pneumonia, sepsis, shock, required intubation and admission to the ICU.  He was stabilized extubated and transferred to hospitalist service on 11/17/2020.  Vaccination status is unknown.    Subjective:     Bryan Davies today in bed, remains unresponsive, does not appear to be in any discomfort.   Assessment  & Plan :      1. Acute Hypoxic Resp. Failure due to Acute Covid 19 Viral Pneumonitis along with aspiration pneumonia  - his immunization status is unknown, he was intubated and was in the ICU for the first few days, he was treated in ICU with IV steroids, Remdesivir along with Unasyn for aspiration pneumonia.  He has been extubated, hypoxia has improved.  Continue supportive care.  Overall quality of life poor and long-term prognosis is guarded.  Encouraged the patient to sit up in chair in the daytime use I-S and flutter valve for pulmonary toiletry and then prone in  bed when at night.  Will advance activity and titrate down oxygen as possible.  ASpO2: 94 % O2 Flow Rate (L/min): 0 L/min FiO2 (%): 40 %  Recent Labs  Lab 11/26/2020 2349 11/14/20 0000 11/14/20 0012 11/14/20 0157 11/14/20 0420 11/14/20 0618 11/14/20 1138 11/14/20 1139 11/14/20 1150 11/15/20 0328 11/16/20 0322 11/17/20 0421 11/17/20 0929 11/18/20 0337  WBC 9.0  --   --   --  11.0*  --   --   --   --  7.9 10.5 6.8  --  7.4  HGB 11.9*  --   --    < > 9.0* 9.5*  --   --   --  10.3* 11.8* 9.6*  --  9.0*  HCT 42.0  --   --    < > 31.6* 28.0*  --   --   --  34.4* 37.8* 31.3*  --  29.2*  PLT 125*  --   --   --  132*  --   --   --   --  93* 111* 86*  --  86*   88*  CRP  --   --   --   --   --   --   --   --   --   --   --   --  0.9 0.7  BNP  --   --   --   --   --   --   --   --   --   --   --   --  113.9* 79.7  DDIMER  --   --   --   --   --   --   --   --   --   --   --   --  1.52* 1.39*   1.31*  PROCALCITON  --   --   --   --  14.84  --   --   --   --  17.71 9.44  --  3.41 2.05  AST 32  --   --   --   --   --  33 34  --  47*  --   --   --  86*  ALT 18  --   --   --   --   --  19 20  --  25  --   --   --  52*  ALKPHOS 35*  --   --   --   --   --  32* 31*  --  71  --   --   --  45  BILITOT 0.3  --   --   --   --   --  0.4 0.5  --  0.3  --   --   --  0.6  ALBUMIN 2.2*  --   --   --   --   --  2.0* 2.1*  --  2.0*  --   --   --  1.9*  INR 1.4*  --   --   --   --   --   --   --   --   --   --   --   --  1.3*   1.3*  LATICACIDVEN 1.2 4.0*  --   --   --   --   --   --  1.5  --   --   --   --   --   SARSCOV2NAA  --   --  POSITIVE*  --   --   --   --   --   --   --   --   --   --   --    < > = values in this interval not displayed.    2. Severe metabolic encephalopathy - caused by sepsis & shock due to COVID-19 infection and aspiration pneumonia, head CT  nonacute with evidence of old right MCA stroke, has underlying tar dive dyskinesia.  Neurontin, hold other sedating.  Unremarkable CSF on LP  done in ICU. Pending EEG.  3. History of right MCA stroke.  Appears to be on dual antiplatelet treatment which will be continued via NG tube if possible, continue statin via NG tube for secondary prevention as well.  It appears that he has been bedbound for the last few months.  4.  Underlying tardive dyskinesia.  Continue Depakote IV, due to encephalopathy and dehydration hold Neurontin.  5. Severe dehydration with hypernatremia.  Improved with IV D5W will continue, continue free water flushes via NG tube.  6. Moderately elevated D-dimer.  Likely due to inflammation from COVID-19 and dehydration.  Moderate dose Lovenox.  Stable DIC panel.  7. Non-ACS pattern stress induced demand mismatch causing troponin rise.  Does have underlying history of CAD.  For now continue dual antiplatelet and statin for secondary prevention, echo shows preserved EF without any noted wall motion abnormality, EKG nonacute.  Continue to monitor.      Condition - Extremely Guarded  Family Communication  :   Primary decision maker Brother John on 11/25/2020 phone #[phone] - DNR, gentle medical treatment, if declines focus on comfort.  significant other Alcario Drought [phone] on 11/17/2020    Code Status :  DNR  Consults  :  PCCM, Pall.Care  Procedures  :    1/17 lumbar puncture > WBC 4  Intubated 1/17 x 2 days  Right IJ central line placed in ICU  CT head.  Old right MCA stroke  TTE - 1. Left ventricular ejection fraction, by estimation, is 60 to 65%. The left ventricle has normal function. Left ventricular endocardial border not optimally defined to evaluate regional wall motion. Left ventricular diastolic parameters are indeterminate.  2. Right ventricular systolic function is mildly reduced. The right ventricular size is mildly enlarged. There is normal pulmonary artery systolic pressure. The estimated right ventricular systolic pressure is 27.6 mmHg.  3. The mitral valve is grossly normal. Trivial mitral  valve regurgitation. No evidence of mitral stenosis.  4. The aortic valve is abnormal, but not well visualized. There is moderate calcification of the aortic valve. Aortic valve regurgitation is mild. Mild aortic valve sclerosis is present, with no evidence of aortic valve stenosis.  5. The inferior vena cava is normal in size with greater than 50% respiratory variability, suggesting right atrial pressure of 3 mmHg    PUD Prophylaxis : PPI  Disposition Plan  :    Status is: Inpatient  Remains inpatient appropriate because:IV treatments appropriate due to intensity of illness or inability to take PO   Dispo: The patient is from: Home              Anticipated d/c is to: Home              Anticipated d/c date is: > 3 days              Patient currently is not medically stable to d/c.   DVT Prophylaxis  :  Lovenox   Lab Results  Component Value Date   PLT 88 (L) 11/18/2020   PLT 86 (L) 11/18/2020    Diet :  Diet Order            Diet NPO time specified Except for: Ice Chips  Diet effective now                  Inpatient Medications  Scheduled Meds:  aspirin  81 mg Per Tube Daily   atorvastatin  40 mg Per NG tube Daily   benztropine mesylate  1 mg Intramuscular Once   Chlorhexidine Gluconate Cloth  6 each Topical Daily   docusate  100 mg Per Tube BID   enoxaparin (LOVENOX) injection  40 mg Subcutaneous Q12H   feeding supplement (PROSource TF)  45 mL Per Tube BID   free water  200 mL Per Tube Q6H   insulin aspart  0-9 Units Subcutaneous Q4H   methylPREDNISolone (SOLU-MEDROL) injection  40 mg Intravenous Daily   metoprolol tartrate  5 mg Intravenous Once   pantoprazole (PROTONIX) IV  40 mg Intravenous Q24H   polyethylene glycol  17 g Per Tube Daily   tamsulosin  0.8 mg Oral Daily   ticagrelor  90 mg Per NG tube BID   Continuous Infusions:  sodium chloride     sodium chloride 5 mL/hr at 11/18/20 0700   ampicillin-sulbactam (UNASYN) IV Stopped  (11/18/20 0557)   dextrose 5 % with KCl/Additives Pediatric custom IV fluid 100 mL/hr at 11/18/20 1042   feeding supplement (VITAL 1.5 CAL) 55 mL/hr at 11/18/20 0532   remdesivir 100 mg in NS 100 mL 100 mg (11/17/20 1025)   valproate sodium Stopped (11/18/20 0004)   PRN Meds:.Place/Maintain arterial line **AND** sodium chloride, sodium chloride, acetaminophen (TYLENOL) oral liquid 160 mg/5 mL, acetaminophen, docusate, ipratropium-albuterol, metoprolol tartrate, ondansetron (ZOFRAN) IV, sodium chloride flush  Antibiotics  :    Anti-infectives (From admission, onward)   Start     Dose/Rate Route Frequency Ordered Stop   11/16/20 0600  vancomycin (VANCOCIN) IVPB 1000 mg/200 mL premix  Status:  Discontinued        1,000 mg 200 mL/hr over 60 Minutes Intravenous Every 48 hours 11/14/20 0449 11/14/20 0959   11/16/20 0200  vancomycin (VANCOREADY) IVPB 1500 mg/300 mL  Status:  Discontinued        1,500 mg 150 mL/hr over 120 Minutes Intravenous Every 48 hours 11/14/20 0959 11/15/20 0903   11/15/20 1000  remdesivir 100 mg in sodium chloride 0.9 % 100 mL IVPB       "Followed by" Linked Group Details   100 mg 200 mL/hr over 30 Minutes Intravenous Daily 11/14/20 0409 11/02/2020 0959   11/15/20 1000  ceFEPIme (MAXIPIME) 2 g in sodium chloride 0.9 % 100 mL IVPB  Status:  Discontinued        2 g 200 mL/hr over 30 Minutes Intravenous Every 12 hours 11/15/20 0746 11/15/20 0903   11/15/20 1000  Ampicillin-Sulbactam (UNASYN) 3 g in sodium chloride 0.9 % 100 mL IVPB        3 g 200 mL/hr over 30 Minutes Intravenous Every 8 hours 11/15/20 0903 11/21/20 0559   11/14/20 2200  ceFEPIme (MAXIPIME) 2 g in sodium chloride 0.9 % 100 mL IVPB  Status:  Discontinued        2 g 200 mL/hr over 30 Minutes Intravenous Daily at bedtime 11/14/20 0417 11/15/20 0746   11/14/20 0430  remdesivir 200 mg in sodium chloride 0.9% 250 mL IVPB       "Followed by" Linked Group Details   200 mg 580 mL/hr over 30 Minutes Intravenous  Once 11/14/20 0409 11/14/20 0854   11/14/20 0030  vancomycin (VANCOREADY) IVPB 1500 mg/300 mL        1,500 mg 150 mL/hr over 120 Minutes Intravenous  Once 2020-12-06 2353 11/14/20 0423   11/14/20 0000  ceFEPIme (MAXIPIME) 2 g in sodium  chloride 0.9 % 100 mL IVPB        2 g 200 mL/hr over 30 Minutes Intravenous  Once 06-Dec-2020 2350 11/14/20 0154   11/14/20 0000  metroNIDAZOLE (FLAGYL) IVPB 500 mg        500 mg 100 mL/hr over 60 Minutes Intravenous  Once 06-Dec-2020 2350 11/14/20 0423   11/14/20 0000  vancomycin (VANCOCIN) IVPB 1000 mg/200 mL premix  Status:  Discontinued        1,000 mg 200 mL/hr over 60 Minutes Intravenous  Once Dec 06, 2020 2350 12-06-2020 2353       Time Spent in minutes  30   Susa Raring M.D on 11/18/2020 at 11:47 AM  To page go to [email or web address]   [facility] -  Office  [phone]    See all Orders from today for further details    Objective:   Vitals:   11/18/20 0000 11/18/20 0335 11/18/20 0549 11/18/20 0725  BP: (!) 107/52 118/74  122/70  Pulse: 99 (!) 111  (!) 116  Resp: (!) 31 20  (!) 29  Temp:  99.9 F (37.7 C)  98.3 F (36.8 C)  TempSrc:  Axillary  Oral  SpO2: 94% 94% 94% 94%  Weight:  63.6 kg    Height:        Wt Readings from Last 3 Encounters:  11/18/20 63.6 kg  09/04/20 72.9 kg  08/10/20 67.1 kg     Intake/Output Summary (Last 24 hours) at 11/18/2020 1147 Last data filed at 11/18/2020 0700 Gross per 24 hour  Intake 3917.28 ml  Output 2775 ml  Net 1142.28 ml     Physical Exam  Awake but severely confused, unable to answer questions or follow commands, moving all 4 extremities by himself, NG tube in place, appears dehydrated, Left elbow pressure ulcer noted under bandage, [place].AT,PERRAL Supple Neck,No JVD, No cervical lymphadenopathy appriciated.  Symmetrical Chest wall movement, Good air movement bilaterally, CTAB RRR,No Gallops, Rubs or new Murmurs, No Parasternal Heave +ve B.Sounds, Abd Soft, No tenderness, No organomegaly  appriciated, No rebound - guarding or rigidity. No Cyanosis,       Data Review:    CBC Recent Labs  Lab 2020/12/06 2349 11/14/20 0157 11/14/20 0420 11/14/20 0618 11/15/20 0328 11/16/20 0322 11/17/20 0421 11/18/20 0337  WBC 9.0  --  11.0*  --  7.9 10.5 6.8 7.4  HGB 11.9*   < > 9.0* 9.5* 10.3* 11.8* 9.6* 9.0*  HCT 42.0   < > 31.6* 28.0* 34.4* 37.8* 31.3* 29.2*  PLT 125*  --  132*  --  93* 111* 86* 86*   88*  MCV 105.3*  --  103.3*  --  99.1 95.0 96.3 95.7  MCH 29.8  --  29.4  --  29.7 29.6 29.5 29.5  MCHC 28.3*  --  28.5*  --  29.9* 31.2 30.7 30.8  RDW 13.9  --  14.2  --  13.7 13.6 13.6 13.2  LYMPHSABS 0.5*  --   --   --   --  0.4*  --  0.6*  MONOABS 0.4  --   --   --   --  0.2  --  0.3  EOSABS 0.0  --   --   --   --  0.0  --  0.0  BASOSABS 0.0  --   --   --   --  0.0  --  0.0   < > = values in this interval not displayed.    Recent Labs  Lab  11/28/2020 2349 11/14/20 0000 11/14/20 0157 11/14/20 0420 11/14/20 0618 11/14/20 1138 11/14/20 1139 11/14/20 1150 11/14/20 1151 11/14/20 1400 11/15/20 0328 11/16/20 0322 11/16/20 2057 11/17/20 0421 11/17/20 0929 11/17/20 1606 11/18/20 0337  NA 162*  --    < > 160*   < >  --   --   --   --    < > 155* 156*  --  159* 158*  --  151*  K 3.9  --    < > 3.5   < >  --   --   --   --    < > 3.6 3.5  --  3.1* 3.2*  --  3.4*  CL 128*  --    < > 127*  --   --   --   --   --    < > 127* 125*  --  122* 123*  --  116*  CO2 19*  --   --  19*  --   --   --   --   --    < > 17* 20*  --  27 25  --  26  GLUCOSE 196*  --    < > 236*  --   --   --   --   --    < > 215* 128*  --  164* 156*  --  254*  BUN 81*  --    < > 76*  --   --   --   --   --    < > 68* 61*  --  45* 41*  --  36*  CREATININE 2.93*  --    < > 2.38*   2.47*  --   --   --   --   --    < > 1.21 1.20  --  0.90 0.91  --  0.92  CALCIUM 7.9*  --   --  7.3*  --   --   --   --   --    < > 7.8* 8.2*  --  7.9* 7.7*  --  7.3*  AST 32  --   --   --   --  33 34  --   --   --  47*  --   --    --   --   --  86*  ALT 18  --   --   --   --  19 20  --   --   --  25  --   --   --   --   --  52*  ALKPHOS 35*  --   --   --   --  32* 31*  --   --   --  71  --   --   --   --   --  45  BILITOT 0.3  --   --   --   --  0.4 0.5  --   --   --  0.3  --   --   --   --   --  0.6  ALBUMIN 2.2*  --   --   --   --  2.0* 2.1*  --   --   --  2.0*  --   --   --   --   --  1.9*  MG  --   --    < > 2.0  --  2.0  --   --   --    < >  2.0 2.2 2.1 2.1  --  1.9 1.9  CRP  --   --   --   --   --   --   --   --   --   --   --   --   --   --  0.9  --  0.7  DDIMER  --   --   --   --   --   --   --   --   --   --   --   --   --   --  1.52*  --  1.39*   1.31*  PROCALCITON  --   --   --  14.84  --   --   --   --   --   --  17.71 9.44  --   --  3.41  --  2.05  LATICACIDVEN 1.2 4.0*  --   --   --   --   --  1.5  --   --   --   --   --   --   --   --   --   INR 1.4*  --   --   --   --   --   --   --   --   --   --   --   --   --   --   --  1.3*   1.3*  HGBA1C  --   --   --   --   --  5.7*  --   --   --   --   --   --   --   --   --   --   --   AMMONIA  --   --   --   --   --   --   --   --  37*  --   --   --   --   --   --   --   --   BNP  --   --   --   --   --   --   --   --   --   --   --   --   --   --  113.9*  --  79.7   < > = values in this interval not displayed.    ------------------------------------------------------------------------------------------------------------------ No results for input(s): CHOL, HDL, LDLCALC, TRIG, CHOLHDL, LDLDIRECT in the last 72 hours.  Lab Results  Component Value Date   HGBA1C 5.7 (H) 11/14/2020   ------------------------------------------------------------------------------------------------------------------ No results for input(s): TSH, T4TOTAL, T3FREE, THYROIDAB in the last 72 hours.  Invalid input(s): FREET3  Cardiac Enzymes No results for input(s): CKMB, TROPONINI, MYOGLOBIN in the last 168 hours.  Invalid input(s):  CK ------------------------------------------------------------------------------------------------------------------    Component Value Date/Time   BNP 79.7 11/18/2020 1610    Micro Results Recent Results (from the past 240 hour(s))  Blood Culture (routine x 2)     Status: None (Preliminary result)   Collection Time: 11/14/20 12:00 AM   Specimen: BLOOD  Result Value Ref Range Status   Specimen Description BLOOD RIGHT ANTECUBITAL  Final   Special Requests   Final    BOTTLES DRAWN AEROBIC AND ANAEROBIC Blood Culture results may not be optimal due to an inadequate volume of blood received in culture bottles   Culture   Final    NO GROWTH 4 DAYS Performed at [facility], [address] [place], [place] [address]    Report Status PENDING  Incomplete  Blood Culture (routine x 2)     Status: None (Preliminary result)   Collection Time: 11/14/20 12:00 AM   Specimen: BLOOD LEFT ARM  Result Value Ref Range Status   Specimen Description BLOOD LEFT ARM  Final   Special Requests   Final    BOTTLES DRAWN AEROBIC AND ANAEROBIC Blood Culture results may not be optimal due to an inadequate volume of blood received in culture bottles   Culture   Final    NO GROWTH 4 DAYS Performed at [facility], [address], [place], [place] [address]    Report Status PENDING  Incomplete  Resp Panel by RT-PCR (Flu A&B, Covid) Nasopharyngeal Swab     Status: Abnormal   Collection Time: 11/14/20 12:12 AM   Specimen: Nasopharyngeal Swab; Nasopharyngeal(NP) swabs in vial transport medium  Result Value Ref Range Status   SARS Coronavirus 2 by RT PCR POSITIVE (A) NEGATIVE Final    Comment: RESULT CALLED TO, READ BACK BY AND VERIFIED WITH: G. TATE,RN 0116 11/14/2020 T. TYSOR (NOTE) SARS-CoV-2 target nucleic acids are DETECTED.  The SARS-CoV-2 RNA is generally detectable in upper respiratory specimens during the acute phase of infection. Positive results are indicative of the presence of the  identified virus, but do not rule out bacterial infection or co-infection with other pathogens not detected by the test. Clinical correlation with patient history and other diagnostic information is necessary to determine patient infection status. The expected result is Negative.  Fact Sheet for Patients: BloggerCourse.com  Fact Sheet for Healthcare Providers: SeriousBroker.it  This test is not yet approved or cleared by the [place] FDA and  has been authorized for detection and/or diagnosis of SARS-CoV-2 by FDA under an Emergency Use Authorization (EUA).  This EUA will remain in effect (meaning this test can  be used) for the duration of  the COVID-19 declaration under Section 564(b)(1) of the Act, 21 U.S.C. section 360bbb-3(b)(1), unless the authorization is terminated or revoked sooner.     Influenza A by PCR NEGATIVE NEGATIVE Final   Influenza B by PCR NEGATIVE NEGATIVE Final    Comment: (NOTE) The Xpert Xpress SARS-CoV-2/FLU/RSV plus assay is intended as an aid in the diagnosis of influenza from Nasopharyngeal swab specimens and should not be used as a sole basis for treatment. Nasal washings and aspirates are unacceptable for Xpert Xpress SARS-CoV-2/FLU/RSV testing.  Fact Sheet for Patients: BloggerCourse.com  Fact Sheet for Healthcare Providers: SeriousBroker.it  This test is not yet approved or cleared by the [place] FDA and has been authorized for detection and/or diagnosis of SARS-CoV-2 by FDA under an Emergency Use Authorization (EUA). This EUA will remain in effect (meaning this test can be used) for the duration of the COVID-19 declaration under Section 564(b)(1) of the Act, 21 U.S.C. section 360bbb-3(b)(1), unless the authorization is terminated or revoked.  Performed at [facility], [address], [place], [place] [address]   Urine culture      Status: None   Collection Time: 11/14/20 12:44 AM   Specimen: Urine, Catheterized  Result Value Ref Range Status   Specimen Description URINE, CATHETERIZED  Final   Special Requests NONE  Final   Culture   Final    NO GROWTH Performed at [facility], [address] [place], [place] [address]    Report Status 11/15/2020 FINAL  Final  CSF culture  Status: None   Collection Time: 11/14/20  3:45 AM   Specimen: CSF; Cerebrospinal Fluid  Result Value Ref Range Status   Specimen Description CSF  Final   Special Requests TUBE 2  Final   Gram Stain   Final    CYTOSPIN SMEAR WBC PRESENT, PREDOMINANTLY MONONUCLEAR NO ORGANISMS SEEN    Culture   Final    NO GROWTH 3 DAYS Performed at [facility], [address] [place], [place] [address]    Report Status 11/17/2020 FINAL  Final  MRSA PCR Screening     Status: None   Collection Time: 11/14/20  5:01 PM   Specimen: Nasopharyngeal  Result Value Ref Range Status   MRSA by PCR NEGATIVE NEGATIVE Final    Comment:        The GeneXpert MRSA Assay (FDA approved for NASAL specimens only), is one component of a comprehensive MRSA colonization surveillance program. It is not intended to diagnose MRSA infection nor to guide or monitor treatment for MRSA infections. Performed at [facility], [address], [place], [place] [address]   MRSA PCR Screening     Status: None   Collection Time: 11/17/20  7:42 AM   Specimen: Nasopharyngeal  Result Value Ref Range Status   MRSA by PCR NEGATIVE NEGATIVE Final    Comment:        The GeneXpert MRSA Assay (FDA approved for NASAL specimens only), is one component of a comprehensive MRSA colonization surveillance program. It is not intended to diagnose MRSA infection nor to guide or monitor treatment for MRSA infections. Performed at [facility], [address], [place], [place] [address]   Culture, blood (routine x 2)     Status: None (Preliminary result)    Collection Time: 11/17/20  9:29 AM   Specimen: BLOOD LEFT HAND  Result Value Ref Range Status   Specimen Description BLOOD LEFT HAND  Final   Special Requests   Final    BOTTLES DRAWN AEROBIC AND ANAEROBIC Blood Culture adequate volume   Culture   Final    NO GROWTH 1 DAY Performed at [facility], [address] [place], [place] [address]    Report Status PENDING  Incomplete  Culture, blood (routine x 2)     Status: None (Preliminary result)   Collection Time: 11/17/20  9:42 AM   Specimen: BLOOD  Result Value Ref Range Status   Specimen Description BLOOD LEFT ANTECUBITAL  Final   Special Requests   Final    BOTTLES DRAWN AEROBIC AND ANAEROBIC Blood Culture adequate volume   Culture   Final    NO GROWTH 1 DAY Performed at [facility], [address] [place], [place] [address]    Report Status PENDING  Incomplete    Radiology Reports CT HEAD WO CONTRAST  Result Date: 11/14/2020 CLINICAL DATA:  70 year old male positive COVID-19. Intubated. Altered mental status. EXAM: CT HEAD WITHOUT CONTRAST TECHNIQUE: Contiguous axial images were obtained from the base of the skull through the vertex without intravenous contrast. COMPARISON:  Brain MRI 09/02/2020.  Head CT 10/08/2019. and earlier. FINDINGS: Brain: Chronic encephalomalacia right operculum and insula. Some generalized cerebral volume loss since 2020. Gray-white matter differentiation elsewhere within normal limits. No midline shift, ventriculomegaly, mass effect, evidence of mass lesion, intracranial hemorrhage or evidence of cortically based acute infarction. Vascular: Calcified atherosclerosis at the skull base. No suspicious intracranial vascular hyperdensity. Skull: No acute osseous abnormality identified. Sinuses/Orbits: Visualized paranasal sinuses  and mastoids are stable and well pneumatized. Other: No acute orbit or scalp soft tissue finding. IMPRESSION: Chronic right MCA infarct. No acute intracranial abnormality  identified. Electronically Signed   By: Odessa Fleming M.D.   On: 11/14/2020 07:13   DG Chest Port 1 View  Result Date: 11/17/2020 CLINICAL DATA:  Shortness of breath.  COVID pneumonia. EXAM: PORTABLE CHEST 1 VIEW COMPARISON:  11/15/2020. FINDINGS: Interim removal of endotracheal tube and NG tube. Interim placement of feeding tube, its tip is below left hemidiaphragm. Right IJ line in stable position. Heart size normal. Persistent mild bilateral interstitial prominence. No focal alveolar infiltrate. No pleural effusion or pneumothorax. Prominent skin folds noted on the left. No acute bony abnormality. IMPRESSION: 1. Interim removal of endotracheal tube and NG tube. Interim placement of feeding tube, its tip is below left hemidiaphragm. Right IJ line in stable position. 2. Persistent mild bilateral interstitial prominence. Electronically Signed   By: Maisie Fus  Register   On: 11/17/2020 08:32   DG Chest Port 1 View  Result Date: 11/15/2020 CLINICAL DATA:  COVID pneumonia. EXAM: PORTABLE CHEST 1 VIEW COMPARISON:  11/14/2020. FINDINGS: Endotracheal tube, NG tube, right IJ line in stable position. Heart size normal. Persistent mild bilateral interstitial infiltrates. Interim improvement from prior exam. No pleural effusion or pneumothorax. IMPRESSION: 1. Lines and tubes in stable position. 2. Persistent mild bilateral interstitial infiltrates, improved from prior exam. Electronically Signed   By: Maisie Fus  Register   On: 11/15/2020 05:18   DG Chest Port 1 View  Result Date: 11/14/2020 CLINICAL DATA:  Encounter for central line placement. EXAM: PORTABLE CHEST 1 VIEW COMPARISON:  November 14, 2018 a FINDINGS: There is a central venous catheter with tip terminating near the cavoatrial junction. The endotracheal tube terminates above the carina. There is a enteric tube extends below the left hemidiaphragm. There is a worsening right lower lobe and right infrahilar airspace opacity. There is a developing retrocardiac airspace  opacity. There is no pneumothorax. There is mild interstitial edema with a probable small left-sided effusion. There is no pneumothorax. IMPRESSION: 1. New central venous catheter as above.  No pneumothorax. 2. Worsening bilateral airspace opacities, right greater than left, concerning for multifocal pneumonia. 3. Persistent mild interstitial edema with a probable small left-sided effusion. Electronically Signed   By: Katherine Mantle M.D.   On: 11/14/2020 02:24   DG Chest Port 1 View  Result Date: 11/14/2020 CLINICAL DATA:  Questionable sepsis, OG tube placement EXAM: PORTABLE CHEST 1 VIEW COMPARISON:  08/31/2020 FINDINGS: *Endotracheal tube tip terminates low in the trachea, 2.3 cm from the carina. Recommend retraction 2 cm to the mid trachea. *Transesophageal tube tip and side port are distal to the GE junction, terminating in the gastric body. *Telemetry leads and external support devices overlie the chest. Diffuse airways thickening and coarsened interstitial changes. More patchy right infrahilar opacity could reflect atelectasis and vascular crowding though early consolidation could have a similar appearance. No convincing features of edema. No pneumothorax or visible effusion. The aorta is calcified. The remaining cardiomediastinal contours are unremarkable. No acute osseous or soft tissue abnormality. Degenerative changes are present in the imaged spine and shoulders. IMPRESSION: 1. Endotracheal tube tip terminates low in the trachea, 2.3 cm from the carina. Recommend retraction 2 cm to the mid trachea. 2. Transesophageal tube tip and side port are distal to the GE junction, terminating in the gastric body. 3. Some diffuse airways thickening and coarsened interstitial changes could reflect a bronchitis/bronchiolitis in the appropriate clinical setting with  more patchy opacity in the right infrahilar lung possibly atelectatic or early consolidation Electronically Signed   By: Kreg Shropshire M.D.   On:  11/14/2020 00:13   DG Abd Portable 1V  Result Date: 11/16/2020 CLINICAL DATA:  Encounter for feeding tube placement EXAM: PORTABLE ABDOMEN - 1 VIEW COMPARISON:  Portable exam 1452 hours compared to 08/15/2009 FINDINGS: Tip of feeding tube projects over distal gastric antrum or pylorus. Nonobstructive bowel gas pattern. No bowel dilatation or bowel wall thickening. Osseous structures unremarkable. IMPRESSION: Tip of feeding tube projects over distal gastric antrum or pylorus. Electronically Signed   By: Ulyses Southward M.D.   On: 11/16/2020 15:01   ECHOCARDIOGRAM COMPLETE  Result Date: 11/14/2020    ECHOCARDIOGRAM REPORT   Patient Name:   KIJUAN GALLICCHIO [facility] Date of Exam: 11/14/2020 Medical Rec #:  [id number]     Height:       66.0 in Accession #:    [id number]    Weight:       160.7 lb Date of Birth:  04-Jan-1951     BSA:          1.822 m Patient Age:    54 years      BP:           101/64 mmHg Patient Gender: M             HR:           76 bpm. Exam Location:  Inpatient Procedure: 2D Echo, Color Doppler and Cardiac Doppler Indications:    Dyspnea R06.00  History:        Patient has prior history of Echocardiogram examinations, most                 recent 09/01/2020. Signs/Symptoms:Dyspnea; Risk                 Factors:Hypertension.  Sonographer:    Eulah Pont RDCS Referring Phys: [phone] DOUGLAS B MCQUAID  Sonographer Comments: Technically difficult study due to poor echo windows. IMPRESSIONS  1. Left ventricular ejection fraction, by estimation, is 60 to 65%. The left ventricle has normal function. Left ventricular endocardial border not optimally defined to evaluate regional wall motion. Left ventricular diastolic parameters are indeterminate.  2. Right ventricular systolic function is mildly reduced. The right ventricular size is mildly enlarged. There is normal pulmonary artery systolic pressure. The estimated right ventricular systolic pressure is 27.6 mmHg.  3. The mitral valve is grossly normal. Trivial mitral  valve regurgitation. No evidence of mitral stenosis.  4. The aortic valve is abnormal, but not well visualized. There is moderate calcification of the aortic valve. Aortic valve regurgitation is mild. Mild aortic valve sclerosis is present, with no evidence of aortic valve stenosis.  5. The inferior vena cava is normal in size with greater than 50% respiratory variability, suggesting right atrial pressure of 3 mmHg. FINDINGS  Left Ventricle: Left ventricular ejection fraction, by estimation, is 60 to 65%. The left ventricle has normal function. Left ventricular endocardial border not optimally defined to evaluate regional wall motion. The left ventricular internal cavity size was normal in size. There is no left ventricular hypertrophy. Left ventricular diastolic parameters are indeterminate. Right Ventricle: The right ventricular size is mildly enlarged. Right vetricular wall thickness was not well visualized. Right ventricular systolic function is mildly reduced. There is normal pulmonary artery systolic pressure. The tricuspid regurgitant velocity is 2.48 m/s, and with an assumed right atrial pressure of 3 mmHg, the estimated right ventricular systolic  pressure is 27.6 mmHg. Left Atrium: Left atrial size was normal in size. Right Atrium: Right atrial size was normal in size. Pericardium: The pericardium was not well visualized. Mitral Valve: The mitral valve is grossly normal. Trivial mitral valve regurgitation. No evidence of mitral valve stenosis. Tricuspid Valve: The tricuspid valve is normal in structure. Tricuspid valve regurgitation is mild. Aortic Valve: The aortic valve is abnormal. There is moderate calcification of the aortic valve. Aortic valve regurgitation is mild. Aortic regurgitation PHT measures 404 msec. Mild aortic valve sclerosis is present, with no evidence of aortic valve stenosis. Aortic valve mean gradient measures 9.0 mmHg. Aortic valve peak gradient measures 14.4 mmHg. Aortic valve area,  by VTI measures 1.63 cm. Pulmonic Valve: The pulmonic valve was not well visualized. Pulmonic valve regurgitation is not visualized. Aorta: The aortic root is normal in size and structure. Venous: The inferior vena cava is normal in size with greater than 50% respiratory variability, suggesting right atrial pressure of 3 mmHg. IAS/Shunts: The interatrial septum was not well visualized.  LEFT VENTRICLE PLAX 2D LVIDd:         4.60 cm  Diastology LVIDs:         3.10 cm  LV e' medial:    5.43 cm/s LV PW:         0.70 cm  LV E/e' medial:  14.2 LV IVS:        0.90 cm  LV e' lateral:   7.62 cm/s LVOT diam:     1.70 cm  LV E/e' lateral: 10.1 LV SV:         58 LV SV Index:   32 LVOT Area:     2.27 cm  RIGHT VENTRICLE RV S prime:     12.30 cm/s TAPSE (M-mode): 1.7 cm LEFT ATRIUM             Index       RIGHT ATRIUM           Index LA diam:        3.00 cm 1.65 cm/m  RA Area:     14.50 cm LA Vol (A2C):   20.8 ml 11.41 ml/m RA Volume:   39.40 ml  21.62 ml/m LA Vol (A4C):   22.4 ml 12.29 ml/m LA Biplane Vol: 21.6 ml 11.85 ml/m  AORTIC VALVE AV Area (Vmax):    1.82 cm AV Area (Vmean):   1.77 cm AV Area (VTI):     1.63 cm AV Vmax:           190.00 cm/s AV Vmean:          142.000 cm/s AV VTI:            0.357 m AV Peak Grad:      14.4 mmHg AV Mean Grad:      9.0 mmHg LVOT Vmax:         152.00 cm/s LVOT Vmean:        111.000 cm/s LVOT VTI:          0.257 m LVOT/AV VTI ratio: 0.72 AI PHT:            404 msec  AORTA Ao Root diam: 3.20 cm Ao Asc diam:  3.10 cm MITRAL VALVE               TRICUSPID VALVE MV Area (PHT): 2.99 cm    TR Peak grad:   24.6 mmHg MV Decel Time: 254 msec    TR Vmax:  248.00 cm/s MV E velocity: 77.10 cm/s MV A velocity: 81.00 cm/s  SHUNTS MV E/A ratio:  0.95        Systemic VTI:  0.26 m                            Systemic Diam: 1.70 cm Weston BrassGayatri Acharya MD Electronically signed by Weston BrassGayatri Acharya MD Signature Date/Time: 11/14/2020/1:19:33 PM    Final

## 2020-11-18 NOTE — Progress Notes (Signed)
  Speech Language Pathology Treatment: Dysphagia  Patient Details Name: Bryan Davies MRN: [id number] DOB: [birth year] Today's Date: 11/18/2020 Time: 6568-1275 SLP Time Calculation (min) (ACUTE ONLY): 15 min  Assessment / Plan / Recommendation Clinical Impression  Pt was seen for dysphagia treatment. He was adequately alert, but verbal output was limited. Vocal quality was wet, suggesting reduced secretion management. Oral care was provided with NPO oral care kit connected to suction in an effort to reduce bacterial load of secretions which will likely be aspirated. Vocal quality worsened with repositioning upright despite SLP's suctioning. Pt's nurse, Randall Hiss, was contacted regarding the pt's significantly wet vocal quality which did not improve with suctioning from SLP. No p.o. trials were given during this session due pt's poor secretion management, the weak nature of his cough, and his inability to demonstrate a volitional swallow despite verbal and tactile cues. Randall Hiss, RN reported that the pt was suctioned ~15 minutes prior and that he has exhibited poor secretion management today. SLP was outside of room on phone with Randall Hiss, RN and monitoring pt's vitals on the monitor. Randall Hiss was advised of pt's progressively declining SpO2 to 80%; he immediately arrived to assess pt and provide additional suctioning which improved SpO2 to 94%. Pt's RN reported that he will follow up with Dr. Candiss Norse again regarding [place]. A strict NPO status is recommended at this time. Considering pt's decline in swallow function and secretion management, allowance of ice chips will be discontinued unless pt's GOC change to comfort. SLP will continue to follow pt pending pt's [place].    HPI HPI: 70 y.o. male presented from ALF on 1/17 with dyspnea, hypoxemia requiring intubation in the ED, extubated 1/18.  Found to have COVID pna.  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 No seizures or epileptiform discharges      SLP Plan  Continue with current plan of care       Recommendations  Diet recommendations: NPO (discontinue allowance of ice chips.) Medication Administration: Via alternative means                Oral Care Recommendations: Oral care prior to ice chip/H20;Oral care QID Follow up Recommendations: Other (comment) (tba) SLP Visit Diagnosis: Dysphagia, oropharyngeal phase (R13.12) Plan: Continue with current plan of care       [place]augh I. Hardin Negus, [place], [place] Office number [phone] Pager [place] 11/18/2020, 3:37 PM

## 2020-11-19 LAB — CULTURE, BLOOD (ROUTINE X 2)
Culture: NO GROWTH
Culture: NO GROWTH

## 2020-11-22 LAB — CULTURE, BLOOD (ROUTINE X 2)
Culture: NO GROWTH
Culture: NO GROWTH
Special Requests: ADEQUATE
Special Requests: ADEQUATE

## 2020-11-29 NOTE — Progress Notes (Signed)
PROGRESS NOTE                                                                                                                                                                                                             Patient Demographics:    Linford ArnoldRoger Gallego, is a 70 y.o. male, DOB - 04-10-1951, GMW:[id number]  Outpatient Primary MD for the patient is [facility]    LOS - 5  Admit date - 11/25/2020    CC - Fever     Brief Narrative (HPI from H&P)   70 y/o male with underlying history of tar dive dyskinesia, right MCA stroke, bipolar disorder, ongoing smoking, dyslipidemia, CAD on dual antiplatelet treatment, hypertension, who has been bedbound for few months and currently residing in an assisted living facility, presented from assisted living facility after being sick for a few days according to family and friends with fever, shortness of breath, in the ER diagnosed with acute hypoxic respiratory failure due to combination of COVID-19 pneumonia and aspiration pneumonia, sepsis, shock, required intubation and admission to the ICU.  He was stabilized extubated and transferred to hospitalist service on 11/17/2020.  Vaccination status is unknown.    Subjective:     Linford Arnoldoger Ki today in bed, remains unresponsive, on morphine drip and full comfort measures   Assessment  & Plan :   He is now on morphine drip with full comfort measures, palliative care on board, expect him to pass away soon.  Now only on comfort meds.  Other active medical issues addressed earlier this admission are below.      1. Acute Hypoxic Resp. Failure due to Acute Covid 19 Viral Pneumonitis along with aspiration pneumonia  - his immunization status is unknown, he was intubated and was in the ICU for the first few days, he was treated in ICU with IV steroids, Remdesivir along with Unasyn for aspiration pneumonia.  He has been extubated, hypoxia has improved.   Continue supportive care.  Overall quality of life poor and long-term prognosis is guarded.  Encouraged the patient to sit up in chair in the daytime use I-S and flutter valve for pulmonary toiletry and then prone in bed when at night.  Will advance activity and titrate down oxygen as possible.  ASpO2: (!) 86 % O2 Flow Rate (L/min): 0 L/min FiO2 (%): 40 %  Recent Labs  Lab 11/17/2020 2349 11/14/20 0000 11/14/20 0012 11/14/20 0157 11/14/20 0420 11/14/20 0618 11/14/20 1138 11/14/20 1139 11/14/20 1150 11/15/20 0328 11/16/20 0322 11/17/20 0421 11/17/20 0929 11/18/20 0337  WBC 9.0  --   --   --   --   --   --   --   --  7.9 10.5 6.8  --  7.4  HGB 11.9*  --   --    < >  --  9.5*  --   --   --  10.3* 11.8* 9.6*  --  9.0*  HCT 42.0  --   --    < >  --  28.0*  --   --   --  34.4* 37.8* 31.3*  --  29.2*  PLT 125*  --   --   --   --   --   --   --   --  93* 111* 86*  --  86*  88*  CRP  --   --   --   --   --   --   --   --   --   --   --   --  0.9 0.7  BNP  --   --   --   --   --   --   --   --   --   --   --   --  113.9* 79.7  DDIMER  --   --   --   --   --   --   --   --   --   --   --   --  1.52* 1.39*  1.31*  PROCALCITON  --   --   --   --  14.84  --   --   --   --  17.71 9.44  --  3.41 2.05  AST 32  --   --   --   --   --  33 34  --  47*  --   --   --  86*  ALT 18  --   --   --   --   --  19 20  --  25  --   --   --  52*  ALKPHOS 35*  --   --   --   --   --  32* 31*  --  71  --   --   --  45  BILITOT 0.3  --   --   --   --   --  0.4 0.5  --  0.3  --   --   --  0.6  ALBUMIN 2.2*  --   --   --   --   --  2.0* 2.1*  --  2.0*  --   --   --  1.9*  INR 1.4*  --   --   --   --   --   --   --   --   --   --   --   --  1.3*  1.3*  LATICACIDVEN 1.2 4.0*  --   --   --   --   --   --  1.5  --   --   --   --   --   SARSCOV2NAA  --   --  POSITIVE*  --   --   --   --   --   --   --   --   --   --   --    < > =  values in this interval not displayed.    2. Severe metabolic encephalopathy -  caused by sepsis & shock due to COVID-19 infection and aspiration pneumonia, head CT nonacute with evidence of old right MCA stroke, has underlying tar dive dyskinesia.  Neurontin, hold other sedating.  Unremarkable CSF on LP done in ICU. Pending EEG.  3. History of right MCA stroke.  Appears to be on dual antiplatelet treatment which will be continued via NG tube if possible, continue statin via NG tube for secondary prevention as well.  It appears that he has been bedbound for the last few months.  4.  Underlying tardive dyskinesia.  Continue Depakote IV, due to encephalopathy and dehydration hold Neurontin.  5. Severe dehydration with hypernatremia.  Improved with IV D5W will continue, continue free water flushes via NG tube.  6. Moderately elevated D-dimer.  Likely due to inflammation from COVID-19 and dehydration.  Moderate dose Lovenox.  Stable DIC panel.  7. Non-ACS pattern stress induced demand mismatch causing troponin rise.  Does have underlying history of CAD.  For now continue dual antiplatelet and statin for secondary prevention, echo shows preserved EF without any noted wall motion abnormality, EKG nonacute.  Continue to monitor.      Condition - Extremely Guarded  Family Communication  :   Primary decision maker Brother John on 11/25/2020 phone #[phone] - DNR, gentle medical treatment, if declines focus on comfort.  significant other Alcario Drought [phone] on 11/17/2020    Code Status :  DNR  Consults  :  PCCM, Pall.Care  Procedures  :    1/17 lumbar puncture > WBC 4  Intubated 1/17 x 2 days  Right IJ central line placed in ICU  CT head.  Old right MCA stroke  TTE - 1. Left ventricular ejection fraction, by estimation, is 60 to 65%. The left ventricle has normal function. Left ventricular endocardial border not optimally defined to evaluate regional wall motion. Left ventricular diastolic parameters are indeterminate.  2. Right ventricular systolic function is mildly  reduced. The right ventricular size is mildly enlarged. There is normal pulmonary artery systolic pressure. The estimated right ventricular systolic pressure is 27.6 mmHg.  3. The mitral valve is grossly normal. Trivial mitral valve regurgitation. No evidence of mitral stenosis.  4. The aortic valve is abnormal, but not well visualized. There is moderate calcification of the aortic valve. Aortic valve regurgitation is mild. Mild aortic valve sclerosis is present, with no evidence of aortic valve stenosis.  5. The inferior vena cava is normal in size with greater than 50% respiratory variability, suggesting right atrial pressure of 3 mmHg    PUD Prophylaxis : PPI  Disposition Plan  :    Status is: Inpatient  Remains inpatient appropriate because:IV treatments appropriate due to intensity of illness or inability to take PO   Dispo: The patient is from: Home              Anticipated d/c is to: Home              Anticipated d/c date is: > 3 days              Patient currently is not medically stable to d/c.   DVT Prophylaxis  :  Lovenox   Lab Results  Component Value Date   PLT 88 (L) 11/18/2020   PLT 86 (L) 11/18/2020    Diet :  Diet Order            Diet NPO time  specified Except for: Ice Chips  Diet effective now                  Inpatient Medications  Scheduled Meds: . scopolamine  1 patch Transdermal Q72H   Continuous Infusions: . sodium chloride Stopped (11/18/20 1223)  . morphine 4 mg/hr (11/18/20 1910)   PRN Meds:.sodium chloride, acetaminophen **OR** acetaminophen, antiseptic oral rinse, atropine, glycopyrrolate **OR** glycopyrrolate **OR** glycopyrrolate, haloperidol **OR** haloperidol **OR** haloperidol lactate, LORazepam, morphine injection, ondansetron **OR** ondansetron (ZOFRAN) IV, polyvinyl alcohol, sodium chloride flush  Antibiotics  :    Anti-infectives (From admission, onward)   Start     Dose/Rate Route Frequency Ordered Stop   11/16/20 0600   vancomycin (VANCOCIN) IVPB 1000 mg/200 mL premix  Status:  Discontinued        1,000 mg 200 mL/hr over 60 Minutes Intravenous Every 48 hours 11/14/20 0449 11/14/20 0959   11/16/20 0200  vancomycin (VANCOREADY) IVPB 1500 mg/300 mL  Status:  Discontinued        1,500 mg 150 mL/hr over 120 Minutes Intravenous Every 48 hours 11/14/20 0959 11/15/20 0903   11/15/20 1000  remdesivir 100 mg in sodium chloride 0.9 % 100 mL IVPB       "Followed by" Linked Group Details   100 mg 200 mL/hr over 30 Minutes Intravenous Daily 11/14/20 0409 11/18/20 1249   11/15/20 1000  ceFEPIme (MAXIPIME) 2 g in sodium chloride 0.9 % 100 mL IVPB  Status:  Discontinued        2 g 200 mL/hr over 30 Minutes Intravenous Every 12 hours 11/15/20 0746 11/15/20 0903   11/15/20 1000  Ampicillin-Sulbactam (UNASYN) 3 g in sodium chloride 0.9 % 100 mL IVPB  Status:  Discontinued        3 g 200 mL/hr over 30 Minutes Intravenous Every 8 hours 11/15/20 0903 11/18/20 1546   11/14/20 2200  ceFEPIme (MAXIPIME) 2 g in sodium chloride 0.9 % 100 mL IVPB  Status:  Discontinued        2 g 200 mL/hr over 30 Minutes Intravenous Daily at bedtime 11/14/20 0417 11/15/20 0746   11/14/20 0430  remdesivir 200 mg in sodium chloride 0.9% 250 mL IVPB       "Followed by" Linked Group Details   200 mg 580 mL/hr over 30 Minutes Intravenous Once 11/14/20 0409 11/14/20 0854   11/14/20 0030  vancomycin (VANCOREADY) IVPB 1500 mg/300 mL        1,500 mg 150 mL/hr over 120 Minutes Intravenous  Once 10-Dec-2020 2353 11/14/20 0423   11/14/20 0000  ceFEPIme (MAXIPIME) 2 g in sodium chloride 0.9 % 100 mL IVPB        2 g 200 mL/hr over 30 Minutes Intravenous  Once 12-10-2020 2350 11/14/20 0154   11/14/20 0000  metroNIDAZOLE (FLAGYL) IVPB 500 mg        500 mg 100 mL/hr over 60 Minutes Intravenous  Once 10-Dec-2020 2350 11/14/20 0423   11/14/20 0000  vancomycin (VANCOCIN) IVPB 1000 mg/200 mL premix  Status:  Discontinued        1,000 mg 200 mL/hr over 60 Minutes  Intravenous  Once 2020-12-10 2350 December 10, 2020 2353       Time Spent in minutes  30   Susa Raring M.D on 11/21/2020 at 10:55 AM  To page go to [email or web address]   [facility] -  Office  [phone]    See all Orders from today for further details    Objective:   Vitals:   11/18/20 1204  11/18/20 1531 11/18/20 2019 11/21/2020 0807  BP: 134/68 135/84 (!) 70/58 (!) 72/45  Pulse: (!) 123 (!) 149 96 (!) 105  Resp: (!) 37 (!) 37 (!) 8 (!) 8  Temp: (!) 102.1 F (38.9 C) (!) 101.3 F (38.5 C) 98 F (36.7 C)   TempSrc: Axillary Axillary Axillary   SpO2: 95% 95% 90% (!) 86%  Weight:      Height:        Wt Readings from Last 3 Encounters:  11/18/20 63.6 kg  09/04/20 72.9 kg  08/10/20 67.1 kg     Intake/Output Summary (Last 24 hours) at 11/20/2020 1055 Last data filed at 11/03/2020 0309 Gross per 24 hour  Intake 895.22 ml  Output 1675 ml  Net -779.78 ml     Physical Exam  Patient in bed appears to be in no distress on morphine drip, obtunded.  Shallow breaths.  Upper airway gurgling.    Data Review:    CBC Recent Labs  Lab 11/27/20 2349 11/14/20 0157 11/14/20 0618 11/15/20 0328 11/16/20 0322 11/17/20 0421 11/18/20 0337  WBC 9.0  --   --  7.9 10.5 6.8 7.4  HGB 11.9*   < > 9.5* 10.3* 11.8* 9.6* 9.0*  HCT 42.0   < > 28.0* 34.4* 37.8* 31.3* 29.2*  PLT 125*  --   --  93* 111* 86* 86*  88*  MCV 105.3*  --   --  99.1 95.0 96.3 95.7  MCH 29.8  --   --  29.7 29.6 29.5 29.5  MCHC 28.3*  --   --  29.9* 31.2 30.7 30.8  RDW 13.9  --   --  13.7 13.6 13.6 13.2  LYMPHSABS 0.5*  --   --   --  0.4*  --  0.6*  MONOABS 0.4  --   --   --  0.2  --  0.3  EOSABS 0.0  --   --   --  0.0  --  0.0  BASOSABS 0.0  --   --   --  0.0  --  0.0   < > = values in this interval not displayed.    Recent Labs  Lab 11-27-20 2349 11/14/20 0000 11/14/20 0157 11/14/20 0420 11/14/20 0618 11/14/20 1138 11/14/20 1139 11/14/20 1150 11/14/20 1151 11/14/20 1400 11/15/20 0328  11/16/20 0322 11/16/20 2057 11/17/20 0421 11/17/20 0929 11/17/20 1606 11/18/20 0337  NA 162*  --    < > 160*   < >  --   --   --   --    < > 155* 156*  --  159* 158*  --  151*  K 3.9  --    < > 3.5   < >  --   --   --   --    < > 3.6 3.5  --  3.1* 3.2*  --  3.4*  CL 128*  --    < > 127*  --   --   --   --   --    < > 127* 125*  --  122* 123*  --  116*  CO2 19*  --   --  19*  --   --   --   --   --    < > 17* 20*  --  27 25  --  26  GLUCOSE 196*  --    < > 236*  --   --   --   --   --    < >  215* 128*  --  164* 156*  --  254*  BUN 81*  --    < > 76*  --   --   --   --   --    < > 68* 61*  --  45* 41*  --  36*  CREATININE 2.93*  --    < > 2.38*  --   --   --   --   --    < > 1.21 1.20  --  0.90 0.91  --  0.92  CALCIUM 7.9*  --   --  7.3*  --   --   --   --   --    < > 7.8* 8.2*  --  7.9* 7.7*  --  7.3*  AST 32  --   --   --   --  33 34  --   --   --  47*  --   --   --   --   --  86*  ALT 18  --   --   --   --  19 20  --   --   --  25  --   --   --   --   --  52*  ALKPHOS 35*  --   --   --   --  32* 31*  --   --   --  71  --   --   --   --   --  45  BILITOT 0.3  --   --   --   --  0.4 0.5  --   --   --  0.3  --   --   --   --   --  0.6  ALBUMIN 2.2*  --   --   --   --  2.0* 2.1*  --   --   --  2.0*  --   --   --   --   --  1.9*  MG  --   --    < > 2.0  --  2.0  --   --   --    < > 2.0 2.2 2.1 2.1  --  1.9 1.9  CRP  --   --   --   --   --   --   --   --   --   --   --   --   --   --  0.9  --  0.7  DDIMER  --   --   --   --   --   --   --   --   --   --   --   --   --   --  1.52*  --  1.39*  1.31*  PROCALCITON  --   --   --  14.84  --   --   --   --   --   --  17.71 9.44  --   --  3.41  --  2.05  LATICACIDVEN 1.2 4.0*  --   --   --   --   --  1.5  --   --   --   --   --   --   --   --   --   INR 1.4*  --   --   --   --   --   --   --   --   --   --   --   --   --   --   --  1.3*  1.3*  HGBA1C  --   --   --   --   --  5.7*  --   --   --   --   --   --   --   --   --   --   --   AMMONIA  --    --   --   --   --   --   --   --  37*  --   --   --   --   --   --   --   --   BNP  --   --   --   --   --   --   --   --   --   --   --   --   --   --  113.9*  --  79.7   < > = values in this interval not displayed.    ------------------------------------------------------------------------------------------------------------------ No results for input(s): CHOL, HDL, LDLCALC, TRIG, CHOLHDL, LDLDIRECT in the last 72 hours.  Lab Results  Component Value Date   HGBA1C 5.7 (H) 11/14/2020   ------------------------------------------------------------------------------------------------------------------ No results for input(s): TSH, T4TOTAL, T3FREE, THYROIDAB in the last 72 hours.  Invalid input(s): FREET3  Cardiac Enzymes No results for input(s): CKMB, TROPONINI, MYOGLOBIN in the last 168 hours.  Invalid input(s): CK ------------------------------------------------------------------------------------------------------------------    Component Value Date/Time   BNP 79.7 11/18/2020 1610    Micro Results Recent Results (from the past 240 hour(s))  Blood Culture (routine x 2)     Status: None (Preliminary result)   Collection Time: 11/14/20 12:00 AM   Specimen: BLOOD  Result Value Ref Range Status   Specimen Description BLOOD RIGHT ANTECUBITAL  Final   Special Requests   Final    BOTTLES DRAWN AEROBIC AND ANAEROBIC Blood Culture results may not be optimal due to an inadequate volume of blood received in culture bottles   Culture   Final    NO GROWTH 4 DAYS Performed at [facility], [address] [place], [place] [address]    Report Status PENDING  Incomplete  Blood Culture (routine x 2)     Status: None (Preliminary result)   Collection Time: 11/14/20 12:00 AM   Specimen: BLOOD LEFT ARM  Result Value Ref Range Status   Specimen Description BLOOD LEFT ARM  Final   Special Requests   Final    BOTTLES DRAWN AEROBIC AND ANAEROBIC Blood Culture results may not be optimal  due to an inadequate volume of blood received in culture bottles   Culture   Final    NO GROWTH 4 DAYS Performed at [facility], [address], [place], [place] [address]    Report Status PENDING  Incomplete  Resp Panel by RT-PCR (Flu A&B, Covid) Nasopharyngeal Swab     Status: Abnormal   Collection Time: 11/14/20 12:12 AM   Specimen: Nasopharyngeal Swab; Nasopharyngeal(NP) swabs in vial transport medium  Result Value Ref Range Status   SARS Coronavirus 2 by RT PCR POSITIVE (A) NEGATIVE Final    Comment: RESULT CALLED TO, READ BACK BY AND VERIFIED WITH: G. TATE,RN 0116 11/14/2020 T. TYSOR (NOTE) SARS-CoV-2 target nucleic acids are DETECTED.  The SARS-CoV-2 RNA is generally detectable in upper respiratory specimens during the acute phase of infection. Positive results are indicative of the presence of the identified virus, but do not rule out bacterial infection or co-infection with other pathogens not detected by  the test. Clinical correlation with patient history and other diagnostic information is necessary to determine patient infection status. The expected result is Negative.  Fact Sheet for Patients: BloggerCourse.com  Fact Sheet for Healthcare Providers: SeriousBroker.it  This test is not yet approved or cleared by the [place] FDA and  has been authorized for detection and/or diagnosis of SARS-CoV-2 by FDA under an Emergency Use Authorization (EUA).  This EUA will remain in effect (meaning this test can  be used) for the duration of  the COVID-19 declaration under Section 564(b)(1) of the Act, 21 U.S.C. section 360bbb-3(b)(1), unless the authorization is terminated or revoked sooner.     Influenza A by PCR NEGATIVE NEGATIVE Final   Influenza B by PCR NEGATIVE NEGATIVE Final    Comment: (NOTE) The Xpert Xpress SARS-CoV-2/FLU/RSV plus assay is intended as an aid in the diagnosis of influenza from  Nasopharyngeal swab specimens and should not be used as a sole basis for treatment. Nasal washings and aspirates are unacceptable for Xpert Xpress SARS-CoV-2/FLU/RSV testing.  Fact Sheet for Patients: BloggerCourse.com  Fact Sheet for Healthcare Providers: SeriousBroker.it  This test is not yet approved or cleared by the [place] FDA and has been authorized for detection and/or diagnosis of SARS-CoV-2 by FDA under an Emergency Use Authorization (EUA). This EUA will remain in effect (meaning this test can be used) for the duration of the COVID-19 declaration under Section 564(b)(1) of the Act, 21 U.S.C. section 360bbb-3(b)(1), unless the authorization is terminated or revoked.  Performed at [facility], [address] [place], [place] [address]   Urine culture     Status: None   Collection Time: 11/14/20 12:44 AM   Specimen: Urine, Catheterized  Result Value Ref Range Status   Specimen Description URINE, CATHETERIZED  Final   Special Requests NONE  Final   Culture   Final    NO GROWTH Performed at [facility], [address], [place], [place] [address]    Report Status 11/15/2020 FINAL  Final  CSF culture     Status: None   Collection Time: 11/14/20  3:45 AM   Specimen: CSF; Cerebrospinal Fluid  Result Value Ref Range Status   Specimen Description CSF  Final   Special Requests TUBE 2  Final   Gram Stain   Final    CYTOSPIN SMEAR WBC PRESENT, PREDOMINANTLY MONONUCLEAR NO ORGANISMS SEEN    Culture   Final    NO GROWTH 3 DAYS Performed at [facility], [address], [place], [place] [address]    Report Status 11/17/2020 FINAL  Final  MRSA PCR Screening     Status: None   Collection Time: 11/14/20  5:01 PM   Specimen: Nasopharyngeal  Result Value Ref Range Status   MRSA by PCR NEGATIVE NEGATIVE Final    Comment:        The GeneXpert MRSA Assay (FDA approved for NASAL specimens only), is  one component of a comprehensive MRSA colonization surveillance program. It is not intended to diagnose MRSA infection nor to guide or monitor treatment for MRSA infections. Performed at [facility], [address], [place], [place] [address]   MRSA PCR Screening     Status: None   Collection Time: 11/17/20  7:42 AM   Specimen: Nasopharyngeal  Result Value Ref Range Status   MRSA by PCR NEGATIVE NEGATIVE Final    Comment:        The GeneXpert MRSA Assay (FDA approved for  NASAL specimens only), is one component of a comprehensive MRSA colonization surveillance program. It is not intended to diagnose MRSA infection nor to guide or monitor treatment for MRSA infections. Performed at [facility], [address] [place], [place] [address]   Culture, blood (routine x 2)     Status: None (Preliminary result)   Collection Time: 11/17/20  9:29 AM   Specimen: BLOOD LEFT HAND  Result Value Ref Range Status   Specimen Description BLOOD LEFT HAND  Final   Special Requests   Final    BOTTLES DRAWN AEROBIC AND ANAEROBIC Blood Culture adequate volume   Culture   Final    NO GROWTH 1 DAY Performed at [facility], [address] [place], [place] [address]    Report Status PENDING  Incomplete  Culture, blood (routine x 2)     Status: None (Preliminary result)   Collection Time: 11/17/20  9:42 AM   Specimen: BLOOD  Result Value Ref Range Status   Specimen Description BLOOD LEFT ANTECUBITAL  Final   Special Requests   Final    BOTTLES DRAWN AEROBIC AND ANAEROBIC Blood Culture adequate volume   Culture   Final    NO GROWTH 1 DAY Performed at [facility], [address] [place], [place] [address]    Report Status PENDING  Incomplete    Radiology Reports CT HEAD WO CONTRAST  Result Date: 11/14/2020 CLINICAL DATA:  70 year old male positive COVID-19. Intubated. Altered mental status. EXAM: CT HEAD WITHOUT CONTRAST TECHNIQUE: Contiguous axial images were  obtained from the base of the skull through the vertex without intravenous contrast. COMPARISON:  Brain MRI 09/02/2020.  Head CT 10/08/2019. and earlier. FINDINGS: Brain: Chronic encephalomalacia right operculum and insula. Some generalized cerebral volume loss since 2020. Gray-white matter differentiation elsewhere within normal limits. No midline shift, ventriculomegaly, mass effect, evidence of mass lesion, intracranial hemorrhage or evidence of cortically based acute infarction. Vascular: Calcified atherosclerosis at the skull base. No suspicious intracranial vascular hyperdensity. Skull: No acute osseous abnormality identified. Sinuses/Orbits: Visualized paranasal sinuses and mastoids are stable and well pneumatized. Other: No acute orbit or scalp soft tissue finding. IMPRESSION: Chronic right MCA infarct. No acute intracranial abnormality identified. Electronically Signed   By: Odessa FlemingH  Hall M.D.   On: 11/14/2020 07:13   DG Chest Port 1 View  Result Date: 11/17/2020 CLINICAL DATA:  Shortness of breath.  COVID pneumonia. EXAM: PORTABLE CHEST 1 VIEW COMPARISON:  11/15/2020. FINDINGS: Interim removal of endotracheal tube and NG tube. Interim placement of feeding tube, its tip is below left hemidiaphragm. Right IJ line in stable position. Heart size normal. Persistent mild bilateral interstitial prominence. No focal alveolar infiltrate. No pleural effusion or pneumothorax. Prominent skin folds noted on the left. No acute bony abnormality. IMPRESSION: 1. Interim removal of endotracheal tube and NG tube. Interim placement of feeding tube, its tip is below left hemidiaphragm. Right IJ line in stable position. 2. Persistent mild bilateral interstitial prominence. Electronically Signed   By: Maisie Fushomas  Register   On: 11/17/2020 08:32   DG Chest Port 1 View  Result Date: 11/15/2020 CLINICAL DATA:  COVID pneumonia. EXAM: PORTABLE CHEST 1 VIEW COMPARISON:  11/14/2020. FINDINGS: Endotracheal tube, NG tube, right IJ line in  stable position. Heart size normal. Persistent mild bilateral interstitial infiltrates. Interim improvement from prior exam. No pleural effusion or pneumothorax. IMPRESSION: 1. Lines and tubes in stable position. 2. Persistent mild bilateral interstitial infiltrates, improved from prior exam. Electronically Signed  ByMaisie Fus  Register   On: 11/15/2020 05:18   DG Chest Port 1 View  Result Date: 11/14/2020 CLINICAL DATA:  Encounter for central line placement. EXAM: PORTABLE CHEST 1 VIEW COMPARISON:  November 14, 2018 a FINDINGS: There is a central venous catheter with tip terminating near the cavoatrial junction. The endotracheal tube terminates above the carina. There is a enteric tube extends below the left hemidiaphragm. There is a worsening right lower lobe and right infrahilar airspace opacity. There is a developing retrocardiac airspace opacity. There is no pneumothorax. There is mild interstitial edema with a probable small left-sided effusion. There is no pneumothorax. IMPRESSION: 1. New central venous catheter as above.  No pneumothorax. 2. Worsening bilateral airspace opacities, right greater than left, concerning for multifocal pneumonia. 3. Persistent mild interstitial edema with a probable small left-sided effusion. Electronically Signed   By: Katherine Mantle M.D.   On: 11/14/2020 02:24   DG Chest Port 1 View  Result Date: 11/14/2020 CLINICAL DATA:  Questionable sepsis, OG tube placement EXAM: PORTABLE CHEST 1 VIEW COMPARISON:  08/31/2020 FINDINGS: *Endotracheal tube tip terminates low in the trachea, 2.3 cm from the carina. Recommend retraction 2 cm to the mid trachea. *Transesophageal tube tip and side port are distal to the GE junction, terminating in the gastric body. *Telemetry leads and external support devices overlie the chest. Diffuse airways thickening and coarsened interstitial changes. More patchy right infrahilar opacity could reflect atelectasis and vascular crowding though early  consolidation could have a similar appearance. No convincing features of edema. No pneumothorax or visible effusion. The aorta is calcified. The remaining cardiomediastinal contours are unremarkable. No acute osseous or soft tissue abnormality. Degenerative changes are present in the imaged spine and shoulders. IMPRESSION: 1. Endotracheal tube tip terminates low in the trachea, 2.3 cm from the carina. Recommend retraction 2 cm to the mid trachea. 2. Transesophageal tube tip and side port are distal to the GE junction, terminating in the gastric body. 3. Some diffuse airways thickening and coarsened interstitial changes could reflect a bronchitis/bronchiolitis in the appropriate clinical setting with more patchy opacity in the right infrahilar lung possibly atelectatic or early consolidation Electronically Signed   By: Kreg Shropshire M.D.   On: 11/14/2020 00:13   DG Abd Portable 1V  Result Date: 11/16/2020 CLINICAL DATA:  Encounter for feeding tube placement EXAM: PORTABLE ABDOMEN - 1 VIEW COMPARISON:  Portable exam 1452 hours compared to 08/15/2009 FINDINGS: Tip of feeding tube projects over distal gastric antrum or pylorus. Nonobstructive bowel gas pattern. No bowel dilatation or bowel wall thickening. Osseous structures unremarkable. IMPRESSION: Tip of feeding tube projects over distal gastric antrum or pylorus. Electronically Signed   By: Ulyses Southward M.D.   On: 11/16/2020 15:01   EEG adult  Result Date: 11/18/2020 Charlsie Quest, MD     11/18/2020  1:10 PM Patient Name: ARVIND MEXICANO MRN: [id number] Epilepsy Attending: Charlsie Quest Referring Physician/Provider: Dr. Susa Raring Date: 11/18/2020 Duration: 26.04 mins Patient history: 70 year old male with altered mental status.  EEG to evaluate for seizures. Level of alertness: Awake AEDs during EEG study: Depakote Technical aspects: This EEG study was done with scalp electrodes positioned according to the 10-20 International system of electrode  placement. Electrical activity was acquired at a sampling rate of 500Hz  and reviewed with a high frequency filter of 70Hz  and a low frequency filter of 1Hz . EEG data were recorded continuously and digitally stored. Description: The posterior dominant rhythm consists of 8-9 Hz activity of moderate  voltage (25-35 uV) seen predominantly in posterior head regions, symmetric and reactive to eye opening and eye closing.  EEG showed continuous 3 to 5 Hz theta-delta slowing. Hyperventilation and photic stimulation were not performed.   ABNORMALITY - Continuous slow, generalized IMPRESSION: This study is suggestive of mild to moderate diffuse encephalopathy, nonspecific etiology. No seizures or epileptiform discharges were seen throughout the recording. Charlsie Quest   ECHOCARDIOGRAM COMPLETE  Result Date: 11/14/2020    ECHOCARDIOGRAM REPORT   Patient Name:   GAYLORD SEYDEL [facility] Date of Exam: 11/14/2020 Medical Rec #:  [id number]     Height:       66.0 in Accession #:    [id number]    Weight:       160.7 lb Date of Birth:  01-07-1951     BSA:          1.822 m Patient Age:    70 years      BP:           101/64 mmHg Patient Gender: M             HR:           76 bpm. Exam Location:  Inpatient Procedure: 2D Echo, Color Doppler and Cardiac Doppler Indications:    Dyspnea R06.00  History:        Patient has prior history of Echocardiogram examinations, most                 recent 09/01/2020. Signs/Symptoms:Dyspnea; Risk                 Factors:Hypertension.  Sonographer:    Eulah Pont RDCS Referring Phys: [id number] DOUGLAS B MCQUAID  Sonographer Comments: Technically difficult study due to poor echo windows. IMPRESSIONS  1. Left ventricular ejection fraction, by estimation, is 60 to 65%. The left ventricle has normal function. Left ventricular endocardial border not optimally defined to evaluate regional wall motion. Left ventricular diastolic parameters are indeterminate.  2. Right ventricular systolic function is mildly reduced.  The right ventricular size is mildly enlarged. There is normal pulmonary artery systolic pressure. The estimated right ventricular systolic pressure is 27.6 mmHg.  3. The mitral valve is grossly normal. Trivial mitral valve regurgitation. No evidence of mitral stenosis.  4. The aortic valve is abnormal, but not well visualized. There is moderate calcification of the aortic valve. Aortic valve regurgitation is mild. Mild aortic valve sclerosis is present, with no evidence of aortic valve stenosis.  5. The inferior vena cava is normal in size with greater than 50% respiratory variability, suggesting right atrial pressure of 3 mmHg. FINDINGS  Left Ventricle: Left ventricular ejection fraction, by estimation, is 60 to 65%. The left ventricle has normal function. Left ventricular endocardial border not optimally defined to evaluate regional wall motion. The left ventricular internal cavity size was normal in size. There is no left ventricular hypertrophy. Left ventricular diastolic parameters are indeterminate. Right Ventricle: The right ventricular size is mildly enlarged. Right vetricular wall thickness was not well visualized. Right ventricular systolic function is mildly reduced. There is normal pulmonary artery systolic pressure. The tricuspid regurgitant velocity is 2.48 m/s, and with an assumed right atrial pressure of 3 mmHg, the estimated right ventricular systolic pressure is 27.6 mmHg. Left Atrium: Left atrial size was normal in size. Right Atrium: Right atrial size was normal in size. Pericardium: The pericardium was not well visualized. Mitral Valve: The mitral valve is grossly normal. Trivial mitral valve regurgitation. No evidence of mitral  valve stenosis. Tricuspid Valve: The tricuspid valve is normal in structure. Tricuspid valve regurgitation is mild. Aortic Valve: The aortic valve is abnormal. There is moderate calcification of the aortic valve. Aortic valve regurgitation is mild. Aortic regurgitation  PHT measures 404 msec. Mild aortic valve sclerosis is present, with no evidence of aortic valve stenosis. Aortic valve mean gradient measures 9.0 mmHg. Aortic valve peak gradient measures 14.4 mmHg. Aortic valve area, by VTI measures 1.63 cm. Pulmonic Valve: The pulmonic valve was not well visualized. Pulmonic valve regurgitation is not visualized. Aorta: The aortic root is normal in size and structure. Venous: The inferior vena cava is normal in size with greater than 50% respiratory variability, suggesting right atrial pressure of 3 mmHg. IAS/Shunts: The interatrial septum was not well visualized.  LEFT VENTRICLE PLAX 2D LVIDd:         4.60 cm  Diastology LVIDs:         3.10 cm  LV e' medial:    5.43 cm/s LV PW:         0.70 cm  LV E/e' medial:  14.2 LV IVS:        0.90 cm  LV e' lateral:   7.62 cm/s LVOT diam:     1.70 cm  LV E/e' lateral: 10.1 LV SV:         58 LV SV Index:   32 LVOT Area:     2.27 cm  RIGHT VENTRICLE RV S prime:     12.30 cm/s TAPSE (M-mode): 1.7 cm LEFT ATRIUM             Index       RIGHT ATRIUM           Index LA diam:        3.00 cm 1.65 cm/m  RA Area:     14.50 cm LA Vol (A2C):   20.8 ml 11.41 ml/m RA Volume:   39.40 ml  21.62 ml/m LA Vol (A4C):   22.4 ml 12.29 ml/m LA Biplane Vol: 21.6 ml 11.85 ml/m  AORTIC VALVE AV Area (Vmax):    1.82 cm AV Area (Vmean):   1.77 cm AV Area (VTI):     1.63 cm AV Vmax:           190.00 cm/s AV Vmean:          142.000 cm/s AV VTI:            0.357 m AV Peak Grad:      14.4 mmHg AV Mean Grad:      9.0 mmHg LVOT Vmax:         152.00 cm/s LVOT Vmean:        111.000 cm/s LVOT VTI:          0.257 m LVOT/AV VTI ratio: 0.72 AI PHT:            404 msec  AORTA Ao Root diam: 3.20 cm Ao Asc diam:  3.10 cm MITRAL VALVE               TRICUSPID VALVE MV Area (PHT): 2.99 cm    TR Peak grad:   24.6 mmHg MV Decel Time: 254 msec    TR Vmax:        248.00 cm/s MV E velocity: 77.10 cm/s MV A velocity: 81.00 cm/s  SHUNTS MV E/A ratio:  0.95        Systemic VTI:   0.26 m  Systemic Diam: 1.70 cm Weston Brass MD Electronically signed by Weston Brass MD Signature Date/Time: 11/14/2020/1:19:33 PM    Final

## 2020-11-29 NOTE — Discharge Summary (Signed)
[facility] Death Note                                                                                                                                                                                               Bryan Davies, is a 70 y.o. male, DOB - 12/10/1950, ZOX:[id number]  Admit date - 12/05/20   Admitting Physician Kirtland Bouchard, MD  Outpatient Primary MD for the patient is [facility]  LOS - 5  No chief complaint on file.      Notification: [facility] notified of death of 2020-12-12   Date and Time of Death -  11-Dec-2020 @ 1634  Pronounced by - RN  History of present illness:   Bryan Davies is a 70 y.o. male with a history of - tar dive dyskinesia, right MCA stroke, bipolar disorder, ongoing smoking, dyslipidemia, CAD on dual antiplatelet treatment, hypertension, who has been bedbound for few months and currently residing in an assisted living facility, presented from assisted living facility after being sick for a few days according to family and friends with fever, shortness of breath, in the ER diagnosed with acute hypoxic respiratory failure due to combination of COVID-19 pneumonia and aspiration pneumonia, sepsis, shock, required intubation and admission to the ICU.  He was stabilized extubated and transferred to hospitalist service on 11/17/2020.  Despite appropriate treatment he remained obtunded and extremely sick, he was transitioned to full comfort care on morphine drip and 24 hours later he passed away comfortably.   Final Diagnoses:  Cause if death - Covid 19 infection, aspiration pneumonia  Signature  Susa Raring M.D on 12-12-20 at 7:00 AM  [facility]  Office Phone -[phone]  Total clinical and documentation time for today Under 30 minutes   Last Note                                                                     PROGRESS NOTE  Patient Demographics:    Bryan Davies, is a 70 y.o. male, DOB - January 01, 1951, ONG:[id number]  Outpatient Primary MD for the patient is [facility]    LOS - 5  Admit date - November 19, 2020    CC - Fever     Brief Narrative (HPI from H&P)   70 y/o male with underlying history of tar dive dyskinesia, right MCA stroke, bipolar disorder, ongoing smoking, dyslipidemia, CAD on dual antiplatelet treatment, hypertension, who has been bedbound for few months and currently residing in an assisted living facility, presented from assisted living facility after being sick for a few days according to family and friends with fever, shortness of breath, in the ER diagnosed with acute hypoxic respiratory failure due to combination of COVID-19 pneumonia and aspiration pneumonia, sepsis, shock, required intubation and admission to the ICU.  He was stabilized extubated and transferred to hospitalist service on 11/17/2020.  Vaccination status is unknown.    Subjective:     Bryan Davies today in bed, remains unresponsive, on morphine drip and full comfort measures   Assessment  & Plan :   He is now on morphine drip with full comfort measures, palliative care on board, expect him to pass away soon.  Now only on comfort meds.  Other active medical issues addressed earlier this admission are below.      1. Acute Hypoxic Resp. Failure due to Acute Covid 19 Viral Pneumonitis along with aspiration pneumonia  - his immunization status is unknown, he was intubated and was in the ICU for the first few days, he was treated in ICU with IV steroids, Remdesivir along with Unasyn for aspiration pneumonia.  He has been extubated, hypoxia has improved.  Continue supportive care.  Overall quality of life poor and long-term prognosis is  guarded.  Encouraged the patient to sit up in chair in the daytime use I-S and flutter valve for pulmonary toiletry and then prone in bed when at night.  Will advance activity and titrate down oxygen as possible.  ASpO2: (!) 86 % O2 Flow Rate (L/min): 0 L/min FiO2 (%): 40 %  Recent Labs  Lab 10/29/2020 2349 11/14/20 0000 11/14/20 0012 11/14/20 0157 11/14/20 0420 11/14/20 0618 11/14/20 1138 11/14/20 1139 11/14/20 1150 11/15/20 0328 11/16/20 0322 11/17/20 0421 11/17/20 0929 11/18/20 0337  WBC 9.0  --   --   --   --   --   --   --   --  7.9 10.5 6.8  --  7.4  HGB 11.9*  --   --    < >  --  9.5*  --   --   --  10.3* 11.8* 9.6*  --  9.0*  HCT 42.0  --   --    < >  --  28.0*  --   --   --  34.4* 37.8* 31.3*  --  29.2*  PLT 125*  --   --   --   --   --   --   --   --  93* 111* 86*  --  86*  88*  CRP  --   --   --   --   --   --   --   --   --   --   --   --  0.9 0.7  BNP  --   --   --   --   --   --   --   --   --   --   --   --  113.9* 79.7  DDIMER  --   --   --   --   --   --   --   --   --   --   --   --  1.52* 1.39*  1.31*  PROCALCITON  --   --   --   --  14.84  --   --   --   --  17.71 9.44  --  3.41 2.05  AST 32  --   --   --   --   --  33 34  --  47*  --   --   --  86*  ALT 18  --   --   --   --   --  19 20  --  25  --   --   --  52*  ALKPHOS 35*  --   --   --   --   --  32* 31*  --  71  --   --   --  45  BILITOT 0.3  --   --   --   --   --  0.4 0.5  --  0.3  --   --   --  0.6  ALBUMIN 2.2*  --   --   --   --   --  2.0* 2.1*  --  2.0*  --   --   --  1.9*  INR 1.4*  --   --   --   --   --   --   --   --   --   --   --   --  1.3*  1.3*  LATICACIDVEN 1.2 4.0*  --   --   --   --   --   --  1.5  --   --   --   --   --   SARSCOV2NAA  --   --  POSITIVE*  --   --   --   --   --   --   --   --   --   --   --    < > = values in this interval not displayed.    2. Severe metabolic encephalopathy - caused by sepsis & shock due to COVID-19 infection and aspiration pneumonia, head CT  nonacute with evidence of old right MCA stroke, has underlying tar dive dyskinesia.  Neurontin, hold other sedating.  Unremarkable CSF on LP done in ICU. Pending EEG.  3. History of right MCA stroke.  Appears to be on dual antiplatelet treatment which will be continued via NG tube if possible, continue statin via NG tube for secondary prevention as well.  It appears that he has been bedbound for the last few months.  4.  Underlying tardive dyskinesia.  Continue Depakote IV, due to encephalopathy and dehydration hold Neurontin.  5. Severe dehydration with hypernatremia.  Improved with IV D5W will continue, continue free water flushes via NG tube.  6. Moderately elevated D-dimer.  Likely due to inflammation from COVID-19 and dehydration.  Moderate dose Lovenox.  Stable DIC panel.  7. Non-ACS pattern stress induced demand mismatch causing troponin rise.  Does have underlying history of CAD.  For now continue dual antiplatelet and statin for secondary prevention, echo shows preserved EF without any noted wall motion abnormality, EKG nonacute.  Continue to monitor.      Condition - Extremely Guarded  Family Communication  :   Primary  decision maker Selena Lesser on 11/25/2020 phone #[phone] - DNR, gentle medical treatment, if declines focus on comfort.  significant other Alcario Drought [phone] on 11/17/2020    Code Status :  DNR  Consults  :  PCCM, Pall.Care  Procedures  :    1/17 lumbar puncture > WBC 4  Intubated 1/17 x 2 days  Right IJ central line placed in ICU  CT head.  Old right MCA stroke  TTE - 1. Left ventricular ejection fraction, by estimation, is 60 to 65%. The left ventricle has normal function. Left ventricular endocardial border not optimally defined to evaluate regional wall motion. Left ventricular diastolic parameters are indeterminate.  2. Right ventricular systolic function is mildly reduced. The right ventricular size is mildly enlarged. There is normal pulmonary  artery systolic pressure. The estimated right ventricular systolic pressure is 27.6 mmHg.  3. The mitral valve is grossly normal. Trivial mitral valve regurgitation. No evidence of mitral stenosis.  4. The aortic valve is abnormal, but not well visualized. There is moderate calcification of the aortic valve. Aortic valve regurgitation is mild. Mild aortic valve sclerosis is present, with no evidence of aortic valve stenosis.  5. The inferior vena cava is normal in size with greater than 50% respiratory variability, suggesting right atrial pressure of 3 mmHg    PUD Prophylaxis : PPI  Disposition Plan  :    Status is: Inpatient  Remains inpatient appropriate because:IV treatments appropriate due to intensity of illness or inability to take PO   Dispo: The patient is from: Home              Anticipated d/c is to: Home              Anticipated d/c date is: > 3 days              Patient currently is not medically stable to d/c.   DVT Prophylaxis  :  Lovenox   Lab Results  Component Value Date   PLT 88 (L) 11/18/2020   PLT 86 (L) 11/18/2020    Diet :  Diet Order    None       Inpatient Medications  Scheduled Meds: . haloperidol decanoate  100 mg Intramuscular Q30 days   Continuous Infusions:  PRN Meds:.  Antibiotics  :    Anti-infectives (From admission, onward)   Start     Dose/Rate Route Frequency Ordered Stop   11/16/20 0600  vancomycin (VANCOCIN) IVPB 1000 mg/200 mL premix  Status:  Discontinued        1,000 mg 200 mL/hr over 60 Minutes Intravenous Every 48 hours 11/14/20 0449 11/14/20 0959   11/16/20 0200  vancomycin (VANCOREADY) IVPB 1500 mg/300 mL  Status:  Discontinued        1,500 mg 150 mL/hr over 120 Minutes Intravenous Every 48 hours 11/14/20 0959 11/15/20 0903   11/15/20 1000  remdesivir 100 mg in sodium chloride 0.9 % 100 mL IVPB       "Followed by" Linked Group Details   100 mg 200 mL/hr over 30 Minutes Intravenous Daily 11/14/20 0409 11/18/20 1249    11/15/20 1000  ceFEPIme (MAXIPIME) 2 g in sodium chloride 0.9 % 100 mL IVPB  Status:  Discontinued        2 g 200 mL/hr over 30 Minutes Intravenous Every 12 hours 11/15/20 0746 11/15/20 0903   11/15/20 1000  Ampicillin-Sulbactam (UNASYN) 3 g in sodium chloride 0.9 % 100 mL IVPB  Status:  Discontinued  3 g 200 mL/hr over 30 Minutes Intravenous Every 8 hours 11/15/20 0903 11/18/20 1546   11/14/20 2200  ceFEPIme (MAXIPIME) 2 g in sodium chloride 0.9 % 100 mL IVPB  Status:  Discontinued        2 g 200 mL/hr over 30 Minutes Intravenous Daily at bedtime 11/14/20 0417 11/15/20 0746   11/14/20 0430  remdesivir 200 mg in sodium chloride 0.9% 250 mL IVPB       "Followed by" Linked Group Details   200 mg 580 mL/hr over 30 Minutes Intravenous Once 11/14/20 0409 11/14/20 0854   11/14/20 0030  vancomycin (VANCOREADY) IVPB 1500 mg/300 mL        1,500 mg 150 mL/hr over 120 Minutes Intravenous  Once November 26, 2020 2353 11/14/20 0423   11/14/20 0000  ceFEPIme (MAXIPIME) 2 g in sodium chloride 0.9 % 100 mL IVPB        2 g 200 mL/hr over 30 Minutes Intravenous  Once 26-Nov-2020 2350 11/14/20 0154   11/14/20 0000  metroNIDAZOLE (FLAGYL) IVPB 500 mg        500 mg 100 mL/hr over 60 Minutes Intravenous  Once 2020-11-26 2350 11/14/20 0423   11/14/20 0000  vancomycin (VANCOCIN) IVPB 1000 mg/200 mL premix  Status:  Discontinued        1,000 mg 200 mL/hr over 60 Minutes Intravenous  Once Nov 26, 2020 2350 26-Nov-2020 2353       Time Spent in minutes  30   Susa Raring M.D on 11/20/2020 at 7:01 AM  To page go to [email or web address]   [facility] -  Office  [phone]    See all Orders from today for further details    Objective:   Vitals:   11/18/20 1531 11/18/20 2019 11/07/2020 0807 11/08/2020 1641  BP: 135/84 (!) 70/58 (!) 72/45   Pulse: (!) 149 96 (!) 105   Resp: (!) 37 (!) 8 (!) 8   Temp: (!) 101.3 F (38.5 C) 98 F (36.7 C)    TempSrc: Axillary Axillary    SpO2: 95% 90% (!) 86%   Weight:    63.6 kg   Height:    5\' 6"  (1.676 m)    Wt Readings from Last 3 Encounters:  11/05/2020 63.6 kg  09/04/20 72.9 kg  08/10/20 67.1 kg    No intake or output data in the 24 hours ending 11/20/20 0701   Physical Exam  Patient in bed appears to be in no distress on morphine drip, obtunded.  Shallow breaths.  Upper airway gurgling.    Data Review:    CBC Recent Labs  Lab 11-26-2020 2349 11/14/20 0157 11/14/20 0618 11/15/20 0328 11/16/20 0322 11/17/20 0421 11/18/20 0337  WBC 9.0  --   --  7.9 10.5 6.8 7.4  HGB 11.9*   < > 9.5* 10.3* 11.8* 9.6* 9.0*  HCT 42.0   < > 28.0* 34.4* 37.8* 31.3* 29.2*  PLT 125*  --   --  93* 111* 86* 86*  88*  MCV 105.3*  --   --  99.1 95.0 96.3 95.7  MCH 29.8  --   --  29.7 29.6 29.5 29.5  MCHC 28.3*  --   --  29.9* 31.2 30.7 30.8  RDW 13.9  --   --  13.7 13.6 13.6 13.2  LYMPHSABS 0.5*  --   --   --  0.4*  --  0.6*  MONOABS 0.4  --   --   --  0.2  --  0.3  EOSABS 0.0  --   --   --  0.0  --  0.0  BASOSABS 0.0  --   --   --  0.0  --  0.0   < > = values in this interval not displayed.    Recent Labs  Lab 03-Mar-2021 2349 11/14/20 0000 11/14/20 0157 11/14/20 0420 11/14/20 0618 11/14/20 1138 11/14/20 1139 11/14/20 1150 11/14/20 1151 11/14/20 1400 11/15/20 0328 11/16/20 0322 11/16/20 2057 11/17/20 0421 11/17/20 0929 11/17/20 1606 11/18/20 0337  NA 162*  --    < > 160*   < >  --   --   --   --    < > 155* 156*  --  159* 158*  --  151*  K 3.9  --    < > 3.5   < >  --   --   --   --    < > 3.6 3.5  --  3.1* 3.2*  --  3.4*  CL 128*  --    < > 127*  --   --   --   --   --    < > 127* 125*  --  122* 123*  --  116*  CO2 19*  --   --  19*  --   --   --   --   --    < > 17* 20*  --  27 25  --  26  GLUCOSE 196*  --    < > 236*  --   --   --   --   --    < > 215* 128*  --  164* 156*  --  254*  BUN 81*  --    < > 76*  --   --   --   --   --    < > 68* 61*  --  45* 41*  --  36*  CREATININE 2.93*  --    < > 2.38*  --   --   --   --   --    < > 1.21 1.20  --   0.90 0.91  --  0.92  CALCIUM 7.9*  --   --  7.3*  --   --   --   --   --    < > 7.8* 8.2*  --  7.9* 7.7*  --  7.3*  AST 32  --   --   --   --  33 34  --   --   --  47*  --   --   --   --   --  86*  ALT 18  --   --   --   --  19 20  --   --   --  25  --   --   --   --   --  52*  ALKPHOS 35*  --   --   --   --  32* 31*  --   --   --  71  --   --   --   --   --  45  BILITOT 0.3  --   --   --   --  0.4 0.5  --   --   --  0.3  --   --   --   --   --  0.6  ALBUMIN 2.2*  --   --   --   --  2.0* 2.1*  --   --   --  2.0*  --   --   --   --   --  1.9*  MG  --   --    < > 2.0  --  2.0  --   --   --    < > 2.0 2.2 2.1 2.1  --  1.9 1.9  CRP  --   --   --   --   --   --   --   --   --   --   --   --   --   --  0.9  --  0.7  DDIMER  --   --   --   --   --   --   --   --   --   --   --   --   --   --  1.52*  --  1.39*  1.31*  PROCALCITON  --   --   --  14.84  --   --   --   --   --   --  17.71 9.44  --   --  3.41  --  2.05  LATICACIDVEN 1.2 4.0*  --   --   --   --   --  1.5  --   --   --   --   --   --   --   --   --   INR 1.4*  --   --   --   --   --   --   --   --   --   --   --   --   --   --   --  1.3*  1.3*  HGBA1C  --   --   --   --   --  5.7*  --   --   --   --   --   --   --   --   --   --   --   AMMONIA  --   --   --   --   --   --   --   --  37*  --   --   --   --   --   --   --   --   BNP  --   --   --   --   --   --   --   --   --   --   --   --   --   --  113.9*  --  79.7   < > = values in this interval not displayed.    ------------------------------------------------------------------------------------------------------------------ No results for input(s): CHOL, HDL, LDLCALC, TRIG, CHOLHDL, LDLDIRECT in the last 72 hours.  Lab Results  Component Value Date   HGBA1C 5.7 (H) 11/14/2020   ------------------------------------------------------------------------------------------------------------------ No results for input(s): TSH, T4TOTAL, T3FREE, THYROIDAB in the last 72 hours.  Invalid  input(s): FREET3  Cardiac Enzymes No results for input(s): CKMB, TROPONINI, MYOGLOBIN in the last 168 hours.  Invalid input(s): CK ------------------------------------------------------------------------------------------------------------------    Component Value Date/Time   BNP 79.7 11/18/2020 4166    Micro Results Recent Results (from the past 240 hour(s))  Blood Culture (routine x 2)     Status: None   Collection Time: 11/14/20 12:00 AM   Specimen: BLOOD  Result Value Ref Range Status   Specimen Description BLOOD RIGHT ANTECUBITAL  Final   Special Requests  Final    BOTTLES DRAWN AEROBIC AND ANAEROBIC Blood Culture results may not be optimal due to an inadequate volume of blood received in culture bottles   Culture   Final    NO GROWTH 5 DAYS Performed at [facility], [address], [place], [place] [address]    Report Status 12-16-20 FINAL  Final  Blood Culture (routine x 2)     Status: None   Collection Time: 11/14/20 12:00 AM   Specimen: BLOOD LEFT ARM  Result Value Ref Range Status   Specimen Description BLOOD LEFT ARM  Final   Special Requests   Final    BOTTLES DRAWN AEROBIC AND ANAEROBIC Blood Culture results may not be optimal due to an inadequate volume of blood received in culture bottles   Culture   Final    NO GROWTH 5 DAYS Performed at [facility], [address], [place], [place] [address]    Report Status 12-16-20 FINAL  Final  Resp Panel by RT-PCR (Flu A&B, Covid) Nasopharyngeal Swab     Status: Abnormal   Collection Time: 11/14/20 12:12 AM   Specimen: Nasopharyngeal Swab; Nasopharyngeal(NP) swabs in vial transport medium  Result Value Ref Range Status   SARS Coronavirus 2 by RT PCR POSITIVE (A) NEGATIVE Final    Comment: RESULT CALLED TO, READ BACK BY AND VERIFIED WITH: G. TATE,RN 0116 11/14/2020 T. TYSOR (NOTE) SARS-CoV-2 target nucleic acids are DETECTED.  The SARS-CoV-2 RNA is generally detectable in upper  respiratory specimens during the acute phase of infection. Positive results are indicative of the presence of the identified virus, but do not rule out bacterial infection or co-infection with other pathogens not detected by the test. Clinical correlation with patient history and other diagnostic information is necessary to determine patient infection status. The expected result is Negative.  Fact Sheet for Patients: BloggerCourse.com  Fact Sheet for Healthcare Providers: SeriousBroker.it  This test is not yet approved or cleared by the [place] FDA and  has been authorized for detection and/or diagnosis of SARS-CoV-2 by FDA under an Emergency Use Authorization (EUA).  This EUA will remain in effect (meaning this test can  be used) for the duration of  the COVID-19 declaration under Section 564(b)(1) of the Act, 21 U.S.C. section 360bbb-3(b)(1), unless the authorization is terminated or revoked sooner.     Influenza A by PCR NEGATIVE NEGATIVE Final   Influenza B by PCR NEGATIVE NEGATIVE Final    Comment: (NOTE) The Xpert Xpress SARS-CoV-2/FLU/RSV plus assay is intended as an aid in the diagnosis of influenza from Nasopharyngeal swab specimens and should not be used as a sole basis for treatment. Nasal washings and aspirates are unacceptable for Xpert Xpress SARS-CoV-2/FLU/RSV testing.  Fact Sheet for Patients: BloggerCourse.com  Fact Sheet for Healthcare Providers: SeriousBroker.it  This test is not yet approved or cleared by the [place] FDA and has been authorized for detection and/or diagnosis of SARS-CoV-2 by FDA under an Emergency Use Authorization (EUA). This EUA will remain in effect (meaning this test can be used) for the duration of the COVID-19 declaration under Section 564(b)(1) of the Act, 21 U.S.C. section 360bbb-3(b)(1), unless the authorization is  terminated or revoked.  Performed at [facility], [address] [place], [place] [address]   Urine culture     Status: None   Collection Time: 11/14/20 12:44 AM   Specimen: Urine, Catheterized  Result Value Ref Range Status   Specimen Description URINE, CATHETERIZED  Final  Special Requests NONE  Final   Culture   Final    NO GROWTH Performed at [facility], [address] [place], [place] [address]    Report Status 11/15/2020 FINAL  Final  CSF culture     Status: None   Collection Time: 11/14/20  3:45 AM   Specimen: CSF; Cerebrospinal Fluid  Result Value Ref Range Status   Specimen Description CSF  Final   Special Requests TUBE 2  Final   Gram Stain   Final    CYTOSPIN SMEAR WBC PRESENT, PREDOMINANTLY MONONUCLEAR NO ORGANISMS SEEN    Culture   Final    NO GROWTH 3 DAYS Performed at [facility], [address], [place], [place] [address]    Report Status 11/17/2020 FINAL  Final  MRSA PCR Screening     Status: None   Collection Time: 11/14/20  5:01 PM   Specimen: Nasopharyngeal  Result Value Ref Range Status   MRSA by PCR NEGATIVE NEGATIVE Final    Comment:        The GeneXpert MRSA Assay (FDA approved for NASAL specimens only), is one component of a comprehensive MRSA colonization surveillance program. It is not intended to diagnose MRSA infection nor to guide or monitor treatment for MRSA infections. Performed at [facility], [address] [place], [place] [address]   MRSA PCR Screening     Status: None   Collection Time: 11/17/20  7:42 AM   Specimen: Nasopharyngeal  Result Value Ref Range Status   MRSA by PCR NEGATIVE NEGATIVE Final    Comment:        The GeneXpert MRSA Assay (FDA approved for NASAL specimens only), is one component of a comprehensive MRSA colonization surveillance program. It is not intended to diagnose MRSA infection nor to guide or monitor treatment for MRSA infections. Performed at [facility], [address], [place], [place] [address]   Culture, blood (routine x 2)     Status: None (Preliminary result)   Collection Time: 11/17/20  9:29 AM   Specimen: BLOOD LEFT HAND  Result Value Ref Range Status   Specimen Description BLOOD LEFT HAND  Final   Special Requests   Final    BOTTLES DRAWN AEROBIC AND ANAEROBIC Blood Culture adequate volume   Culture   Final    NO GROWTH 2 DAYS Performed at [facility], [address], [place], [place] [address]    Report Status PENDING  Incomplete  Culture, blood (routine x 2)     Status: None (Preliminary result)   Collection Time: 11/17/20  9:42 AM   Specimen: BLOOD  Result Value Ref Range Status   Specimen Description BLOOD LEFT ANTECUBITAL  Final   Special Requests   Final    BOTTLES DRAWN AEROBIC AND ANAEROBIC Blood Culture adequate volume   Culture   Final    NO GROWTH 2 DAYS Performed at [facility], [address], [place], [place] [address]    Report Status PENDING  Incomplete    Radiology Reports CT HEAD WO CONTRAST  Result Date: 11/14/2020 CLINICAL DATA:  70 year old male positive COVID-19. Intubated. Altered mental status. EXAM: CT HEAD WITHOUT CONTRAST TECHNIQUE: Contiguous axial images were obtained from the base of the skull through the vertex without intravenous contrast. COMPARISON:  Brain MRI 09/02/2020.  Head CT 10/08/2019. and earlier. FINDINGS: Brain: Chronic encephalomalacia right operculum and insula. Some generalized cerebral volume loss since 2020. Gray-white matter differentiation  elsewhere within normal limits. No midline shift, ventriculomegaly, mass effect, evidence of mass lesion, intracranial hemorrhage or evidence of cortically based acute infarction. Vascular: Calcified atherosclerosis at the skull base. No suspicious intracranial vascular hyperdensity. Skull: No acute osseous abnormality identified. Sinuses/Orbits: Visualized paranasal sinuses and mastoids are stable and well pneumatized.  Other: No acute orbit or scalp soft tissue finding. IMPRESSION: Chronic right MCA infarct. No acute intracranial abnormality identified. Electronically Signed   By: Odessa Fleming M.D.   On: 11/14/2020 07:13   DG Chest Port 1 View  Result Date: 11/17/2020 CLINICAL DATA:  Shortness of breath.  COVID pneumonia. EXAM: PORTABLE CHEST 1 VIEW COMPARISON:  11/15/2020. FINDINGS: Interim removal of endotracheal tube and NG tube. Interim placement of feeding tube, its tip is below left hemidiaphragm. Right IJ line in stable position. Heart size normal. Persistent mild bilateral interstitial prominence. No focal alveolar infiltrate. No pleural effusion or pneumothorax. Prominent skin folds noted on the left. No acute bony abnormality. IMPRESSION: 1. Interim removal of endotracheal tube and NG tube. Interim placement of feeding tube, its tip is below left hemidiaphragm. Right IJ line in stable position. 2. Persistent mild bilateral interstitial prominence. Electronically Signed   By: Maisie Fus  Register   On: 11/17/2020 08:32   DG Chest Port 1 View  Result Date: 11/15/2020 CLINICAL DATA:  COVID pneumonia. EXAM: PORTABLE CHEST 1 VIEW COMPARISON:  11/14/2020. FINDINGS: Endotracheal tube, NG tube, right IJ line in stable position. Heart size normal. Persistent mild bilateral interstitial infiltrates. Interim improvement from prior exam. No pleural effusion or pneumothorax. IMPRESSION: 1. Lines and tubes in stable position. 2. Persistent mild bilateral interstitial infiltrates, improved from prior exam. Electronically Signed   By: Maisie Fus  Register   On: 11/15/2020 05:18   DG Chest Port 1 View  Result Date: 11/14/2020 CLINICAL DATA:  Encounter for central line placement. EXAM: PORTABLE CHEST 1 VIEW COMPARISON:  November 14, 2018 a FINDINGS: There is a central venous catheter with tip terminating near the cavoatrial junction. The endotracheal tube terminates above the carina. There is a enteric tube extends below the left  hemidiaphragm. There is a worsening right lower lobe and right infrahilar airspace opacity. There is a developing retrocardiac airspace opacity. There is no pneumothorax. There is mild interstitial edema with a probable small left-sided effusion. There is no pneumothorax. IMPRESSION: 1. New central venous catheter as above.  No pneumothorax. 2. Worsening bilateral airspace opacities, right greater than left, concerning for multifocal pneumonia. 3. Persistent mild interstitial edema with a probable small left-sided effusion. Electronically Signed   By: Katherine Mantle M.D.   On: 11/14/2020 02:24   DG Chest Port 1 View  Result Date: 11/14/2020 CLINICAL DATA:  Questionable sepsis, OG tube placement EXAM: PORTABLE CHEST 1 VIEW COMPARISON:  08/31/2020 FINDINGS: *Endotracheal tube tip terminates low in the trachea, 2.3 cm from the carina. Recommend retraction 2 cm to the mid trachea. *Transesophageal tube tip and side port are distal to the GE junction, terminating in the gastric body. *Telemetry leads and external support devices overlie the chest. Diffuse airways thickening and coarsened interstitial changes. More patchy right infrahilar opacity could reflect atelectasis and vascular crowding though early consolidation could have a similar appearance. No convincing features of edema. No pneumothorax or visible effusion. The aorta is calcified. The remaining cardiomediastinal contours are unremarkable. No acute osseous or soft tissue abnormality. Degenerative changes are present in the imaged spine and shoulders. IMPRESSION: 1. Endotracheal tube tip terminates low in the trachea, 2.3 cm from the carina.  Recommend retraction 2 cm to the mid trachea. 2. Transesophageal tube tip and side port are distal to the GE junction, terminating in the gastric body. 3. Some diffuse airways thickening and coarsened interstitial changes could reflect a bronchitis/bronchiolitis in the appropriate clinical setting with more patchy  opacity in the right infrahilar lung possibly atelectatic or early consolidation Electronically Signed   By: Kreg Shropshire M.D.   On: 11/14/2020 00:13   DG Abd Portable 1V  Result Date: 11/16/2020 CLINICAL DATA:  Encounter for feeding tube placement EXAM: PORTABLE ABDOMEN - 1 VIEW COMPARISON:  Portable exam 1452 hours compared to 08/15/2009 FINDINGS: Tip of feeding tube projects over distal gastric antrum or pylorus. Nonobstructive bowel gas pattern. No bowel dilatation or bowel wall thickening. Osseous structures unremarkable. IMPRESSION: Tip of feeding tube projects over distal gastric antrum or pylorus. Electronically Signed   By: Ulyses Southward M.D.   On: 11/16/2020 15:01   EEG adult  Result Date: 11/18/2020 Charlsie Quest, MD     11/18/2020  1:10 PM Patient Name: Bryan Davies MRN: [id number] Epilepsy Attending: Charlsie Quest Referring Physician/Provider: Dr. Susa Raring Date: 11/18/2020 Duration: 26.04 mins Patient history: 70 year old male with altered mental status.  EEG to evaluate for seizures. Level of alertness: Awake AEDs during EEG study: Depakote Technical aspects: This EEG study was done with scalp electrodes positioned according to the 10-20 International system of electrode placement. Electrical activity was acquired at a sampling rate of 500Hz  and reviewed with a high frequency filter of 70Hz  and a low frequency filter of 1Hz . EEG data were recorded continuously and digitally stored. Description: The posterior dominant rhythm consists of 8-9 Hz activity of moderate voltage (25-35 uV) seen predominantly in posterior head regions, symmetric and reactive to eye opening and eye closing.  EEG showed continuous 3 to 5 Hz theta-delta slowing. Hyperventilation and photic stimulation were not performed.   ABNORMALITY - Continuous slow, generalized IMPRESSION: This study is suggestive of mild to moderate diffuse encephalopathy, nonspecific etiology. No seizures or epileptiform discharges were seen  throughout the recording. Charlsie Quest   ECHOCARDIOGRAM COMPLETE  Result Date: 11/14/2020    ECHOCARDIOGRAM REPORT   Patient Name:   Bryan Davies [facility] Date of Exam: 11/14/2020 Medical Rec #:  [id number]     Height:       66.0 in Accession #:    [id number]    Weight:       160.7 lb Date of Birth:  [birth year]     BSA:          1.822 m Patient Age:    70 years      BP:           101/64 mmHg Patient Gender: M             HR:           76 bpm. Exam Location:  Inpatient Procedure: 2D Echo, Color Doppler and Cardiac Doppler Indications:    Dyspnea R06.00  History:        Patient has prior history of Echocardiogram examinations, most                 recent 09/01/2020. Signs/Symptoms:Dyspnea; Risk                 Factors:Hypertension.  Sonographer:    Eulah Pont RDCS Referring Phys: [phone] DOUGLAS B MCQUAID  Sonographer Comments: Technically difficult study due to poor echo windows. IMPRESSIONS  1. Left ventricular ejection fraction, by estimation,  is 60 to 65%. The left ventricle has normal function. Left ventricular endocardial border not optimally defined to evaluate regional wall motion. Left ventricular diastolic parameters are indeterminate.  2. Right ventricular systolic function is mildly reduced. The right ventricular size is mildly enlarged. There is normal pulmonary artery systolic pressure. The estimated right ventricular systolic pressure is 27.6 mmHg.  3. The mitral valve is grossly normal. Trivial mitral valve regurgitation. No evidence of mitral stenosis.  4. The aortic valve is abnormal, but not well visualized. There is moderate calcification of the aortic valve. Aortic valve regurgitation is mild. Mild aortic valve sclerosis is present, with no evidence of aortic valve stenosis.  5. The inferior vena cava is normal in size with greater than 50% respiratory variability, suggesting right atrial pressure of 3 mmHg. FINDINGS  Left Ventricle: Left ventricular ejection fraction, by estimation, is 60 to 65%.  The left ventricle has normal function. Left ventricular endocardial border not optimally defined to evaluate regional wall motion. The left ventricular internal cavity size was normal in size. There is no left ventricular hypertrophy. Left ventricular diastolic parameters are indeterminate. Right Ventricle: The right ventricular size is mildly enlarged. Right vetricular wall thickness was not well visualized. Right ventricular systolic function is mildly reduced. There is normal pulmonary artery systolic pressure. The tricuspid regurgitant velocity is 2.48 m/s, and with an assumed right atrial pressure of 3 mmHg, the estimated right ventricular systolic pressure is 27.6 mmHg. Left Atrium: Left atrial size was normal in size. Right Atrium: Right atrial size was normal in size. Pericardium: The pericardium was not well visualized. Mitral Valve: The mitral valve is grossly normal. Trivial mitral valve regurgitation. No evidence of mitral valve stenosis. Tricuspid Valve: The tricuspid valve is normal in structure. Tricuspid valve regurgitation is mild. Aortic Valve: The aortic valve is abnormal. There is moderate calcification of the aortic valve. Aortic valve regurgitation is mild. Aortic regurgitation PHT measures 404 msec. Mild aortic valve sclerosis is present, with no evidence of aortic valve stenosis. Aortic valve mean gradient measures 9.0 mmHg. Aortic valve peak gradient measures 14.4 mmHg. Aortic valve area, by VTI measures 1.63 cm. Pulmonic Valve: The pulmonic valve was not well visualized. Pulmonic valve regurgitation is not visualized. Aorta: The aortic root is normal in size and structure. Venous: The inferior vena cava is normal in size with greater than 50% respiratory variability, suggesting right atrial pressure of 3 mmHg. IAS/Shunts: The interatrial septum was not well visualized.  LEFT VENTRICLE PLAX 2D LVIDd:         4.60 cm  Diastology LVIDs:         3.10 cm  LV e' medial:    5.43 cm/s LV PW:          0.70 cm  LV E/e' medial:  14.2 LV IVS:        0.90 cm  LV e' lateral:   7.62 cm/s LVOT diam:     1.70 cm  LV E/e' lateral: 10.1 LV SV:         58 LV SV Index:   32 LVOT Area:     2.27 cm  RIGHT VENTRICLE RV S prime:     12.30 cm/s TAPSE (M-mode): 1.7 cm LEFT ATRIUM             Index       RIGHT ATRIUM           Index LA diam:        3.00 cm 1.65 cm/m  RA Area:     14.50 cm LA Vol (A2C):   20.8 ml 11.41 ml/m RA Volume:   39.40 ml  21.62 ml/m LA Vol (A4C):   22.4 ml 12.29 ml/m LA Biplane Vol: 21.6 ml 11.85 ml/m  AORTIC VALVE AV Area (Vmax):    1.82 cm AV Area (Vmean):   1.77 cm AV Area (VTI):     1.63 cm AV Vmax:           190.00 cm/s AV Vmean:          142.000 cm/s AV VTI:            0.357 m AV Peak Grad:      14.4 mmHg AV Mean Grad:      9.0 mmHg LVOT Vmax:         152.00 cm/s LVOT Vmean:        111.000 cm/s LVOT VTI:          0.257 m LVOT/AV VTI ratio: 0.72 AI PHT:            404 msec  AORTA Ao Root diam: 3.20 cm Ao Asc diam:  3.10 cm MITRAL VALVE               TRICUSPID VALVE MV Area (PHT): 2.99 cm    TR Peak grad:   24.6 mmHg MV Decel Time: 254 msec    TR Vmax:        248.00 cm/s MV E velocity: 77.10 cm/s MV A velocity: 81.00 cm/s  SHUNTS MV E/A ratio:  0.95        Systemic VTI:  0.26 m                            Systemic Diam: 1.70 cm Weston Brass MD Electronically signed by Weston Brass MD Signature Date/Time: 11/14/2020/1:19:33 PM    Final

## 2020-11-29 NOTE — Progress Notes (Signed)
Pt expired at 1634. Pt's sig other Erica at bedside. Dr. Thedore Mins notified. Post mortem care done and all lines, tubes, dressings etc have been removed. Will transport pt to the morgue.

## 2020-11-29 NOTE — Progress Notes (Signed)
Pt s/o Alcario Drought) spoken to over the phone and given update.

## 2020-11-29 NOTE — Progress Notes (Signed)
Palliative: Mr. Gulbranson is lying quietly in bed.  He is COVID-positive, and appears acutely/chronically ill and very frail.  His brother has elected for comfort care, let nature take its course.  Mr. Head appears to be actively dying, and has a morphine continuous infusion to reduce his suffering.  He is having extended periods of apnea.  There is no family at bedside at this time due to visitor restrictions.  Brother Jonny Ruiz declined to be present at this time due to COVID restrictions.  Conference with attending, bedside nursing staff, transition of care team related to patient condition, needs, symptom management.  Plan: Full comfort care. Prognosis: Hours to days, anticipate in-hospital death.  Too unstable for transportation to hospice facility, also limited choices for residential hospice due to COVID-positive status.  35 minutes Lillia Carmel, NP Palliative medicine team Team phone [phone] Greater than 50% of this time was spent counseling and coordinating care related to the above assessment and plan.

## 2020-11-29 DEATH — deceased

## 2020-12-20 NOTE — Telephone Encounter (Signed)
error 

## 2021-07-25 IMAGING — MR MR ABDOMEN WO/W CM
17 series · 48 of 48 positions shown · IV contrast (Gadavist)
Comparison: CT AP 09/01/2020

CLINICAL DATA: Evaluate kidney mass

EXAM:
MRI ABDOMEN WITHOUT AND WITH CONTRAST
TECHNIQUE: Multiplanar multisequence MR imaging of the abdomen was performed
both before and after the administration of intravenous contrast.
CONTRAST:  6.5mL GADAVIST GADOBUTROL 1 MMOL/ML IV SOLN

[Series 4: cor haste · coronal · 6.0mm · 1.19mm/px · 2 of 30 slices shown]
[im 1/30]
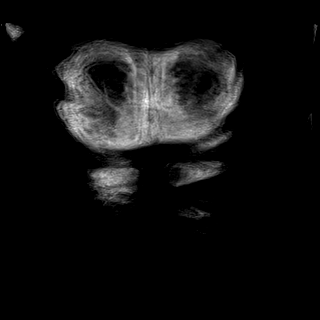
[im 30/30]
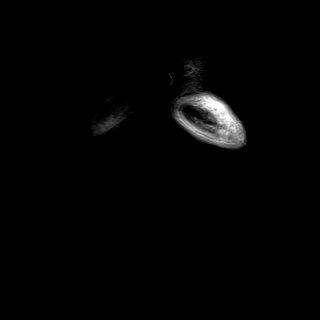

[Series 5: ax haste · axial · 6.0mm · 1.19mm/px · z∈[-206,+39]mm · 2 of 35 slices shown]
[im 1/35]
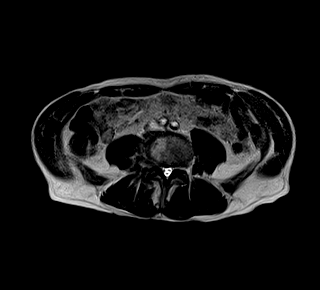
[im 35/35]
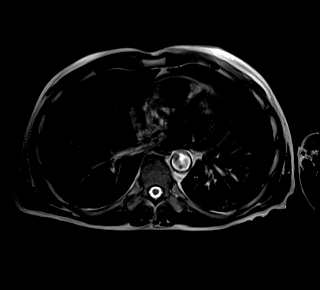

[Series 11: T2 fat-sat · axial · 6.0mm · 1.19mm/px · z∈[-206,+39]mm · 2 of 35 slices shown]
[im 1/35]
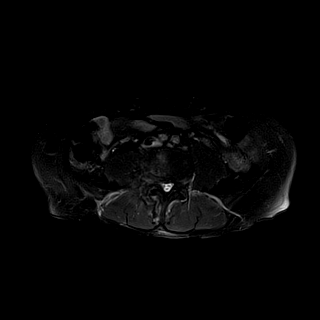
[im 35/35]
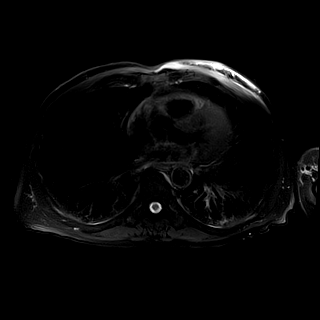

[Series 13: DWI · axial · 6.0mm · 1.42mm/px · z∈[-223,+36]mm · 5 of 111 slices shown (1 of 2)]
[im 1/111]
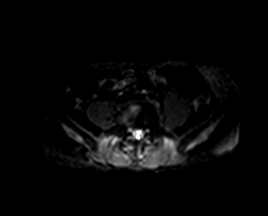
[im 28/111]
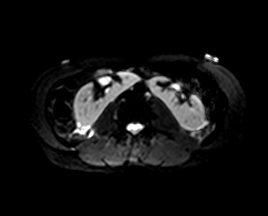
[im 56/111]
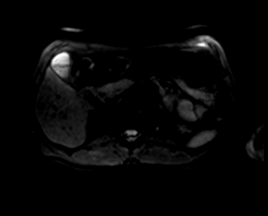
[im 83/111]
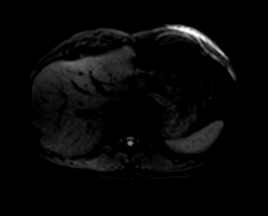
[im 111/111]
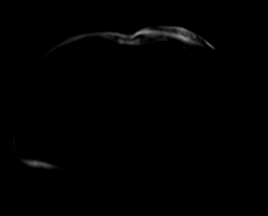

[Series 14: DWI · axial · 6.0mm · 1.42mm/px · 1 of 37 slices shown (2 of 2)]
[im 1/37]
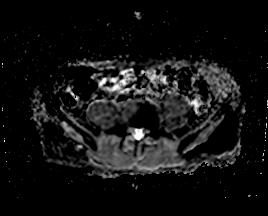

[Series 15: t1_vibe_opp-in_tra_p4_bh · axial · 3.0mm · 1.19mm/px · z∈[-219,+18]mm · 3 of 80 slices shown (1 of 2)]
[im 1/80]
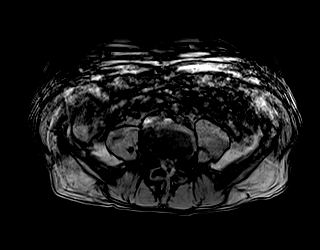
[im 40/80]
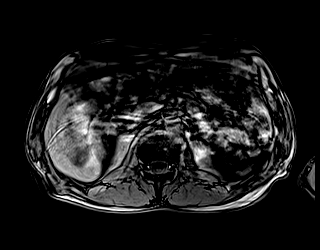
[im 80/80]
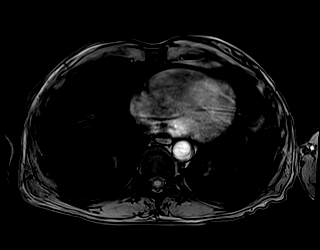

[Series 15: t1_vibe_opp-in_tra_p4_bh · axial · 3.0mm · 1.19mm/px · z∈[-219,+18]mm · 3 of 80 slices shown (2 of 2)]
[im 1/80]
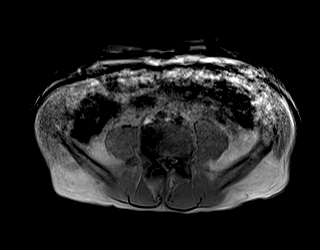
[im 40/80]
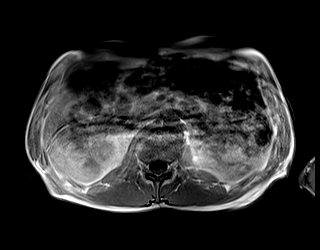
[im 80/80]
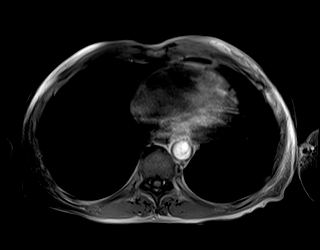

[Series 20: t1_vibe_fs_tra_p4_bh_pre · axial · 3.0mm · 1.19mm/px · z∈[-220,+41]mm · 3 of 88 slices shown]
[im 1/88]
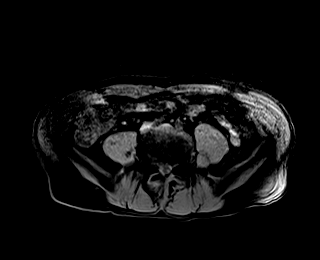
[im 44/88]
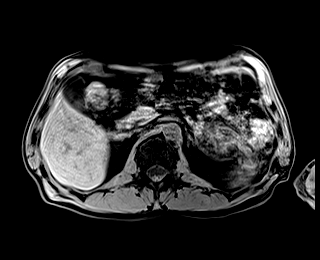
[im 88/88]
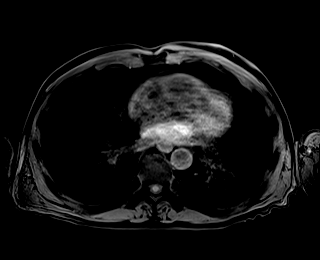

[Series 22: t1_vibe_fs_tra_p4_bh_post · axial · 3.0mm · 1.19mm/px · z∈[-220,+41]mm · 3 of 88 slices shown (1 of 4)]
[im 1/88]
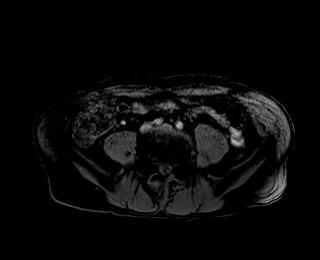
[im 44/88]
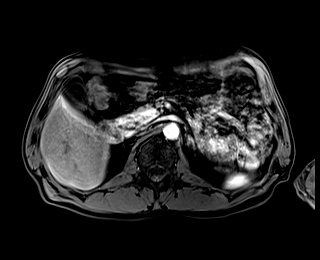
[im 88/88]
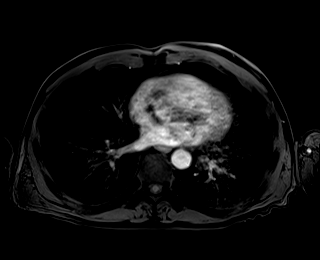

[Series 23: t1_vibe_fs_tra_p4_bh_post · axial · 3.0mm · 1.19mm/px · z∈[-220,+41]mm · 3 of 88 slices shown (2 of 4)]
[im 1/88]
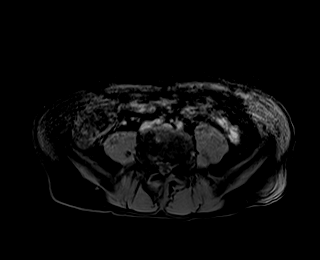
[im 44/88]
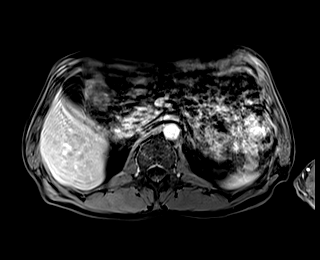
[im 88/88]
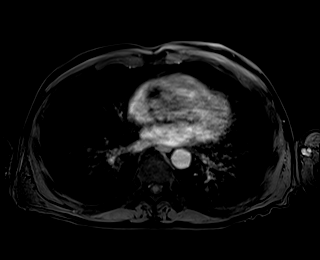

[Series 24: t1_vibe_fs_tra_p4_bh_post · axial · 3.0mm · 1.19mm/px · z∈[-220,+41]mm · 3 of 88 slices shown (3 of 4)]
[im 1/88]
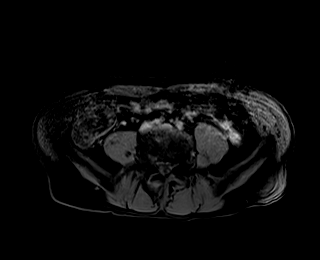
[im 44/88]
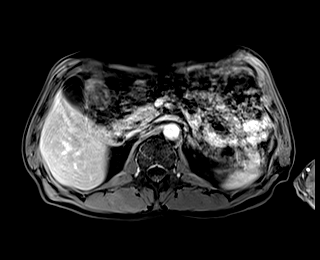
[im 88/88]
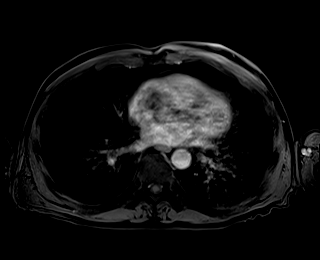

[Series 25: t1_vibe_fs_tra_p4_bh_post · axial · 3.0mm · 1.19mm/px · z∈[-220,+41]mm · 3 of 88 slices shown (4 of 4)]
[im 1/88]
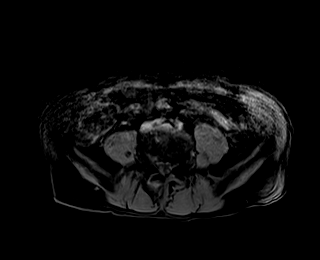
[im 44/88]
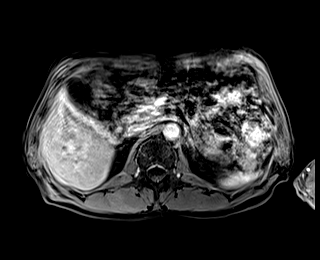
[im 88/88]
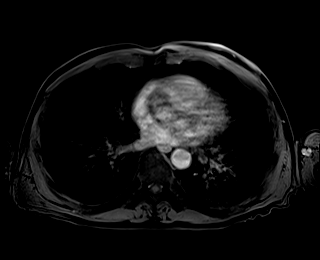

[Series 26: T1 dynamic post-contrast · coronal · 3.0mm · 1.31mm/px · 3 of 72 slices shown]
[im 1/72]
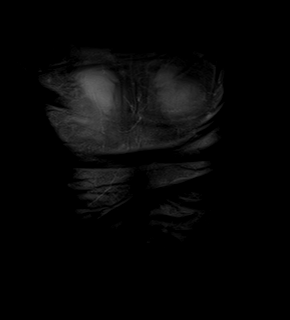
[im 36/72]
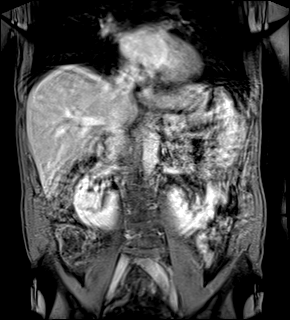
[im 72/72]
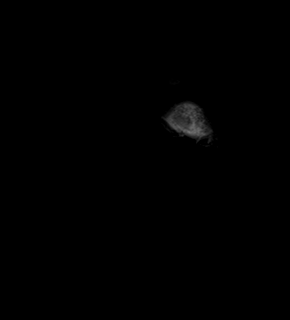

[Series 1038: results sub_t1_vibe_fs_tra_p4_bh_(id) · axial · 3.0mm · 1.19mm/px · z∈[-220,+41]mm · 3 of 88 slices shown (1 of 4)]
[im 1/88]
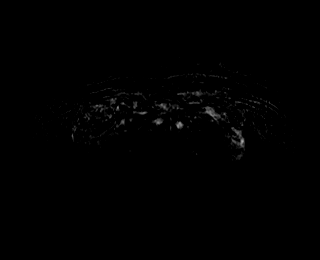
[im 44/88]
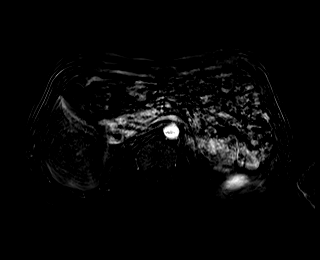
[im 88/88]
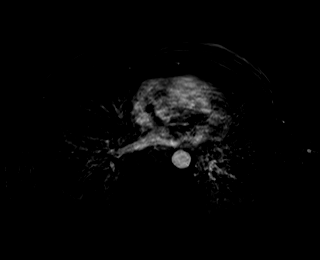

[Series 1042: results sub_t1_vibe_fs_tra_p4_bh_(id) · axial · 3.0mm · 1.19mm/px · z∈[-220,+41]mm · 3 of 88 slices shown (2 of 4)]
[im 1/88]
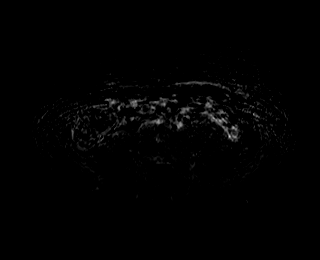
[im 44/88]
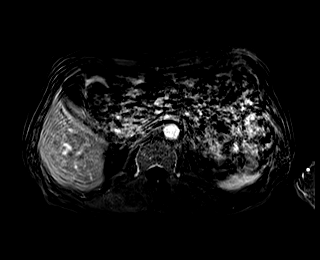
[im 88/88]
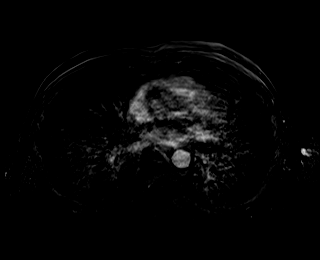

[Series 1047: results sub_t1_vibe_fs_tra_p4_bh_(id) · axial · 3.0mm · 1.19mm/px · z∈[-220,+41]mm · 3 of 88 slices shown (3 of 4)]
[im 1/88]
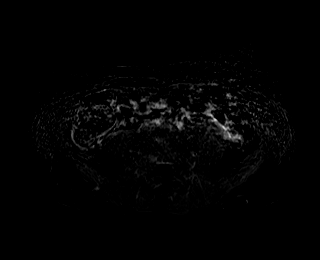
[im 44/88]
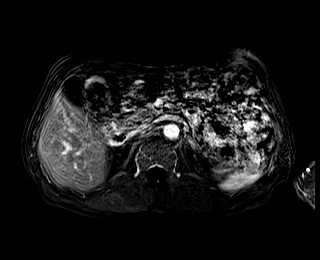
[im 88/88]
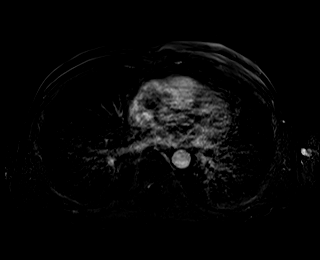

[Series 1052: results sub_t1_vibe_fs_tra_p4_bh_(id) · axial · 3.0mm · 1.19mm/px · z∈[-220,+41]mm · 3 of 88 slices shown (4 of 4)]
[im 1/88]
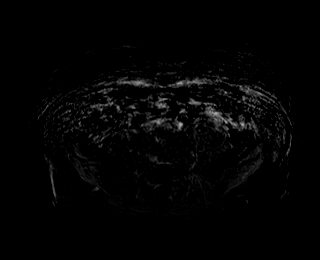
[im 44/88]
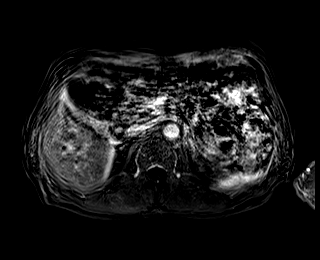
[im 88/88]
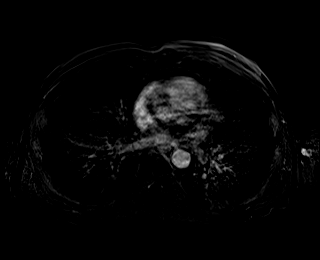

[48 of 48 positions shown; findings below may reference images not displayed]

FINDINGS: Lower chest: No acute findings.

Hepatobiliary: No suspicious liver abnormality identified. Stone
within the gallbladder neck measures 1 cm, image [DATE]. No signs of
gallbladder wall inflammation or biliary ductal dilatation.

Pancreas:  No main duct dilatation, inflammation or mass.

Spleen:  Within normal limits in size and appearance.

Adrenals/Urinary Tract:  Adrenal glands are unremarkable.

Horseshoe kidney identified.

Corresponding to the CT abnormality is a well-circumscribed T2
hyperintense lesion arising off the anteromedial cortex of the left
kidney measuring 1.1 x 0.9 cm. No internal septation or mural nodule
noted. On the postcontrast images this lesion appears blurred by
respiratory motion artifact limiting evaluation for evidence of
internal enhancement/complexity. No additional focal kidney lesions
identified.

Mild bilateral perinephric fat stranding is identified which is
unchanged from previous exam and may be related to prior insult. No
hydronephrosis.

Stomach/Bowel: Visualized portions within the abdomen are
unremarkable.

Vascular/Lymphatic:  No aneurysm.  No abdominal adenopathy.

Other:  No significant free fluid or fluid collections.

Musculoskeletal: No suspicious bone lesions identified.

Note: Diminished exam detail secondary to respiratory motion
artifact.
IMPRESSION: 1. Diminished exam detail due to respiratory motion artifact.
2. Horseshoe kidney.
3. Corresponding to the CT abnormality is a unilocular, uniformly T2
hyperintense cyst arising off the anteromedial cortex of the left
kidney. No signs of internal complexity on the precontrast T2
weighted images. This is obscured on the postcontrast imaging
limiting assessment for internal enhancement. Given the small size
of this lesion and the uniformly T2 hyperintense this is favored to
represent a simple cyst but is technically incompletely
characterized. Consider follow-up imaging and 6 to 12 months with
renal protocol MRI or CT without and with contrast material. In a
patient who may have difficulty remaining motionless, CT may be more
appropriate as this is less susceptible to motion artifact.
4. Horseshoe kidney.
5. Gallstone.

## 2021-10-08 IMAGING — DX DG ABD PORTABLE 1V
1 series · 1 of 1 positions shown · non-contrast
Comparison: Portable exam 9401 hours compared to 08/15/2009

CLINICAL DATA: Encounter for feeding tube placement

EXAM:
PORTABLE ABDOMEN - 1 VIEW

[abdomen supine]
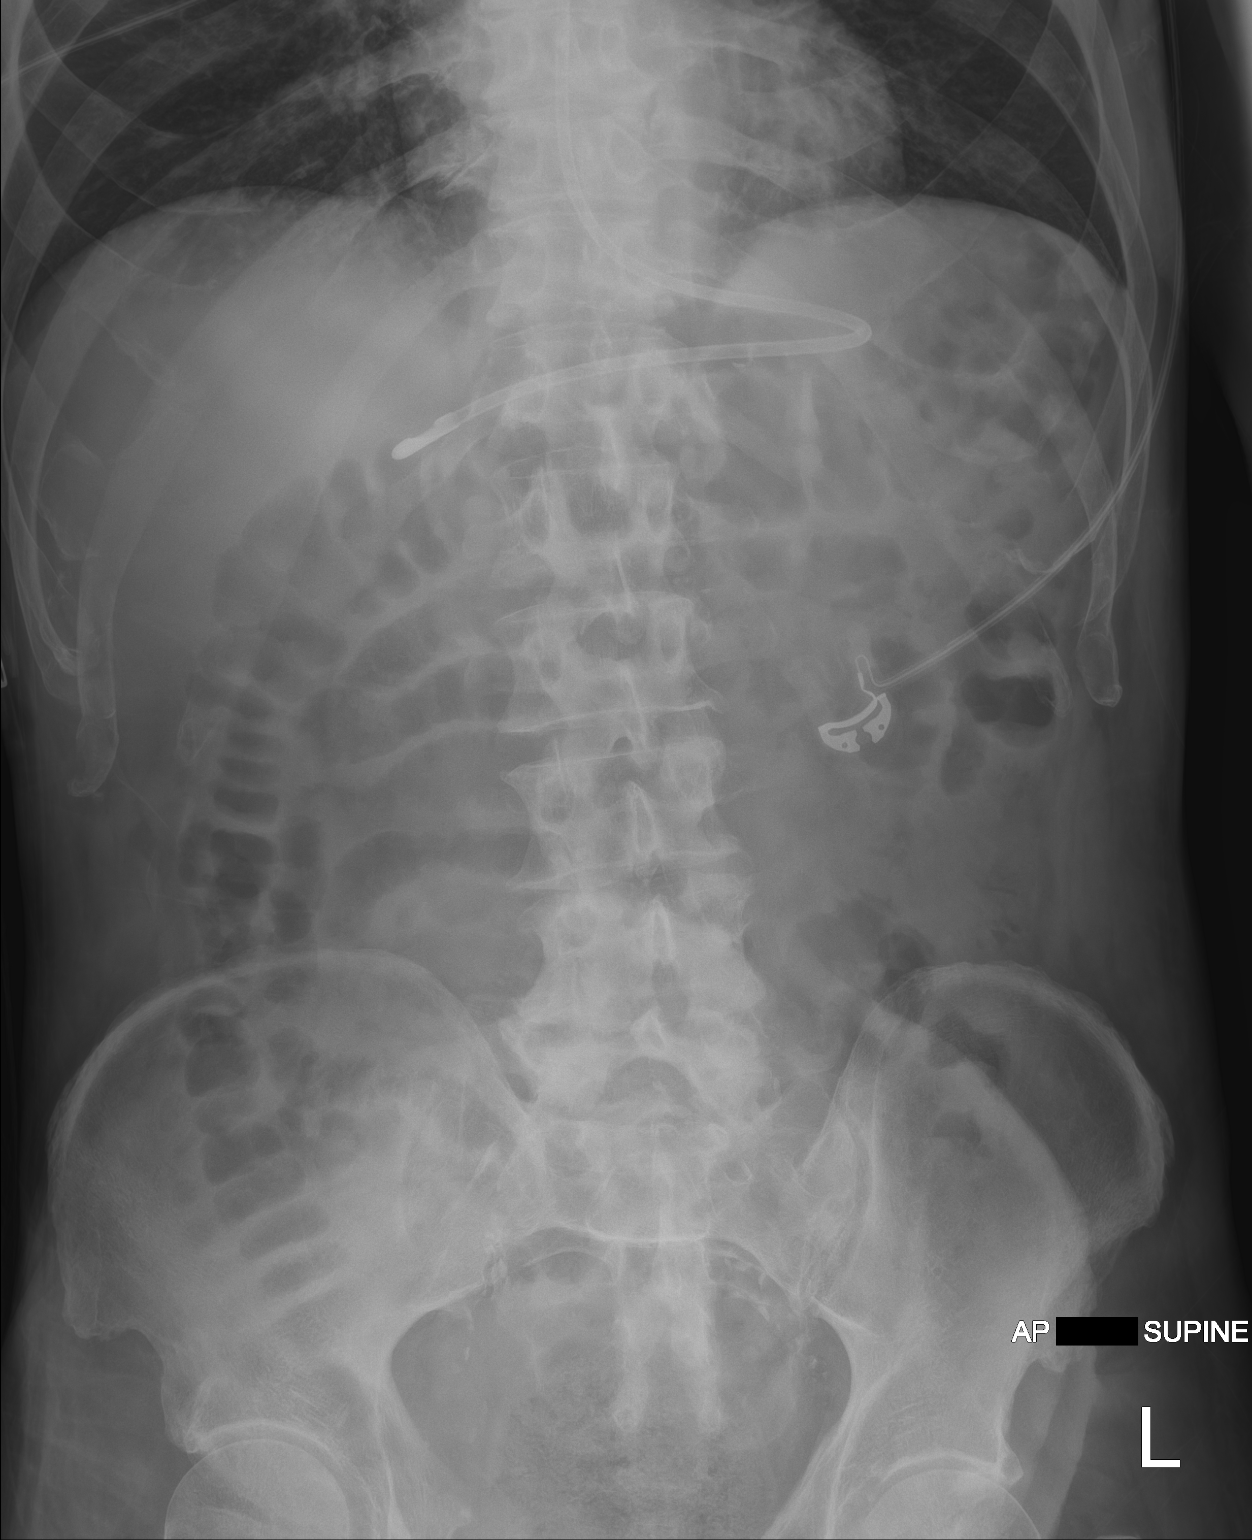

[1 of 1 positions shown; findings below may reference images not displayed]

FINDINGS: Tip of feeding tube projects over distal gastric antrum or pylorus.

Nonobstructive bowel gas pattern.

No bowel dilatation or bowel wall thickening.

Osseous structures unremarkable.
IMPRESSION: Tip of feeding tube projects over distal gastric antrum or pylorus.
